# Patient Record
Sex: Female | Born: 1940
Health system: Southern US, Community
[De-identification: ages and names within clinical notes are randomized; demographics above are authoritative.]

## PROBLEM LIST (undated history)

## (undated) DIAGNOSIS — F419 Anxiety disorder, unspecified: Secondary | ICD-10-CM

## (undated) DIAGNOSIS — F329 Major depressive disorder, single episode, unspecified: Secondary | ICD-10-CM

## (undated) DIAGNOSIS — J189 Pneumonia, unspecified organism: Secondary | ICD-10-CM

## (undated) DIAGNOSIS — K449 Diaphragmatic hernia without obstruction or gangrene: Secondary | ICD-10-CM

## (undated) DIAGNOSIS — E785 Hyperlipidemia, unspecified: Secondary | ICD-10-CM

## (undated) DIAGNOSIS — G4730 Sleep apnea, unspecified: Secondary | ICD-10-CM

## (undated) DIAGNOSIS — R053 Chronic cough: Secondary | ICD-10-CM

## (undated) DIAGNOSIS — E669 Obesity, unspecified: Secondary | ICD-10-CM

## (undated) DIAGNOSIS — F32A Depression, unspecified: Secondary | ICD-10-CM

## (undated) DIAGNOSIS — E039 Hypothyroidism, unspecified: Secondary | ICD-10-CM

## (undated) DIAGNOSIS — R911 Solitary pulmonary nodule: Secondary | ICD-10-CM

## (undated) DIAGNOSIS — I10 Essential (primary) hypertension: Secondary | ICD-10-CM

## (undated) DIAGNOSIS — Z91018 Allergy to other foods: Secondary | ICD-10-CM

## (undated) DIAGNOSIS — R05 Cough: Secondary | ICD-10-CM

## (undated) DIAGNOSIS — E119 Type 2 diabetes mellitus without complications: Secondary | ICD-10-CM

## (undated) DIAGNOSIS — K219 Gastro-esophageal reflux disease without esophagitis: Secondary | ICD-10-CM

## (undated) DIAGNOSIS — K635 Polyp of colon: Secondary | ICD-10-CM

## (undated) DIAGNOSIS — M1990 Unspecified osteoarthritis, unspecified site: Secondary | ICD-10-CM

## (undated) DIAGNOSIS — K5790 Diverticulosis of intestine, part unspecified, without perforation or abscess without bleeding: Secondary | ICD-10-CM

## (undated) DIAGNOSIS — R079 Chest pain, unspecified: Secondary | ICD-10-CM

## (undated) DIAGNOSIS — M2550 Pain in unspecified joint: Secondary | ICD-10-CM

## (undated) DIAGNOSIS — R0602 Shortness of breath: Secondary | ICD-10-CM

## (undated) HISTORY — DX: Pneumonia, unspecified organism: J18.9

## (undated) HISTORY — DX: Sleep apnea, unspecified: G47.30

## (undated) HISTORY — DX: Pain in unspecified joint: M25.50

## (undated) HISTORY — DX: Unspecified osteoarthritis, unspecified site: M19.90

## (undated) HISTORY — DX: Chronic cough: R05.3

## (undated) HISTORY — DX: Depression, unspecified: F32.A

## (undated) HISTORY — PX: ABDOMINAL HYSTERECTOMY: SHX81

## (undated) HISTORY — PX: CHOLECYSTECTOMY: SHX55

## (undated) HISTORY — DX: Major depressive disorder, single episode, unspecified: F32.9

## (undated) HISTORY — DX: Obesity, unspecified: E66.9

## (undated) HISTORY — DX: Gastro-esophageal reflux disease without esophagitis: K21.9

## (undated) HISTORY — DX: Polyp of colon: K63.5

## (undated) HISTORY — DX: Type 2 diabetes mellitus without complications: E11.9

## (undated) HISTORY — DX: Allergy to other foods: Z91.018

## (undated) HISTORY — PX: NISSEN FUNDOPLICATION: SHX2091

## (undated) HISTORY — DX: Cough: R05

## (undated) HISTORY — DX: Solitary pulmonary nodule: R91.1

## (undated) HISTORY — DX: Anxiety disorder, unspecified: F41.9

## (undated) HISTORY — DX: Shortness of breath: R06.02

## (undated) HISTORY — PX: BREAST EXCISIONAL BIOPSY: SUR124

## (undated) HISTORY — DX: Diaphragmatic hernia without obstruction or gangrene: K44.9

## (undated) HISTORY — DX: Hypothyroidism, unspecified: E03.9

## (undated) HISTORY — DX: Chest pain, unspecified: R07.9

## (undated) HISTORY — PX: APPENDECTOMY: SHX54

## (undated) HISTORY — DX: Diverticulosis of intestine, part unspecified, without perforation or abscess without bleeding: K57.90

## (undated) HISTORY — PX: OTHER SURGICAL HISTORY: SHX169

## (undated) HISTORY — PX: VENTRAL HERNIA REPAIR: SHX424

## (undated) HISTORY — DX: Essential (primary) hypertension: I10

## (undated) HISTORY — PX: BLADDER SUSPENSION: SHX72

## (undated) HISTORY — DX: Hyperlipidemia, unspecified: E78.5

## (undated) HISTORY — PX: RECTOCELE REPAIR: SHX761

## (undated) HISTORY — PX: TONSILLECTOMY: SUR1361

---

## 2004-02-01 ENCOUNTER — Ambulatory Visit: Payer: Self-pay | Admitting: Family Medicine

## 2004-02-07 ENCOUNTER — Encounter: Admission: RE | Admit: 2004-02-07 | Discharge: 2004-03-07 | Payer: Self-pay | Admitting: Family Medicine

## 2004-02-11 ENCOUNTER — Ambulatory Visit: Payer: Self-pay | Admitting: Internal Medicine

## 2004-03-15 ENCOUNTER — Ambulatory Visit: Payer: Self-pay | Admitting: Internal Medicine

## 2004-03-27 ENCOUNTER — Ambulatory Visit: Payer: Self-pay | Admitting: Professional

## 2004-04-03 ENCOUNTER — Ambulatory Visit: Payer: Self-pay | Admitting: Professional

## 2004-04-13 ENCOUNTER — Ambulatory Visit: Payer: Self-pay | Admitting: Professional

## 2004-04-17 ENCOUNTER — Ambulatory Visit: Payer: Self-pay | Admitting: Professional

## 2004-04-25 ENCOUNTER — Ambulatory Visit: Payer: Self-pay | Admitting: Family Medicine

## 2004-04-25 ENCOUNTER — Ambulatory Visit: Payer: Self-pay | Admitting: Professional

## 2004-05-01 ENCOUNTER — Ambulatory Visit: Payer: Self-pay | Admitting: Professional

## 2004-05-03 ENCOUNTER — Ambulatory Visit: Payer: Self-pay | Admitting: Family Medicine

## 2004-05-05 ENCOUNTER — Ambulatory Visit: Payer: Self-pay | Admitting: Family Medicine

## 2004-07-20 ENCOUNTER — Ambulatory Visit: Payer: Self-pay | Admitting: Professional

## 2004-07-24 ENCOUNTER — Ambulatory Visit: Payer: Self-pay | Admitting: Professional

## 2004-08-03 ENCOUNTER — Ambulatory Visit: Payer: Self-pay | Admitting: Professional

## 2004-08-07 ENCOUNTER — Ambulatory Visit: Payer: Self-pay | Admitting: Professional

## 2004-08-21 ENCOUNTER — Ambulatory Visit: Payer: Self-pay | Admitting: Professional

## 2004-08-28 ENCOUNTER — Ambulatory Visit: Payer: Self-pay | Admitting: Professional

## 2004-09-04 ENCOUNTER — Ambulatory Visit: Payer: Self-pay | Admitting: Professional

## 2004-09-11 ENCOUNTER — Ambulatory Visit: Payer: Self-pay | Admitting: Professional

## 2004-09-26 ENCOUNTER — Ambulatory Visit: Payer: Self-pay | Admitting: Professional

## 2004-09-26 ENCOUNTER — Ambulatory Visit: Payer: Self-pay | Admitting: Family Medicine

## 2004-10-03 ENCOUNTER — Ambulatory Visit: Payer: Self-pay | Admitting: Family Medicine

## 2004-10-16 ENCOUNTER — Ambulatory Visit: Payer: Self-pay | Admitting: Professional

## 2004-11-06 ENCOUNTER — Ambulatory Visit: Payer: Self-pay | Admitting: Professional

## 2004-11-27 ENCOUNTER — Ambulatory Visit: Payer: Self-pay | Admitting: Internal Medicine

## 2004-12-08 ENCOUNTER — Ambulatory Visit: Payer: Self-pay | Admitting: Internal Medicine

## 2004-12-21 ENCOUNTER — Ambulatory Visit: Payer: Self-pay | Admitting: Internal Medicine

## 2005-01-11 ENCOUNTER — Encounter: Admission: RE | Admit: 2005-01-11 | Discharge: 2005-01-11 | Payer: Self-pay | Admitting: Internal Medicine

## 2005-01-22 ENCOUNTER — Ambulatory Visit: Payer: Self-pay | Admitting: Internal Medicine

## 2005-01-22 ENCOUNTER — Ambulatory Visit: Payer: Self-pay | Admitting: Professional

## 2005-03-06 ENCOUNTER — Ambulatory Visit: Payer: Self-pay | Admitting: Internal Medicine

## 2005-03-26 ENCOUNTER — Ambulatory Visit: Payer: Self-pay | Admitting: Internal Medicine

## 2005-03-30 ENCOUNTER — Ambulatory Visit: Payer: Self-pay | Admitting: Internal Medicine

## 2005-04-03 ENCOUNTER — Ambulatory Visit: Payer: Self-pay | Admitting: Internal Medicine

## 2005-04-12 ENCOUNTER — Encounter: Admission: RE | Admit: 2005-04-12 | Discharge: 2005-07-11 | Payer: Self-pay | Admitting: Internal Medicine

## 2005-06-14 ENCOUNTER — Ambulatory Visit: Payer: Self-pay | Admitting: Internal Medicine

## 2005-06-14 ENCOUNTER — Encounter (INDEPENDENT_AMBULATORY_CARE_PROVIDER_SITE_OTHER): Payer: Self-pay | Admitting: Specialist

## 2005-06-14 ENCOUNTER — Ambulatory Visit (HOSPITAL_COMMUNITY): Admission: RE | Admit: 2005-06-14 | Discharge: 2005-06-14 | Payer: Self-pay | Admitting: Internal Medicine

## 2005-06-14 ENCOUNTER — Inpatient Hospital Stay (HOSPITAL_COMMUNITY): Admission: EM | Admit: 2005-06-14 | Discharge: 2005-06-16 | Payer: Self-pay | Admitting: Emergency Medicine

## 2005-07-11 ENCOUNTER — Encounter: Admission: RE | Admit: 2005-07-11 | Discharge: 2005-07-11 | Payer: Self-pay | Admitting: Surgery

## 2005-08-04 ENCOUNTER — Emergency Department (HOSPITAL_COMMUNITY): Admission: EM | Admit: 2005-08-04 | Discharge: 2005-08-04 | Payer: Self-pay | Admitting: Emergency Medicine

## 2005-08-16 ENCOUNTER — Ambulatory Visit: Payer: Self-pay | Admitting: Internal Medicine

## 2005-09-04 ENCOUNTER — Ambulatory Visit: Payer: Self-pay | Admitting: Internal Medicine

## 2005-09-25 ENCOUNTER — Ambulatory Visit: Payer: Self-pay | Admitting: Internal Medicine

## 2005-10-25 ENCOUNTER — Ambulatory Visit: Payer: Self-pay | Admitting: Internal Medicine

## 2005-11-16 ENCOUNTER — Ambulatory Visit: Payer: Self-pay | Admitting: Internal Medicine

## 2006-01-07 ENCOUNTER — Ambulatory Visit: Payer: Self-pay | Admitting: Internal Medicine

## 2006-01-15 ENCOUNTER — Encounter: Admission: RE | Admit: 2006-01-15 | Discharge: 2006-01-15 | Payer: Self-pay | Admitting: Internal Medicine

## 2006-04-10 ENCOUNTER — Ambulatory Visit: Payer: Self-pay | Admitting: Internal Medicine

## 2006-04-10 LAB — CONVERTED CEMR LAB
CO2: 33 meq/L — ABNORMAL HIGH (ref 19–32)
Calcium: 9.7 mg/dL (ref 8.4–10.5)
Chloride: 105 meq/L (ref 96–112)
Eosinophils Absolute: 0.2 10*3/uL (ref 0.0–0.6)
Eosinophils Relative: 3 % (ref 0.0–5.0)
Glucose, Bld: 115 mg/dL — ABNORMAL HIGH (ref 70–99)
HCT: 35.4 % — ABNORMAL LOW (ref 36.0–46.0)
Lymphocytes Relative: 38.9 % (ref 12.0–46.0)
MCV: 88.2 fL (ref 78.0–100.0)
Monocytes Absolute: 0.5 10*3/uL (ref 0.2–0.7)
Neutro Abs: 3.1 10*3/uL (ref 1.4–7.7)
Neutrophils Relative %: 47.9 % (ref 43.0–77.0)
WBC: 6.5 10*3/uL (ref 4.5–10.5)

## 2006-04-19 ENCOUNTER — Encounter (INDEPENDENT_AMBULATORY_CARE_PROVIDER_SITE_OTHER): Payer: Self-pay | Admitting: *Deleted

## 2006-04-19 ENCOUNTER — Encounter: Admission: RE | Admit: 2006-04-19 | Discharge: 2006-04-19 | Payer: Self-pay | Admitting: Surgery

## 2006-06-06 ENCOUNTER — Encounter (INDEPENDENT_AMBULATORY_CARE_PROVIDER_SITE_OTHER): Payer: Self-pay | Admitting: *Deleted

## 2006-06-06 ENCOUNTER — Ambulatory Visit (HOSPITAL_COMMUNITY): Admission: RE | Admit: 2006-06-06 | Discharge: 2006-06-07 | Payer: Self-pay | Admitting: Surgery

## 2006-07-30 ENCOUNTER — Ambulatory Visit: Payer: Self-pay | Admitting: Internal Medicine

## 2006-08-27 ENCOUNTER — Ambulatory Visit: Payer: Self-pay | Admitting: Internal Medicine

## 2006-08-28 ENCOUNTER — Encounter: Admission: RE | Admit: 2006-08-28 | Discharge: 2006-09-17 | Payer: Self-pay | Admitting: Orthopedic Surgery

## 2006-09-02 ENCOUNTER — Ambulatory Visit: Payer: Self-pay | Admitting: Internal Medicine

## 2006-10-01 ENCOUNTER — Ambulatory Visit: Payer: Self-pay | Admitting: Internal Medicine

## 2006-10-01 ENCOUNTER — Encounter: Payer: Self-pay | Admitting: Internal Medicine

## 2006-10-01 LAB — CONVERTED CEMR LAB
Albumin: 4 g/dL (ref 3.5–5.2)
Basophils Absolute: 0.3 10*3/uL — ABNORMAL HIGH (ref 0.0–0.1)
Bilirubin, Direct: 0.1 mg/dL (ref 0.0–0.3)
Chloride: 107 meq/L (ref 96–112)
Cholesterol: 197 mg/dL (ref 0–200)
Eosinophils Absolute: 0.2 10*3/uL (ref 0.0–0.6)
GFR calc Af Amer: 71 mL/min
GFR calc non Af Amer: 59 mL/min
Glucose, Bld: 103 mg/dL — ABNORMAL HIGH (ref 70–99)
HCT: 34.7 % — ABNORMAL LOW (ref 36.0–46.0)
HDL: 52 mg/dL (ref >39.0)
Hemoglobin, Urine: NEGATIVE
Ketones, ur: NEGATIVE mg/dL
LDL Cholesterol: 130 mg/dL — ABNORMAL HIGH (ref 0–99)
Lymphocytes Relative: 38 % (ref 12.0–46.0)
MCHC: 34 g/dL (ref 30.0–36.0)
MCV: 90.6 fL (ref 78.0–100.0)
Monocytes Absolute: 0.5 10*3/uL (ref 0.2–0.7)
Neutro Abs: 2.8 10*3/uL (ref 1.4–7.7)
Neutrophils Relative %: 45.2 % (ref 43.0–77.0)
Potassium: 3.4 meq/L — ABNORMAL LOW (ref 3.5–5.1)
RBC: 3.83 M/uL — ABNORMAL LOW (ref 3.87–5.11)
Sodium: 147 meq/L — ABNORMAL HIGH (ref 135–145)
TSH: 4.91 microintl units/mL (ref 0.35–5.50)
Total CHOL/HDL Ratio: 3.8
Urobilinogen, UA: 0.2 (ref 0.0–1.0)

## 2006-10-16 ENCOUNTER — Ambulatory Visit: Payer: Self-pay | Admitting: Internal Medicine

## 2006-11-19 ENCOUNTER — Encounter: Admission: RE | Admit: 2006-11-19 | Discharge: 2006-11-19 | Payer: Self-pay | Admitting: Orthopedic Surgery

## 2006-11-30 ENCOUNTER — Encounter: Payer: Self-pay | Admitting: Internal Medicine

## 2006-12-21 DIAGNOSIS — E039 Hypothyroidism, unspecified: Secondary | ICD-10-CM

## 2006-12-21 DIAGNOSIS — I10 Essential (primary) hypertension: Secondary | ICD-10-CM

## 2007-01-16 ENCOUNTER — Telehealth: Payer: Self-pay | Admitting: Internal Medicine

## 2007-01-20 ENCOUNTER — Encounter: Payer: Self-pay | Admitting: Internal Medicine

## 2007-01-20 ENCOUNTER — Encounter: Admission: RE | Admit: 2007-01-20 | Discharge: 2007-01-20 | Payer: Self-pay | Admitting: Internal Medicine

## 2007-01-28 ENCOUNTER — Ambulatory Visit: Payer: Self-pay | Admitting: Internal Medicine

## 2007-02-19 ENCOUNTER — Ambulatory Visit: Payer: Self-pay | Admitting: Internal Medicine

## 2007-02-19 DIAGNOSIS — E785 Hyperlipidemia, unspecified: Secondary | ICD-10-CM

## 2007-02-19 DIAGNOSIS — J309 Allergic rhinitis, unspecified: Secondary | ICD-10-CM

## 2007-02-19 DIAGNOSIS — E118 Type 2 diabetes mellitus with unspecified complications: Secondary | ICD-10-CM | POA: Insufficient documentation

## 2007-02-19 LAB — CONVERTED CEMR LAB
CO2: 32 meq/L (ref 19–32)
Cholesterol: 189 mg/dL (ref 0–200)
Creatinine, Ser: 0.8 mg/dL (ref 0.4–1.2)
GFR calc Af Amer: 92 mL/min
Glucose, Bld: 104 mg/dL — ABNORMAL HIGH (ref 70–99)
HDL: 62.5 mg/dL (ref >39.0)
Potassium: 4.1 meq/L (ref 3.5–5.1)
Total CHOL/HDL Ratio: 3
Triglycerides: 66 mg/dL (ref 0–149)

## 2007-02-21 ENCOUNTER — Encounter: Payer: Self-pay | Admitting: Internal Medicine

## 2007-03-10 ENCOUNTER — Telehealth: Payer: Self-pay | Admitting: Internal Medicine

## 2007-04-09 ENCOUNTER — Ambulatory Visit: Payer: Self-pay | Admitting: Internal Medicine

## 2007-04-09 DIAGNOSIS — F329 Major depressive disorder, single episode, unspecified: Secondary | ICD-10-CM

## 2007-04-10 ENCOUNTER — Encounter (INDEPENDENT_AMBULATORY_CARE_PROVIDER_SITE_OTHER): Payer: Self-pay | Admitting: *Deleted

## 2007-05-06 ENCOUNTER — Telehealth: Payer: Self-pay | Admitting: Internal Medicine

## 2007-05-13 ENCOUNTER — Ambulatory Visit: Payer: Self-pay | Admitting: Pulmonary Disease

## 2007-05-22 DIAGNOSIS — G4733 Obstructive sleep apnea (adult) (pediatric): Secondary | ICD-10-CM

## 2007-05-28 ENCOUNTER — Encounter: Payer: Self-pay | Admitting: Pulmonary Disease

## 2007-06-03 ENCOUNTER — Encounter: Payer: Self-pay | Admitting: Internal Medicine

## 2007-06-06 ENCOUNTER — Ambulatory Visit: Payer: Self-pay | Admitting: Internal Medicine

## 2007-06-13 ENCOUNTER — Telehealth (INDEPENDENT_AMBULATORY_CARE_PROVIDER_SITE_OTHER): Payer: Self-pay | Admitting: *Deleted

## 2007-07-14 ENCOUNTER — Ambulatory Visit: Payer: Self-pay | Admitting: Pulmonary Disease

## 2007-08-22 ENCOUNTER — Telehealth: Payer: Self-pay | Admitting: Internal Medicine

## 2007-08-22 ENCOUNTER — Ambulatory Visit: Payer: Self-pay | Admitting: Internal Medicine

## 2007-09-02 ENCOUNTER — Encounter: Payer: Self-pay | Admitting: Internal Medicine

## 2007-09-03 ENCOUNTER — Encounter: Payer: Self-pay | Admitting: Pulmonary Disease

## 2007-10-15 ENCOUNTER — Telehealth: Payer: Self-pay | Admitting: Internal Medicine

## 2007-10-20 ENCOUNTER — Ambulatory Visit: Payer: Self-pay | Admitting: Internal Medicine

## 2007-10-20 LAB — CONVERTED CEMR LAB
Albumin: 3.9 g/dL (ref 3.5–5.2)
Alkaline Phosphatase: 58 units/L (ref 39–117)
Bilirubin, Direct: 0.1 mg/dL (ref 0.0–0.3)
LDL Cholesterol: 130 mg/dL — ABNORMAL HIGH (ref 0–99)
Total CHOL/HDL Ratio: 3.4
Total Protein: 7.3 g/dL (ref 6.0–8.3)
VLDL: 10 mg/dL (ref 0–40)

## 2007-10-21 ENCOUNTER — Encounter: Payer: Self-pay | Admitting: Internal Medicine

## 2007-10-27 ENCOUNTER — Ambulatory Visit: Payer: Self-pay | Admitting: Internal Medicine

## 2007-10-27 DIAGNOSIS — E1149 Type 2 diabetes mellitus with other diabetic neurological complication: Secondary | ICD-10-CM | POA: Insufficient documentation

## 2007-10-27 DIAGNOSIS — F411 Generalized anxiety disorder: Secondary | ICD-10-CM | POA: Insufficient documentation

## 2007-10-27 DIAGNOSIS — R519 Headache, unspecified: Secondary | ICD-10-CM | POA: Insufficient documentation

## 2007-10-27 DIAGNOSIS — R51 Headache: Secondary | ICD-10-CM

## 2007-10-30 ENCOUNTER — Telehealth: Payer: Self-pay | Admitting: Internal Medicine

## 2007-12-15 ENCOUNTER — Ambulatory Visit: Payer: Self-pay | Admitting: Internal Medicine

## 2007-12-19 ENCOUNTER — Telehealth: Payer: Self-pay | Admitting: Internal Medicine

## 2008-01-07 ENCOUNTER — Ambulatory Visit: Payer: Self-pay | Admitting: Internal Medicine

## 2008-01-08 ENCOUNTER — Telehealth: Payer: Self-pay | Admitting: Family Medicine

## 2008-01-20 ENCOUNTER — Encounter: Payer: Self-pay | Admitting: Internal Medicine

## 2008-02-10 ENCOUNTER — Encounter: Admission: RE | Admit: 2008-02-10 | Discharge: 2008-02-10 | Payer: Self-pay | Admitting: Internal Medicine

## 2008-02-17 ENCOUNTER — Ambulatory Visit: Payer: Self-pay | Admitting: Internal Medicine

## 2008-02-17 LAB — CONVERTED CEMR LAB
ALT: 24 units/L (ref 0–35)
AST: 23 units/L (ref 0–37)
Alkaline Phosphatase: 66 units/L (ref 39–117)
Basophils Relative: 0.8 % (ref 0.0–3.0)
Bilirubin, Direct: 0.1 mg/dL (ref 0.0–0.3)
CO2: 32 meq/L (ref 19–32)
Calcium: 9.4 mg/dL (ref 8.4–10.5)
Chloride: 99 meq/L (ref 96–112)
Eosinophils Relative: 0.8 % (ref 0.0–5.0)
Folate: >20 ng/mL
Glucose, Bld: 74 mg/dL (ref 70–99)
Hemoglobin, Urine: NEGATIVE
Lymphocytes Relative: 15.6 % (ref 12.0–46.0)
MCV: 90.4 fL (ref 78.0–100.0)
Monocytes Relative: 9 % (ref 3.0–12.0)
Neutrophils Relative %: 73.8 % (ref 43.0–77.0)
Nitrite: NEGATIVE
Platelets: 293 10*3/uL (ref 150–400)
Potassium: 3.5 meq/L (ref 3.5–5.1)
RBC: 3.86 M/uL — ABNORMAL LOW (ref 3.87–5.11)
Sodium: 139 meq/L (ref 135–145)
Specific Gravity, Urine: 1.01 (ref 1.000–1.03)
TSH: 4.06 microintl units/mL (ref 0.35–5.50)
Total Bilirubin: 0.6 mg/dL (ref 0.3–1.2)
Total Protein: 7.4 g/dL (ref 6.0–8.3)
Transferrin: 253.8 mg/dL (ref >=212.0)
Urine Glucose: NEGATIVE mg/dL
Urobilinogen, UA: 0.2 (ref 0.0–1.0)
WBC: 10.6 10*3/uL — ABNORMAL HIGH (ref 4.5–10.5)

## 2008-02-18 ENCOUNTER — Encounter: Payer: Self-pay | Admitting: Internal Medicine

## 2008-02-20 ENCOUNTER — Ambulatory Visit: Payer: Self-pay | Admitting: Internal Medicine

## 2008-02-20 ENCOUNTER — Encounter: Payer: Self-pay | Admitting: Internal Medicine

## 2008-02-20 ENCOUNTER — Telehealth: Payer: Self-pay | Admitting: Internal Medicine

## 2008-02-20 DIAGNOSIS — R911 Solitary pulmonary nodule: Secondary | ICD-10-CM | POA: Insufficient documentation

## 2008-02-24 ENCOUNTER — Telehealth: Payer: Self-pay | Admitting: Internal Medicine

## 2008-02-27 ENCOUNTER — Encounter: Payer: Self-pay | Admitting: Internal Medicine

## 2008-02-27 ENCOUNTER — Ambulatory Visit: Payer: Self-pay | Admitting: Cardiovascular Disease

## 2008-04-15 ENCOUNTER — Telehealth (INDEPENDENT_AMBULATORY_CARE_PROVIDER_SITE_OTHER): Payer: Self-pay | Admitting: *Deleted

## 2008-05-20 ENCOUNTER — Encounter: Payer: Self-pay | Admitting: Internal Medicine

## 2008-05-20 ENCOUNTER — Ambulatory Visit: Payer: Self-pay | Admitting: Internal Medicine

## 2008-05-20 LAB — CONVERTED CEMR LAB
Albumin: 3.9 g/dL (ref 3.5–5.2)
Alkaline Phosphatase: 55 units/L (ref 39–117)
Cholesterol: 226 mg/dL (ref 0–200)
Hgb A1c MFr Bld: 6.4 % — ABNORMAL HIGH (ref 4.6–6.0)
Total Protein: 7.1 g/dL (ref 6.0–8.3)
Triglycerides: 80 mg/dL (ref 0–149)
VLDL: 16 mg/dL (ref 0–40)

## 2008-05-24 ENCOUNTER — Telehealth (INDEPENDENT_AMBULATORY_CARE_PROVIDER_SITE_OTHER): Payer: Self-pay | Admitting: *Deleted

## 2008-05-26 ENCOUNTER — Ambulatory Visit: Payer: Self-pay | Admitting: Internal Medicine

## 2008-05-26 LAB — CONVERTED CEMR LAB: Cholesterol, target level: 200 mg/dL

## 2008-06-03 ENCOUNTER — Telehealth: Payer: Self-pay | Admitting: Internal Medicine

## 2008-06-15 ENCOUNTER — Telehealth: Payer: Self-pay | Admitting: Internal Medicine

## 2008-06-23 ENCOUNTER — Encounter: Payer: Self-pay | Admitting: Internal Medicine

## 2008-06-24 ENCOUNTER — Ambulatory Visit: Payer: Self-pay | Admitting: Internal Medicine

## 2008-06-24 ENCOUNTER — Telehealth: Payer: Self-pay | Admitting: Internal Medicine

## 2008-07-26 ENCOUNTER — Telehealth (INDEPENDENT_AMBULATORY_CARE_PROVIDER_SITE_OTHER): Payer: Self-pay | Admitting: *Deleted

## 2008-08-06 ENCOUNTER — Ambulatory Visit: Payer: Self-pay | Admitting: Internal Medicine

## 2008-08-13 ENCOUNTER — Ambulatory Visit: Payer: Self-pay | Admitting: Critical Care Medicine

## 2008-09-13 ENCOUNTER — Ambulatory Visit: Payer: Self-pay | Admitting: Internal Medicine

## 2008-09-22 ENCOUNTER — Ambulatory Visit: Payer: Self-pay | Admitting: Internal Medicine

## 2008-09-23 ENCOUNTER — Ambulatory Visit: Payer: Self-pay | Admitting: Internal Medicine

## 2008-09-24 LAB — CONVERTED CEMR LAB
Basophils Relative: 0.7 % (ref 0.0–3.0)
Eosinophils Relative: 1.2 % (ref 0.0–5.0)
HCT: 35.9 % — ABNORMAL LOW (ref 36.0–46.0)
Lymphs Abs: 2.3 10*3/uL (ref 0.7–4.0)
MCHC: 34.7 g/dL (ref 30.0–36.0)
MCV: 90.2 fL (ref 78.0–100.0)
Monocytes Absolute: 0.5 10*3/uL (ref 0.1–1.0)
Platelets: 338 10*3/uL (ref 150.0–400.0)
RBC: 3.98 M/uL (ref 3.87–5.11)
WBC: 7.9 10*3/uL (ref 4.5–10.5)

## 2008-10-18 ENCOUNTER — Telehealth (INDEPENDENT_AMBULATORY_CARE_PROVIDER_SITE_OTHER): Payer: Self-pay | Admitting: *Deleted

## 2008-10-21 ENCOUNTER — Telehealth (INDEPENDENT_AMBULATORY_CARE_PROVIDER_SITE_OTHER): Payer: Self-pay | Admitting: *Deleted

## 2008-10-21 DIAGNOSIS — N644 Mastodynia: Secondary | ICD-10-CM

## 2008-10-27 ENCOUNTER — Ambulatory Visit: Payer: Self-pay | Admitting: Internal Medicine

## 2008-11-09 ENCOUNTER — Encounter: Payer: Self-pay | Admitting: Internal Medicine

## 2008-11-26 ENCOUNTER — Telehealth: Payer: Self-pay | Admitting: Internal Medicine

## 2008-12-03 ENCOUNTER — Ambulatory Visit: Payer: Self-pay | Admitting: Internal Medicine

## 2008-12-03 DIAGNOSIS — K219 Gastro-esophageal reflux disease without esophagitis: Secondary | ICD-10-CM

## 2008-12-16 ENCOUNTER — Ambulatory Visit: Payer: Self-pay | Admitting: Internal Medicine

## 2008-12-16 DIAGNOSIS — M25569 Pain in unspecified knee: Secondary | ICD-10-CM

## 2008-12-16 DIAGNOSIS — G8929 Other chronic pain: Secondary | ICD-10-CM | POA: Insufficient documentation

## 2008-12-16 LAB — CONVERTED CEMR LAB
Basophils Absolute: 0.1 10*3/uL (ref 0.0–0.1)
Calcium: 9.2 mg/dL (ref 8.4–10.5)
Eosinophils Absolute: 0.1 10*3/uL (ref 0.0–0.7)
GFR calc non Af Amer: 91.32 mL/min (ref >60)
Glucose, Bld: 105 mg/dL — ABNORMAL HIGH (ref 70–99)
Hgb A1c MFr Bld: 6.2 % (ref 4.6–6.5)
MCHC: 33.7 g/dL (ref 30.0–36.0)
MCV: 92.3 fL (ref 78.0–100.0)
Monocytes Absolute: 0.7 10*3/uL (ref 0.1–1.0)
Neutrophils Relative %: 62.1 % (ref 43.0–77.0)
Platelets: 318 10*3/uL (ref 150.0–400.0)
RDW: 13.6 % (ref 11.5–14.6)
Rhuematoid fact SerPl-aCnc: 21.6 intl units/mL — ABNORMAL HIGH (ref 0.0–20.0)
Sodium: 138 meq/L (ref 135–145)

## 2008-12-20 ENCOUNTER — Telehealth: Payer: Self-pay | Admitting: Internal Medicine

## 2008-12-23 ENCOUNTER — Telehealth: Payer: Self-pay | Admitting: Internal Medicine

## 2009-01-07 ENCOUNTER — Telehealth: Payer: Self-pay | Admitting: Internal Medicine

## 2009-01-12 ENCOUNTER — Ambulatory Visit: Payer: Self-pay | Admitting: Internal Medicine

## 2009-01-12 ENCOUNTER — Encounter: Admission: RE | Admit: 2009-01-12 | Discharge: 2009-01-12 | Payer: Self-pay | Admitting: Internal Medicine

## 2009-01-12 DIAGNOSIS — M129 Arthropathy, unspecified: Secondary | ICD-10-CM | POA: Insufficient documentation

## 2009-01-12 DIAGNOSIS — D126 Benign neoplasm of colon, unspecified: Secondary | ICD-10-CM | POA: Insufficient documentation

## 2009-01-12 LAB — CONVERTED CEMR LAB
Alkaline Phosphatase: 73 units/L (ref 39–117)
Bilirubin, Direct: 0 mg/dL (ref 0.0–0.3)
Cholesterol: 191 mg/dL (ref 0–200)
LDL Cholesterol: 116 mg/dL — ABNORMAL HIGH (ref 0–99)
Total CHOL/HDL Ratio: 3
Total Protein: 7 g/dL (ref 6.0–8.3)

## 2009-01-24 ENCOUNTER — Encounter: Payer: Self-pay | Admitting: Internal Medicine

## 2009-02-03 ENCOUNTER — Ambulatory Visit: Payer: Self-pay | Admitting: Internal Medicine

## 2009-02-14 ENCOUNTER — Telehealth: Payer: Self-pay | Admitting: Internal Medicine

## 2009-02-15 ENCOUNTER — Encounter: Payer: Self-pay | Admitting: Internal Medicine

## 2009-02-17 ENCOUNTER — Ambulatory Visit (HOSPITAL_COMMUNITY): Admission: RE | Admit: 2009-02-17 | Discharge: 2009-02-17 | Payer: Self-pay | Admitting: Internal Medicine

## 2009-02-23 ENCOUNTER — Ambulatory Visit: Payer: Self-pay | Admitting: Internal Medicine

## 2009-02-23 DIAGNOSIS — H6500 Acute serous otitis media, unspecified ear: Secondary | ICD-10-CM

## 2009-03-04 ENCOUNTER — Ambulatory Visit: Payer: Self-pay | Admitting: Internal Medicine

## 2009-03-22 ENCOUNTER — Ambulatory Visit (HOSPITAL_COMMUNITY): Admission: RE | Admit: 2009-03-22 | Discharge: 2009-03-22 | Payer: Self-pay | Admitting: Internal Medicine

## 2009-03-30 ENCOUNTER — Telehealth: Payer: Self-pay | Admitting: Internal Medicine

## 2009-04-11 ENCOUNTER — Telehealth: Payer: Self-pay | Admitting: Internal Medicine

## 2009-04-19 ENCOUNTER — Ambulatory Visit: Payer: Self-pay | Admitting: Internal Medicine

## 2009-04-25 ENCOUNTER — Telehealth: Payer: Self-pay | Admitting: Internal Medicine

## 2009-04-25 ENCOUNTER — Ambulatory Visit: Payer: Self-pay | Admitting: Internal Medicine

## 2009-05-17 ENCOUNTER — Telehealth: Payer: Self-pay | Admitting: Internal Medicine

## 2009-05-18 ENCOUNTER — Encounter: Payer: Self-pay | Admitting: Internal Medicine

## 2009-05-25 ENCOUNTER — Encounter: Payer: Self-pay | Admitting: Internal Medicine

## 2009-05-30 ENCOUNTER — Telehealth: Payer: Self-pay | Admitting: Internal Medicine

## 2009-06-02 ENCOUNTER — Ambulatory Visit: Payer: Self-pay | Admitting: Internal Medicine

## 2009-06-02 LAB — CONVERTED CEMR LAB
Basophils Relative: 0.5 % (ref 0.0–3.0)
CO2: 32 meq/L (ref 19–32)
Chloride: 97 meq/L (ref 96–112)
Eosinophils Absolute: 0.1 10*3/uL (ref 0.0–0.7)
Hemoglobin: 11.5 g/dL — ABNORMAL LOW (ref 12.0–15.0)
MCHC: 33 g/dL (ref 30.0–36.0)
MCV: 90.9 fL (ref 78.0–100.0)
Monocytes Absolute: 0.6 10*3/uL (ref 0.1–1.0)
Neutro Abs: 4.3 10*3/uL (ref 1.4–7.7)
RBC: 3.83 M/uL — ABNORMAL LOW (ref 3.87–5.11)
Sodium: 138 meq/L (ref 135–145)
TSH: 1.2 microintl units/mL (ref 0.35–5.50)

## 2009-06-03 ENCOUNTER — Encounter: Payer: Self-pay | Admitting: Internal Medicine

## 2009-06-07 ENCOUNTER — Telehealth: Payer: Self-pay | Admitting: Internal Medicine

## 2009-06-08 ENCOUNTER — Encounter: Payer: Self-pay | Admitting: Internal Medicine

## 2009-06-22 ENCOUNTER — Telehealth: Payer: Self-pay | Admitting: Internal Medicine

## 2009-06-23 ENCOUNTER — Telehealth: Payer: Self-pay | Admitting: Internal Medicine

## 2009-06-27 ENCOUNTER — Ambulatory Visit (HOSPITAL_COMMUNITY): Admission: RE | Admit: 2009-06-27 | Discharge: 2009-06-29 | Payer: Self-pay | Admitting: Surgery

## 2009-07-01 ENCOUNTER — Telehealth: Payer: Self-pay | Admitting: Internal Medicine

## 2009-07-11 ENCOUNTER — Encounter: Payer: Self-pay | Admitting: Internal Medicine

## 2009-07-11 ENCOUNTER — Encounter: Admission: RE | Admit: 2009-07-11 | Discharge: 2009-07-11 | Payer: Self-pay | Admitting: Surgery

## 2009-07-22 ENCOUNTER — Encounter: Payer: Self-pay | Admitting: Internal Medicine

## 2009-07-26 ENCOUNTER — Ambulatory Visit: Payer: Self-pay | Admitting: Internal Medicine

## 2009-08-01 ENCOUNTER — Telehealth: Payer: Self-pay | Admitting: Internal Medicine

## 2009-08-02 ENCOUNTER — Ambulatory Visit: Payer: Self-pay | Admitting: Internal Medicine

## 2009-08-04 ENCOUNTER — Telehealth: Payer: Self-pay | Admitting: Internal Medicine

## 2009-08-04 DIAGNOSIS — R7989 Other specified abnormal findings of blood chemistry: Secondary | ICD-10-CM | POA: Insufficient documentation

## 2009-08-08 ENCOUNTER — Telehealth: Payer: Self-pay | Admitting: Internal Medicine

## 2009-08-11 ENCOUNTER — Ambulatory Visit: Payer: Self-pay | Admitting: Internal Medicine

## 2009-08-11 LAB — CONVERTED CEMR LAB: ENA SM Ab Ser-aCnc: 1 (ref <30)

## 2009-08-17 ENCOUNTER — Telehealth: Payer: Self-pay | Admitting: Internal Medicine

## 2009-10-21 ENCOUNTER — Encounter: Payer: Self-pay | Admitting: Internal Medicine

## 2009-11-09 ENCOUNTER — Telehealth: Payer: Self-pay | Admitting: Internal Medicine

## 2010-01-13 ENCOUNTER — Encounter: Admission: RE | Admit: 2010-01-13 | Discharge: 2010-01-13 | Payer: Self-pay | Admitting: Internal Medicine

## 2010-01-24 ENCOUNTER — Ambulatory Visit: Payer: Self-pay | Admitting: Internal Medicine

## 2010-02-01 ENCOUNTER — Telehealth: Payer: Self-pay | Admitting: Internal Medicine

## 2010-02-14 ENCOUNTER — Ambulatory Visit: Payer: Self-pay | Admitting: Internal Medicine

## 2010-02-14 LAB — CONVERTED CEMR LAB
ALT: 21 units/L (ref 0–35)
Albumin: 4 g/dL (ref 3.5–5.2)
Alkaline Phosphatase: 83 units/L (ref 39–117)
BUN: 17 mg/dL (ref 6–23)
Cholesterol: 226 mg/dL — ABNORMAL HIGH (ref 0–200)
GFR calc non Af Amer: 89.72 mL/min (ref >60)
Glucose, Bld: 104 mg/dL — ABNORMAL HIGH (ref 70–99)
HDL: 66.9 mg/dL (ref >39.00)
Potassium: 3.7 meq/L (ref 3.5–5.1)
Total Protein: 6.8 g/dL (ref 6.0–8.3)
VLDL: 22.8 mg/dL (ref 0.0–40.0)

## 2010-02-15 ENCOUNTER — Encounter: Payer: Self-pay | Admitting: Internal Medicine

## 2010-04-18 NOTE — Progress Notes (Signed)
  Phone Note Outgoing Call   Reason for Call: Discuss lab or test results Summary of Call: Please call patient - Hgb 11.5 - OK, A1C 6.5% - great, chemistries all normal. Letter has been done. Thanks Initial call taken by: Jacques Navy MD,  June 07, 2009 4:43 AM  Follow-up for Phone Call        lmovm Follow-up by: Ami Bullins CMA,  June 07, 2009 9:41 AM  Additional Follow-up for Phone Call Additional follow up Details #1::        informed pt and she states that she did receive letter with lab results. Pt states she still feels washed out, but states that she does not notice anymore blood in her stools. She wants to know what she can do as far as being washed out and having no energy. Additional Follow-up by: Ami Bullins CMA,  June 07, 2009 2:09 PM    Additional Follow-up for Phone Call Additional follow up Details #2::    get plenty of sleep. If she snores or has complete fatigue she may be a candidate for sleep study.  Can discuss at ov. Follow-up by: Jacques Navy MD,  June 07, 2009 5:36 PM  Additional Follow-up for Phone Call Additional follow up Details #3:: Details for Additional Follow-up Action Taken: pt states she still feels very washed out, but is getting some better she will call in about a week for update of symptoms, for next step. Additional Follow-up by: Ami Bullins CMA,  June 09, 2009 10:00 AM

## 2010-04-18 NOTE — Letter (Signed)
Summary: [facility]   Imported By: Lester [place] 06/14/2009 08:09:45  _____________________________________________________________________  External Attachment:    Type:   Image     Comment:   External Document

## 2010-04-18 NOTE — Progress Notes (Signed)
Summary: TRIAGE-coughing  Medications Added NEXIUM 40 MG CPDR (ESOMEPRAZOLE MAGNESIUM) 1 by mouth two times a day       Phone Note Call from Patient Call back at Home Phone [phone]   Caller: Patient Call For: Dr. Juanda Chance Reason for Call: Talk to Nurse Summary of Call: pt complaining of non-stop coughing from reflux and would like an rx called in to suppress her cough so she can sleep. Initial call taken by: Vallarie Mare,  March 30, 2009 3:23 PM  Follow-up for Phone Call        (Pt. had Mano/24 hour probe on 03-22-09, results were mailed to Dr.Brodie on 03-25-09.) Pt. c/o worsening cough, getting worse at nights. "It chokes me and I almost throw-up"  Pt. takes Omeprazole 20mg  three times a day, Carafate two times a day, Reglan 10mg  at bedtime, Tessalon pearls as needed, Mucinex and Chlor-Trimeton as needed.  DR.BRODIE PLEASE ADVISE  Follow-up by: Laureen Ochs LPN,  March 30, 2009 4:05 PM  Additional Follow-up for Phone Call Additional follow up Details #1::        She should be having es. manometry and 24 hr pH probe  soon. Has she been scheduled? If confirmatory, I will refer her for Nissen Fundoplication. For now, You can substitute Nexiem 40 mg  by mouth two times a day for Prelosec 20 mg by mouth three times a day ( she would need samples) Additional Follow-up by: Hart Carwin MD,  April 01, 2009 12:02 AM    Additional Follow-up for Phone Call Additional follow up Details #2::    I left the patient a message that Nexium samples will be at the front desk.  I have left her a message that we will contact her after Dr Juanda Chance reviews the [facility] and 24 hour ph probe results.  She is asked to call back if she has any furhter questions. Follow-up by: Darcey Nora RN, CGRN,  April 01, 2009 2:28 PM  New/Updated Medications: NEXIUM 40 MG CPDR (ESOMEPRAZOLE MAGNESIUM) 1 by mouth two times a day

## 2010-04-18 NOTE — Letter (Signed)
Summary: [facility]   Imported By: Lester [place] 08/18/2009 10:57:32  _____________________________________________________________________  External Attachment:    Type:   Image     Comment:   External Document

## 2010-04-18 NOTE — Progress Notes (Signed)
Summary: REFERAL - OV Thursday  Phone Note From Other Clinic   Summary of Call: Pt's insurance does not have any providers in the area that will accept her insurance. Stanton Kidney has put in request w/insurance to get coverage. This will take days to get notice. Pt is anxious and I scheduled her for end of day Thursday to discuss lab results.  Initial call taken by: Lamar Sprinkles, CMA,  Aug 08, 2009 5:12 PM

## 2010-04-18 NOTE — Progress Notes (Signed)
Summary: call  Phone Note Call from Patient Call back at Home Phone [phone]   Summary of Call: Patient left message on triage requesting a call from Ami. Initial call taken by: Lucious Groves,  June 22, 2009 2:44 PM  Follow-up for Phone Call        pt will be having surg monday April 11,2011 around 11am per pt, just an FYI Follow-up by: Ami Bullins CMA,  June 23, 2009 9:39 AM

## 2010-04-18 NOTE — Letter (Signed)
Summary: Insurance reconsider [facility]  [address] [place], [place] [address]   Phone: [phone]  Fax: [phone]        06/08/2009  [facility] [address] [place], [place]  [address]  To Whom It May Concern:  It has recently come to our attention that our patient, Ms. [name] who desperately needs a gastroentestinal surgery has been denied by [facility] to have the surgery completed by one of our trusted local physicians. Instead, our office was given a short list of in network providers to contact in order for the patient to have the procedure. Upon further review of this list, it has become evident that the list provided by [facility] is in fact substandard.   On the in network provider options given to [place], there were three phycisians listed which included Dr Robina Ade, Dr Heywood Bene and Dr Barbaraann Barthel. We have contacted Dr Sheppard Evens old office at the number provided and he is actually retired from general surgery and now only participates in wound care. We contacted Dr Colleen Can office in [place], [place] and were told that Dr Kristine Garbe does not perform Nissen Fundoplications, the surgery needed by the patient. In addition, Dr Leia Alf office is located over 130 miles away from the patient's residence here in [place], [place] and unfortunately at her age, she will take an extended amount of recovery time and will most likely need the support of family and friends for this which would be practically impossible given the distance from her home. We also contacted Dr Marlane Hatcher office and were again told that he does not perform Nissan Fundoplications.  We find it completely unacceptable that this patient be made to pay out of network costs for her surgical procedure given that all in network providers do not perform the needed surgery or are no longer in practice. We kindly ask  that you reconsider this patient's coverage and appreciate your prompt attention to this matter as the patient needs the surgery as soon as possible.  If we can be of any further assistance, please feel free to contact my office at [phone].  Sincerely,     Hedwig Morton. Juanda Chance, MD            Appended Document: Insurance reconsider Out Of Network looks great, but I would make few "adjustments".

## 2010-04-18 NOTE — Letter (Signed)
Summary: [facility]   Imported By: Sherian Rein 08/02/2009 12:18:55  _____________________________________________________________________  External Attachment:    Type:   Image     Comment:   External Document

## 2010-04-18 NOTE — Progress Notes (Signed)
Summary: samples  Medications Added NEXIUM 40 MG CPDR (ESOMEPRAZOLE MAGNESIUM) 1 by mouth two times a day       Phone Note Call from Patient Call back at Home Phone [phone]   Caller: Patient Call For: Juanda Chance Reason for Call: Talk to Nurse Summary of Call: Patient would like some Nexium samples to pick up this sfternoon after her visit with Dr Debby Bud Initial call taken by: Tawni Levy,  April 25, 2009 2:29 PM  Follow-up for Phone Call        Samples have been put at the front desk for patient pick up. Follow-up by: Hortense Ramal CMA Duncan Dull),  April 25, 2009 2:33 PM    New/Updated Medications: NEXIUM 40 MG CPDR (ESOMEPRAZOLE MAGNESIUM) 1 by mouth two times a day

## 2010-04-18 NOTE — Progress Notes (Signed)
Summary: Pt. wants to speak with Dr.Kizer Nobbe only.   Phone Note Call from Patient Call back at Home Phone [phone]   Caller: Patient Call For: Juanda Chance Reason for Call: Talk to Nurse Complaint: Urinary/GYN Problems Summary of Call: Patient states that she has an appointment tomorrow with the surgeon but would to speak directly to Dr Juanda Chance first (please) Initial call taken by: Tawni Levy,  May 17, 2009 1:34 PM  Follow-up for Phone Call        Pt. will see Dr.Gerkin tomorrow for a Nissan consult. Pt. states she has many questions to ask Dr.Deni Lefever, declines to speak to anyone but Dr.Kais Monje. Requests Dr.Edin Kon call her sometime today.   Cheral Almas HER AT [phone]  Follow-up by: Laureen Ochs LPN,  May 17, 2009 2:24 PM  Additional Follow-up for Phone Call Additional follow up Details #1::        I have spoken to the pt about needing  Nisses for intractable reflux. Additional Follow-up by: Hart Carwin MD,  May 17, 2009 2:39 PM

## 2010-04-18 NOTE — Progress Notes (Signed)
Summary: REFERRAL  Phone Note Call from Patient   Summary of Call: Pt recieved a message that the lupus antibody was negative. Does she need to continue to f/u w/Rheumatology?  Initial call taken by: Lamar Sprinkles, CMA,  August 17, 2009 3:29 PM  Follow-up for Phone Call        With negative labs it is OK to cancel consult. Please notify [facility] Follow-up by: Jacques Navy MD,  August 18, 2009 8:59 AM  Additional Follow-up for Phone Call Additional follow up Details #1::        Left vm for pt, and flag to [facility] Additional Follow-up by: Lamar Sprinkles, CMA,  August 18, 2009 9:02 AM

## 2010-04-18 NOTE — Progress Notes (Signed)
Summary: GYN & LABS  Phone Note Call from Patient Call back at Home Phone [phone]   Summary of Call: Patient is requesting a call regarding labs.  Initial call taken by: Lamar Sprinkles, CMA,  February 01, 2010 2:45 PM  Follow-up for Phone Call        1.Patient is requesting reccomendation from Dr Debby Bud for female GYN. (No problems, she has best friend who was just dx with vulvar cancer) 2. Wants full labs including lipids, A1c and "all other labs" Follow-up by: Lamar Sprinkles, CMA,  February 01, 2010 4:41 PM  Additional Follow-up for Phone Call Additional follow up Details #1::        1. refer to [facility] notified 2. Labs - A1C 250.00, lipid 272.4, metabolic 401.9, TSH 244.9, hepatic 995.20  Additional Follow-up by: Jacques Navy MD,  February 02, 2010 8:05 AM  New Problems: PHYSICAL EXAMINATION (ICD-V70.0)   Additional Follow-up for Phone Call Additional follow up Details #2::    Left vm for pt on hm # per her request. Follow-up by: Lamar Sprinkles, CMA,  February 02, 2010 10:49 AM  New Problems: PHYSICAL EXAMINATION (ICD-V70.0)

## 2010-04-18 NOTE — Letter (Signed)
[facility] [address] [place], [place]  [address] Phone: [phone]      February 15, 2010   [facility] [address] [place], [place] [address]  RE:  LAB RESULTS  Dear  [name],  The following is an interpretation of your most recent lab tests.  Please take note of any instructions provided or changes to medications that have resulted from your lab work.  ELECTROLYTES:  Good - no changes needed  KIDNEY FUNCTION TESTS:  Good - no changes needed  LIVER FUNCTION TESTS:  Good - no changes needed  LIPID PANEL:  Fair - review at your next visit Triglyceride: 114.0   Cholesterol: 226   LDL: 116   HDL: 66.90   Chol/HDL%:  3  THYROID STUDIES:  Thyroid studies normal TSH: 2.81     DIABETIC STUDIES:  Excellent - no changes needed Blood Glucose: 104   HgbA1C: 6.2     Very good control of blood sugar!! Cholesterol is still above goal of 100 or less. Medical therapy is recommended. Come in to see me so we can review the medication options.    Sincerely Yours,    Jacques Navy MD  Patient: [name] Note: All result statuses are Final unless otherwise noted.  Tests: (1) Lipid Panel (LIPID)   Cholesterol          [H]  226 mg/dL                   2-956     ATP III Classification            Desirable:  < 200 mg/dL                    Borderline High:  200 - 239 mg/dL               High:  > = 240 mg/dL   Triglycerides             114.0 mg/dL                 2.1-308.6     Normal:  <150 mg/dL     Borderline High:  578 - 199 mg/dL   HDL                       46.96 mg/dL                 >29.52   VLDL Cholesterol          22.8 mg/dL                  8.4-13.2  CHO/HDL Ratio:  CHD Risk                             3                    Men          Women     1/2 Average Risk     3.4          3.3     Average Risk          5.0          4.4     2X Average Risk          9.6          7.1  3X Average Risk          15.0          11.0                            Tests: (2) Hemoglobin A1C (A1C)   Hemoglobin A1C            6.2 %                       4.6-6.5     Glycemic Control Guidelines for People with Diabetes:     Non Diabetic:  <6%     Goal of Therapy: <7%     Additional Action Suggested:  >8%   Tests: (3) BMP (METABOL)   Sodium                    140 mEq/L                   135-145   Potassium                 3.7 mEq/L                   3.5-5.1   Chloride                  102 mEq/L                   96-112   Carbon Dioxide            30 mEq/L                    19-32   Glucose              [H]  104 mg/dL                   16-10   BUN                       17 mg/dL                    9-60   Creatinine                0.8 mg/dL                   4.5-4.0   Calcium                   9.0 mg/dL                   9.8-11.9   GFR                       89.72 mL/min                >60  Tests: (4) TSH (TSH)   FastTSH                   2.81 uIU/mL                 0.35-5.50  Tests: (5) Hepatic/Liver Function Panel (HEPATIC)   Total Bilirubin           0.4 mg/dL                   1.4-7.8   Direct Bilirubin  0.1 mg/dL                   1.6-1.0   Alkaline Phosphatase      83 U/L                      39-117   AST                       23 U/L                      0-37   ALT                       21 U/L                      0-35   Total Protein             6.8 g/dL                    9.6-0.4   Albumin                   4.0 g/dL                    5.4-0.9  Tests: (6) Cholesterol LDL - Direct (DIRLDL)  Cholesterol LDL - Direct                             132.0 mg/dL

## 2010-04-18 NOTE — Progress Notes (Signed)
     Allergies: 1)  ! Hydrochlorothiazide (Hydrochlorothiazide) 2)  ! Penicillin V Potassium (Penicillin V Potassium) 3)  ! Codeine Sulfate (Codeine Sulfate) 4)  ! Paxil Cr (Paroxetine Hcl) 5)  ! * Shellfish 6)  ! Iodine 7)  ! Prednisone 8)  ! * Ivp Dye       Vision Screening:      Vision Comments: Pt had eye exam at [facility] on 10/21/2009. No diabetic retinopathy and no diabetic macular edema. Pt's next dilitated eye exam  due in August 2012.

## 2010-04-18 NOTE — Assessment & Plan Note (Signed)
Summary: discuss labs/SD   Vital Signs:  Patient profile:   70 year old female Height:      59 inches (149.86 cm) Weight:      167 pounds (75.91 kg) O2 Sat:      97 % on Room air Temp:     98.5 degrees F (36.94 degrees C) oral Pulse rate:   80 / minute BP sitting:   122 / 74  (left arm) Cuff size:   large  Vitals Entered By: Josph Macho RMA (Aug 11, 2009 4:11 PM)  O2 Flow:  Room air CC: Discuss labs/ CF Is Patient Diabetic? Yes   Primary Care Provider:  Illene Regulus, MD  CC:  Discuss labs/ CF.  History of Present Illness: At last visit patinet had diffuse cognitive complaints that were associated with post-operative condition. Her exam and labs and scans were normal. Working diagnosis was prolonged post-anesthesia confusion. She had called back and requested screening for Lupus Erythematosis. Her ANA cme back as positive and she presents to discuss this and next steps. Of note - she feels that she is back to her cognitive baseline.  Current Medications (verified): 1)  Centrum Silver  Tabs (Multiple Vitamins-Minerals) .... Take 1 Tablet By Mouth Once A Day 2)  Cinnamon 500 Mg Caps (Cinnamon) .... 2 Capsules Once Daily 3)  Fluoxetine Hcl 20 Mg  Caps (Fluoxetine Hcl) .... Take 1 Capsule By Mouth At Bedtime 4)  Indapamide 1.25 Mg  Tabs (Indapamide) .... 2 Once Daily 5)  Klor-Con M20 20 Meq  Tbcr (Potassium Chloride Crys Cr) .... Take 1 Tablet By Mouth Once A Day 6)  Levothroid 75 Mcg Tabs (Levothyroxine Sodium) .Marland Kitchen.. 1 Once Daily 7)  Losartan Potassium 100 Mg Tabs (Losartan Potassium) .Marland Kitchen.. 1 By Mouth Once Daily 8)  Omeprazole 20 Mg Cpdr (Omeprazole) .... Take 1 Capsule By Mouth Two Times A Day. 9)  Ginkgo Biloba 400 Mg Caps (Ginkgo Biloba) .... Take 1 Capsule By Mouth Once A Day 10)  Calcium 600/vitamin D 600-200 Mg-Unit  Tabs (Calcium Carbonate-Vitamin D) .... Take 1 Tablet By Mouth Two Times A Day 11)  Nasonex 50 Mcg/act Susp (Mometasone Furoate) .... 2 Puffs Each Nostril  Two Times A Day 12)  Mucinex Dm 30-600 Mg Xr12h-Tab (Dextromethorphan-Guaifenesin) .Marland Kitchen.. 1-2 Every 12 Hours As Needed 13)  Benzonatate 100 Mg Caps (Benzonatate) .Marland Kitchen.. 1-2 Every 6 Hours As Needed 14)  Tramadol Hcl 50 Mg  Tabs (Tramadol Hcl) .Marland Kitchen.. 1-2 By Mouth Every 4 Hours If Needed 15)  Chlor-Trimeton 4 Mg Tabs (Chlorpheniramine Maleate) .Marland Kitchen.. 1 Every 6 Hours As Needed 16)  Phentermine Hcl 37.5 Mg Tabs (Phentermine Hcl) .Marland Kitchen.. 1 By Mouth Qam 17)  Reglan 10 Mg  Tabs (Metoclopramide Hcl) .... Take 1 Tablet Every Night 18)  Sucralfate 1 Gm Tabs (Sucralfate) .... Take 1 By Mouth Twice Daily. Take One Hour Before or 2 Hours After Other Meds and Foods. 19)  Nexium 40 Mg Cpdr (Esomeprazole Magnesium) .Marland Kitchen.. 1 By Mouth Two Times A Day 20)  Dilt-Cd 180 Mg Xr24h-Cap (Diltiazem Hcl Coated Beads) .Marland Kitchen.. 1 Once Daily  Allergies (verified): 1)  ! Hydrochlorothiazide (Hydrochlorothiazide) 2)  ! Penicillin V Potassium (Penicillin V Potassium) 3)  ! Codeine Sulfate (Codeine Sulfate) 4)  ! Paxil Cr (Paroxetine Hcl) 5)  ! * Shellfish 6)  ! Iodine 7)  ! Prednisone 8)  ! * Ivp Dye PMH-FH-SH reviewed-no changes except otherwise noted  Review of Systems  The patient denies anorexia, fever, weight loss, decreased hearing, syncope, headaches,  abdominal pain, incontinence, muscle weakness, suspicious skin lesions, difficulty walking, abnormal bleeding, and enlarged lymph nodes.    Physical Exam  General:  heavyset AA female in no distress Head:  normocephalic and atraumatic.   Eyes:  pupils equal, pupils round, corneas and lenses clear, and no injection.   Neck:  supple.   Lungs:  normal respiratory effort.   Heart:  normal rate and regular rhythm.   Neurologic:  alert & oriented X3, cranial nerves II-XII intact, and gait normal.   Skin:  turgor normal, color normal, and no suspicious lesions.   Psych:  Oriented X3, memory intact for recent and remote, normally interactive, and good eye contact.     Impression &  Recommendations:  Problem # 1:  OTHER ABNORMAL BLOOD CHEMISTRY (ICD-790.6) ANA was positive with a titre of 1:2560 with a homogenous appearance. Discussed that this is a broad screening test and non-specific. She has had no symptoms except for mental status changes which have now resolved. Specifically she has had no MSK complaints, no skin lesions.  Plan - confirmatory lab for Lupus           pt offered reassurance.  Orders: T- * Misc. Laboratory test [phone])  Addendum - anti-DNA double stranded antibody - negative; Anti-smith antibody - negative; Sm RNP - negative.  No lab confirmation of lupus.  (greater than 50% 30 min visit spent on education and counseling)  Left msg for patient - negative labs  Complete Medication List: 1)  Centrum Silver Tabs (Multiple vitamins-minerals) .... Take 1 tablet by mouth once a day 2)  Cinnamon 500 Mg Caps (Cinnamon) .... 2 capsules once daily 3)  Fluoxetine Hcl 20 Mg Caps (Fluoxetine hcl) .... Take 1 capsule by mouth at bedtime 4)  Indapamide 1.25 Mg Tabs (Indapamide) .... 2 once daily 5)  Klor-con M20 20 Meq Tbcr (Potassium chloride crys cr) .... Take 1 tablet by mouth once a day 6)  Levothroid 75 Mcg Tabs (Levothyroxine sodium) .Marland Kitchen.. 1 once daily 7)  Losartan Potassium 100 Mg Tabs (Losartan potassium) .Marland Kitchen.. 1 by mouth once daily 8)  Omeprazole 20 Mg Cpdr (Omeprazole) .... Take 1 capsule by mouth two times a day. 9)  Ginkgo Biloba 400 Mg Caps (Ginkgo biloba) .... Take 1 capsule by mouth once a day 10)  Calcium 600/vitamin D 600-200 Mg-unit Tabs (Calcium carbonate-vitamin d) .... Take 1 tablet by mouth two times a day 11)  Nasonex 50 Mcg/act Susp (Mometasone furoate) .... 2 puffs each nostril two times a day 12)  Mucinex Dm 30-600 Mg Xr12h-tab (Dextromethorphan-guaifenesin) .Marland Kitchen.. 1-2 every 12 hours as needed 13)  Benzonatate 100 Mg Caps (Benzonatate) .Marland Kitchen.. 1-2 every 6 hours as needed 14)  Tramadol Hcl 50 Mg Tabs (Tramadol hcl) .Marland Kitchen.. 1-2 by mouth every  4 hours if needed 15)  Chlor-trimeton 4 Mg Tabs (Chlorpheniramine maleate) .Marland Kitchen.. 1 every 6 hours as needed 16)  Phentermine Hcl 37.5 Mg Tabs (Phentermine hcl) .Marland Kitchen.. 1 by mouth qam 17)  Reglan 10 Mg Tabs (Metoclopramide hcl) .... Take 1 tablet every night 18)  Sucralfate 1 Gm Tabs (Sucralfate) .... Take 1 by mouth twice daily. take one hour before or 2 hours after other meds and foods. 19)  Nexium 40 Mg Cpdr (Esomeprazole magnesium) .Marland Kitchen.. 1 by mouth two times a day 20)  Dilt-cd 180 Mg Xr24h-cap (Diltiazem hcl coated beads) .Marland Kitchen.. 1 once daily  Other Orders: T-dsDNA Assay [phone]) T-Ribonucleic Protein AB, IgG [phone])

## 2010-04-18 NOTE — Progress Notes (Signed)
Summary: LUPUS?  Phone Note Call from Patient Call back at Home Phone [phone]   Summary of Call: Patient wants to know if she has ever been tested for lupus. If no, she would like testing this week.  Initial call taken by: Lamar Sprinkles, CMA,  Aug 01, 2009 8:52 AM  Follow-up for Phone Call        Lupus - the great masquerader. No previous study done. OK for ANA - 780.97 Follow-up by: Jacques Navy MD,  Aug 01, 2009 9:04 AM  Additional Follow-up for Phone Call Additional follow up Details #1::        Left vm w/above on hm # Additional Follow-up by: Lamar Sprinkles, CMA,  Aug 01, 2009 12:00 PM

## 2010-04-18 NOTE — Assessment & Plan Note (Signed)
Summary: FLU VAC  MEN  STC   Nurse Visit   Vital Signs:  Patient profile:   70 year old female Temp:     97.7 degrees F oral  Vitals Entered By: Lanier Prude, [facility]) (January 24, 2010 3:39 PM)  Allergies: 1)  ! Hydrochlorothiazide (Hydrochlorothiazide) 2)  ! Penicillin V Potassium (Penicillin V Potassium) 3)  ! Codeine Sulfate (Codeine Sulfate) 4)  ! Paxil Cr (Paroxetine Hcl) 5)  ! * Shellfish 6)  ! Iodine 7)  ! Prednisone 8)  ! * Ivp Dye  Orders Added: 1)  Flu Vaccine 71yrs + MEDICARE PATIENTS [Q2039] 2)  Administration Flu vaccine - MCR [G0008]    Flu Vaccine Consent Questions     Do you have a history of severe allergic reactions to this vaccine? no    Any prior history of allergic reactions to egg and/or gelatin? no    Do you have a sensitivity to the preservative Thimersol? no    Do you have a past history of Guillan-Barre Syndrome? no    Do you currently have an acute febrile illness? no    Have you ever had a severe reaction to latex? no    Vaccine information given and explained to patient? yes    Are you currently pregnant? no    Lot Number:[id number]   Exp Date:09/16/2010   Site Given  Left Deltoid IM Lanier Prude, [facility])  January 24, 2010 3:39 PM

## 2010-04-18 NOTE — Assessment & Plan Note (Signed)
Summary: weakness/ request appt 5/10/cd   Vital Signs:  Patient profile:   70 year old female Height:      59 inches Weight:      166 pounds BMI:     33.65 O2 Sat:      97 % on Room air Temp:     98.1 degrees F oral Pulse rate:   75 / minute BP sitting:   118 / 86  (left arm) Cuff size:   large  Vitals Entered By: Bill Salinas CMA (Jul 26, 2009 3:09 PM)  O2 Flow:  Room air CC: pt here with ongoing c/o weakness and fatigue/ ab   Primary Care Provider:  Illene Regulus, MD  CC:  pt here with ongoing c/o weakness and fatigue/ ab.  History of Present Illness: Patient presents 4 weeks post-op for repair of hiatal hernia. Surgery went well and she did well. She was fine at discharge. She reports that since surgery she has a feeling of generalized weakness; clouded mentation, electrical like sensation in her body and a restless that makes it uncomfortable for her to sit still. she finds that she has a had time reading - sees the words but can't put the thoughts together. She doesn't feel able to drive. Her speech is less fluent. She does not feel she can drive for lack of focus. She denies focal weakness, disorientation, loss of appeitite. She has  been feeling cold and has used extra clothes and wraps. Her affect is different.   She has seen Dr. Michaell Cowing in follow-up. She shared her symptoms with him. Dr. Michaell Cowing ordered multiple lab including blood counts, chemistries  and chest x-ray. She was told all the labs were normal.   Current Medications (verified): 1)  Centrum Silver  Tabs (Multiple Vitamins-Minerals) .... Take 1 Tablet By Mouth Once A Day 2)  Cinnamon 500 Mg Caps (Cinnamon) .... 2 Capsules Once Daily 3)  Fluoxetine Hcl 20 Mg  Caps (Fluoxetine Hcl) .... Take 1 Capsule By Mouth At Bedtime 4)  Indapamide 1.25 Mg  Tabs (Indapamide) .... 2 Once Daily 5)  Klor-Con M20 20 Meq  Tbcr (Potassium Chloride Crys Cr) .... Take 1 Tablet By Mouth Once A Day 6)  Levothroid 75 Mcg Tabs (Levothyroxine  Sodium) .Marland Kitchen.. 1 Once Daily 7)  Losartan Potassium 100 Mg Tabs (Losartan Potassium) .Marland Kitchen.. 1 By Mouth Once Daily 8)  Omeprazole 20 Mg Cpdr (Omeprazole) .... Take 1 Capsule By Mouth Two Times A Day. 9)  Ginkgo Biloba 400 Mg Caps (Ginkgo Biloba) .... Take 1 Capsule By Mouth Once A Day 10)  Calcium 600/vitamin D 600-200 Mg-Unit  Tabs (Calcium Carbonate-Vitamin D) .... Take 1 Tablet By Mouth Two Times A Day 11)  Nasonex 50 Mcg/act Susp (Mometasone Furoate) .... 2 Puffs Each Nostril Two Times A Day 12)  Mucinex Dm 30-600 Mg Xr12h-Tab (Dextromethorphan-Guaifenesin) .Marland Kitchen.. 1-2 Every 12 Hours As Needed 13)  Benzonatate 100 Mg Caps (Benzonatate) .Marland Kitchen.. 1-2 Every 6 Hours As Needed 14)  Tramadol Hcl 50 Mg  Tabs (Tramadol Hcl) .Marland Kitchen.. 1-2 By Mouth Every 4 Hours If Needed 15)  Chlor-Trimeton 4 Mg Tabs (Chlorpheniramine Maleate) .Marland Kitchen.. 1 Every 6 Hours As Needed 16)  Phentermine Hcl 37.5 Mg Tabs (Phentermine Hcl) .Marland Kitchen.. 1 By Mouth Qam 17)  Reglan 10 Mg  Tabs (Metoclopramide Hcl) .... Take 1 Tablet Every Night 18)  Sucralfate 1 Gm Tabs (Sucralfate) .... Take 1 By Mouth Twice Daily. Take One Hour Before or 2 Hours After Other Meds and Foods. 19)  Nexium 40 Mg Cpdr (Esomeprazole Magnesium) .Marland Kitchen.. 1 By Mouth Two Times A Day 20)  Dilt-Cd 180 Mg Xr24h-Cap (Diltiazem Hcl Coated Beads) .Marland Kitchen.. 1 Once Daily  Allergies (verified): 1)  ! Hydrochlorothiazide (Hydrochlorothiazide) 2)  ! Penicillin V Potassium (Penicillin V Potassium) 3)  ! Codeine Sulfate (Codeine Sulfate) 4)  ! Paxil Cr (Paroxetine Hcl) 5)  ! * Shellfish 6)  ! Iodine 7)  ! Prednisone 8)  ! * Ivp Dye  Past History:  Past Medical History: Last updated: 03/04/2009 Hypertension Hypothyroidism sleep apnea - dr Shelle Iron - CPAP 2009 Depression Diabetes mellitus, type II - diet Allergic rhinitis Anxiety Chronic cough................................................................Marland KitchenWert    - onset 11/2007    - Sinus CT September 22, 2008 > neg   -  Resolved 10/2008 on ppi  two times a day  Obesity Pneumonia Hiatal Hernia GERD  Past Surgical History: Left Breast Lumpectomy Tonsillectomy X 2 Cholecystectomy 1998 in [place] s/p rectocele repair hx of bladder suspension Ventral hernia repair Appendectomy Hysterectomy Esophageal fundoplication March '11 ( Dr. Michaell Cowing)  Family History: Reviewed history from 01/12/2009 and no changes required. biological mother with heart disease, stroke father with lung cancer/smoker No FH of Colon Cancer Patient adopted   Social History: Reviewed history from 01/12/2009 and no changes required. Pt is divorced and single now. Patient states former smoker. Quit 25 yrs ago. Smoked x 5 yrs 1 1/2ppd. 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   Review of Systems       The patient complains of weight loss.  The patient denies anorexia, fever, vision loss, decreased hearing, chest pain, syncope, dyspnea on exertion, peripheral edema, headaches, abdominal pain, hematochezia, incontinence, muscle weakness, difficulty walking, unusual weight change, enlarged lymph nodes, and angioedema.    Physical Exam  General:  WNWD AA female in no distress Head:  normocephalic and atraumatic.   Eyes:  vision grossly intact, pupils equal, pupils round, corneas and lenses clear, and no injection.   Ears:  normal hearing Neck:  supple, full ROM, and no thyromegaly.   Lungs:  normal respiratory effort and normal breath sounds.   Heart:  normal rate, regular rhythm, no murmur, no gallop, and no JVD.   Abdomen:  normal bowel sounds.   Msk:  normal ROM, no joint tenderness, no joint swelling, no joint warmth, and no joint deformities.   Pulses:  2+ radial pulse Extremities:  no peripheral edema Neurologic:  alert & oriented X3, cranial nerves II-XII intact, strength normal in all extremities, sensation intact to light touch, sensation intact to pinprick, gait  normal, DTRs symmetrical and normal, and finger-to-nose normal.  No tremor, no cogwheeling. Gave correct date, nickles in $1.25, count down is fluent, clock-face- correct and fluent Skin:  turgor normal, color normal, and no suspicious lesions.   Cervical Nodes:  no anterior cervical adenopathy and no posterior cervical adenopathy.   Psych:  memory intact for recent and remote, normally interactive, good eye contact, and moderately anxious.     Impression & Recommendations:  Problem # 1:  ALTERED MENTAL STATUS (ICD-780.97) Paitent c/o change in mental status. Her exam is non-focal and she has normal cognition although there is mild psycho-retardation in her responses. By her report Dr. Michaell Cowing had lab work done  that was normal mitigating against any metabolic derangement. Etiology may be a prolonged post-anesthesia state.   Plan - if her symptoms do not improve over the next 10-14  days will move ahead with MRI to r/o CNS event of CNS lesion.  Complete Medication List: 1)  Centrum Silver Tabs (Multiple vitamins-minerals) .... Take 1 tablet by mouth once a day 2)  Cinnamon 500 Mg Caps (Cinnamon) .... 2 capsules once daily 3)  Fluoxetine Hcl 20 Mg Caps (Fluoxetine hcl) .... Take 1 capsule by mouth at bedtime 4)  Indapamide 1.25 Mg Tabs (Indapamide) .... 2 once daily 5)  Klor-con M20 20 Meq Tbcr (Potassium chloride crys cr) .... Take 1 tablet by mouth once a day 6)  Levothroid 75 Mcg Tabs (Levothyroxine sodium) .Marland Kitchen.. 1 once daily 7)  Losartan Potassium 100 Mg Tabs (Losartan potassium) .Marland Kitchen.. 1 by mouth once daily 8)  Omeprazole 20 Mg Cpdr (Omeprazole) .... Take 1 capsule by mouth two times a day. 9)  Ginkgo Biloba 400 Mg Caps (Ginkgo biloba) .... Take 1 capsule by mouth once a day 10)  Calcium 600/vitamin D 600-200 Mg-unit Tabs (Calcium carbonate-vitamin d) .... Take 1 tablet by mouth two times a day 11)  Nasonex 50 Mcg/act Susp (Mometasone furoate) .... 2 puffs each nostril two times a day 12)   Mucinex Dm 30-600 Mg Xr12h-tab (Dextromethorphan-guaifenesin) .Marland Kitchen.. 1-2 every 12 hours as needed 13)  Benzonatate 100 Mg Caps (Benzonatate) .Marland Kitchen.. 1-2 every 6 hours as needed 14)  Tramadol Hcl 50 Mg Tabs (Tramadol hcl) .Marland Kitchen.. 1-2 by mouth every 4 hours if needed 15)  Chlor-trimeton 4 Mg Tabs (Chlorpheniramine maleate) .Marland Kitchen.. 1 every 6 hours as needed 16)  Phentermine Hcl 37.5 Mg Tabs (Phentermine hcl) .Marland Kitchen.. 1 by mouth qam 17)  Reglan 10 Mg Tabs (Metoclopramide hcl) .... Take 1 tablet every night 18)  Sucralfate 1 Gm Tabs (Sucralfate) .... Take 1 by mouth twice daily. take one hour before or 2 hours after other meds and foods. 19)  Nexium 40 Mg Cpdr (Esomeprazole magnesium) .Marland Kitchen.. 1 by mouth two times a day 20)  Dilt-cd 180 Mg Xr24h-cap (Diltiazem hcl coated beads) .Marland Kitchen.. 1 once daily

## 2010-04-18 NOTE — Miscellaneous (Signed)
Summary: Doctor, [facility] practice [facility]   Imported By: Lester Townsend 06/09/2009 09:41:12  _____________________________________________________________________  External Attachment:    Type:   Image     Comment:   External Document

## 2010-04-18 NOTE — Progress Notes (Signed)
Summary: Lab results  Phone Note Call from Patient Call back at Home Phone [phone]   Caller: Patient Summary of Call: pt called requesting results of Lab (Lupus specifically). Pt is very anxious about results as soon as possible. Pt is requesting VM be left if she is unable to answer. Initial call taken by: Margaret Pyle, CMA,  Aug 04, 2009 2:29 PM  Follow-up for Phone Call        ANA is positive with a very high titre. She will need referral to Rheumatology - Dr. Kellie Simmering first choice. If no early appointment office visit with me to start steroids.   PCC notfied -s ee orders Follow-up by: Jacques Navy MD,  Aug 05, 2009 3:43 PM  Additional Follow-up for Phone Call Additional follow up Details #1::        Pt informed  Additional Follow-up by: Lamar Sprinkles, CMA,  Aug 05, 2009 5:26 PM  New Problems: OTHER ABNORMAL BLOOD CHEMISTRY (ICD-790.6)   New Problems: OTHER ABNORMAL BLOOD CHEMISTRY (ICD-790.6)

## 2010-04-18 NOTE — Assessment & Plan Note (Signed)
Summary: prescription/#/cd   Vital Signs:  Patient profile:   70 year old female Height:      59 inches Weight:      182 pounds BMI:     36.89 O2 Sat:      96 % on Room air Temp:     97.4 degrees F oral Pulse rate:   73 / minute BP sitting:   118 / 72  (left arm) Cuff size:   large  Vitals Entered By: Bill Salinas CMA (April 25, 2009 3:32 PM)  O2 Flow:  Room air CC: pt here to discuss alternative to diltiazem, she also wants to discuss upcoming surg she is having with Dr Bertram Denver   Primary Care Provider:  Illene Regulus, MD  CC:  pt here to discuss alternative to diltiazem and she also wants to discuss upcoming surg she is having with Dr Gerrit Friends ab.  History of Present Illness: Patient presents for refill of medications. She has been told that her diltiazem hcl extended release. Called and spoke to a member representative who confirms that the drug is covered ( phone time).   Patient is scheduled for laprascopic hiatal hernia repair in March for refractory reflux.  She needs a handicap placard form completed.   Current Medications (verified): 1)  Centrum Silver  Tabs (Multiple Vitamins-Minerals) .... Take 1 Tablet By Mouth Once A Day 2)  Cinnamon 500 Mg Caps (Cinnamon) .... 2 Capsules Once Daily 3)  Fluoxetine Hcl 20 Mg  Caps (Fluoxetine Hcl) .... Take 1 Capsule By Mouth At Bedtime 4)  Indapamide 1.25 Mg  Tabs (Indapamide) .... 2 Once Daily 5)  Klor-Con M20 20 Meq  Tbcr (Potassium Chloride Crys Cr) .... Take 1 Tablet By Mouth Once A Day 6)  Levothroid 75 Mcg Tabs (Levothyroxine Sodium) .Marland Kitchen.. 1 Once Daily 7)  Diovan 320 Mg  Tabs (Valsartan) .... Take 1 Tablet By Mouth Once A Day 8)  Omeprazole 20 Mg Cpdr (Omeprazole) .... Take 1 Capsule By Mouth Two Times A Day. 9)  Ginkgo Biloba 400 Mg Caps (Ginkgo Biloba) .... Take 1 Capsule By Mouth Once A Day 10)  Calcium 600/vitamin D 600-200 Mg-Unit  Tabs (Calcium Carbonate-Vitamin D) .... Take 1 Tablet By Mouth Two Times A  Day 11)  Nasonex 50 Mcg/act Susp (Mometasone Furoate) .... 2 Puffs Each Nostril Two Times A Day 12)  Mucinex Dm 30-600 Mg Xr12h-Tab (Dextromethorphan-Guaifenesin) .Marland Kitchen.. 1-2 Every 12 Hours As Needed 13)  Benzonatate 100 Mg Caps (Benzonatate) .Marland Kitchen.. 1-2 Every 6 Hours As Needed 14)  Tramadol Hcl 50 Mg  Tabs (Tramadol Hcl) .Marland Kitchen.. 1-2 By Mouth Every 4 Hours If Needed 15)  Chlor-Trimeton 4 Mg Tabs (Chlorpheniramine Maleate) .Marland Kitchen.. 1 Every 6 Hours As Needed 16)  Phentermine Hcl 37.5 Mg Tabs (Phentermine Hcl) .Marland Kitchen.. 1 By Mouth Qam 17)  Reglan 10 Mg  Tabs (Metoclopramide Hcl) .... Take 1 Tablet Every Night 18)  Sucralfate 1 Gm Tabs (Sucralfate) .... Take 1 By Mouth Twice Daily. Take One Hour Before or 2 Hours After Other Meds and Foods. 19)  Nexium 40 Mg Cpdr (Esomeprazole Magnesium) .Marland Kitchen.. 1 By Mouth Two Times A Day  Allergies (verified): 1)  ! Hydrochlorothiazide (Hydrochlorothiazide) 2)  ! Penicillin V Potassium (Penicillin V Potassium) 3)  ! Codeine Sulfate (Codeine Sulfate) 4)  ! Paxil Cr (Paroxetine Hcl) 5)  ! * Shellfish 6)  ! Iodine 7)  ! Prednisone 8)  ! * Ivp Dye  Past History:  Past Medical History: Last updated: 03/04/2009 Hypertension  Hypothyroidism sleep apnea - dr Shelle Iron - CPAP 2009 Depression Diabetes mellitus, type II - diet Allergic rhinitis Anxiety Chronic cough................................................................Marland KitchenWert    - onset 11/2007    - Sinus CT September 22, 2008 > neg   -  Resolved 10/2008 on ppi two times a day  Obesity Pneumonia Hiatal Hernia GERD  Past Surgical History: Last updated: 01/12/2009 Left Breast Lumpectomy Tonsillectomy X 2 Cholecystectomy 1998 in [place] s/p rectocele repair hx of bladder suspension Ventral hernia repair Appendectomy Hysterectomy  Family History: Last updated: 01/12/2009 biological mother with heart disease, stroke father with lung cancer/smoker No FH of Colon Cancer Patient adopted   Social History: Last updated:  01/12/2009 Pt is divorced and single now. Patient states former smoker. Quit 25 yrs ago. Smoked x 5 yrs 1 1/2ppd. 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   Risk Factors: Alcohol Use: <1 (02/23/2009) Exercise: no (01/12/2009)  Risk Factors: Smoking Status: quit (02/23/2009) Passive Smoke Exposure: no (02/23/2009)  Review of Systems  The patient denies anorexia, fever, weight loss, weight gain, vision loss, decreased hearing, chest pain, syncope, dyspnea on exertion, prolonged cough, headaches, abdominal pain, hematochezia, incontinence, muscle weakness, difficulty walking, depression, abnormal bleeding, and angioedema.    Physical Exam  General:  overweight AA female in no distress Head:  normocephalic and atraumatic.   Eyes:  vision grossly intact, pupils equal, pupils round, corneas and lenses clear, and no injection.   Mouth:  good dentition, no gingival abnormalities, and no dental plaque.   Neck:  full ROM, no thyromegaly, and no carotid bruits.   Lungs:  normal respiratory effort, no intercostal retractions, no accessory muscle use, normal breath sounds, no fremitus, no crackles, and no wheezes.   Heart:  normal rate, regular rhythm, no murmur, no JVD, and no HJR.   Abdomen:  soft and normal bowel sounds.   Msk:  normal ROM, no joint tenderness, no joint swelling, no redness over joints, no joint deformities, and no joint instability.   Pulses:  2+ radial pulses Neurologic:  alert & oriented X3, cranial nerves II-XII intact, strength normal in all extremities, gait normal, and DTRs symmetrical and normal.   Skin:  turgor normal, color normal, no rashes, no ulcerations, and no edema.   Cervical Nodes:  no anterior cervical adenopathy and no posterior cervical adenopathy.   Psych:  Oriented X3, memory intact for recent and remote, normally interactive, good eye contact, and not anxious appearing.      Impression & Recommendations:  Problem # 1:  HYPERTENSION (ICD-401.9)  The following medications were removed from the medication list:    Diltiazem Hcl Cr 180 Mg Cp24 (Diltiazem hcl) .Marland Kitchen... 1 at bedtime Her updated medication list for this problem includes:    Indapamide 1.25 Mg Tabs (Indapamide) .Marland Kitchen... 2 once daily    Losartan Potassium 100 Mg Tabs (Losartan potassium) .Marland Kitchen... 1 by mouth once daily    Diltiazem Hcl Er Beads 180 Mg Xr24h-cap (Diltiazem hcl er beads) .Marland Kitchen... 1 by mouth once daily  BP today: 118/72 Prior BP: 124/78 (03/04/2009)  Prior 10 Yr Risk Heart Disease: 17 % (02/23/2009)  Labs Reviewed: K+: 3.6 (12/16/2008) Creat: : 0.8 (12/16/2008)   Chol: 191 (01/12/2009)   HDL: 55.10 (01/12/2009)   LDL: 116 (01/12/2009)   TG: 102.0 (01/12/2009)  Good control. Plan - continue present medication.   Problem # 2:  GERD (ICD-530.81) Stable on her present regimen. She is scheduled  for fundoplication to relieve her chronic severe symptoms. She is medically stable and cleared for general anesthesia and surgery.  Her updated medication list for this problem includes:    Omeprazole 20 Mg Cpdr (Omeprazole) .Marland Kitchen... Take 1 capsule by mouth two times a day.    Sucralfate 1 Gm Tabs (Sucralfate) .Marland Kitchen... Take 1 by mouth twice daily. take one hour before or 2 hours after other meds and foods.    Nexium 40 Mg Cpdr (Esomeprazole magnesium) .Marland Kitchen... 1 by mouth two times a day  Problem # 3:  DYSLIPIDEMIA (ICD-272.4)  Labs Reviewed: SGOT: 22 (01/12/2009)   SGPT: 20 (01/12/2009)  Lipid Goals: Chol Goal: 200 (05/26/2008)   HDL Goal: 40 (05/26/2008)   LDL Goal: 70 (05/26/2008)   TG Goal: 150 (05/26/2008)  Prior 10 Yr Risk Heart Disease: 17 % (02/23/2009)   HDL:55.10 (01/12/2009), 57.7 (05/20/2008)  LDL:116 (01/12/2009), DEL (05/20/2008)  Chol:191 (01/12/2009), 226 (05/20/2008)  Trig:102.0 (01/12/2009), 80 (05/20/2008)  stable but not quite at goal of LDL 100 or less  Problem # 4:  DIABETES  MELLITUS, CONTROLLED, WITHOUT COMPLICATIONS (ICD-250.00)  Her updated medication list for this problem includes:    Losartan Potassium 100 Mg Tabs (Losartan potassium) .Marland Kitchen... 1 by mouth once daily  Labs Reviewed: Creat: 0.8 (12/16/2008)     Last Eye Exam: normal (11/09/2008) Reviewed HgBA1c results: 6.2 (12/16/2008)  6.4 (05/20/2008)  Stable on present regimen. She will need perioperative sliding scale coverage.   Complete Medication List: 1)  Centrum Silver Tabs (Multiple vitamins-minerals) .... Take 1 tablet by mouth once a day 2)  Cinnamon 500 Mg Caps (Cinnamon) .... 2 capsules once daily 3)  Fluoxetine Hcl 20 Mg Caps (Fluoxetine hcl) .... Take 1 capsule by mouth at bedtime 4)  Indapamide 1.25 Mg Tabs (Indapamide) .... 2 once daily 5)  Klor-con M20 20 Meq Tbcr (Potassium chloride crys cr) .... Take 1 tablet by mouth once a day 6)  Levothroid 75 Mcg Tabs (Levothyroxine sodium) .Marland Kitchen.. 1 once daily 7)  Losartan Potassium 100 Mg Tabs (Losartan potassium) .Marland Kitchen.. 1 by mouth once daily 8)  Omeprazole 20 Mg Cpdr (Omeprazole) .... Take 1 capsule by mouth two times a day. 9)  Ginkgo Biloba 400 Mg Caps (Ginkgo biloba) .... Take 1 capsule by mouth once a day 10)  Calcium 600/vitamin D 600-200 Mg-unit Tabs (Calcium carbonate-vitamin d) .... Take 1 tablet by mouth two times a day 11)  Nasonex 50 Mcg/act Susp (Mometasone furoate) .... 2 puffs each nostril two times a day 12)  Mucinex Dm 30-600 Mg Xr12h-tab (Dextromethorphan-guaifenesin) .Marland Kitchen.. 1-2 every 12 hours as needed 13)  Benzonatate 100 Mg Caps (Benzonatate) .Marland Kitchen.. 1-2 every 6 hours as needed 14)  Tramadol Hcl 50 Mg Tabs (Tramadol hcl) .Marland Kitchen.. 1-2 by mouth every 4 hours if needed 15)  Chlor-trimeton 4 Mg Tabs (Chlorpheniramine maleate) .Marland Kitchen.. 1 every 6 hours as needed 16)  Phentermine Hcl 37.5 Mg Tabs (Phentermine hcl) .Marland Kitchen.. 1 by mouth qam 17)  Reglan 10 Mg Tabs (Metoclopramide hcl) .... Take 1 tablet every night 18)  Sucralfate 1 Gm Tabs (Sucralfate)  .... Take 1 by mouth twice daily. take one hour before or 2 hours after other meds and foods. 19)  Nexium 40 Mg Cpdr (Esomeprazole magnesium) .Marland Kitchen.. 1 by mouth two times a day 20)  Diltiazem Hcl Er Beads 180 Mg Xr24h-cap (Diltiazem hcl er beads) .Marland Kitchen.. 1 by mouth once daily  Other Orders: Tdap => 45yrs IM (70350) Admin 1st Vaccine (09381) Prescriptions: LOSARTAN POTASSIUM 100 MG TABS (LOSARTAN  POTASSIUM) 1 by mouth once daily  #90 x 3   Entered and Authorized by:   Jacques Navy MD   Signed by:   Jacques Navy MD on 04/25/2009   Method used:   Print then Give to Patient   RxID:   [id number] DILTIAZEM HCL ER BEADS 180 MG XR24H-CAP (DILTIAZEM HCL ER BEADS) 1 by mouth once daily  #90 x 3   Entered and Authorized by:   Jacques Navy MD   Signed by:   Jacques Navy MD on 04/25/2009   Method used:   Print then Give to Patient   RxID:   [id number] LEVOTHROID 75 MCG TABS (LEVOTHYROXINE SODIUM) 1 once daily  #90 x 3   Entered and Authorized by:   Jacques Navy MD   Signed by:   Jacques Navy MD on 04/25/2009   Method used:   Print then Give to Patient   RxID:   [id number] KLOR-CON M20 20 MEQ  TBCR (POTASSIUM CHLORIDE CRYS CR) Take 1 tablet by mouth once a day  #90 x 3   Entered and Authorized by:   Jacques Navy MD   Signed by:   Jacques Navy MD on 04/25/2009   Method used:   Print then Give to Patient   RxID:   [id number] INDAPAMIDE 1.25 MG  TABS (INDAPAMIDE) 2 once daily  #180 x 3   Entered and Authorized by:   Jacques Navy MD   Signed by:   Jacques Navy MD on 04/25/2009   Method used:   Print then Give to Patient   RxID:   [id number] FLUOXETINE HCL 20 MG  CAPS (FLUOXETINE HCL) Take 1 capsule by mouth at bedtime  #90 x 3   Entered and Authorized by:   Jacques Navy MD   Signed by:   Jacques Navy MD on 04/25/2009   Method used:   Print then Give to Patient   RxID:   [id number]    Immunizations  Administered:  Tetanus Vaccine:    Vaccine Type: Tdap    Site: left deltoid    Mfr: Sanofi Pasteur    Dose: 0.5 ml    Route: IM    Given by: Ami Bullins CMA    Exp. Date: 02/01/2011    Lot #: [id number]    VIS given: 02/04/07 version given April 25, 2009.

## 2010-04-18 NOTE — Progress Notes (Signed)
Summary: PHARM ?  Phone Note From Pharmacy   Summary of Call: Pharm called. Pt has been taking Diltizem CD 180mg  and pt has rx on file that they have been filling and will fill now. Ok to update EMR with same?  Initial call taken by: Lamar Sprinkles, CMA,  July 01, 2009 3:15 PM  Follow-up for Phone Call        diltiazem beds 180mg  24h already on med list. thanks Follow-up by: Jacques Navy MD,  July 02, 2009 5:00 PM  Additional Follow-up for Phone Call Additional follow up Details #1::        Pharm says there is a difference between what we have (ER) and the CD form. Was going to change it in EMR so that next refill would reflect CD and pharm would fill w/no questions.  Additional Follow-up by: Lamar Sprinkles, CMA,  July 04, 2009 10:31 AM    Additional Follow-up for Phone Call Additional follow up Details #2::    k Follow-up by: Jacques Navy MD,  July 04, 2009 3:17 PM  New/Updated Medications: DILT-CD 180 MG XR24H-CAP (DILTIAZEM HCL COATED BEADS) 1 once daily

## 2010-04-18 NOTE — Progress Notes (Signed)
Summary: Wanted to let Dr Juanda Chance know name of surgeon   Phone Note Call from Patient Call back at Home Phone [phone]   Call For: DR BRODIE Reason for Call: Talk to Nurse Summary of Call: Her surgery is 06-27-09 her surgeon is not going to be Dr Gerrit Friends it will be Dr Karie Soda. Dr Gerrit Friends is no longer doing that surgery. Just wanted Dr to know this. Initial call taken by: Leanor Kail [facility],  May 30, 2009 2:50 PM  Follow-up for Phone Call        please leave a message for the pt that Dr Michaell Cowing is an excellent choice. Follow-up by: Hart Carwin MD,  May 30, 2009 10:54 PM     Appended Document: Wanted to let Dr Juanda Chance know name of surgeon Message left for pt. with above MD instructions. Pt. instructed to call back as needed.

## 2010-04-18 NOTE — Progress Notes (Signed)
Summary: Mano/24 Hour PH Study Results//Surgical Appt. Scheduled   Phone Note Call from Patient   Caller: Patient [phone] Call For: DR.Rebeckah Masih Summary of Call: Pt. is calling for Manometry/24 hour PH study results done on 03-22-09. Results are on Dr.Benedict Kue's desk, I will call pt. after Dr.Dulcie Gammon advises. Pt. also wants to know if she needs an appt. w/Dr.Margarita Bobrowski or a Careers adviser.  DR.Kamarie Veno PLEASE ADVISE  Initial call taken by: Laureen Ochs LPN,  April 11, 2009 2:49 PM  Follow-up for Phone Call        I have discussed results with the pt. No contraindications to Nissen Fundoplicatioin. She is opting for surgery, please arrange appointm with Dr Nat Christen( her surgeon). She wants to have her surgery before her 13 birthday. DB Follow-up by: Hart Carwin MD,  April 13, 2009 1:48 PM  Additional Follow-up for Phone Call Additional follow up Details #1::        1st available appt. with Dr.Gerkin is 05-18-09 at 9:45am. Pt. is aware and knows she can call [facility] daily to check on cancellations. Pt. instructed to call back as needed.  Additional Follow-up by: Laureen Ochs LPN,  April 13, 2009 2:23 PM

## 2010-04-18 NOTE — Letter (Signed)
[facility] [address] [place], [place]  [address] Phone: [phone]      June 03, 2009   [facility] [address] [place], [place] [address]  RE:  LAB RESULTS  Dear  Ms. Nuzum,  The following is an interpretation of your most recent lab tests.  Please take note of any instructions provided or changes to medications that have resulted from your lab work.  ELECTROLYTES:  Good - no changes needed  KIDNEY FUNCTION TESTS:  Good - no changes needed   THYROID STUDIES:  Thyroid studies normal TSH: 1.20     DIABETIC STUDIES:  Good - no changes needed Blood Glucose: 86   HgbA1C: 6.5     CBC:  Good - no changes needed  B12 level is normal.   The good news is all labs are normal and there is no evidence of underlying metabolic or medical problem causing your fatigue.   Sincerely Yours,    Jacques Navy MD  Patient: [name] Note: All result statuses are Final unless otherwise noted.  Tests: (1) B12 + Folate Panel (B12/FOL)   Vitamin B12               890 pg/mL                   211-911   Folate                    >20.0 ng/mL     Deficient  0.4 - 3.4 ng/mL     Indeterminate  3.4 - 5.4 ng/mL     Normal  >5.4 ng/mL  Tests: (2) TSH (TSH)   FastTSH                   1.20 uIU/mL                 0.35-5.50  Tests: (3) BMP (METABOL)   Sodium                    138 mEq/L                   135-145   Potassium                 3.6 mEq/L                   3.5-5.1   Chloride                  97 mEq/L                    96-112   Carbon Dioxide            32 mEq/L                    19-32   Glucose                   86 mg/dL                    73-22   BUN                       15 mg/dL                    0-25   Creatinine  0.8 mg/dL                   5.7-8.4   Calcium                   9.5 mg/dL                   6.9-62.9   GFR                       91.20 mL/min                >60  Tests: (4) CBC Platelet w/Diff (CBCD)   White Cell Count          7.5 K/uL                    4.5-10.5   Red Cell Count       [L]  3.83 Mil/uL                 3.87-5.11   Hemoglobin           [L]  11.5 g/dL                   52.8-41.3   Hematocrit           [L]  34.8 %                      36.0-46.0   MCV                       90.9 fl                     78.0-100.0   MCHC                      33.0 g/dL                   24.4-01.0   RDW                       14.2 %                      11.5-14.6   Platelet Count            333.0 K/uL                  150.0-400.0   Neutrophil %              56.5 %                      43.0-77.0   Lymphocyte %              33.4 %                      12.0-46.0   Monocyte %                7.9 %                       3.0-12.0   Eosinophils%              1.7 %                       0.0-5.0  Basophils %               0.5 %                       0.0-3.0   Neutrophill Absolute      4.3 K/uL                    1.4-7.7   Lymphocyte Absolute       2.5 K/uL                    0.7-4.0   Monocyte Absolute         0.6 K/uL                    0.1-1.0  Eosinophils, Absolute                             0.1 K/uL                    0.0-0.7   Basophils Absolute        0.0 K/uL                    0.0-0.1  Tests: (5) Hemoglobin A1C (A1C)   Hemoglobin A1C            6.5 %                       4.6-6.5     Glycemic Control Guidelines for People with Diabetes:     Non Diabetic:  <6%     Goal of Therapy: <7%     Additional Action Suggested:  >8%

## 2010-04-18 NOTE — Letter (Signed)
Summary: [facility]   Imported By: Lennie Odor 11/11/2009 11:15:15  _____________________________________________________________________  External Attachment:    Type:   Image     Comment:   External Document

## 2010-04-18 NOTE — Letter (Signed)
Summary: [facility]   Imported By: Sherian Rein 08/03/2009 93:81:82  _____________________________________________________________________  External Attachment:    Type:   Image     Comment:   External Document

## 2010-04-18 NOTE — Letter (Signed)
Summary: [facility]   Imported By: Sherian Rein 06/06/2009 10:30:20  _____________________________________________________________________  External Attachment:    Type:   Image     Comment:   External Document

## 2010-04-18 NOTE — Assessment & Plan Note (Signed)
Summary: WEAK,PT THINKS SHE MAYBE ANEMIC/#/CD   Vital Signs:  Patient profile:   70 year old female Height:      59 inches Weight:      177 pounds BMI:     35.88 O2 Sat:      97 % on Room air Temp:     97.0 degrees F oral Pulse rate:   72 / minute BP sitting:   100 / 62  (left arm) Cuff size:   large  Vitals Entered By: Bill Salinas CMA (June 02, 2009 2:22 PM)  O2 Flow:  Room air CC: pt here with complaint of weakness, she also noticed bright red blood in her stool since yesterday/ ab   Primary Care Provider:  Illene Regulus, MD  CC:  pt here with complaint of weakness and she also noticed bright red blood in her stool since yesterday/ ab.  History of Present Illness: Patient presents due to increasing weakness and fatigue. she is concerned that she is anemic.She does report bright red blood per rectum with bowel movements. She does report nocturia which interferes with sleep. she denies any symptomic symptoms otherwise. No SOB, no c/p.   She is going to have fundoplication with Dr. Michaell Cowing in the near future for her refractory GERD. She has no contraindications for surgery or anesthesia.  Current Medications (verified): 1)  Centrum Silver  Tabs (Multiple Vitamins-Minerals) .... Take 1 Tablet By Mouth Once A Day 2)  Cinnamon 500 Mg Caps (Cinnamon) .... 2 Capsules Once Daily 3)  Fluoxetine Hcl 20 Mg  Caps (Fluoxetine Hcl) .... Take 1 Capsule By Mouth At Bedtime 4)  Indapamide 1.25 Mg  Tabs (Indapamide) .... 2 Once Daily 5)  Klor-Con M20 20 Meq  Tbcr (Potassium Chloride Crys Cr) .... Take 1 Tablet By Mouth Once A Day 6)  Levothroid 75 Mcg Tabs (Levothyroxine Sodium) .Marland Kitchen.. 1 Once Daily 7)  Losartan Potassium 100 Mg Tabs (Losartan Potassium) .Marland Kitchen.. 1 By Mouth Once Daily 8)  Omeprazole 20 Mg Cpdr (Omeprazole) .... Take 1 Capsule By Mouth Two Times A Day. 9)  Ginkgo Biloba 400 Mg Caps (Ginkgo Biloba) .... Take 1 Capsule By Mouth Once A Day 10)  Calcium 600/vitamin D 600-200 Mg-Unit  Tabs  (Calcium Carbonate-Vitamin D) .... Take 1 Tablet By Mouth Two Times A Day 11)  Nasonex 50 Mcg/act Susp (Mometasone Furoate) .... 2 Puffs Each Nostril Two Times A Day 12)  Mucinex Dm 30-600 Mg Xr12h-Tab (Dextromethorphan-Guaifenesin) .Marland Kitchen.. 1-2 Every 12 Hours As Needed 13)  Benzonatate 100 Mg Caps (Benzonatate) .Marland Kitchen.. 1-2 Every 6 Hours As Needed 14)  Tramadol Hcl 50 Mg  Tabs (Tramadol Hcl) .Marland Kitchen.. 1-2 By Mouth Every 4 Hours If Needed 15)  Chlor-Trimeton 4 Mg Tabs (Chlorpheniramine Maleate) .Marland Kitchen.. 1 Every 6 Hours As Needed 16)  Phentermine Hcl 37.5 Mg Tabs (Phentermine Hcl) .Marland Kitchen.. 1 By Mouth Qam 17)  Reglan 10 Mg  Tabs (Metoclopramide Hcl) .... Take 1 Tablet Every Night 18)  Sucralfate 1 Gm Tabs (Sucralfate) .... Take 1 By Mouth Twice Daily. Take One Hour Before or 2 Hours After Other Meds and Foods. 19)  Nexium 40 Mg Cpdr (Esomeprazole Magnesium) .Marland Kitchen.. 1 By Mouth Two Times A Day 20)  Diltiazem Hcl Er Beads 180 Mg Xr24h-Cap (Diltiazem Hcl Er Beads) .Marland Kitchen.. 1 By Mouth Once Daily  Allergies (verified): 1)  ! Hydrochlorothiazide (Hydrochlorothiazide) 2)  ! Penicillin V Potassium (Penicillin V Potassium) 3)  ! Codeine Sulfate (Codeine Sulfate) 4)  ! Paxil Cr (Paroxetine Hcl) 5)  ! *  Shellfish 6)  ! Iodine 7)  ! Prednisone 8)  ! * Ivp Dye  Past History:  Past Medical History: Last updated: 03/04/2009 Hypertension Hypothyroidism sleep apnea - dr Shelle Iron - CPAP 2009 Depression Diabetes mellitus, type II - diet Allergic rhinitis Anxiety Chronic cough................................................................Marland KitchenWert    - onset 11/2007    - Sinus CT September 22, 2008 > neg   -  Resolved 10/2008 on ppi two times a day  Obesity Pneumonia Hiatal Hernia GERD  Past Surgical History: Last updated: 01/12/2009 Left Breast Lumpectomy Tonsillectomy X 2 Cholecystectomy 1998 in [place] s/p rectocele repair hx of bladder suspension Ventral hernia repair Appendectomy Hysterectomy  Family History: Last  updated: 01/12/2009 biological mother with heart disease, stroke father with lung cancer/smoker No FH of Colon Cancer Patient adopted   Social History: Last updated: 01/12/2009 Pt is divorced and single now. Patient states former smoker. Quit 25 yrs ago. Smoked x 5 yrs 1 1/2ppd. 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   Risk Factors: Alcohol Use: <1 (02/23/2009) Exercise: no (01/12/2009)  Risk Factors: Smoking Status: quit (02/23/2009) Passive Smoke Exposure: no (02/23/2009)  Review of Systems  The patient denies anorexia, fever, weight loss, weight gain, vision loss, decreased hearing, chest pain, syncope, dyspnea on exertion, peripheral edema, headaches, abdominal pain, severe indigestion/heartburn, incontinence, genital sores, transient blindness, difficulty walking, unusual weight change, enlarged lymph nodes, and angioedema.    Physical Exam  General:  alert, well-developed, well-nourished, and well-hydrated AA female.   Head:  Normocephalic and atraumatic without obvious abnormalities. No apparent alopecia or balding. Eyes:  no jaundice, corneas normal Neck:  supple and full ROM.   Chest Wall:  no deformities and no tenderness.   Lungs:  normal respiratory effort, normal breath sounds, and no wheezes.   Heart:  normal rate and regular rhythm.   Abdomen:  soft, non-tender, normal bowel sounds, and no guarding.   Msk:  normal ROM, no joint tenderness, no joint swelling, no joint deformities, and no joint instability.   Pulses:  2+ radial pulse Extremities:  No clubbing, cyanosis, edema, or deformity noted with normal full range of motion of all joints.   Neurologic:  No cranial nerve deficits noted. Station and gait are normal. Plantar reflexes are down-going bilaterally. DTRs are symmetrical throughout. Sensory, motor and coordinative functions appear intact. Skin:  Intact without  suspicious lesions or rashes Cervical Nodes:  no anterior cervical adenopathy and no posterior cervical adenopathy.   Psych:  Oriented X3, memory intact for recent and remote, and normally interactive.     Impression & Recommendations:  Problem # 1:  DIABETES MELLITUS, CONTROLLED, WITHOUT COMPLICATIONS (ICD-250.00)  Due for A1C  Her updated medication list for this problem includes:    Losartan Potassium 100 Mg Tabs (Losartan potassium) .Marland Kitchen... 1 by mouth once daily  Orders: TLB-A1C / Hgb A1C (Glycohemoglobin) (83036-A1C) Addendum - A1C reveals good control at 6.5%  Problem # 2:  HYPOTHYROIDISM (ICD-244.9)  Patient with increased fatigue  Plan - check TSH Her updated medication list for this problem includes:    Levothroid 75 Mcg Tabs (Levothyroxine sodium) .Marland Kitchen... 1 once daily  Orders: TLB-TSH (Thyroid Stimulating Hormone) (84443-TSH)  Addendum - normal lab  Problem # 3:  OTHER MALAISE AND FATIGUE (ICD-780.79)  generalized fatigue without focal findings or symptoms  Plan - check thyroid, CBC, B12, ESR  Orders: TLB-B12 + Folate Pnl (16109_60454-U98/JXB) TLB-CBC Platelet - w/Differential (85025-CBCD)  Addendum - normal lab with slight anemia, but not sufficient to explain malaise and fatigue.   Complete Medication List: 1)  Centrum Silver Tabs (Multiple vitamins-minerals) .... Take 1 tablet by mouth once a day 2)  Cinnamon 500 Mg Caps (Cinnamon) .... 2 capsules once daily 3)  Fluoxetine Hcl 20 Mg Caps (Fluoxetine hcl) .... Take 1 capsule by mouth at bedtime 4)  Indapamide 1.25 Mg Tabs (Indapamide) .... 2 once daily 5)  Klor-con M20 20 Meq Tbcr (Potassium chloride crys cr) .... Take 1 tablet by mouth once a day 6)  Levothroid 75 Mcg Tabs (Levothyroxine sodium) .Marland Kitchen.. 1 once daily 7)  Losartan Potassium 100 Mg Tabs (Losartan potassium) .Marland Kitchen.. 1 by mouth once daily 8)  Omeprazole 20 Mg Cpdr (Omeprazole) .... Take 1 capsule by mouth two times a day. 9)  Ginkgo Biloba 400 Mg  Caps (Ginkgo biloba) .... Take 1 capsule by mouth once a day 10)  Calcium 600/vitamin D 600-200 Mg-unit Tabs (Calcium carbonate-vitamin d) .... Take 1 tablet by mouth two times a day 11)  Nasonex 50 Mcg/act Susp (Mometasone furoate) .... 2 puffs each nostril two times a day 12)  Mucinex Dm 30-600 Mg Xr12h-tab (Dextromethorphan-guaifenesin) .Marland Kitchen.. 1-2 every 12 hours as needed 13)  Benzonatate 100 Mg Caps (Benzonatate) .Marland Kitchen.. 1-2 every 6 hours as needed 14)  Tramadol Hcl 50 Mg Tabs (Tramadol hcl) .Marland Kitchen.. 1-2 by mouth every 4 hours if needed 15)  Chlor-trimeton 4 Mg Tabs (Chlorpheniramine maleate) .Marland Kitchen.. 1 every 6 hours as needed 16)  Phentermine Hcl 37.5 Mg Tabs (Phentermine hcl) .Marland Kitchen.. 1 by mouth qam 17)  Reglan 10 Mg Tabs (Metoclopramide hcl) .... Take 1 tablet every night 18)  Sucralfate 1 Gm Tabs (Sucralfate) .... Take 1 by mouth twice daily. take one hour before or 2 hours after other meds and foods. 19)  Nexium 40 Mg Cpdr (Esomeprazole magnesium) .Marland Kitchen.. 1 by mouth two times a day 20)  Diltiazem Hcl Er Beads 180 Mg Xr24h-cap (Diltiazem hcl er beads) .Marland Kitchen.. 1 by mouth once daily  Other Orders: TLB-BMP (Basic Metabolic Panel-BMET) (80048-METABOL)   Patient: [name] Note: All result statuses are Final unless otherwise noted.  Tests: (1) B12 + Folate Panel (B12/FOL)   Vitamin B12               890 pg/mL                   211-911   Folate                    >20.0 ng/mL     Deficient  0.4 - 3.4 ng/mL     Indeterminate  3.4 - 5.4 ng/mL     Normal  >5.4 ng/mL  Tests: (2) TSH (TSH)   FastTSH                   1.20 uIU/mL                 0.35-5.50  Tests: (3) BMP (METABOL)   Sodium                    138 mEq/L                   135-145   Potassium                 3.6 mEq/L  3.5-5.1   Chloride                  97 mEq/L                    96-112   Carbon Dioxide            32 mEq/L                    19-32   Glucose                   86 mg/dL                    16-10   BUN                        15 mg/dL                    9-60   Creatinine                0.8 mg/dL                   4.5-4.0   Calcium                   9.5 mg/dL                   9.8-11.9   GFR                       91.20 mL/min                >60  Tests: (4) CBC Platelet w/Diff (CBCD)   White Cell Count          7.5 K/uL                    4.5-10.5   Red Cell Count       [L]  3.83 Mil/uL                 3.87-5.11   Hemoglobin           [L]  11.5 g/dL                   14.7-82.9   Hematocrit           [L]  34.8 %                      36.0-46.0   MCV                       90.9 fl                     78.0-100.0   MCHC                      33.0 g/dL                   56.2-13.0   RDW                       14.2 %                      11.5-14.6   Platelet Count  333.0 K/uL                  150.0-400.0   Neutrophil %              56.5 %                      43.0-77.0   Lymphocyte %              33.4 %                      12.0-46.0   Monocyte %                7.9 %                       3.0-12.0   Eosinophils%              1.7 %                       0.0-5.0   Basophils %               0.5 %                       0.0-3.0   Neutrophill Absolute      4.3 K/uL                    1.4-7.7   Lymphocyte Absolute       2.5 K/uL                    0.7-4.0   Monocyte Absolute         0.6 K/uL                    0.1-1.0  Eosinophils, Absolute                             0.1 K/uL                    0.0-0.7   Basophils Absolute        0.0 K/uL                    0.0-0.1  Tests: (5) Hemoglobin A1C (A1C)   Hemoglobin A1C            6.5 %                       4.6-6.5     Glycemic Control Guidelines for People with Diabetes:     Non Diabetic:  <6%     Goal of Therapy: <7%     Additional Action Suggested:  >8%

## 2010-04-18 NOTE — Progress Notes (Signed)
  Phone Note Refill Request Message from:  Fax from Pharmacy on June 23, 2009 9:37 AM  Refills Requested: Medication #1:  INDAPAMIDE 1.25 MG  TABS 2 once daily pt has not received her shipment from mail order, I will send supply to local drug store to hold her over until she gets her mail order  Initial call taken by: Ami Bullins CMA,  June 23, 2009 9:38 AM    Prescriptions: INDAPAMIDE 1.25 MG  TABS (INDAPAMIDE) 2 once daily  #180 x 0   Entered by:   Ami Bullins CMA   Authorized by:   Jacques Navy MD   Signed by:   Bill Salinas CMA on 06/23/2009   Method used:   Electronically to        [facility] [address] [id number]       [address]       [place], [place]  [address]       Ph: [phone]       Fax: [phone]   RxID:   [id number]

## 2010-05-05 ENCOUNTER — Encounter: Payer: Self-pay | Admitting: Internal Medicine

## 2010-05-05 ENCOUNTER — Ambulatory Visit (INDEPENDENT_AMBULATORY_CARE_PROVIDER_SITE_OTHER): Payer: Medicare PPO | Admitting: Internal Medicine

## 2010-05-05 DIAGNOSIS — R1032 Left lower quadrant pain: Secondary | ICD-10-CM

## 2010-05-08 ENCOUNTER — Telehealth: Payer: Self-pay | Admitting: Internal Medicine

## 2010-05-16 NOTE — Progress Notes (Signed)
Summary: Call Report  Phone Note Other Incoming   Caller: [facility] Summary of Call[facility] Triage Record Num: [id number] Operator: Elita Boone Patient Name: [name] Call Date & Time: 05/06/2010 10:49:51AM Patient Phone: [phone] PCP: Illene Regulus Patient Gender: Female PCP Fax : [phone] Patient DOB: 1941-02-18 Practice Name: [facility] Reason for Call: Pt calling to ask about prescription of Augmentin that was to called in yesterday. [facility]. Answering service called and message left for return call. Wrong provider was paged thru answering service Office back line called and [name] reports that nurse in office woould call in prescription and notify pt. Protocol(s) Used: Medication Question Calls, No Triage (Adults) Recommended Outcome per Protocol: Call Provider Immediately Reason for Outcome: [1] Urgent new prescription or refill (likelihood of harm to patient if not taken) AND [2] triager unable to fill per unit policy Caller requesting a non urgent new prescription or refill and triager unable to refill per unit policy Care Advice:  ~ 05/06/2010 11:32:38AM Page 1 of 1 [email or web address] Initial call taken by: Margaret Pyle, CMA,  May 08, 2010 8:07 AM

## 2010-05-16 NOTE — Assessment & Plan Note (Signed)
Summary: ABD PAIN /NWS   Vital Signs:  Patient profile:   70 year old female Height:      57.25 inches (145.41 cm) Weight:      172 pounds (78.18 kg) BMI:     37.03 O2 Sat:      98 % on Room air Temp:     98.5 degrees F (36.94 degrees C) oral Pulse rate:   83 / minute Resp:     16 per minute BP sitting:   126 / 82  (left arm) Cuff size:   regular  Vitals Entered By: Burnard Leigh CMA(AAMA) (May 05, 2010 3:33 PM)  O2 Flow:  Room air CC: Pt c/o abdominal pain on Left side that radiates up under breast and is having constipation x3 days.Pt states feels like gas pocket/sls,cma Is Patient Diabetic? Yes Comments Pt states that Miralax is not working.   Primary Care Provider:  Illene Regulus, MD  CC:  Pt c/o abdominal pain on Left side that radiates up under breast and is having constipation x3 days.Pt states feels like gas pocket/sls and cma.  History of Present Illness: [name] reports that she has  been having abdominal pain LLQ up to rib cage;  the pain is constant with no exacerbating factors. The pain will wake her up at night. She has had a change in bowel habit with small hard stools. No blood or mucus in the stool. Her appetite is off.  She feels bad.  As a side bar she is interested becoming a vegan. Referred to [employer] go[employer] to research the vegan diet.   Current Medications (verified): 1)  Centrum Silver  Tabs (Multiple Vitamins-Minerals) .... Take 1 Tablet By Mouth Once A Day 2)  Cinnamon 500 Mg Caps (Cinnamon) .... 2 Capsules Once Daily 3)  Fluoxetine Hcl 20 Mg  Caps (Fluoxetine Hcl) .... Take 1 Capsule By Mouth At Bedtime 4)  Indapamide 1.25 Mg  Tabs (Indapamide) .... 2 Once Daily 5)  Klor-Con M20 20 Meq  Tbcr (Potassium Chloride Crys Cr) .... Take 1 Tablet By Mouth Once A Day 6)  Levothroid 75 Mcg Tabs (Levothyroxine Sodium) .Marland Kitchen.. 1 Once Daily 7)  Losartan Potassium 100 Mg Tabs (Losartan Potassium) .Marland Kitchen.. 1 By Mouth Once Daily 8)  Omeprazole 20 Mg Cpdr  (Omeprazole) .... Take 1 Capsule By Mouth Two Times A Day. 9)  Ginkgo Biloba 400 Mg Caps (Ginkgo Biloba) .... Take 1 Capsule By Mouth Once A Day 10)  Calcium 600/vitamin D 600-200 Mg-Unit  Tabs (Calcium Carbonate-Vitamin D) .... Take 1 Tablet By Mouth Two Times A Day 11)  Nasonex 50 Mcg/act Susp (Mometasone Furoate) .... 2 Puffs Each Nostril Two Times A Day 12)  Mucinex Dm 30-600 Mg Xr12h-Tab (Dextromethorphan-Guaifenesin) .Marland Kitchen.. 1-2 Every 12 Hours As Needed 13)  Benzonatate 100 Mg Caps (Benzonatate) .Marland Kitchen.. 1-2 Every 6 Hours As Needed 14)  Tramadol Hcl 50 Mg  Tabs (Tramadol Hcl) .Marland Kitchen.. 1-2 By Mouth Every 4 Hours If Needed 15)  Chlor-Trimeton 4 Mg Tabs (Chlorpheniramine Maleate) .Marland Kitchen.. 1 Every 6 Hours As Needed 16)  Phentermine Hcl 37.5 Mg Tabs (Phentermine Hcl) .Marland Kitchen.. 1 By Mouth Qam 17)  Reglan 10 Mg  Tabs (Metoclopramide Hcl) .... Take 1 Tablet Every Night 18)  Sucralfate 1 Gm Tabs (Sucralfate) .... Take 1 By Mouth Twice Daily. Take One Hour Before or 2 Hours After Other Meds and Foods. 19)  Nexium 40 Mg Cpdr (Esomeprazole Magnesium) .Marland Kitchen.. 1 By Mouth Two Times A Day 20)  Dilt-Cd 180 Mg Xr24h-Cap (  Diltiazem Hcl Coated Beads) .Marland Kitchen.. 1 Once Daily  Allergies (verified): 1)  ! Hydrochlorothiazide (Hydrochlorothiazide) 2)  ! Penicillin V Potassium (Penicillin V Potassium) 3)  ! Codeine Sulfate (Codeine Sulfate) 4)  ! Paxil Cr (Paroxetine Hcl) 5)  ! * Shellfish 6)  ! Iodine 7)  ! Prednisone 8)  ! * Ivp Dye  Past History:  Past Medical History: Last updated: 03/04/2009 Hypertension Hypothyroidism sleep apnea - dr Shelle Iron - CPAP 2009 Depression Diabetes mellitus, type II - diet Allergic rhinitis Anxiety Chronic cough................................................................Marland KitchenWert    - onset 11/2007    - Sinus CT September 22, 2008 > neg   -  Resolved 10/2008 on ppi two times a day  Obesity Pneumonia Hiatal Hernia GERD  Past Surgical History: Last updated: 07/26/2009 Left Breast  Lumpectomy Tonsillectomy X 2 Cholecystectomy 1998 in [place] s/p rectocele repair hx of bladder suspension Ventral hernia repair Appendectomy Hysterectomy Esophageal fundoplication March '11 ( Dr. Michaell Cowing)  Family History: Last updated: 01/12/2009 biological mother with heart disease, stroke father with lung cancer/smoker No FH of Colon Cancer Patient adopted   Social History: Last updated: 01/12/2009 Pt is divorced and single now. Patient states former smoker. Quit 25 yrs ago. Smoked x 5 yrs 1 1/2ppd. 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   Review of Systems       The patient complains of anorexia and abdominal pain.  The patient denies fever, weight loss, weight gain, decreased hearing, hoarseness, dyspnea on exertion, peripheral edema, prolonged cough, genital sores, suspicious skin lesions, difficulty walking, depression, and abnormal bleeding.    Physical Exam  General:  overweight AA woman who is uncomfortable with movement. She is not in acute distress Head:  normocephalic and atraumatic.   Eyes:  No scleral icterus Neck:  supple.   Lungs:  normal respiratory effort and normal breath sounds.   Heart:  normal rate and regular rhythm.   Abdomen:  obese, BS + x 4,  tender at the LLQ to light percussion, very tender to deep palpation. No guarding or rebound.  Msk:  normal ROM, no joint tenderness, and no joint swelling.   Pulses:  2+ radial  Neurologic:  alert & oriented X3, cranial nerves II-XII intact, strength normal in all extremities, and sensation intact to light touch.   Skin:  turgor normal, color normal, no rashes, no suspicious lesions, no purpura, and no ulcerations.   Cervical Nodes:  no anterior cervical adenopathy and no posterior cervical adenopathy.   Psych:  Oriented X3, memory intact for recent and remote, normally interactive, and good eye contact.     Impression &  Recommendations:  Problem # 1:  ABDOMINAL PAIN, LEFT LOWER QUADRANT (ICD-789.04) Presentation is very suggestive of divderticulitis. Last colonoscopy '08 - diveritculosis and normal mucosal biopsied. patient reports a remote reaction to pen VK but she has taken amoxicillin in the past.  Plan - Augmentin 875 mg two times a day x 7 days.           bland diet and lots of fluids.  Her updated medication list for this problem includes:    Reglan 10 Mg Tabs (Metoclopramide hcl) .Marland Kitchen... Take 1 tablet every night  Complete Medication List: 1)  Centrum Silver Tabs (Multiple vitamins-minerals) .... Take 1 tablet by mouth once a day 2)  Cinnamon 500 Mg Caps (Cinnamon) .... 2 capsules once daily 3)  Fluoxetine Hcl 20 Mg Caps (Fluoxetine hcl) .Marland KitchenMarland KitchenMarland Kitchen  Take 1 capsule by mouth at bedtime 4)  Indapamide 1.25 Mg Tabs (Indapamide) .... 2 once daily 5)  Klor-con M20 20 Meq Tbcr (Potassium chloride crys cr) .... Take 1 tablet by mouth once a day 6)  Levothroid 75 Mcg Tabs (Levothyroxine sodium) .Marland Kitchen.. 1 once daily 7)  Losartan Potassium 100 Mg Tabs (Losartan potassium) .Marland Kitchen.. 1 by mouth once daily 8)  Omeprazole 20 Mg Cpdr (Omeprazole) .... Take 1 capsule by mouth two times a day. 9)  Ginkgo Biloba 400 Mg Caps (Ginkgo biloba) .... Take 1 capsule by mouth once a day 10)  Calcium 600/vitamin D 600-200 Mg-unit Tabs (Calcium carbonate-vitamin d) .... Take 1 tablet by mouth two times a day 11)  Nasonex 50 Mcg/act Susp (Mometasone furoate) .... 2 puffs each nostril two times a day 12)  Mucinex Dm 30-600 Mg Xr12h-tab (Dextromethorphan-guaifenesin) .Marland Kitchen.. 1-2 every 12 hours as needed 13)  Benzonatate 100 Mg Caps (Benzonatate) .Marland Kitchen.. 1-2 every 6 hours as needed 14)  Tramadol Hcl 50 Mg Tabs (Tramadol hcl) .Marland Kitchen.. 1-2 by mouth every 4 hours if needed 15)  Chlor-trimeton 4 Mg Tabs (Chlorpheniramine maleate) .Marland Kitchen.. 1 every 6 hours as needed 16)  Phentermine Hcl 37.5 Mg Tabs (Phentermine hcl) .Marland Kitchen.. 1 by mouth qam 17)  Reglan 10 Mg Tabs  (Metoclopramide hcl) .... Take 1 tablet every night 18)  Sucralfate 1 Gm Tabs (Sucralfate) .... Take 1 by mouth twice daily. take one hour before or 2 hours after other meds and foods. 19)  Nexium 40 Mg Cpdr (Esomeprazole magnesium) .Marland Kitchen.. 1 by mouth two times a day 20)  Dilt-cd 180 Mg Xr24h-cap (Diltiazem hcl coated beads) .Marland Kitchen.. 1 once daily 21)  Amoxicillin-pot Clavulanate 875-125 Mg Tabs (Amoxicillin-pot clavulanate) .Marland Kitchen.. 1 by mouth two times a day for seven days for probable diverticulitis.  Patient Instructions: 1)  Abdominal pain - consistent with diverticulitis. Plan - Augmentin 875 mg two times a day for 7 days. Lots of fliuids and a bland diet (banannas, rice, applesauce, toast, etc). Call if you have increased pain or an inability to keep down medicines or fluids. 2)  Last colonsocopy in '08:  a biopsy was done that came back as normal colon lining. Recall for repeat study in 2015.  Prescriptions: AMOXICILLIN-POT CLAVULANATE 875-125 MG TABS (AMOXICILLIN-POT CLAVULANATE) 1 by mouth two times a day for seven days for probable diverticulitis.  #14 x 0   Entered and Authorized by:   Jacques Navy MD   Signed by:   Lamar Sprinkles, CMA on 05/06/2010   Method used:   Electronically to        [id number] ([facility])       [address]       [place], [place]  [address]       Ph: [phone]       Fax: [phone]   RxID:   [id number]    Orders Added: 1)  Est. Patient Level III [36644]      Preventive Care Screening  Mammogram:    Date:  12/17/2009    Results:  normal

## 2010-06-07 LAB — COMPREHENSIVE METABOLIC PANEL
ALT: 21 U/L (ref 0–35)
AST: 22 U/L (ref 0–37)
Alkaline Phosphatase: 72 U/L (ref 39–117)
CO2: 29 mEq/L (ref 19–32)
Chloride: 105 mEq/L (ref 96–112)
GFR calc Af Amer: >60 mL/min (ref >60)
GFR calc non Af Amer: >60 mL/min (ref >60)
Glucose, Bld: 115 mg/dL — ABNORMAL HIGH (ref 70–99)
Sodium: 142 mEq/L (ref 135–145)
Total Bilirubin: 0.4 mg/dL (ref 0.3–1.2)

## 2010-06-07 LAB — CBC
HCT: 34.5 % — ABNORMAL LOW (ref 36.0–46.0)
Hemoglobin: 11.5 g/dL — ABNORMAL LOW (ref 12.0–15.0)
MCHC: 33.3 g/dL (ref 30.0–36.0)
Platelets: 265 10*3/uL (ref 150–400)
RDW: 14.2 % (ref 11.5–15.5)

## 2010-06-07 LAB — GLUCOSE, CAPILLARY
Glucose-Capillary: 101 mg/dL — ABNORMAL HIGH (ref 70–99)
Glucose-Capillary: 109 mg/dL — ABNORMAL HIGH (ref 70–99)
Glucose-Capillary: 114 mg/dL — ABNORMAL HIGH (ref 70–99)
Glucose-Capillary: 127 mg/dL — ABNORMAL HIGH (ref 70–99)

## 2010-07-31 ENCOUNTER — Telehealth: Payer: Self-pay | Admitting: *Deleted

## 2010-07-31 NOTE — Telephone Encounter (Signed)
Patient requesting a call back to discuss med rfs she needs. - would like to req RFs to go to [facility] but did not give names of meds.

## 2010-08-01 MED ORDER — INDAPAMIDE 1.25 MG PO TABS: 2.5000 mg | ORAL_TABLET | ORAL | Status: DC

## 2010-08-01 MED ORDER — FLUOXETINE HCL 20 MG PO CAPS: 20.0000 mg | ORAL_CAPSULE | Freq: Every day | ORAL | Status: DC

## 2010-08-01 MED ORDER — POTASSIUM CHLORIDE CRYS ER 20 MEQ PO TBCR: 20.0000 meq | EXTENDED_RELEASE_TABLET | Freq: Every day | ORAL | Status: DC

## 2010-08-01 MED ORDER — LEVOTHYROXINE SODIUM 75 MCG PO TABS: 75.0000 ug | ORAL_TABLET | Freq: Every day | ORAL | Status: DC

## 2010-08-01 MED ORDER — LOSARTAN POTASSIUM 100 MG PO TABS: 100.0000 mg | ORAL_TABLET | Freq: Every day | ORAL | Status: DC

## 2010-08-01 MED ORDER — DILTIAZEM HCL ER COATED BEADS 180 MG PO CP24: 180.0000 mg | ORAL_CAPSULE | Freq: Every evening | ORAL | Status: DC

## 2010-08-01 NOTE — Telephone Encounter (Signed)
Spoke w/pt - She needs 6 rx's to go to [facility], printed & faxed to [phone], Pt's ID # is [id number]

## 2010-08-04 NOTE — Letter (Signed)
October 25, 2005     Frederick A. Terri Piedra, MD  [phone] [place], [place]  [address]   RE:  CHANETTE, DEMO  MRN:  [id number]  /  DOB:  1941-03-09   Dear Merlyn Albert,   Thank you very much for seeing Ms. Madison for a rash across her forehead  and face.   The patient reports that she developed this rash approximately 2 weeks ago  with sudden onset.  It was initially mildly pruritic.  It has been a  maculopapular, dark-color rash.  She reports she has been using vitamin E  oil as well as some Eucerin cream but continues to have a rash.  She had  exposure to azithromycin about 1 week prior to the rash erupting, but had  never had a drug intolerance in the past.  The patient also had been started  on Crestor 5 mg q.p.m. back on September 04, 2005.   The patient's past medical history is significant for insulin-dependent  diabetes, hyperlipidemia, DJD and DDD of the cervical spine, hypertension,  hypothyroid disease, generalized anxiety disorder and sleep apnea.   Surgical history includes repair of a rectocele with an AP bladder  suspension, TAH/BSO, lumpectomy of the left breast that was benign,  laparoscopic cholecystectomy, and a tonsillectomy at age 70 years with repeat  at age 44.   Current medications are Diovan 160 mg daily, diapamide 1.25 mg daily, L-  thyroxine 50 mcg daily, Zyrtec 10 mg daily, aspirin 81 mg daily, gingko  biloba 30 mg b.i.d., calcium with D b.i.d., omega-3 oil 1000 mg t.i.d., and  garlic.  She was taking red yeast rice, fluoxetine 20 mg daily, __________  XT 180 mg daily, and Crestor 5 mg daily.   The patient does have allergies to HYDROCHLOROTHIAZIDE, SHELLFISH,  PENICILLIN, and CODEINE.   Her examination in the office today is unremarkable with normal vital signs  and a blood pressure of 106/80.  This is a heavy-set African American woman  that is in no distress.  Dermatologically, the patient does have what  appears to be an erythematous macular rash  that is over her entire forehead  and down both cheeks to the upper part of her neck.  She has no rash  anywhere else on her body.   As always, I appreciate your assistance in the care of my patients and look  forward to hearing from you.    Sincerely,      Rosalyn Gess. Norins, MD   MEN/MedQ  DD:  10/25/2005  DT:  10/25/2005  Job #:  [id number]

## 2010-08-04 NOTE — Op Note (Signed)
NAMEELLEANA, STILLSON NO.:  [id number]   MEDICAL RECORD NO.:  [id number]          PATIENT TYPE:  AMB   LOCATION:  DAY                          FACILITY:  [facility]   PHYSICIAN:  Velora Heckler, MD      DATE OF BIRTH:  11/13/1940   DATE OF PROCEDURE:  06/06/2006  DATE OF DISCHARGE:                               OPERATIVE REPORT   PREOPERATIVE DIAGNOSIS:  Ventral incisional hernia.   POSTOPERATIVE DIAGNOSIS:  Ventral incisional hernia.   PROCEDURE:  Laparoscopic repair of ventral incisional hernia with  polyester mesh.   SURGEON:  Velora Heckler, M.D., FACS   ANESTHESIA:  General per Dr. Ronelle Nigh.   ESTIMATED BLOOD LOSS:  Minimal.   PREPARATION:  Hibiclens.   COMPLICATIONS:  None.   INDICATIONS:  The patient is a 70 year old black female who has  previously undergone laparoscopic cholecystectomy and laparoscopic  appendectomy.  She developed abdominal pain.  A CT scan abdomen and  pelvis performed April 19, 2006, at [facility] demonstrates a  ventral incisional hernia containing omental fat.  The patient now comes  to surgery for laparoscopic repair.   Procedure is done in OR #11 at the [facility].  The  patient is brought to the operating room, placed in the supine position  on the operating room table.  Following administration of general  anesthesia the patient is prepped and draped in the usual strict aseptic  fashion.  After ascertaining that an adequate level of anesthesia had  been obtained, an incision is made at the left costal margin in the  anterior axillary line.  Using an Optiview trocar and the laparoscope,  the peritoneal cavity is entered cautiously.  Scope is removed and the  abdomen is insufflated with carbon dioxide.  Scope is reinserted and the  abdomen explored.  There are omental adhesions in a ventral incisional  hernia in the midline just above the level of the umbilicus.  Operative  ports are  placed in the left lower quadrant, right lower quadrant with 5  mm trocars.  While retracting the omentum, the tissue is freed from the  hernia sac with the Harmonic scalpel.  Good hemostasis is noted.  Dissection is carried out to the level of the falciform ligament.  This  frees the entire abdominal wall.  A second 10 mm port is placed in the  right upper quadrant.  Defect is measured utilizing a spinal needle.  A  4 cm overlap is made around the defect circumferentially.  A 12 x 12 cm  round polyester mesh is selected.  The 0 Novofil sutures are placed at  the 4 cardinal points of the circle.  Mesh is then damped with saline,  rolled and inserted into the peritoneal cavity where it is deployed.  Using an endo catch suture retrieval device, the sutures are retrieved  and brought through the abdominal wall.  After all 4 sutures are  retrieved, the sutures are pulled taut to elevate the mesh against the  peritoneal surface.  There is broad coverage of the defect with generous  overlap  circumferentially.  Sutures are tied securely.  Mesh is then  affixed to the abdominal wall with two concentric circles of Ethicon  titanium tacks.  Good hemostasis is noted.  Ports are removed under  direct vision.  Good hemostasis is noted at port sites.  Pneumoperitoneum is released.  Mesh is visualized as it comes in contact  with the abdominal viscera.  Remaining port is removed as is the scope.  Port sites are anesthetized with local anesthetic.  Wounds are closed  with interrupted #4-0 Vicryl subcuticular sutures.  All wounds are  washed, dried and benzoin and Steri-Strips are applied, sterile  dressings are applied, abdominal binder is placed around the abdomen.  Patient is awakened from anesthesia and brought to the recovery room in  stable condition.  The patient tolerated the procedure well.      Velora Heckler, MD  Electronically Signed     TMG/MEDQ  D:  06/06/2006  T:  06/06/2006  Job:   [id number]   cc:   Rosalyn Gess. Norins, MD  [address]  [place]  [place] [address]

## 2010-08-04 NOTE — Op Note (Signed)
[name], [name] NO.:  [id number]   MEDICAL RECORD NO.:  [id number]          PATIENT TYPE:  INP   LOCATION:  1611                         FACILITY:  [facility]   PHYSICIAN:  Velora Heckler, MD      DATE OF BIRTH:  1940/08/30   DATE OF PROCEDURE:  06/14/2005  DATE OF DISCHARGE:  06/16/2005                                 OPERATIVE REPORT   PREOPERATIVE DIAGNOSIS:  Acute appendicitis.   POSTOPERATIVE DIAGNOSIS:  Acute appendicitis.   PROCEDURE:  Laparoscopic appendectomy.   SURGEON:  Velora Heckler, M.D., FACS   ANESTHESIA:  General.   ESTIMATED BLOOD LOSS:  Minimal.   PREPARATION:  Betadine.   COMPLICATIONS:  None.   INDICATIONS:  The patient is a 70 year old black female who presents at the  request of Dr. Royann Shivers to the Emergency Department with a 24-hour  history of abdominal pain localized to the right lower quadrant.  This was  associated with nausea and vomiting.  The patient was evaluated.  Laboratory  studies showed an elevated white count of 14.9 with a left shift.  CT scan  of the abdomen and pelvis had findings consistent with acute appendicitis.  General surgery was consulted.  The patient was prepared for the operating  room.   PROCEDURES PERFORMED:  The procedure was done in OR #1 at the [facility].  The patient is brought to the operating room and placed  in a supine position on the operating room table.  Following the  administration of general anesthesia the patient was prepped and draped in  usual strict aseptic fashion.  After ascertaining that an adequate level of  anesthesia been obtained, a supraumbilical incision was made in the midline  with a #15 blade.  Dissection was carried down to the fascia.  Fascia is  incised in the midline.  The peritoneal cavity was entered cautiously.  A 0  Vicryl pursestring suture was placed in the fascia.  A Hasson cannula was  introduced and secured with a pursestring  suture.  Abdomen was insufflated  with carbon dioxide.  Laparoscope was introduced and the abdomen explored.  Operative ports were placed in the right upper quadrant and left lower  quadrant.  Appendix was acutely inflamed.  It was adherent to the base of  the cecum and extending over the pelvic brim.  Using the harmonic scalpel,  the cecum was mobilized from its lateral peritoneal attachments.  The base  of the appendix was delineated.  The appendix was gently dissected off of  the cecal wall and off of the retroperitoneum.  Mesoappendix was divided  with the harmonic scalpel.  A window is created at the base of the appendix.  The base of the appendix was transected with an Endo-GIA stapler.  Good  hemostasis was noted at the staple line.  Remaining mesoappendix was divided  with harmonic scalpel, and the appendix was completely excised.  It was  placed into an EndoCatch bag and withdrawn through the left lower quadrant  port without difficulty.  The right lower quadrant was copiously irrigated  with warm saline which was evacuated.  Good hemostasis was obtained along  the mesoappendix with the Harmonic scalpel.  Fluid is evacuated.  Ports were  removed under direct vision.  Good hemostasis was noted at port sites.  Pneumoperitoneum was released, and all ports were removed.  Port sites were  anesthetized with  local anesthetic.  All wounds were closed with interrupted 4-0 Monocryl  subcuticular sutures.  Wounds washed and dried.  Benzoin and Steri-Strips  were applied.  Sterile dressings were applied.  The patient is awakened from  anesthesia and brought to the recovery room in stable condition.  The  patient tolerated the procedure well.      Velora Heckler, MD  Electronically Signed     TMG/MEDQ  D:  06/14/2005  T:  06/16/2005  Job:  [id number]   cc:   Rosalyn Gess. Norins, M.D. [facility]  [address]  [place]  [place] [address]

## 2010-08-04 NOTE — Letter (Signed)
April 10, 2006    [name], M.D.  [phone]  [place], [place] [address]   RE:  [name]  MRN:  [id number]  /  DOB:  Jun 14, 1940   Dear Tawanna Cooler,   Thank you very much for seeing [name], a very delightful 70-year-  old African-American woman for left lower quadrant abdominal pain.   [name] came to your good care in March of 2007 when she had acute  appendicitis which you operated on her for, doing a laparoscopic  appendectomy which ws uneventful.   The patient presented to the office today reporting a several week  history of progressively increasing pain in the left lower quadrant of  her abdomen. At this time, her pain is constant. I did review her CT  scan of the abdomen and pelvic from March of 2007 which was normal at  that time except for signs and changes of her acute appendicitis. The  patient reports she had a colonoscopy approximately 5 years ago. Did  have multiple polyps removed but had recollection of being told she had  diverticulosis.   The patient has had no fevers, no chills. She has had no nausea, no  vomiting. She had hemodynamically stable. She reports she is having a  good bowel habit with no blood or mucus in the stool, and the stool is  normal size, caliber and color.   As way of refresher, her past medical history is significant for a  rectocele repair, AP bladder suspension, TAH/BSO of 73, lumpectomy left  breast in 1996, laparoscopic cholecystectomy in 1998, appendectomy in  March of 2007. She did have tonsillectomy as a child.   Medical illnesses include noninsulin-dependent diabetes, hyperlipidemia,  known cervical spine disease with degenerative joint and degenerative  disk disease, hypertension, hypothyroid disease, generalized anxiety  disorder, and she does have a problem with sleep apnea.   CURRENT MEDICATIONS:  1. Diovan 160 mg daily.  2. Indapamide 1.25 mg daily.  3. L-thyroxine 50 mcg daily.  4. Zyrtec 10 mg  daily p.r.n.  5. Aspirin 81 mg daily.  6. Diltia XT 180 mg daily.  7. Potassium.  8. She takes gingko biloba.  9. Calcium.  10.Omega 3.  11.Garlic.  12.The patient takes Elavil 25 mg every night.   PHYSICAL EXAMINATION:  Today, temperature 97.1, blood pressure was not  obtained, weight was 188 pounds.  GENERAL APPEARANCE:  This is a heavy set African-American woman. She is  no acute distress.  ABDOMEN:  The patient had positive bowel sounds in all four quadrants.  She has well-healed surgical scars. She had no guarding or rebound. She  did have significant tenderness to palpation in the left lower quadrant.  She had no signs of peritonitis.   The patient is afebrile, and I do not believe she has any evidence to  suggest acute diverticulitis. I will sending her laboratory today for  CBC with differential to make sure this is the case. I also checked  general chemistries while she is in lab. My primary concern is whether  she is having progressive problems with adhesions, although there is no  evidence of obstruction at this time.   With the patient being generally stable with no evidence of obstruction  and unlikely to have a diverticulitis, I have not ordered any imaging  studies, specifically no CT abdomen and pelvis. I will defer to your  judgment in this regard as to whether this will beneficial or helpful to  you in her evaluation.   If I can provide any additional information that will help in evaluating  [name], please do not hesitate to contact me. As always, I  appreciate your help in assisting in the care of my nice patients and  look forward to hearing from you.    Sincerely,      Rosalyn Gess. Norins, MD  Electronically Signed    MEN/MedQ  DD: 04/10/2006  DT: 04/10/2006  Job #: [id number]

## 2010-08-04 NOTE — H&P (Signed)
[name], Mathews NO.:  [id number]   MEDICAL RECORD NO.:  [id number]          PATIENT TYPE:  INP   LOCATION:  1611                         FACILITY:  [facility]   PHYSICIAN:  Velora Heckler, MD      DATE OF BIRTH:  Oct 14, 1940   DATE OF ADMISSION:  06/14/2005  DATE OF DISCHARGE:                                HISTORY & PHYSICAL   REFERRING PHYSICIAN:  Rosalyn Gess. Mathews, M.D.   CHIEF COMPLAINT:  Abdominal pain, acute appendicitis.   HISTORY OF PRESENT ILLNESS:  The patient is a 70 year old black female from  [place], [place], who presents to the emergency department at the  request of Dr. Illene Regulus for newly diagnosed acute appendicitis.  The  patient had developed abdominal pain early on the morning of June 14, 2005.  This localized to the right lower quadrant.  It was associated with one  episode of nausea and vomiting.  The patient was seen in the office at  [facility].  Laboratory studies showed elevated white blood cell  count with a left shift on differential.  The patient was sent to [facility] where CT scan abdomen and pelvis confirmed findings consistent  with acute appendicitis.  The patient was transported to the emergency  department, and general surgery was consulted.   PAST MEDICAL HISTORY:  1.  History of rectocele repair.  2.  History of bladder suspension.  3.  Status post abdominal hysterectomy.  4.  Status post laparoscopic cholecystectomy performed in 1998 in [place].  5.  Status post tonsillectomy as a child.  6.  Status post lumpectomy left breast 1996 in [place], followed at the      [facility] and by Dr. Cyndia Bent in our practice.  7.  History of hypothyroidism.  8.  History of sleep apnea.  9.  History of hypertension.   MEDICATIONS:  Diovan, indapamide, thyroxine, Zyrtec, aspirin, vitamin  supplements.   ALLERGIES:  1.  PENICILLIN causing rash.  2.  CODEINE causing mental  status changes.  3.  SHELLFISH causing rash.  4.  BETADINE causing itching.   SOCIAL HISTORY:  Patient is accompanied by her daughter.  She lives in  [place].  She denies tobacco use.  She notes occasional alcohol use.   FAMILY HISTORY:  Noncontributory.   REVIEW OF SYSTEMS:  15-system review without significant other positives   PHYSICAL EXAMINATION:  GENERAL:  A 70 year old, bright, alert, black female  on a stretcher in the hallway in the emergency department at [facility].  VITAL SIGNS: Temperature 97.2, pulse 84, respirations 70, blood pressure  140/82.  HEENT: Shows her to be normocephalic, atraumatic.  Sclerae clear.  Conjunctiva clear.  Dentition fair.  Mucous membranes moist.  Voice is  normal.  NECK: Anterior examination of the neck shows it to be symmetric. Palpation  of thyroid shows no nodularity.  There is no lymphadenopathy.  There is no  tenderness.  LUNGS: Clear to auscultation bilaterally without rales, rhonchi or wheeze.  CARDIAC:  Exam shows regular rate and rhythm without  significant murmur.  Peripheral pulses are full.  ABDOMEN: Soft obese with well-healed surgical wounds.  There are bowel  sounds present.  There is tenderness to percussion and palpation,  particularly in the right lower quadrant.  There is voluntary guarding.  There is mild rebound tenderness.  There is no palpable mass.  EXTREMITIES:  Nontender without edema.  NEUROLOGICALLY:  The patient is alert and oriented without focal deficit.   LABORATORY STUDIES FROM [facility]:  White blood cell count 14.9,  hemoglobin 13.3, hematocrit 39.9%, platelet count 322,000.  Differential  shows 94% neutrophils, 5% lymphocytes.  Electrolytes were normal.  Amylase  and lipase were normal.   RADIOGRAPHIC STUDIES:  CT scan abdomen and pelvis showing a 1.4 cm inflamed  appendix consistent with acute appendicitis.  There are reactive lymph nodes  in the ileocolic ligament.    IMPRESSION:  Acute appendicitis.   PLAN:  1.  Admission to [facility].  2.  Initiation of intravenous antibiotics.  3.  To operating room for appendectomy.  4.  Routine postoperative care.   The patient and I and her daughter had a discussion regarding appendectomy.  I discussed laparoscopic technique.  I quoted them approximately an 85%  chance of success by laparoscopic technique and approximately a 15% chance  of conversion to open surgery.  I explained the postoperative stay to be  expected and her recovery and return to activities.  They understand and  wish to proceed.  We will make arrangements with the operating room  immediately.      Velora Heckler, MD  Electronically Signed     TMG/MEDQ  D:  06/14/2005  T:  06/16/2005  Job:  [id number]   cc:   [name], M.D. [facility]  [address]  [place]  [place] [address]   Currie Paris, M.D.  [address]  [place]  [place] [address]

## 2010-08-30 ENCOUNTER — Other Ambulatory Visit: Payer: Self-pay | Admitting: Internal Medicine

## 2010-08-30 DIAGNOSIS — Z1231 Encounter for screening mammogram for malignant neoplasm of breast: Secondary | ICD-10-CM

## 2010-09-14 ENCOUNTER — Telehealth: Payer: Self-pay | Admitting: *Deleted

## 2010-09-14 MED ORDER — ALPRAZOLAM 0.25 MG PO TABS: 0.2500 mg | ORAL_TABLET | Freq: Four times a day (QID) | ORAL | Status: DC | PRN

## 2010-09-14 NOTE — Telephone Encounter (Signed)
k

## 2010-09-14 NOTE — Telephone Encounter (Signed)
Called in, Patient informed  

## 2010-09-14 NOTE — Telephone Encounter (Signed)
Pt is req RF of xanax 0.25 mg 1 q 6 prn #90. [facility] [address] (Pt would like a call when complete)

## 2010-10-23 ENCOUNTER — Encounter: Payer: Self-pay | Admitting: Internal Medicine

## 2010-10-24 ENCOUNTER — Other Ambulatory Visit: Payer: Self-pay | Admitting: Internal Medicine

## 2010-10-24 ENCOUNTER — Ambulatory Visit (INDEPENDENT_AMBULATORY_CARE_PROVIDER_SITE_OTHER): Payer: Medicare PPO | Admitting: Internal Medicine

## 2010-10-24 ENCOUNTER — Other Ambulatory Visit (INDEPENDENT_AMBULATORY_CARE_PROVIDER_SITE_OTHER): Payer: Medicare PPO

## 2010-10-24 DIAGNOSIS — D539 Nutritional anemia, unspecified: Secondary | ICD-10-CM | POA: Insufficient documentation

## 2010-10-24 DIAGNOSIS — E785 Hyperlipidemia, unspecified: Secondary | ICD-10-CM

## 2010-10-24 DIAGNOSIS — K219 Gastro-esophageal reflux disease without esophagitis: Secondary | ICD-10-CM

## 2010-10-24 DIAGNOSIS — L299 Pruritus, unspecified: Secondary | ICD-10-CM

## 2010-10-24 DIAGNOSIS — E119 Type 2 diabetes mellitus without complications: Secondary | ICD-10-CM

## 2010-10-24 DIAGNOSIS — E039 Hypothyroidism, unspecified: Secondary | ICD-10-CM

## 2010-10-24 DIAGNOSIS — I10 Essential (primary) hypertension: Secondary | ICD-10-CM

## 2010-10-24 DIAGNOSIS — D649 Anemia, unspecified: Secondary | ICD-10-CM

## 2010-10-24 DIAGNOSIS — R1310 Dysphagia, unspecified: Secondary | ICD-10-CM

## 2010-10-24 DIAGNOSIS — Z1231 Encounter for screening mammogram for malignant neoplasm of breast: Secondary | ICD-10-CM

## 2010-10-24 DIAGNOSIS — Z79899 Other long term (current) drug therapy: Secondary | ICD-10-CM

## 2010-10-24 LAB — CBC WITH DIFFERENTIAL/PLATELET
Basophils Absolute: 0 10*3/uL (ref 0.0–0.1)
Eosinophils Absolute: 0.2 10*3/uL (ref 0.0–0.7)
Lymphocytes Relative: 26.9 % (ref 12.0–46.0)
MCHC: 33.2 g/dL (ref 30.0–36.0)
Neutrophils Relative %: 61.4 % (ref 43.0–77.0)
Platelets: 351 10*3/uL (ref 150.0–400.0)
RDW: 15.4 % — ABNORMAL HIGH (ref 11.5–14.6)

## 2010-10-24 LAB — HEMOGLOBIN A1C: Hgb A1c MFr Bld: 6.2 % (ref 4.6–6.5)

## 2010-10-24 NOTE — Patient Instructions (Signed)
Itching palms and soles - will check for metabolic causes by checking lab. For symptoms you can take claritin or allegra (generics) and pepcid or zantac.  For swallow -problems: this may be recurrent reflux. Plan - take prilosec 2 tablets once a day. Will refer you to Dr. Juanda Chance for evaluation.  Will check your diabetes and cholesterol levels.   Sense of smell seems normal for your age.  Nails - the lines are a normal variant and unlikely to be pathologic.  For sleep - ok to take melatonin and/or camoimille tea. Let me know if this doesn't help.  I will send you a letter about your lab results.

## 2010-10-25 LAB — LDL CHOLESTEROL, DIRECT: Direct LDL: 172.7 mg/dL

## 2010-10-25 LAB — HEPATIC FUNCTION PANEL
ALT: 18 U/L (ref 0–35)
Bilirubin, Direct: 0.1 mg/dL (ref 0.0–0.3)
Total Bilirubin: 0.6 mg/dL (ref 0.3–1.2)

## 2010-10-25 LAB — COMPREHENSIVE METABOLIC PANEL
Albumin: 4.2 g/dL (ref 3.5–5.2)
Alkaline Phosphatase: 67 U/L (ref 39–117)
BUN: 22 mg/dL (ref 6–23)
Glucose, Bld: 97 mg/dL (ref 70–99)
Potassium: 4.2 mEq/L (ref 3.5–5.1)
Total Bilirubin: 0.6 mg/dL (ref 0.3–1.2)

## 2010-10-25 LAB — IBC PANEL
Iron: 93 ug/dL (ref 42–145)
Saturation Ratios: 25.5 % (ref 20.0–50.0)
Transferrin: 260.3 mg/dL (ref 212.0–360.0)

## 2010-10-25 LAB — LIPID PANEL
HDL: 69 mg/dL (ref >39.00)
Triglycerides: 104 mg/dL (ref 0.0–149.0)

## 2010-10-25 LAB — VITAMIN B12: Vitamin B-12: 794 pg/mL (ref 211–911)

## 2010-10-25 LAB — RETICULOCYTES: RBC.: 4.08 MIL/uL (ref 3.87–5.11)

## 2010-10-26 ENCOUNTER — Encounter: Payer: Self-pay | Admitting: Internal Medicine

## 2010-10-26 DIAGNOSIS — L299 Pruritus, unspecified: Secondary | ICD-10-CM | POA: Insufficient documentation

## 2010-10-26 DIAGNOSIS — R1310 Dysphagia, unspecified: Secondary | ICD-10-CM | POA: Insufficient documentation

## 2010-10-26 NOTE — Progress Notes (Signed)
Subjective:    Patient ID: [name], female    DOB: 1940-09-26, 70 y.o.   MRN: [id number]  HPI [name] present with a 3 week h/o mild dysphagia for solids. She is s/p fundoplication in July '11. Her dysphagia is described as a sense of something that is in her throat blocking passage of solids. She has had no trouble with liquids and has not had any episodes of regurgitation. Since her surgery she has not been taking any acid suppressing medication and has not had any reflux symptoms. She does admit to allergy problems with sinus drainage.  For the past months she reports that she has had itching of the soles of her feet and palms of her hands that has not been relieved by lotions or otc cortisone. There has been no new exposure to any allergens, no new foods or lotions or creams or soaps. The has been no visible rash. She has not had any fevers, chills, jaundice, abdominal pain, night sweats or weight loss.  Also for the past several weeks she has had diminished sense of smell, although she denies any change in her sense of taste. She denies sinus congestion, purulent rhinorrhea, sore throat, sore mouth or presence of ulcers.  Lastly, she reports that she has been light headed while walking on several occasions but not all the time. No SOB/DOE, palpitations, chest pain or near syncope.  Past Medical History  Diagnosis Date  . Hypertension   . Hypothyroidism   . Sleep apnea     Dr Shelle Iron CPAP 2009  . Depression   . Diabetes mellitus type II     diet  . Allergic rhinitis   . Anxiety   . Chronic cough     Dr Sherene Sires, onset 11/2007, Sinus CT July 7,2010>neg, Resolved 10/2008 on ppi two times a day  . Obesity (BMI 30-39.9)   . Pneumonia   . Hiatal hernia   . GERD (gastroesophageal reflux disease)    Past Surgical History  Procedure Date  . Left breast lumpectomy   . Tonsillectomy     x 2  . Cholecystectomy     1998 in [place]  . S/p rectocele repair   . Hx of bladder  suspension   . Ventral hernia repair   . Appendectomy   . Abdominal hysterectomy   . Esophagogastric fundoplication     March '11 Dr Michaell Cowing   Family History  Problem Relation Age of Onset  . Heart disease Mother   . Cancer Father     lung cancer   History   Social History  . Marital Status: Single    Spouse Name: N/A    Number of Children: N/A  . Years of Education: N/A   Occupational History  . reited Engineer, petroleum    Social History Main Topics  . Smoking status: Former Smoker -- 1.5 packs/day for 25 years    Types: Cigarettes    Quit date: 10/28/1985  . Smokeless tobacco: Never Used  . Alcohol Use: No  . Drug Use: No  . Sexually Active: Not Currently   Other Topics Concern  . Not on file   Social History Narrative   HSG. Married -divorced. 2 sons. 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 Patient does not get regular exercise. Loves to read. Strong faith.        Review of Systems Review of Systems  Constitutional:  Negative for fever, chills, activity change and unexpected weight change.  HEENT:  Negative for  hearing loss, ear pain, congestion, neck stiffness. Negative for sore throat. Negative for dental complaints.  Positive for post-nasal drip. Eyes: Negative for vision loss or change in visual acuity.  Respiratory: Negative for chest tightness and wheezing.   Cardiovascular: Negative for chest pain and palpitation. Positive for decreased exercise tolerance Gastrointestinal: No change in bowel habit. No bloating or gas. No reflux or indigestion Genitourinary: Negative for urgency, frequency, flank pain and difficulty urinating.  Musculoskeletal: Negative for myalgias, back pain, arthralgias and gait problem.  Neurological: Negative for dizziness, tremors, weakness and headaches.  Hematological: Negative for adenopathy.  Psychiatric/Behavioral: Negative for behavioral problems and dysphoric mood.       Objective:   Physical Exam  Vitals  noted  Gen'l - short-stature, overweight AA woman in no distress. HEENT - oropharynx normal, posterior pahyarynx normal.  Chest - clear Cor - RRR Abd- BS+ , soft Derm - no jaundice, no rash on the hands or feet. Reedy nails - vertical ridges on the nails, a new development over the past year or two. No splinter hemorrhages.  Lab Results  Component Value Date   WBC 7.7 10/24/2010   HGB 11.8* 10/24/2010   HCT 35.5* 10/24/2010   PLT 351.0 10/24/2010   CHOL 240* 10/24/2010   TRIG 104.0 10/24/2010   HDL 69.00 10/24/2010   LDLDIRECT 172.7 10/24/2010   ALT 18 10/24/2010   ALT 18 10/24/2010   AST 23 10/24/2010   AST 23 10/24/2010   NA 146* 10/24/2010   K 4.2 10/24/2010   CL 107 10/24/2010   CREATININE 1.0 10/24/2010   BUN 22 10/24/2010   CO2 30 10/24/2010   TSH 0.99 10/24/2010   HGBA1C 6.2 10/24/2010         Assessment & Plan:

## 2010-10-26 NOTE — Assessment & Plan Note (Signed)
Hgb 11.8 stable with normal iron levels.

## 2010-10-26 NOTE — Assessment & Plan Note (Signed)
Thyroid function is normal based on lab results and not a likely cause of pruritus.

## 2010-10-26 NOTE — Assessment & Plan Note (Signed)
Patient with a h/o GERD that was severe and lead to fundoplication. She is now having recurrent dysphagia without symptoms of reflux. Concern for silent reflux with development of stricture vs stricture related to surgery vs mass/lesion in a former smoker  Plan - PPI therapy - prilosec 2 caps daily           Refer to GI for evaluation - established patient with Dr. Juanda Chance.

## 2010-10-26 NOTE — Assessment & Plan Note (Signed)
Due for lab with recommendations to follow.  Addendum - LDL 172!  Plan - will need to start "statin" or other therapy

## 2010-10-26 NOTE — Assessment & Plan Note (Signed)
BP Readings from Last 3 Encounters:  10/24/10 118/76  05/05/10 126/82  08/11/09 122/74   Good control on present medicaiton

## 2010-10-26 NOTE — Assessment & Plan Note (Signed)
Patient with itching of palms and soles with no visible rash  Plan - r/o metabolic derangement: hepatic panel, Bmet, thyroid disease           otc antihistamine for symptoms plus H2 blocker if needed  Addendum - lab results are normal.

## 2010-10-26 NOTE — Assessment & Plan Note (Signed)
Lab Results  Component Value Date   HGBA1C 6.2 10/24/2010   Good control of diabetes.  Plan - continue present regimen

## 2010-10-30 ENCOUNTER — Encounter: Payer: Self-pay | Admitting: Internal Medicine

## 2010-12-04 ENCOUNTER — Encounter: Payer: Self-pay | Admitting: Internal Medicine

## 2010-12-04 ENCOUNTER — Ambulatory Visit (INDEPENDENT_AMBULATORY_CARE_PROVIDER_SITE_OTHER): Payer: Medicare PPO | Admitting: Internal Medicine

## 2010-12-04 DIAGNOSIS — R1319 Other dysphagia: Secondary | ICD-10-CM

## 2010-12-04 DIAGNOSIS — K219 Gastro-esophageal reflux disease without esophagitis: Secondary | ICD-10-CM

## 2010-12-04 MED ORDER — AMBULATORY NON FORMULARY MEDICATION: Status: DC

## 2010-12-04 NOTE — Patient Instructions (Addendum)
You have been scheduled for a Barium Esophogram and Upper GI series at [facility] (1st floor of the hospital) on 12/11/10 Monday at 1:30 pm. Please arrive 15 minutes prior to your appointment for registration. Make certain not to have anything to eat or drink 6 hours prior to your test. If you need to reschedule for any reason, please contact radiology at [phone] to do so. We have sent the following medications to your pharmacy for you to pick up at your convenience: Humabid (guafenisin) 600 mg. Take 1 tablet by mouth twice daily. CC: Dr Romona Curls , Dr Sherene Sires

## 2010-12-04 NOTE — Progress Notes (Signed)
[name] [birth year] MRN [id number]     History of Present Illness:  This is a 70 year old African American female with severe gastroesophageal reflux. She is status post Nissen fundoplication in July 2011 with complete relief of her symptoms until April of this year when she started to have problems with secretions and cough. She still denies heartburn or dysphagia. She has started taking Prilosec 20 mg daily with breakthrough symptoms. Her last upper endoscopy in November 2010 showed esophagitis but no evidence of Barrett's esophagus. An upper GI series in December 2010 prior to the surgery showed mild esophageal dysmotility but no stricture. She had a small hiatal hernia and free gastroesophageal reflux. Additional medical problems include her being overweight, having sleep apnea and bronchitis.   Past Medical History  Diagnosis Date  . Hypertension   . Hypothyroidism   . Sleep apnea     Dr Shelle Iron CPAP 2009  . Depression   . Diabetes mellitus type II     diet  . Allergic rhinitis   . Anxiety   . Chronic cough     Dr Sherene Sires, onset 11/2007, Sinus CT July 7,2010>neg, Resolved 10/2008 on ppi two times a day  . Obesity (BMI 30-39.9)   . Pneumonia   . Hiatal hernia   . GERD (gastroesophageal reflux disease)   . Dyslipidemia   . Lung nodule   . Diverticulosis   . Colon polyp   . Arthritis    Past Surgical History  Procedure Date  . Left breast lumpectomy   . Tonsillectomy     x 2  . Cholecystectomy     1998 in [place]  . Rectocele repair   . Bladder suspension   . Ventral hernia repair   . Appendectomy   . Abdominal hysterectomy   . Nissen fundoplication     March '11 Dr Michaell Cowing    reports that she quit smoking about 25 years ago. Her smoking use included Cigarettes. She has a 37.5 pack-year smoking history. She has never used smokeless tobacco. She reports that she does not drink alcohol or use illicit drugs. family history includes Heart disease in her mother; Lung  cancer in her father; and Stroke in her mother. Allergies  Allergen Reactions  . Codeine Sulfate   . Hydrochlorothiazide   . Iodine   . Iohexol      Code: HIVES, Desc: PATIENT STATES SHE BREAKS OUT IN HIVES FROM IV DYE 02/27/08/RM, Onset Date: [id number]   . Paroxetine   . Penicillins   . Prednisone         Review of Systems: Occasional dysphagia, denies heartburn. Positive for cough and increased secretions negative for change in bowel habit  The remainder of the 10  point ROS is negative except as outlined in H&P   Physical Exam: General appearance  Well developed, in no distress, normal voice, frequent cough. Eyes- non icteric. HEENT nontraumatic, normocephalic. Mouth no lesions, tongue papillated, no cheilosis. Neck supple without adenopathy, thyroid not enlarged, no carotid bruits, no JVD. Lungs Clear to auscultation bilaterally. Cor normal S1 normal S2, regular rhythm , no murmur,  quiet precordium. Abdomen obese soft abdomen no tenderness. Rectal: Not done. Extremities no pedal edema. Skin no lesions. Neurological alert and oriented x 3. Psychological normal mood and affect.  Assessment and Plan:   Problem #1 gastroesophageal reflux disease for which patient is status post Nissen fundoplication. She is now one year post surgery. She is having problems with secretions and cough. We will obtain a barium  esophagram and upper GI series to determine the possiblity of reflux and esophageal motility. Her secretions may be secondary to bronchitis. We will start her on Humibid 600 mg twice a day and switch her from Prilosec to Nexium 40 mg daily.  Problem #2 colorectal screening. Patient's last colonoscopy was in July 2008 at which time she had a small polyp. A recall colonoscopy will be due in July 2013.  12/04/2010 [name]

## 2010-12-05 ENCOUNTER — Telehealth: Payer: Self-pay | Admitting: Internal Medicine

## 2010-12-05 NOTE — Telephone Encounter (Signed)
Reviewed and agree.

## 2010-12-05 NOTE — Telephone Encounter (Signed)
Patient calling back to state that she takes a few blood pressure medications daily and wonders if they could be causing her cough. Upon speaking with her, I asked if she had been on these hypertension medications for a long period of time. She states that she has been on them for several years but that her cough started in April. I have advised that although losartan and some bp meds can cause cough, it is unlikely that her cough is from the bp meds if she just started experiencing symptoms recently. Advised that she has history of bronchitis and some other "breathing" symptoms which is more likely associated with the cough. I have asked the patient to keep scheduled appointment for Barium swallow with upper GI series to rule out GI issues and to take the Humibid as directed by Dr Juanda Chance to see if the cough subsides. Advised after these measures have been taken, we can then look into other causes of cough if she continues to be symptomatic. Patient verbalizes understanding.

## 2010-12-11 ENCOUNTER — Other Ambulatory Visit (HOSPITAL_COMMUNITY): Payer: Medicare PPO

## 2010-12-15 ENCOUNTER — Ambulatory Visit (HOSPITAL_COMMUNITY)
Admission: RE | Admit: 2010-12-15 | Discharge: 2010-12-15 | Disposition: A | Discharge status: Home / Self Care | Payer: Medicare PPO | Source: Ambulatory Visit | Attending: Internal Medicine | Admitting: Internal Medicine

## 2010-12-15 ENCOUNTER — Other Ambulatory Visit (HOSPITAL_COMMUNITY): Payer: Medicare PPO

## 2010-12-15 ENCOUNTER — Ambulatory Visit (INDEPENDENT_AMBULATORY_CARE_PROVIDER_SITE_OTHER): Payer: Medicare PPO | Admitting: *Deleted

## 2010-12-15 DIAGNOSIS — Z23 Encounter for immunization: Secondary | ICD-10-CM

## 2010-12-15 DIAGNOSIS — R1319 Other dysphagia: Secondary | ICD-10-CM

## 2010-12-15 DIAGNOSIS — K219 Gastro-esophageal reflux disease without esophagitis: Secondary | ICD-10-CM

## 2010-12-15 DIAGNOSIS — K224 Dyskinesia of esophagus: Secondary | ICD-10-CM | POA: Insufficient documentation

## 2010-12-18 ENCOUNTER — Telehealth: Payer: Self-pay | Admitting: *Deleted

## 2010-12-18 NOTE — Telephone Encounter (Signed)
I agree that her secretions are not likely due to reflux.

## 2010-12-18 NOTE — Telephone Encounter (Signed)
Message copied by Daphine Deutscher on Mon Dec 18, 2010  9:19 AM ------      Message from: Hart Carwin      Created: Fri Dec 15, 2010 11:11 PM       Please call pt with results of barium  Swallow and UGI. Good news! The fundoplication is still intact and there is no reflux . The barium tablet passed without delay. Are her secretions better handled on Humibid?  She may be able to talk to her PCP concerning her secretions.

## 2010-12-18 NOTE — Telephone Encounter (Signed)
Patient notified of results. She states the secretions are not any better with the Humibid. She states during the testing she all of a sudden could not breathe. She will call her PCP for this.

## 2010-12-18 NOTE — Telephone Encounter (Signed)
Left a message for patient to call me. 

## 2011-01-12 ENCOUNTER — Telehealth: Payer: Self-pay

## 2011-01-12 NOTE — Telephone Encounter (Signed)
Pt called requesting referral for a stress test, pt did not specify reason. Please call.

## 2011-01-16 NOTE — Telephone Encounter (Signed)
Need more information. An OV to discuss her cardiac concerns will be most appropriate

## 2011-01-17 ENCOUNTER — Other Ambulatory Visit: Payer: Self-pay | Admitting: Internal Medicine

## 2011-01-17 ENCOUNTER — Ambulatory Visit: Admission: RE | Admit: 2011-01-17 | Payer: Medicare PPO | Source: Ambulatory Visit

## 2011-01-17 DIAGNOSIS — N644 Mastodynia: Secondary | ICD-10-CM

## 2011-01-17 NOTE — Telephone Encounter (Signed)
Pt sch for an office visit to discuss ref for stress test

## 2011-01-18 ENCOUNTER — Ambulatory Visit: Payer: Medicare PPO

## 2011-01-19 ENCOUNTER — Ambulatory Visit: Payer: Medicare PPO | Admitting: Internal Medicine

## 2011-01-23 ENCOUNTER — Ambulatory Visit (INDEPENDENT_AMBULATORY_CARE_PROVIDER_SITE_OTHER): Payer: Medicare PPO | Admitting: Internal Medicine

## 2011-01-23 ENCOUNTER — Encounter: Payer: Self-pay | Admitting: Internal Medicine

## 2011-01-23 DIAGNOSIS — I10 Essential (primary) hypertension: Secondary | ICD-10-CM

## 2011-01-23 DIAGNOSIS — E785 Hyperlipidemia, unspecified: Secondary | ICD-10-CM

## 2011-01-23 DIAGNOSIS — R1310 Dysphagia, unspecified: Secondary | ICD-10-CM

## 2011-01-23 DIAGNOSIS — E119 Type 2 diabetes mellitus without complications: Secondary | ICD-10-CM

## 2011-01-23 DIAGNOSIS — E039 Hypothyroidism, unspecified: Secondary | ICD-10-CM

## 2011-01-23 DIAGNOSIS — G4733 Obstructive sleep apnea (adult) (pediatric): Secondary | ICD-10-CM

## 2011-01-23 DIAGNOSIS — F411 Generalized anxiety disorder: Secondary | ICD-10-CM

## 2011-01-23 NOTE — Patient Instructions (Signed)
Lump in the throat - two esophagrams: April '11 after nissan; Sept '12 - both normal. This feeling may be allergic with post-nasal drainage. Plan - non-sedating antihistamine like loratadine as well as mucinex.  Sleep - a learned behavior. Need to follow the rules of sleep hygiene: regular hours, no stimulants, regular exercise, sleep sanctuary and no extinction behaviors.   Sleep apnea - time for a revisit with Dr. Shelle Iron.    Shortness of breath - a problem that Dr. Shelle Iron can address. You may need pulmonary function studies to better understand your breathing.  You will need a lipid panel in December

## 2011-01-25 NOTE — Assessment & Plan Note (Signed)
Lab Results  Component Value Date   HGBA1C 6.2 10/24/2010   Good control

## 2011-01-25 NOTE — Assessment & Plan Note (Signed)
Chronic sensation of lump in the throat that interferes with swallow, but no regurgitation. Has had full eval.  Plan - treatment of allergic rhinitis with post-nasal drainage - non-sedating antihistamine.

## 2011-01-25 NOTE — Assessment & Plan Note (Signed)
Continues to use CPAP. She is due for a routine follow up appointment with Dr. Shelle Iron who can also address her SOB/DOE. She may be a candidate for PFTs

## 2011-01-25 NOTE — Assessment & Plan Note (Signed)
Reltaively stable on prozac and alprazolam prn.  Plan - continue present regimen

## 2011-01-25 NOTE — Progress Notes (Signed)
  Subjective:    Patient ID: [name], female    DOB: 01/21/1941, 70 y.o.   MRN: [id number]  HPI Ms. Burack presents for evaluation after requesting cardiac stress testing. She is not having any cardiac symptoms: no angina, decreased exercise tolerance. She does have mild SOB, a sensation of a lump in the throat, occasional trouble breathing. She does have multiple chronic medical ailments which have been well controlled.  I have reviewed the patient's medical history in detail and updated the computerized patient record.    Review of Systems Constitutional:  Negative for fever, chills, activity change and unexpected weight change.  HEENT:  Negative for hearing loss, ear pain, congestion, neck stiffness and postnasal drip. Negative for sore throat or swallowing problems. Negative for dental complaints.   Eyes: Negative for vision loss or change in visual acuity.  Respiratory: Negative for chest tightness and wheezing. Positive for DOE.   Cardiovascular: Negative for chest pain or palpitations.  decreased exercise tolerance that is chronic Gastrointestinal: No change in bowel habit. No bloating or gas. No reflux or indigestion Genitourinary: Negative for urgency, frequency, flank pain and difficulty urinating.  Musculoskeletal: Negative for myalgias, back pain, arthralgias and gait problem.  Neurological: Negative for dizziness, tremors, weakness and headaches.  Hematological: Negative for adenopathy.  Psychiatric/Behavioral: Negative for behavioral problems and dysphoric mood.       Objective:   Physical Exam Vitals reviewed - good BP control HEENT- C&S clear, PERRLA Neck- supple Pulm - normal respirations w/o wheezing Cor - 2+ radial pulse, RRR       Assessment & Plan:

## 2011-01-25 NOTE — Assessment & Plan Note (Signed)
BP Readings from Last 3 Encounters:  01/23/11 112/70  12/04/10 136/74  10/24/10 118/76   Good control.

## 2011-02-06 ENCOUNTER — Ambulatory Visit
Admission: RE | Admit: 2011-02-06 | Discharge: 2011-02-06 | Disposition: A | Discharge status: Home / Self Care | Payer: Medicare PPO | Source: Ambulatory Visit | Attending: Internal Medicine | Admitting: Internal Medicine

## 2011-02-06 DIAGNOSIS — N644 Mastodynia: Secondary | ICD-10-CM

## 2011-03-19 ENCOUNTER — Other Ambulatory Visit (INDEPENDENT_AMBULATORY_CARE_PROVIDER_SITE_OTHER): Payer: Medicare PPO

## 2011-03-19 DIAGNOSIS — E785 Hyperlipidemia, unspecified: Secondary | ICD-10-CM

## 2011-03-19 DIAGNOSIS — E119 Type 2 diabetes mellitus without complications: Secondary | ICD-10-CM

## 2011-03-19 DIAGNOSIS — E039 Hypothyroidism, unspecified: Secondary | ICD-10-CM

## 2011-03-19 LAB — COMPREHENSIVE METABOLIC PANEL
ALT: 18 U/L (ref 0–35)
Albumin: 4.1 g/dL (ref 3.5–5.2)
Alkaline Phosphatase: 63 U/L (ref 39–117)
Glucose, Bld: 144 mg/dL — ABNORMAL HIGH (ref 70–99)
Potassium: 3.3 mEq/L — ABNORMAL LOW (ref 3.5–5.1)
Sodium: 137 mEq/L (ref 135–145)
Total Protein: 7.2 g/dL (ref 6.0–8.3)

## 2011-03-19 LAB — LIPID PANEL
Total CHOL/HDL Ratio: 3
VLDL: 22.8 mg/dL (ref 0.0–40.0)

## 2011-03-19 LAB — HEMOGLOBIN A1C: Hgb A1c MFr Bld: 6.3 % (ref 4.6–6.5)

## 2011-03-23 ENCOUNTER — Telehealth: Payer: Self-pay

## 2011-03-23 NOTE — Telephone Encounter (Signed)
Pt advised, copy mailed per pt request

## 2011-03-23 NOTE — Telephone Encounter (Signed)
Pt called requesting results of labs done 03/19/2011.

## 2011-03-23 NOTE — Telephone Encounter (Signed)
Lab results (will not send letter since given verbal report) Cmet normal except glucose 144. A1C 6.3% - great!Marland Kitchen LDL cholesterol 114 - goal is 100 or less. HDL 63 - great. Thyroid function normal - TSH 4.7

## 2011-03-24 ENCOUNTER — Ambulatory Visit (INDEPENDENT_AMBULATORY_CARE_PROVIDER_SITE_OTHER): Payer: Medicare PPO | Admitting: Internal Medicine

## 2011-03-24 VITALS — BP 110/70 | HR 66 | Temp 98.1°F

## 2011-03-24 DIAGNOSIS — J069 Acute upper respiratory infection, unspecified: Secondary | ICD-10-CM

## 2011-03-24 MED ORDER — BENZONATATE 100 MG PO CAPS: 100.0000 mg | ORAL_CAPSULE | Freq: Three times a day (TID) | ORAL | Status: DC | PRN

## 2011-03-24 MED ORDER — AZITHROMYCIN 250 MG PO TABS: ORAL_TABLET | ORAL | Status: AC

## 2011-03-24 NOTE — Patient Instructions (Signed)
Sinus infection - plan: Z-pak as directed; renewal of tessalon perles; sudafed 30 mg twice a day as needed for pressure and headache; tylenol as needed; stove top vaporizer for comfort; nasal saline wash.  Sinusitis Sinuses are air pockets within the bones of your face. The growth of bacteria within a sinus leads to infection. The infection prevents the sinuses from draining. This infection is called sinusitis. SYMPTOMS   There will be different areas of pain depending on which sinuses have become infected.  The maxillary sinuses often produce pain beneath the eyes.     Frontal sinusitis may cause pain in the middle of the forehead and above the eyes.  Other problems (symptoms) include:  Toothaches.     Colored, pus-like (purulent) drainage from the nose.     Swelling, warmth, and tenderness over the sinus areas may be signs of infection.  TREATMENT   Sinusitis is most often determined by an exam.X-rays may be taken. If x-rays have been taken, make sure you obtain your results or find out how you are to obtain them. Your caregiver may give you medications (antibiotics). These are medications that will help kill the bacteria causing the infection. You may also be given a medication (decongestant) that helps to reduce sinus swelling.   HOME CARE INSTRUCTIONS    Only take over-the-counter or prescription medicines for pain, discomfort, or fever as directed by your caregiver.     Drink extra fluids. Fluids help thin the mucus so your sinuses can drain more easily.     Applying either moist heat or ice packs to the sinus areas may help relieve discomfort.     Use saline nasal sprays to help moisten your sinuses. The sprays can be found at your local drugstore.  SEEK IMMEDIATE MEDICAL CARE IF:  You have a fever.     You have increasing pain, severe headaches, or toothache.     You have nausea, vomiting, or drowsiness.     You develop unusual swelling around the face or trouble seeing.    MAKE SURE YOU:    Understand these instructions.     Will watch your condition.     Will get help right away if you are not doing well or get worse.  Document Released: 03/05/2005 Document Revised: 11/15/2010 Document Reviewed: 10/02/2006 [facility] Patient Information 2012 [place], [place].

## 2011-03-24 NOTE — Progress Notes (Signed)
  Subjective:    Patient ID: [name], female    DOB: 05-05-1940, 71 y.o.   MRN: [id number]  HPI [name] reports a 10 day h/o URI-cold symptoms. She has been self-doctoring wit coracedin HC, benadryl chldrens dose. Now she has pain in the left face and HA on the left side. No fever but has had chills, has had nausea but no vomiting, no diarrhea. Mild SOB but not limiting.   I have reviewed the patient's medical history in detail and updated the computerized patient record.    Review of Systems System review is negative for any constitutional, cardiac, pulmonary, GI or neuro symptoms or complaints other than as described in the HPI.     Objective:   Physical Exam Filed Vitals:   03/24/11 1040  BP: 110/70  Pulse: 66  Temp: 98.1 F (36.7 C)   Gen'l- overwight AA woman in no acute distress HEENT- mild tenderness to percussion left maxillary sinus, Throat clear Nodes - negative Pulm - normal respirations, CTAP Cor - RRR       Assessment & Plan:  sinsuisitis Plan - z-pak, sudafed 30 mg bid, supportive care.

## 2011-04-23 ENCOUNTER — Ambulatory Visit (INDEPENDENT_AMBULATORY_CARE_PROVIDER_SITE_OTHER): Payer: Medicare PPO | Admitting: Internal Medicine

## 2011-04-23 VITALS — BP 128/68 | HR 76 | Temp 97.0°F | Ht 59.0 in

## 2011-04-23 DIAGNOSIS — K5792 Diverticulitis of intestine, part unspecified, without perforation or abscess without bleeding: Secondary | ICD-10-CM

## 2011-04-23 DIAGNOSIS — J309 Allergic rhinitis, unspecified: Secondary | ICD-10-CM

## 2011-04-23 DIAGNOSIS — K5732 Diverticulitis of large intestine without perforation or abscess without bleeding: Secondary | ICD-10-CM

## 2011-04-23 MED ORDER — GLUCOSE BLOOD VI STRP: ORAL_STRIP | Status: DC

## 2011-04-23 MED ORDER — AMOXICILLIN-POT CLAVULANATE 875-125 MG PO TABS: 1.0000 | ORAL_TABLET | Freq: Two times a day (BID) | ORAL | Status: AC

## 2011-04-23 NOTE — Patient Instructions (Signed)
Allergic rhinitis - may have an exacerbation. Plan - continue the nasal saline wash, the cold medication (check the content in regard to the amount of decongestant - should be less than 90 mg per day of pseudoephedrine) Augmentin will provide antibiotic coverage.  Breathing difficulty with standing - I don't have a good explanation for this. If it continues to be a problem we can start an investigation of pulmonary function and may need a pulmonary consult.   Diverticulitis - not a severe problem with no fever and mild-moderate tenderness. Plan - Augmentin twice a day for 10 days. Call for nausea, vomiting, inability to keep down fluids or medications.   Diverticulitis A diverticulum is a small pouch or sac on the colon. Diverticulosis is the presence of these diverticula on the colon. Diverticulitis is the irritation (inflammation) or infection of diverticula. CAUSES   The colon and its diverticula contain bacteria. If food particles block the tiny opening to a diverticulum, the bacteria inside can grow and cause an increase in pressure. This leads to infection and inflammation and is called diverticulitis. SYMPTOMS    Abdominal pain and tenderness. Usually, the pain is located on the left side of your abdomen. However, it could be located elsewhere.     Fever.    Bloating.    Feeling sick to your stomach (nausea).     Throwing up (vomiting).     Abnormal stools.  DIAGNOSIS   Your caregiver will take a history and perform a physical exam. Since many things can cause abdominal pain, other tests may be necessary. Tests may include:  Blood tests.     Urine tests.     X-ray of the abdomen.     CT scan of the abdomen.  Sometimes, surgery is needed to determine if diverticulitis or other conditions are causing your symptoms. TREATMENT   Most of the time, you can be treated without surgery. Treatment includes:  Resting the bowels by only having liquids for a few days. As you improve,  you will need to eat a low-fiber diet.     Intravenous (IV) fluids if you are losing body fluids (dehydrated).     Antibiotic medicines that treat infections may be given.     Pain and nausea medicine, if needed.     Surgery if the inflamed diverticulum has burst.  HOME CARE INSTRUCTIONS    Try a clear liquid diet (broth, tea, or water for as long as directed by your caregiver). You may then gradually begin a low-fiber diet as tolerated. A low-fiber diet is a diet with less than 10 grams of fiber. Choose the foods below to reduce fiber in the diet:     White breads, cereals, rice, and pasta.     Cooked fruits and vegetables or soft fresh fruits and vegetables without the skin.     Ground or well-cooked tender beef, ham, veal, lamb, pork, or poultry.     Eggs and seafood.     After your diverticulitis symptoms have improved, your caregiver may put you on a high-fiber diet. A high-fiber diet includes 14 grams of fiber for every 1000 calories consumed. For a standard 2000 calorie diet, you would need 28 grams of fiber. Follow these diet guidelines to help you increase the fiber in your diet. It is important to slowly increase the amount fiber in your diet to avoid gas, constipation, and bloating.     Choose whole-grain breads, cereals, pasta, and brown rice.     Choose  fresh fruits and vegetables with the skin on. Do not overcook vegetables because the more vegetables are cooked, the more fiber is lost.     Choose more nuts, seeds, legumes, dried peas, beans, and lentils.     Look for food products that have greater than 3 grams of fiber per serving on the Nutrition Facts label.     Take all medicine as directed by your caregiver.     If your caregiver has given you a follow-up appointment, it is very important that you go. Not going could result in lasting (chronic) or permanent injury, pain, and disability. If there is any problem keeping the appointment, call to reschedule.  SEEK  MEDICAL CARE IF:    Your pain does not improve.     You have a hard time advancing your diet beyond clear liquids.     Your bowel movements do not return to normal.  SEEK IMMEDIATE MEDICAL CARE IF:    Your pain becomes worse.     You have an oral temperature above 102 F (38.9 C), not controlled by medicine.     You have repeated vomiting.     You have bloody or black, tarry stools.     Symptoms that brought you to your caregiver become worse or are not getting better.  MAKE SURE YOU:    Understand these instructions.     Will watch your condition.     Will get help right away if you are not doing well or get worse.  Document Released: 12/13/2004 Document Revised: 11/15/2010 Document Reviewed: 04/10/2010 [facility] Patient Information 2012 [place], [place].

## 2011-04-23 NOTE — Progress Notes (Signed)
  Subjective:    Patient ID: [name], female    DOB: Nov 01, 1940, 71 y.o.   MRN: [id number]  HPI Ms. Bellizzi presents with a complaint of LLQ abdominal pain that is like previous episodes of diverticulitis. No fever, no diarrhea, change in bowel habit with change in caliber of stool. No blood in the stool. No N/V. Pain is rated as moderate to severe. Last colonoscopy in '08 - notable for diverticulosis.   She reports that she has episodes of dyspnea lasting 10-15 seconds when she first stands up, but not always.   She continues to have sinus pressure and discomfort. She does take a Bayer product with a decongestant. She had used nasonex in the past with some relief but she states she gets as much relief with saline.   PMH, FamHx and SocHx reviewed for any changes and relevance.    Review of Systems System review is negative for any constitutional, cardiac, pulmonary, GI or neuro symptoms or complaints other than as described in the HPI.     Objective:   Physical Exam Filed Vitals:   04/23/11 1605  BP: 128/68  Pulse: 76  Temp: 97 F (36.1 C)   Gen'l- overwieght AA woman in no acute distress HEENT- tender to percussion over the left maxillary sinus, to a lesser extent the left frontal sinus Chest - good breath sounds w/o wheezes or rales Cor - RRR Abdomen - BS+ except for diminished sounds LLQ. She has tenderness on the left side from the level of the umbilicus to the LLQ. No guarding or rebound. Neuro- A&O x 3, CN II-XII normal.       Assessment & Plan:  Diverticulitis - patient with known diverticulosis by colonoscopy. Now with LLQ abdominal pain but no guarding or rebound or fever. Plan - Augmentin bid x 10 days.

## 2011-04-23 NOTE — Assessment & Plan Note (Signed)
Persistent chronic systems with an exacerbation. Plan - augmentin  Bid. Continue nasal saline.

## 2011-05-01 ENCOUNTER — Telehealth: Payer: Self-pay

## 2011-05-01 DIAGNOSIS — R197 Diarrhea, unspecified: Secondary | ICD-10-CM

## 2011-05-01 NOTE — Telephone Encounter (Signed)
Pt called stating that she has been experiencing diarrhea since starting Amoxicillin. She has 2 days remaining, today and tomorrow and is willing to completed course with MD's advise.

## 2011-05-01 NOTE — Telephone Encounter (Signed)
May use immodium for diarrhea.

## 2011-05-03 NOTE — Telephone Encounter (Signed)
Pt called again stating that she is still experiencing diarrhea, not as bad as before. Pt is requesting MD's advisement on started a probiotic to help with sxs, please advise.

## 2011-05-03 NOTE — Telephone Encounter (Signed)
Ok for probiotics. Needs lab: stool for c.diff order entered

## 2011-05-04 NOTE — Telephone Encounter (Signed)
Pt advised and will come in for lab

## 2011-05-11 ENCOUNTER — Other Ambulatory Visit: Payer: Self-pay | Admitting: *Deleted

## 2011-05-11 MED ORDER — ONETOUCH LANCETS MISC: Status: DC

## 2011-05-25 ENCOUNTER — Other Ambulatory Visit: Payer: Self-pay | Admitting: *Deleted

## 2011-05-25 MED ORDER — ONETOUCH LANCETS MISC: Status: DC

## 2011-05-25 NOTE — Telephone Encounter (Signed)
Done

## 2011-06-22 ENCOUNTER — Other Ambulatory Visit: Payer: Self-pay

## 2011-06-22 MED ORDER — DILTIAZEM HCL ER COATED BEADS 180 MG PO CP24: 180.0000 mg | ORAL_CAPSULE | Freq: Every evening | ORAL | Status: DC

## 2011-06-22 MED ORDER — LOSARTAN POTASSIUM 100 MG PO TABS: 100.0000 mg | ORAL_TABLET | Freq: Every day | ORAL | Status: DC

## 2011-06-22 MED ORDER — POTASSIUM CHLORIDE CRYS ER 20 MEQ PO TBCR: 20.0000 meq | EXTENDED_RELEASE_TABLET | Freq: Every day | ORAL | Status: DC

## 2011-06-22 MED ORDER — FLUOXETINE HCL 20 MG PO CAPS: 20.0000 mg | ORAL_CAPSULE | Freq: Every day | ORAL | Status: DC

## 2011-06-22 MED ORDER — LEVOTHYROXINE SODIUM 75 MCG PO TABS: 75.0000 ug | ORAL_TABLET | Freq: Every day | ORAL | Status: DC

## 2011-06-28 ENCOUNTER — Encounter: Payer: Self-pay | Admitting: Internal Medicine

## 2011-06-28 ENCOUNTER — Other Ambulatory Visit (INDEPENDENT_AMBULATORY_CARE_PROVIDER_SITE_OTHER): Payer: Medicare PPO

## 2011-06-28 ENCOUNTER — Ambulatory Visit (INDEPENDENT_AMBULATORY_CARE_PROVIDER_SITE_OTHER): Payer: Medicare PPO | Admitting: Internal Medicine

## 2011-06-28 VITALS — BP 112/76 | HR 82 | Temp 98.3°F | Resp 16 | Wt 179.0 lb

## 2011-06-28 DIAGNOSIS — E1142 Type 2 diabetes mellitus with diabetic polyneuropathy: Secondary | ICD-10-CM

## 2011-06-28 DIAGNOSIS — F411 Generalized anxiety disorder: Secondary | ICD-10-CM

## 2011-06-28 DIAGNOSIS — E1149 Type 2 diabetes mellitus with other diabetic neurological complication: Secondary | ICD-10-CM

## 2011-06-28 DIAGNOSIS — D649 Anemia, unspecified: Secondary | ICD-10-CM

## 2011-06-28 DIAGNOSIS — J019 Acute sinusitis, unspecified: Secondary | ICD-10-CM

## 2011-06-28 DIAGNOSIS — E039 Hypothyroidism, unspecified: Secondary | ICD-10-CM

## 2011-06-28 DIAGNOSIS — I10 Essential (primary) hypertension: Secondary | ICD-10-CM

## 2011-06-28 LAB — LIPID PANEL
Cholesterol: 196 mg/dL (ref 0–200)
HDL: 62.3 mg/dL (ref >39.00)
Total CHOL/HDL Ratio: 3
Triglycerides: 108 mg/dL (ref 0.0–149.0)

## 2011-06-28 LAB — CBC WITH DIFFERENTIAL/PLATELET
Basophils Absolute: 0 10*3/uL (ref 0.0–0.1)
Basophils Relative: 0.6 % (ref 0.0–3.0)
HCT: 36.7 % (ref 36.0–46.0)
Hemoglobin: 11.9 g/dL — ABNORMAL LOW (ref 12.0–15.0)
Lymphs Abs: 1.9 10*3/uL (ref 0.7–4.0)
MCHC: 32.4 g/dL (ref 30.0–36.0)
Monocytes Relative: 9.7 % (ref 3.0–12.0)
Neutro Abs: 5.3 10*3/uL (ref 1.4–7.7)
RBC: 4.01 Mil/uL (ref 3.87–5.11)
RDW: 14.9 % — ABNORMAL HIGH (ref 11.5–14.6)

## 2011-06-28 LAB — COMPREHENSIVE METABOLIC PANEL
ALT: 18 U/L (ref 0–35)
BUN: 19 mg/dL (ref 6–23)
CO2: 28 mEq/L (ref 19–32)
Calcium: 9.3 mg/dL (ref 8.4–10.5)
Creatinine, Ser: 0.8 mg/dL (ref 0.4–1.2)
GFR: 88.11 mL/min (ref >60.00)
Total Bilirubin: 0.1 mg/dL — ABNORMAL LOW (ref 0.3–1.2)

## 2011-06-28 LAB — TSH: TSH: 2.9 u[IU]/mL (ref 0.35–5.50)

## 2011-06-28 MED ORDER — GLUCOSE BLOOD VI STRP: ORAL_STRIP | Status: DC

## 2011-06-28 MED ORDER — ALPRAZOLAM 0.25 MG PO TABS: 0.2500 mg | ORAL_TABLET | Freq: Four times a day (QID) | ORAL | Status: DC | PRN

## 2011-06-28 MED ORDER — BENZONATATE 100 MG PO CAPS: 100.0000 mg | ORAL_CAPSULE | Freq: Three times a day (TID) | ORAL | Status: DC | PRN

## 2011-06-28 MED ORDER — AZITHROMYCIN 500 MG PO TABS: 500.0000 mg | ORAL_TABLET | Freq: Every day | ORAL | Status: AC

## 2011-06-28 NOTE — Assessment & Plan Note (Signed)
I will check her TSH today 

## 2011-06-28 NOTE — Assessment & Plan Note (Signed)
Xanax refill today

## 2011-06-28 NOTE — Progress Notes (Signed)
Subjective:    Patient ID: [name], female    DOB: April 09, 1940, 71 y.o.   MRN: [id number]  Sinusitis This is a recurrent problem. The current episode started 1 to 4 weeks ago. The problem has been gradually worsening since onset. There has been no fever. Her pain is at a severity of 0/10. She is experiencing no pain. Associated symptoms include chills, congestion, coughing, sinus pressure and sneezing. Pertinent negatives include no diaphoresis, ear pain, headaches, hoarse voice, neck pain, shortness of breath, sore throat or swollen glands. Past treatments include nothing.  Diabetes She presents for her follow-up diabetic visit. She has type 2 diabetes mellitus. Her disease course has been stable. Hypoglycemia symptoms include nervousness/anxiousness. Pertinent negatives for hypoglycemia include no confusion, dizziness, headaches, pallor, seizures, speech difficulty or tremors. Associated symptoms include fatigue. Pertinent negatives for diabetes include no chest pain and no weakness. There are no hypoglycemic complications. There are no diabetic complications. Current diabetic treatment includes diet. She is compliant with treatment some of the time. Her weight is increasing steadily. She is following a generally healthy diet. Meal planning includes avoidance of concentrated sweets. She has had a previous visit with a dietician. She participates in exercise intermittently. There is no change in her home blood glucose trend. An ACE inhibitor/angiotensin II receptor blocker is not being taken. She does not see a podiatrist.Eye exam is current.      Review of Systems  Constitutional: Positive for chills and fatigue. Negative for fever, diaphoresis, activity change, appetite change and unexpected weight change.  HENT: Positive for congestion, rhinorrhea, sneezing and sinus pressure. Negative for hearing loss, ear pain, nosebleeds, sore throat, hoarse voice, facial swelling, drooling, mouth sores,  trouble swallowing, neck pain, neck stiffness, dental problem, voice change and tinnitus.   Eyes: Negative.   Respiratory: Positive for cough. Negative for apnea, choking, chest tightness, shortness of breath, wheezing and stridor.   Cardiovascular: Negative for chest pain, palpitations and leg swelling.  Gastrointestinal: Negative for nausea, vomiting, abdominal pain, diarrhea, constipation and anal bleeding.  Genitourinary: Negative.   Musculoskeletal: Negative.   Skin: Negative for color change, pallor, rash and wound.  Neurological: Negative for dizziness, tremors, seizures, syncope, facial asymmetry, speech difficulty, weakness, light-headedness, numbness and headaches.  Hematological: Negative for adenopathy. Does not bruise/bleed easily.  Psychiatric/Behavioral: Negative for suicidal ideas, hallucinations, behavioral problems, confusion, sleep disturbance, self-injury, dysphoric mood, decreased concentration and agitation. The patient is nervous/anxious. The patient is not hyperactive.        Objective:   Physical Exam  Vitals reviewed. Constitutional: She is oriented to person, place, and time. She appears well-developed and well-nourished. No distress.  HENT:  Head: Normocephalic and atraumatic. No trismus in the jaw.  Right Ear: Hearing, tympanic membrane, external ear and ear canal normal.  Left Ear: Hearing, external ear and ear canal normal.  Nose: Mucosal edema and rhinorrhea present. Right sinus exhibits no maxillary sinus tenderness and no frontal sinus tenderness. Left sinus exhibits maxillary sinus tenderness. Left sinus exhibits no frontal sinus tenderness.  Mouth/Throat: Mucous membranes are normal. Mucous membranes are not pale, not dry and not cyanotic. No uvula swelling. No oropharyngeal exudate, posterior oropharyngeal edema, posterior oropharyngeal erythema or tonsillar abscesses.  Eyes: Conjunctivae are normal. Right eye exhibits no discharge. Left eye exhibits no  discharge. No scleral icterus.  Neck: Normal range of motion. Neck supple. No JVD present. No tracheal deviation present. No thyromegaly present.  Cardiovascular: Normal rate, regular rhythm, normal heart sounds and intact distal pulses.  Exam reveals no gallop and no friction rub.   No murmur heard. Pulmonary/Chest: Effort normal and breath sounds normal. No stridor. No respiratory distress. She has no wheezes. She has no rales. She exhibits no tenderness.  Abdominal: Soft. Bowel sounds are normal. She exhibits no distension. There is no tenderness. There is no rebound and no guarding.  Musculoskeletal: Normal range of motion. She exhibits no edema and no tenderness.  Lymphadenopathy:    She has no cervical adenopathy.  Neurological: She is oriented to person, place, and time.  Skin: Skin is warm and dry. No rash noted. She is not diaphoretic. No erythema. No pallor.  Psychiatric: She has a normal mood and affect. Her behavior is normal. Judgment and thought content normal.     Lab Results  Component Value Date   WBC 7.7 10/24/2010   HGB 11.8* 10/24/2010   HCT 35.5* 10/24/2010   PLT 351.0 10/24/2010   GLUCOSE 144* 03/19/2011   CHOL 200 03/19/2011   TRIG 114.0 03/19/2011   HDL 63.70 03/19/2011   LDLDIRECT 172.7 10/24/2010   LDLCALC 114* 03/19/2011   ALT 18 03/19/2011   AST 21 03/19/2011   NA 137 03/19/2011   K 3.3* 03/19/2011   CL 101 03/19/2011   CREATININE 0.7 03/19/2011   BUN 21 03/19/2011   CO2 28 03/19/2011   TSH 4.73 03/19/2011   HGBA1C 6.3 03/19/2011       Assessment & Plan:

## 2011-06-28 NOTE — Assessment & Plan Note (Signed)
Her BP is well controlled, I will check her lytes and renal function 

## 2011-06-28 NOTE — Patient Instructions (Signed)
Diabetes, Type 2 Diabetes is a long-lasting (chronic) disease. In type 2 diabetes, the pancreas does not make enough insulin (a hormone), and the body does not respond normally to the insulin that is made. This type of diabetes was also previously called adult-onset diabetes. It usually occurs after the age of 40, but it can occur at any age.  CAUSES  Type 2 diabetes happens because the pancreasis not making enough insulin or your body has trouble using the insulin that your pancreas does make properly. SYMPTOMS   Drinking more than usual.   Urinating more than usual.   Blurred vision.   Dry, itchy skin.   Frequent infections.   Feeling more tired than usual (fatigue).  DIAGNOSIS The diagnosis of type 2 diabetes is usually made by one of the following tests:  Fasting blood glucose test. You will not eat for at least 8 hours and then take a blood test.   Random blood glucose test. Your blood glucose (sugar) is checked at any time of the day regardless of when you ate.   Oral glucose tolerance test (OGTT). Your blood glucose is measured after you have not eaten (fasted) and then after you drink a glucose containing beverage.  TREATMENT   Healthy eating.   Exercise.   Medicine, if needed.   Monitoring blood glucose.   Seeing your caregiver regularly.  HOME CARE INSTRUCTIONS   Check your blood glucose at least once a day. More frequent monitoring may be necessary, depending on your medicines and on how well your diabetes is controlled. Your caregiver will advise you.   Take your medicine as directed by your caregiver.   Do not smoke.   Make wise food choices. Ask your caregiver for information. Weight loss can improve your diabetes.   Learn about low blood glucose (hypoglycemia) and how to treat it.   Get your eyes checked regularly.   Have a yearly physical exam. Have your blood pressure checked and your blood and urine tested.   Wear a pendant or bracelet saying  that you have diabetes.   Check your feet every night for cuts, sores, blisters, and redness. Let your caregiver know if you have any problems.  SEEK MEDICAL CARE IF:   You have problems keeping your blood glucose in target range.   You have problems with your medicines.   You have symptoms of an illness that do not improve after 24 hours.   You have a sore or wound that is not healing.   You notice a change in vision or a new problem with your vision.   You have a fever.  MAKE SURE YOU:  Understand these instructions.   Will watch your condition.   Will get help right away if you are not doing well or get worse.  Document Released: 03/05/2005 Document Revised: 02/22/2011 Document Reviewed: 08/21/2010 [employer] Patient Information 2012 [employer].Sinusitis Sinuses are air pockets within the bones of your face. The growth of bacteria within a sinus leads to infection. The infection prevents the sinuses from draining. This infection is called sinusitis. SYMPTOMS  There will be different areas of pain depending on which sinuses have become infected.  The maxillary sinuses often produce pain beneath the eyes.   Frontal sinusitis may cause pain in the middle of the forehead and above the eyes.  Other problems (symptoms) include:  Toothaches.   Colored, pus-like (purulent) drainage from the nose.   Swelling, warmth, and tenderness over the sinus areas may be signs   of infection.  TREATMENT  Sinusitis is most often determined by an exam.X-rays may be taken. If x-rays have been taken, make sure you obtain your results or find out how you are to obtain them. Your caregiver may give you medications (antibiotics). These are medications that will help kill the bacteria causing the infection. You may also be given a medication (decongestant) that helps to reduce sinus swelling.  HOME CARE INSTRUCTIONS   Only take over-the-counter or prescription medicines for pain, discomfort, or  fever as directed by your caregiver.   Drink extra fluids. Fluids help thin the mucus so your sinuses can drain more easily.   Applying either moist heat or ice packs to the sinus areas may help relieve discomfort.   Use saline nasal sprays to help moisten your sinuses. The sprays can be found at your local drugstore.  SEEK IMMEDIATE MEDICAL CARE IF:  You have a fever.   You have increasing pain, severe headaches, or toothache.   You have nausea, vomiting, or drowsiness.   You develop unusual swelling around the face or trouble seeing.  MAKE SURE YOU:   Understand these instructions.   Will watch your condition.   Will get help right away if you are not doing well or get worse.  Document Released: 03/05/2005 Document Revised: 02/22/2011 Document Reviewed: 10/02/2006 [employer] Patient Information 2012 [employer]. 

## 2011-06-28 NOTE — Assessment & Plan Note (Signed)
Start zpak for the infection 

## 2011-06-28 NOTE — Assessment & Plan Note (Signed)
I will recheck her a1c today 

## 2011-06-29 ENCOUNTER — Other Ambulatory Visit: Payer: Self-pay

## 2011-06-29 ENCOUNTER — Encounter: Payer: Self-pay | Admitting: Internal Medicine

## 2011-06-29 LAB — HEMOGLOBIN A1C: Hgb A1c MFr Bld: 6.6 % — ABNORMAL HIGH (ref 4.6–6.5)

## 2011-06-29 MED ORDER — ONETOUCH LANCETS MISC: Status: DC

## 2011-07-20 ENCOUNTER — Encounter: Payer: Self-pay | Admitting: Internal Medicine

## 2011-07-20 ENCOUNTER — Ambulatory Visit (INDEPENDENT_AMBULATORY_CARE_PROVIDER_SITE_OTHER): Payer: Medicare PPO | Admitting: Internal Medicine

## 2011-07-20 VITALS — BP 108/70 | HR 76 | Temp 98.4°F | Resp 16 | Ht 60.0 in | Wt 177.0 lb

## 2011-07-20 DIAGNOSIS — R0602 Shortness of breath: Secondary | ICD-10-CM | POA: Insufficient documentation

## 2011-07-20 DIAGNOSIS — J309 Allergic rhinitis, unspecified: Secondary | ICD-10-CM

## 2011-07-20 MED ORDER — METHYLPREDNISOLONE ACETATE 80 MG/ML IJ SUSP
120.0000 mg | Freq: Once | INTRAMUSCULAR | Status: AC
  Administered 2011-07-20: 120 mg via INTRAMUSCULAR

## 2011-07-22 ENCOUNTER — Encounter: Payer: Self-pay | Admitting: Internal Medicine

## 2011-07-22 NOTE — Assessment & Plan Note (Signed)
She was given an injection of depo-medrol IM for additional symptom relief

## 2011-07-22 NOTE — Progress Notes (Signed)
Subjective:    Patient ID: [name], female    DOB: May 30, 1940, 71 y.o.   MRN: [id number]  Sinusitis This is a chronic problem. The current episode started more than 1 year ago. The problem has been gradually worsening since onset. There has been no fever. The fever has been present for less than 1 day. Her pain is at a severity of 0/10. She is experiencing no pain. Associated symptoms include congestion, shortness of breath and sneezing. Pertinent negatives include no chills, coughing, diaphoresis, ear pain, headaches, hoarse voice, neck pain, sinus pressure, sore throat or swollen glands. Past treatments include spray decongestants (zyrtec,benadryl,antibiotics, mometasone NS).  Shortness of Breath This is a recurrent problem. The current episode started more than 1 month ago. The problem occurs intermittently. The problem has been unchanged. Associated symptoms include rhinorrhea. Pertinent negatives include no abdominal pain, chest pain, claudication, coryza, ear pain, fever, headaches, hemoptysis, leg pain, leg swelling, neck pain, orthopnea, PND, rash, sore throat, sputum production, swollen glands, syncope, vomiting or wheezing. The symptoms are aggravated by nothing. She has tried nothing for the symptoms. Her past medical history is significant for allergies.      Review of Systems  Constitutional: Negative for fever, chills, diaphoresis, activity change, appetite change, fatigue and unexpected weight change.  HENT: Positive for congestion, rhinorrhea, sneezing and postnasal drip. Negative for hearing loss, ear pain, nosebleeds, sore throat, hoarse voice, facial swelling, drooling, mouth sores, trouble swallowing, neck pain, neck stiffness, dental problem, voice change, sinus pressure, tinnitus and ear discharge.   Eyes: Negative.   Respiratory: Positive for shortness of breath. Negative for apnea, cough, hemoptysis, sputum production, choking, chest tightness, wheezing and stridor.     Cardiovascular: Negative for chest pain, palpitations, orthopnea, claudication, leg swelling, syncope and PND.  Gastrointestinal: Negative for nausea, vomiting, abdominal pain, diarrhea, constipation, blood in stool, abdominal distention, anal bleeding and rectal pain.  Genitourinary: Negative.   Musculoskeletal: Negative.   Skin: Negative for color change, pallor, rash and wound.  Neurological: Negative for dizziness, tremors, seizures, syncope, facial asymmetry, speech difficulty, weakness, light-headedness, numbness and headaches.  Hematological: Negative for adenopathy. Does not bruise/bleed easily.  Psychiatric/Behavioral: Negative.        Objective:   Physical Exam  Vitals reviewed. Constitutional: She is oriented to person, place, and time. She appears well-developed and well-nourished. No distress.  HENT:  Head: Normocephalic and atraumatic. No trismus in the jaw.  Right Ear: Hearing, tympanic membrane, external ear and ear canal normal.  Left Ear: Hearing, tympanic membrane, external ear and ear canal normal.  Nose: Mucosal edema present. No rhinorrhea, nose lacerations, sinus tenderness, nasal deformity, septal deviation or nasal septal hematoma. No epistaxis.  No foreign bodies. Right sinus exhibits no maxillary sinus tenderness and no frontal sinus tenderness. Left sinus exhibits no maxillary sinus tenderness and no frontal sinus tenderness.  Mouth/Throat: Oropharynx is clear and moist and mucous membranes are normal. Mucous membranes are not pale, not dry and not cyanotic. No uvula swelling. No oropharyngeal exudate, posterior oropharyngeal edema, posterior oropharyngeal erythema or tonsillar abscesses.  Eyes: Conjunctivae are normal. Right eye exhibits no discharge. Left eye exhibits no discharge. No scleral icterus.  Neck: Normal range of motion. Neck supple. No JVD present. No tracheal deviation present. No thyromegaly present.  Cardiovascular: Normal rate, regular rhythm,  normal heart sounds and intact distal pulses.  Exam reveals no gallop and no friction rub.   No murmur heard. Pulmonary/Chest: Effort normal and breath sounds normal. No stridor. No respiratory distress.  She has no wheezes. She has no rales. She exhibits no tenderness.  Abdominal: Soft. Bowel sounds are normal. She exhibits no distension and no mass. There is no tenderness. There is no rebound and no guarding.  Musculoskeletal: Normal range of motion. She exhibits no edema and no tenderness.  Lymphadenopathy:    She has no cervical adenopathy.  Neurological: She is oriented to person, place, and time.  Skin: Skin is warm and dry. No rash noted. She is not diaphoretic. No erythema. No pallor.  Psychiatric: She has a normal mood and affect. Her behavior is normal. Judgment and thought content normal.     Lab Results  Component Value Date   WBC 8.3 06/28/2011   HGB 11.9* 06/28/2011   HCT 36.7 06/28/2011   PLT 354.0 06/28/2011   GLUCOSE 99 06/28/2011   CHOL 196 06/28/2011   TRIG 108.0 06/28/2011   HDL 62.30 06/28/2011   LDLDIRECT 172.7 10/24/2010   LDLCALC 112* 06/28/2011   ALT 18 06/28/2011   AST 23 06/28/2011   NA 139 06/28/2011   K 3.7 06/28/2011   CL 102 06/28/2011   CREATININE 0.8 06/28/2011   BUN 19 06/28/2011   CO2 28 06/28/2011   TSH 2.90 06/28/2011   HGBA1C 6.6* 06/28/2011       Assessment & Plan:

## 2011-07-22 NOTE — Assessment & Plan Note (Signed)
Her EKG shows low voltage c/w her body habitus but is non-diagnostic, I have asked her to see cardiology about further testing such as an ECHO

## 2011-07-26 ENCOUNTER — Ambulatory Visit (INDEPENDENT_AMBULATORY_CARE_PROVIDER_SITE_OTHER)
Admission: RE | Admit: 2011-07-26 | Discharge: 2011-07-26 | Disposition: A | Discharge status: Home / Self Care | Payer: Medicare PPO | Source: Ambulatory Visit | Attending: Endocrinology | Admitting: Endocrinology

## 2011-07-26 ENCOUNTER — Encounter: Payer: Self-pay | Admitting: Endocrinology

## 2011-07-26 ENCOUNTER — Ambulatory Visit (INDEPENDENT_AMBULATORY_CARE_PROVIDER_SITE_OTHER): Payer: Medicare PPO | Admitting: Endocrinology

## 2011-07-26 VITALS — BP 110/68 | HR 69 | Temp 98.2°F

## 2011-07-26 DIAGNOSIS — R059 Cough, unspecified: Secondary | ICD-10-CM

## 2011-07-26 DIAGNOSIS — R05 Cough: Secondary | ICD-10-CM

## 2011-07-26 MED ORDER — DOXYCYCLINE HYCLATE 100 MG PO TABS: 100.0000 mg | ORAL_TABLET | Freq: Two times a day (BID) | ORAL | Status: AC

## 2011-07-26 MED ORDER — DOXYCYCLINE HYCLATE 100 MG PO TABS: 100.0000 mg | ORAL_TABLET | Freq: Two times a day (BID) | ORAL | Status: DC

## 2011-07-26 NOTE — Progress Notes (Signed)
Subjective:    Patient ID: [name], female    DOB: 1940-12-12, 71 y.o.   MRN: [id number]  HPI Pt states 10 days of moderate prod-quality cough in the chest, and assoc headache. Past Medical History  Diagnosis Date  . Hypertension   . Hypothyroidism   . Sleep apnea     Dr Shelle Iron CPAP 2009  . Depression   . Diabetes mellitus type II     diet  . Allergic rhinitis   . Anxiety   . Chronic cough     Dr Sherene Sires, onset 11/2007, Sinus CT July 7,2010>neg, Resolved 10/2008 on ppi two times a day  . Obesity (BMI 30-39.9)   . Pneumonia   . Hiatal hernia   . GERD (gastroesophageal reflux disease)   . Dyslipidemia   . Lung nodule   . Diverticulosis   . Colon polyp   . Arthritis     Past Surgical History  Procedure Date  . Left breast lumpectomy   . Tonsillectomy     x 2  . Cholecystectomy     1998 in [place]  . Rectocele repair   . Bladder suspension   . Ventral hernia repair   . Appendectomy   . Abdominal hysterectomy   . Nissen fundoplication     March '11 Dr Michaell Cowing    History   Social History  . Marital Status: Single    Spouse Name: N/A    Number of Children: N/A  . Years of Education: N/A   Occupational History  . reited Engineer, petroleum    Social History Main Topics  . Smoking status: Former Smoker -- 1.5 packs/day for 25 years    Types: Cigarettes    Quit date: 10/28/1985  . Smokeless tobacco: Never Used  . Alcohol Use: No  . Drug Use: No  . Sexually Active: Not Currently   Other Topics Concern  . Not on file   Social History Narrative   HSG. Married -divorced. 2 sons. 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 Patient does not get regular exercise. Loves to read. Strong faith.     Current Outpatient Prescriptions on File Prior to Visit  Medication Sig Dispense Refill  . ALPRAZolam (XANAX) 0.25 MG tablet Take 1 tablet (0.25 mg total) by mouth every 6 (six) hours as needed for anxiety.  90 tablet  2  . AMBULATORY NON FORMULARY MEDICATION  Medication Name: Humabid 600 mg. Take 1 tablet by mouth twice daily.  60 tablet  1  . benzonatate (TESSALON PERLES) 100 MG capsule Take 1 capsule (100 mg total) by mouth 3 (three) times daily as needed for cough.  50 capsule  1  . Calcium Carbonate-Vitamin D (CALCIUM 600+D) 600-200 MG-UNIT TABS Take by mouth daily.        . chlorpheniramine (CHLOR-TRIMETON) 4 MG tablet Take 4 mg by mouth every 6 (six) hours as needed.        . Cinnamon 500 MG TABS Take by mouth daily.        Marland Kitchen dextromethorphan-guaiFENesin (MUCINEX DM) 30-600 MG per 12 hr tablet Take 1 tablet by mouth every 12 (twelve) hours.        Marland Kitchen diltiazem (DILT-CD) 180 MG 24 hr capsule Take 1 capsule (180 mg total) by mouth every evening.  90 capsule  3  . esomeprazole (NEXIUM) 40 MG capsule Take 40 mg by mouth daily before breakfast.        . FLUoxetine (PROZAC) 20 MG capsule Take 1 capsule (20 mg total)  by mouth at bedtime.  90 capsule  3  . Ginkgo Biloba 40 MG TABS Take by mouth daily.        Marland Kitchen glucose blood (ONETOUCH VERIO IQ) test strip Use as instructed  100 each  12  . indapamide (LOZOL) 1.25 MG tablet Take 2 tablets (2.5 mg total) by mouth every morning.  180 tablet  3  . levothyroxine (SYNTHROID) 75 MCG tablet Take 1 tablet (75 mcg total) by mouth daily.  90 tablet  3  . losartan (COZAAR) 100 MG tablet Take 1 tablet (100 mg total) by mouth daily.  90 tablet  3  . metoCLOPramide (REGLAN) 10 MG tablet Take 10 mg by mouth 4 (four) times daily.        . mometasone (NASONEX) 50 MCG/ACT nasal spray Place 2 sprays into the nose daily.        . Multiple Vitamins-Minerals (CENTRUM SILVER PO) Take by mouth daily.        Marland Kitchen omeprazole (PRILOSEC) 20 MG capsule Take 20 mg by mouth daily.        . ONE TOUCH LANCETS MISC Use as directed twice daily to test blood glucose levels.  200 each  11  . ONE TOUCH LANCETS MISC Use with One Touch Verio monitor as directed Twice Daily to test blood glucose levels. DX: 250.60  200 each  5  . potassium chloride  SA (KLOR-CON M20) 20 MEQ tablet Take 1 tablet (20 mEq total) by mouth daily.  90 tablet  3  . sucralfate (CARAFATE) 1 G tablet Take 1 g by mouth 4 (four) times daily.        . traMADol (ULTRAM) 50 MG tablet Take 50 mg by mouth every 6 (six) hours as needed.          Allergies  Allergen Reactions  . Amoxicillin Other (See Comments)    GI Issues.  . Codeine Sulfate   . Hydrochlorothiazide   . Iodine   . Iohexol      Code: HIVES, Desc: PATIENT STATES SHE BREAKS OUT IN HIVES FROM IV DYE 02/27/08/RM, Onset Date: 16109604   . Paroxetine   . Penicillins   . Prednisone     Family History  Problem Relation Age of Onset  . Heart disease Mother   . Lung cancer Father   . Stroke Mother     BP 110/68  Pulse 69  Temp(Src) 98.2 F (36.8 C) (Oral)  SpO2 98%   Review of Systems Denies fever, but she has slight wheezing    Objective:   Physical Exam VITAL SIGNS:  See vs page GENERAL: no distress LUNGS:  Clear to auscultation.    CXR: NAD    Assessment & Plan:  Acute bronchitis, new Asthma, mild, exac by acute bronchitis. Headache, prob due to URI

## 2011-07-26 NOTE — Patient Instructions (Addendum)
An x-ray is requested for you today.  You will receive a letter with results. i have sent a prescription to your pharmacy, for an antibiotic. I hope you feel better soon.  If you don't feel better by next week, please call back. here is a sample of "dulera-100."  take 1 puff 2x a day.  rinse mouth after using.

## 2011-07-27 ENCOUNTER — Telehealth: Payer: Self-pay | Admitting: *Deleted

## 2011-07-27 NOTE — Telephone Encounter (Signed)
Called pt to inform of chest xray results, pt informed (letter also mailed to pt). 

## 2011-08-07 ENCOUNTER — Ambulatory Visit (INDEPENDENT_AMBULATORY_CARE_PROVIDER_SITE_OTHER): Payer: Medicare PPO | Admitting: Cardiology

## 2011-08-07 ENCOUNTER — Encounter: Payer: Self-pay | Admitting: Cardiology

## 2011-08-07 VITALS — BP 126/90 | HR 89 | Resp 18 | Ht 59.0 in | Wt 176.1 lb

## 2011-08-07 DIAGNOSIS — I10 Essential (primary) hypertension: Secondary | ICD-10-CM

## 2011-08-07 DIAGNOSIS — E785 Hyperlipidemia, unspecified: Secondary | ICD-10-CM

## 2011-08-07 DIAGNOSIS — R0602 Shortness of breath: Secondary | ICD-10-CM

## 2011-08-07 DIAGNOSIS — E663 Overweight: Secondary | ICD-10-CM

## 2011-08-07 NOTE — Progress Notes (Signed)
HPI The patient is referred for evaluation of dyspnea. This started following a URI several weeks ago. She began to notice episodes of shortness of breath at rest. She feels like she has to start breathing heavy. She doesn't describe anxiety. She doesn't have pain with these. She will feel very weak with significant weakness in her legs. If this happens while she's walking she simply sits down for her she is even on the floor. She does not describe PND or orthopnea. She's not having palpitations, presyncope or syncope. Prior to this happening he was using a treadmill she hasn't been back on this. She's had no fevers or chills. She did become dyspneic in the office her oxygen sat was 100%.  Allergies  Allergen Reactions  . Amoxicillin Other (See Comments)    GI Issues.  . Codeine Sulfate   . Hydrochlorothiazide   . Iodine   . Iohexol      Code: HIVES, Desc: PATIENT STATES SHE BREAKS OUT IN HIVES FROM IV DYE 02/27/08/RM, Onset Date: 16109604   . Paroxetine   . Penicillins   . Prednisone     Current Outpatient Prescriptions  Medication Sig Dispense Refill  . ALPRAZolam (XANAX) 0.25 MG tablet Take 1 tablet (0.25 mg total) by mouth every 6 (six) hours as needed for anxiety.  90 tablet  2  . AMBULATORY NON FORMULARY MEDICATION Medication Name: Humabid 600 mg. Take 1 tablet by mouth twice daily.  60 tablet  1  . Calcium Carbonate-Vitamin D (CALCIUM 600+D) 600-200 MG-UNIT TABS Take by mouth daily.        . chlorpheniramine (CHLOR-TRIMETON) 4 MG tablet Take 4 mg by mouth every 6 (six) hours as needed.        . Cinnamon 500 MG TABS Take by mouth daily.        Marland Kitchen diltiazem (DILT-CD) 180 MG 24 hr capsule Take 1 capsule (180 mg total) by mouth every evening.  90 capsule  3  . FLUoxetine (PROZAC) 20 MG capsule Take 1 capsule (20 mg total) by mouth at bedtime.  90 capsule  3  . Ginkgo Biloba 40 MG TABS Take by mouth daily.        Marland Kitchen glucose blood (ONETOUCH VERIO IQ) test strip Use as instructed  100  each  12  . levothyroxine (SYNTHROID) 75 MCG tablet Take 1 tablet (75 mcg total) by mouth daily.  90 tablet  3  . losartan (COZAAR) 100 MG tablet Take 1 tablet (100 mg total) by mouth daily.  90 tablet  3  . mometasone (NASONEX) 50 MCG/ACT nasal spray Place 2 sprays into the nose daily.        . Multiple Vitamins-Minerals (CENTRUM SILVER PO) Take by mouth daily.        . ONE TOUCH LANCETS MISC Use as directed twice daily to test blood glucose levels.  200 each  11  . ONE TOUCH LANCETS MISC Use with One Touch Verio monitor as directed Twice Daily to test blood glucose levels. DX: 250.60  200 each  5  . potassium chloride SA (KLOR-CON M20) 20 MEQ tablet Take 1 tablet (20 mEq total) by mouth daily.  90 tablet  3  . benzonatate (TESSALON PERLES) 100 MG capsule Take 1 capsule (100 mg total) by mouth 3 (three) times daily as needed for cough.  50 capsule  1  . dextromethorphan-guaiFENesin (MUCINEX DM) 30-600 MG per 12 hr tablet Take 1 tablet by mouth every 12 (twelve) hours.        Marland Kitchen  esomeprazole (NEXIUM) 40 MG capsule Take 40 mg by mouth daily before breakfast.        . indapamide (LOZOL) 1.25 MG tablet Take 2 tablets (2.5 mg total) by mouth every morning.  180 tablet  3  . metoCLOPramide (REGLAN) 10 MG tablet Take 10 mg by mouth 4 (four) times daily.        Marland Kitchen omeprazole (PRILOSEC) 20 MG capsule Take 20 mg by mouth daily.        . sucralfate (CARAFATE) 1 G tablet Take 1 g by mouth 4 (four) times daily.        . traMADol (ULTRAM) 50 MG tablet Take 50 mg by mouth every 6 (six) hours as needed.          Past Medical History  Diagnosis Date  . Hypertension   . Hypothyroidism   . Sleep apnea     Dr Shelle Iron CPAP 2009  . Depression   . Diabetes mellitus type II     diet  . Allergic rhinitis   . Anxiety   . Chronic cough     Dr Sherene Sires, onset 11/2007, Sinus CT July 7,2010>neg, Resolved 10/2008 on ppi two times a day  . Obesity (BMI 30-39.9)   . Pneumonia   . Hiatal hernia   . GERD (gastroesophageal  reflux disease)   . Dyslipidemia   . Lung nodule   . Diverticulosis   . Colon polyp   . Arthritis     Past Surgical History  Procedure Date  . Left breast lumpectomy   . Tonsillectomy     x 2  . Cholecystectomy     1998 in [place]  . Rectocele repair   . Bladder suspension   . Ventral hernia repair   . Appendectomy   . Abdominal hysterectomy   . Nissen fundoplication     March '11 Dr Michaell Cowing    Family History  Problem Relation Age of Onset  . Stroke Mother 37  . Lung cancer Father     History   Social History  . Marital Status: Single    Spouse Name: N/A    Number of Children: N/A  . Years of Education: N/A   Occupational History  . reited Engineer, petroleum    Social History Main Topics  . Smoking status: Former Smoker -- 1.5 packs/day for 25 years    Types: Cigarettes    Quit date: 10/28/1985  . Smokeless tobacco: Never Used  . Alcohol Use: No  . Drug Use: No  . Sexually Active: Not Currently   Other Topics Concern  . Not on file   Social History Narrative   HSG. Married -divorced. 2 sons. 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 Patient does not get regular exercise. Loves to read. Strong faith. Lives alone.    ROS:  Positive for blurred vision, colitis, diffuse extremity tenderness left leg greater than right.Otherwise as stated in the HPI and negative for all other systems.   PHYSICAL EXAM BP 126/90  Pulse 89  Resp 18  Ht 4\' 11"  (1.499 m)  Wt 176 lb 1.9 oz (79.888 kg)  BMI 35.57 kg/m2 GENERAL:  Well appearing HEENT:  Pupils equal round and reactive, fundi not visualized, oral mucosa unremarkable NECK:  No jugular venous distention, waveform within normal limits, carotid upstroke brisk and symmetric, no bruits, no thyromegaly LYMPHATICS:  No cervical, inguinal adenopathy LUNGS:  Clear to auscultation bilaterally BACK:  No CVA tenderness CHEST:  Unremarkable HEART:  PMI not displaced or sustained,S1  and S2 within normal limits, no S3,  no S4, no clicks, no rubs, no murmurs ABD:  Flat, positive bowel sounds normal in frequency in pitch, no bruits, no rebound, no guarding, no midline pulsatile mass, no hepatomegaly, no splenomegaly EXT:  2 plus pulses throughout, no edema, no cyanosis no clubbing SKIN:  No rashes no nodules NEURO:  Cranial nerves II through XII grossly intact, motor grossly intact throughout [facility]:  Cognitively intact, oriented to person place and time  EKG:  07/20/11  sinus rhythm, rate 71, axis normal limits, intervals within normal limits, no acute ST-T wave changes. 08/07/2011  ASSESSMENT AND PLAN

## 2011-08-07 NOTE — Assessment & Plan Note (Signed)
Her dyspnea pattern witnessed in the office is quite unusual. It was not accompanied by objective oxygen saturations. I will check a BNP level. I will plan a stress perfusion study given her risk factors though I doubt that this is an anginal equivalent. She would not be a little walk on the treadmill so she will need a [employer].

## 2011-08-07 NOTE — Assessment & Plan Note (Signed)
The blood pressure is at target. No change in medications is indicated. We will continue with therapeutic lifestyle changes (TLC).  

## 2011-08-07 NOTE — Assessment & Plan Note (Signed)
The patient requests a nutritionist consult. I agree with this.

## 2011-08-07 NOTE — Patient Instructions (Addendum)
The current medical regimen is effective;  continue present plan and medications.  Please have blood work today (BNP)  Your physician has requested that you have a lexiscan myoview. For further information please visit [email or web address] Please follow instruction sheet, as given.  You are being referred for a nutritional consults for weight loss.  Follow up will be based on results

## 2011-08-07 NOTE — Assessment & Plan Note (Signed)
Lab Results  Component Value Date   CHOL 196 06/28/2011   HDL 62.30 06/28/2011   LDLCALC 112* 06/28/2011   LDLDIRECT 172.7 10/24/2010   TRIG 108.0 06/28/2011   CHOLHDL 3 06/28/2011   This is a reasonable level for primary prevention.

## 2011-08-15 ENCOUNTER — Ambulatory Visit (HOSPITAL_COMMUNITY): Payer: Medicare PPO | Attending: Cardiology | Admitting: Radiology

## 2011-08-15 VITALS — BP 120/69 | Ht 59.0 in | Wt 177.0 lb

## 2011-08-15 DIAGNOSIS — E785 Hyperlipidemia, unspecified: Secondary | ICD-10-CM | POA: Insufficient documentation

## 2011-08-15 DIAGNOSIS — R5381 Other malaise: Secondary | ICD-10-CM | POA: Insufficient documentation

## 2011-08-15 DIAGNOSIS — E119 Type 2 diabetes mellitus without complications: Secondary | ICD-10-CM | POA: Insufficient documentation

## 2011-08-15 DIAGNOSIS — Z87891 Personal history of nicotine dependence: Secondary | ICD-10-CM | POA: Insufficient documentation

## 2011-08-15 DIAGNOSIS — R0602 Shortness of breath: Secondary | ICD-10-CM | POA: Insufficient documentation

## 2011-08-15 DIAGNOSIS — R5383 Other fatigue: Secondary | ICD-10-CM | POA: Insufficient documentation

## 2011-08-15 DIAGNOSIS — I10 Essential (primary) hypertension: Secondary | ICD-10-CM

## 2011-08-15 DIAGNOSIS — R0609 Other forms of dyspnea: Secondary | ICD-10-CM | POA: Insufficient documentation

## 2011-08-15 DIAGNOSIS — R0989 Other specified symptoms and signs involving the circulatory and respiratory systems: Secondary | ICD-10-CM | POA: Insufficient documentation

## 2011-08-15 MED ORDER — REGADENOSON 0.4 MG/5ML IV SOLN
0.4000 mg | Freq: Once | INTRAVENOUS | Status: AC
  Administered 2011-08-15: 0.4 mg via INTRAVENOUS

## 2011-08-15 MED ORDER — TECHNETIUM TC 99M TETROFOSMIN IV KIT
32.9000 | PACK | Freq: Once | INTRAVENOUS | Status: AC | PRN
  Administered 2011-08-15: 32.9 via INTRAVENOUS

## 2011-08-15 MED ORDER — TECHNETIUM TC 99M TETROFOSMIN IV KIT
10.9000 | PACK | Freq: Once | INTRAVENOUS | Status: AC | PRN
  Administered 2011-08-15: 10.9 via INTRAVENOUS

## 2011-08-15 NOTE — Progress Notes (Signed)
[facility] SITE 3 NUCLEAR MED [address] [place] [address] [phone]  Cardiology Nuclear Med Study  [name] is a 71 y.o. female     MRN : [id number]     DOB: 1940-09-21  Procedure Date: 08/15/2011  Nuclear Med Background Indication for Stress Test:  Evaluation for Ischemia History:  n/a Cardiac Risk Factors: History of Smoking, Lipids and NIDDM  Symptoms:  DOE, Fatigue and SOB   Nuclear Pre-Procedure Caffeine/Decaff Intake:  None NPO After: 6:00am   Lungs:  clear O2 Sat: 96% on room air. IV 0.9% NS with Angio Cath:  22g  IV Site: R Forearm  IV Started by:  Stanton Kidney, EMT-P  Chest Size (in):  38 Cup Size: C  Height: 4\' 11"  (1.499 m)  Weight:  177 lb (80.287 kg)  BMI:  Body mass index is 35.75 kg/(m^2). Tech Comments:  NA    Nuclear Med Study 1 or 2 day study: 1 day  Stress Test Type:  Eugenie Birks  Reading MD: Marca Ancona, MD  Order Authorizing Provider:  J.Hochrein MD  Resting Radionuclide: Technetium 66m Tetrofosmin  Resting Radionuclide Dose: 10.9 mCi   Stress Radionuclide:  Technetium 28m Tetrofosmin  Stress Radionuclide Dose: 32.9 mCi           Stress Protocol Rest HR: 62 Stress HR: 82  Rest BP: 120/69 Stress BP: 134/56  Exercise Time (min): n/a METS: n/a   Predicted Max HR: 148 bpm % Max HR: 55.41 bpm Rate Pressure Product: 46962   Dose of Adenosine (mg):  n/a Dose of Lexiscan: 0.4 mg  Dose of Atropine (mg): n/a Dose of Dobutamine: n/a mcg/kg/min (at max HR)  Stress Test Technologist: Milana Na, EMT-P  Nuclear Technologist:  Domenic Polite, CNMT     Rest Procedure:  Myocardial perfusion imaging was performed at rest 45 minutes following the intravenous administration of Technetium 2m Tetrofosmin. Rest ECG: NSR - Normal EKG  Stress Procedure:  The patient received IV Lexiscan 0.4 mg over 15-seconds.  Technetium 62m Tetrofosmin injected at 30-seconds.  There were no significant changes, + sob, chest heaviness, and  abdominal pain with Lexiscan.  Quantitative spect images were obtained after a 45 minute delay. Stress ECG: No significant change from baseline ECG  QPS Raw Data Images:  Normal; no motion artifact; normal heart/lung ratio. Stress Images:  Normal homogeneous uptake in all areas of the myocardium. Rest Images:  Normal homogeneous uptake in all areas of the myocardium. Subtraction (SDS):  There is no evidence of scar or ischemia. Transient Ischemic Dilatation (Normal <1.22):  1.18 Lung/Heart Ratio (Normal <0.45):  0.36  Quantitative Gated Spect Images QGS EDV:  39 ml QGS ESV:  6 ml  Impression Exercise Capacity:  Lexiscan with no exercise. BP Response:  Normal blood pressure response. Clinical Symptoms:  Chest heaviness ECG Impression:  No significant ST segment change suggestive of ischemia. Comparison with Prior Nuclear Study: No images to compare  Overall Impression:  Normal stress nuclear study.  LV Ejection Fraction: 85%.  LV Wall Motion:  NL LV Function; NL Wall Motion  [employer]

## 2011-08-17 ENCOUNTER — Other Ambulatory Visit: Payer: Self-pay | Admitting: *Deleted

## 2011-08-17 DIAGNOSIS — R0602 Shortness of breath: Secondary | ICD-10-CM

## 2011-08-21 ENCOUNTER — Encounter: Payer: Self-pay | Admitting: Internal Medicine

## 2011-08-21 ENCOUNTER — Ambulatory Visit (INDEPENDENT_AMBULATORY_CARE_PROVIDER_SITE_OTHER): Payer: Medicare PPO | Admitting: Internal Medicine

## 2011-08-21 VITALS — BP 122/72 | HR 75 | Temp 98.0°F | Ht 59.0 in | Wt 179.2 lb

## 2011-08-21 DIAGNOSIS — R0600 Dyspnea, unspecified: Secondary | ICD-10-CM

## 2011-08-21 DIAGNOSIS — R0609 Other forms of dyspnea: Secondary | ICD-10-CM

## 2011-08-21 DIAGNOSIS — R0602 Shortness of breath: Secondary | ICD-10-CM

## 2011-08-21 NOTE — Progress Notes (Signed)
Subjective:    Patient ID: [name], female    DOB: 17-Aug-1940, 71 y.o.   MRN: [id number]  HPI History of Present Illness: 71 yobf quit smoking 1980 with tendency to bronchitis but relatively healthy until Sept 09  June 24, 2008 initial pulmonary never completely better after abuptly ill with "bronchitis" in Sept 09 with sensation of throat congestion with minimal mucus production and no sign sob though not very active. imp uacs rec Stop all oil based vitamins Prevacid 30 mg Take one 30-60 min before first and last meals of the day plus diet.  Aug 06, 2008 ov better until acutely worse x last sev days with coughing up discolored mucus green streaked with BRB, no sob or cp or epistaxis, itching sneezing or overt sinus c/o's.  .REC, hold asa for bleeding., mucinex dm, ceftin x 10d, tramadol and tessalon for cough, depo medrol shot given.   September 13, 2008--Return ov:  new med calendar. Brought all meds except her tramadol. Cough is better but now over last few days sinus are draining , nasal congesiton, stuffy Left side mainly.  September 22, 2008 ov using cpap at hs but very tired during the day with variable cough minimal sputum thick and slt beige, no bloody or purulent,  doesn't bother sleeping,  no sign sob.   Zyrtec not helping pnds and feels lump in throat.  not following med calendar consistently, didn't bring it to ov as instructed.  also c/o excess fatigue and hypersomnolence on cpap  October 27, 2008 ov all smiles no cough or sob over baseline.  rec d/c prevacid pm dose  December 03, 2008 worse  dry cough after change pm ppi started back 1 week before ov still overt hb and sore throat.  Pt denies any significant dyphagia, itching, sneezing,  nasal congestion or excess secretions ,  fever, chills, sweats, unintended wt loss, pleuritic or exertional cp.  Patient Instructions: 1)  Add pepcid 20mg  one at bedtime whenever cough or sorethroat is bothering you 2)  See calendar for  specific medication instructions and bring it back for each and every office visit for every healthcare provider you see.  Without it,  you may not receive the best quality medical care that we feel you deserve.  3)  See Patient Care Coordinator before leaving for GI eval   OV 08/21/2011  NEW COMPLAINT  FOR DYSPNEA  71 year old female  Body mass index is 36.19 kg/(m^2).  reports that she quit smoking about 25 years ago. Her smoking use included Cigarettes. She has a 37.5 pack-year smoking history. She has never used smokeless tobacco.  PCP is Illene Regulus, MD, MD Used to live in [place] and moved to [place] in 2005. Kids still in [place] and she ultimately wants to move back there. She used to see Dr Jerilee Hoh but not seen in nearly 3 years; now wants to switch to [facility]  Reports dyspnea. ABrupt onset 6 weeks ago. Prior to that no problem.  Says no dyspnea at rest in general but occassionaly happens even at rest when she is sitting and reading.Marland Kitchen Dyspnea occurs only during exertion but not every time or every exertion consisently. Howevever, when I ask her more specifically : she says she does not climb steps due to dyspnea, and is always dyspneic when she goes to mail box and back. Currently she is very keen on getting back to [facility]. Denies chest pain or cough currently but did have  cough with sinus chest pain 6 weeks ago. Trial of dulera did not help. DEnies edema, recent weight gain or chest pain     Saw dr Antoine Poche 08/07/11 and he noted "Her dyspnea pattern witnessed in the office is quite unusual. It was not accompanied by objective oxygen saturations. I will check a BNP level. I will plan a stress perfusion study given her risk factors though I doubt that this is an anginal equivalent. She would not be a little walk on the treadmill so she will need a Lexiscan Myoview."    Labs  CT Dec 2009 IMPRESSION:  1. Multifocal ill-defined nodular densities within the anterior  right  upper lobe. The distribution suggests an infectious or  inflammatory process, with neoplasm considered less likely. Post-  treatment follow-up with chest CT is recommended in several weeks  to confirm resolution.  2. No evidence of lymphadenopathy or pleural effusion.   CXR 07/26/2011  - no active cardiopulm disease  Lexiscan myoview 08/14/2011  - normal with ef 65%  Walk test 08/21/2011  - no desaturation  Review of Systems  Constitutional: Negative for fever and unexpected weight change.  HENT: Negative for ear pain, nosebleeds, congestion, sore throat, rhinorrhea, sneezing, trouble swallowing, dental problem, postnasal drip and sinus pressure.   Eyes: Negative for redness and itching.  Respiratory: Positive for shortness of breath. Negative for cough, chest tightness and wheezing.   Cardiovascular: Negative for palpitations and leg swelling.  Gastrointestinal: Negative for nausea and vomiting.  Genitourinary: Negative for dysuria.  Musculoskeletal: Negative for joint swelling.  Skin: Negative for rash.  Neurological: Negative for headaches.  Hematological: Does not bruise/bleed easily.  Psychiatric/Behavioral: Negative for dysphoric mood. The patient is not nervous/anxious.        Objective:   Physical Exam  Vitals reviewed. Constitutional: She is oriented to person, place, and time. She appears well-developed and well-nourished. No distress.       Body mass index is 36.19 kg/(m^2).   HENT:  Head: Normocephalic and atraumatic.  Right Ear: External ear normal.  Left Ear: External ear normal.  Mouth/Throat: Oropharynx is clear and moist. No oropharyngeal exudate.  Eyes: Conjunctivae and EOM are normal. Pupils are equal, round, and reactive to light. Right eye exhibits no discharge. Left eye exhibits no discharge. No scleral icterus.  Neck: Normal range of motion. Neck supple. No JVD present. No tracheal deviation present. No thyromegaly present.  Cardiovascular: Normal rate,  regular rhythm, normal heart sounds and intact distal pulses.  Exam reveals no gallop and no friction rub.   No murmur heard. Pulmonary/Chest: Effort normal and breath sounds normal. No respiratory distress. She has no wheezes. She has no rales. She exhibits no tenderness.  Abdominal: Soft. Bowel sounds are normal. She exhibits no distension and no mass. There is no tenderness. There is no rebound and no guarding.  Musculoskeletal: Normal range of motion. She exhibits no edema and no tenderness.  Lymphadenopathy:    She has no cervical adenopathy.  Neurological: She is alert and oriented to person, place, and time. She has normal reflexes. No cranial nerve deficit. She exhibits normal muscle tone. Coordination normal.  Skin: Skin is warm and dry. No rash noted. She is not diaphoretic. No erythema. No pallor.  Psychiatric: She has a normal mood and affect. Her behavior is normal. Judgment and thought content normal.          Assessment & Plan:

## 2011-08-21 NOTE — Patient Instructions (Addendum)
Unclear what beyong weight if any is making you short of breath Do full pft breathing test I will call you with result and decide next step AT a future followup will discuss low glycemic plan for weight managment

## 2011-08-22 ENCOUNTER — Telehealth: Payer: Self-pay | Admitting: Internal Medicine

## 2011-08-22 ENCOUNTER — Ambulatory Visit (INDEPENDENT_AMBULATORY_CARE_PROVIDER_SITE_OTHER): Payer: Medicare PPO | Admitting: Internal Medicine

## 2011-08-22 DIAGNOSIS — R0602 Shortness of breath: Secondary | ICD-10-CM

## 2011-08-22 DIAGNOSIS — R0600 Dyspnea, unspecified: Secondary | ICD-10-CM

## 2011-08-22 DIAGNOSIS — R942 Abnormal results of pulmonary function studies: Secondary | ICD-10-CM

## 2011-08-22 LAB — PULMONARY FUNCTION TEST

## 2011-08-22 NOTE — Progress Notes (Signed)
PFT done today. 

## 2011-08-22 NOTE — Assessment & Plan Note (Signed)
Unclear why abruptly dyspneic. Do not know if there is an etiollogy beyong her weight. Will get full PFT and reasses

## 2011-08-22 NOTE — Telephone Encounter (Signed)
On 2nd thoughts I canceled CT chest, she should have d-dimer and CBC, BMET. Based on these results, I will decide on CT without contrast or CT angio PE protocol  Please ensure

## 2011-08-22 NOTE — Telephone Encounter (Signed)
Spoke to patient. Reviewed pft: NEEDS HRCT chest (ordered). Please ensure it is set up   PFT -08/22/2011 shows isolated low dlco fev1 1.6L/105% fvc 04/20/95% TLC 3.3/88% DLCO 9.3/47%

## 2011-08-23 NOTE — Telephone Encounter (Signed)
Did you advise the pt that she does not need the CT and should have blood-work instead, or do I need to advise her? Carron Curie, CMA

## 2011-08-23 NOTE — Telephone Encounter (Signed)
I left message for her only but with details. She did not pick up phone

## 2011-08-24 NOTE — Telephone Encounter (Signed)
LMTCBx2. [name], CMA  

## 2011-08-27 NOTE — Telephone Encounter (Signed)
Pt aware and order placed.[name], CMA  

## 2011-08-27 NOTE — Telephone Encounter (Signed)
Pt return call to jennifer. Please cb at [phone]

## 2011-08-28 ENCOUNTER — Other Ambulatory Visit (INDEPENDENT_AMBULATORY_CARE_PROVIDER_SITE_OTHER): Payer: Medicare PPO

## 2011-08-28 DIAGNOSIS — R0989 Other specified symptoms and signs involving the circulatory and respiratory systems: Secondary | ICD-10-CM

## 2011-08-28 DIAGNOSIS — R942 Abnormal results of pulmonary function studies: Secondary | ICD-10-CM

## 2011-08-28 DIAGNOSIS — R0600 Dyspnea, unspecified: Secondary | ICD-10-CM

## 2011-08-28 LAB — CBC
Hemoglobin: 11.8 g/dL — ABNORMAL LOW (ref 12.0–15.0)
RDW: 15.9 % — ABNORMAL HIGH (ref 11.5–14.6)
WBC: 8.5 10*3/uL (ref 4.5–10.5)

## 2011-08-29 LAB — BASIC METABOLIC PANEL
CO2: 30 mEq/L (ref 19–32)
Chloride: 105 mEq/L (ref 96–112)
Creatinine, Ser: 1.1 mg/dL (ref 0.4–1.2)
Potassium: 4.7 mEq/L (ref 3.5–5.1)

## 2011-08-29 LAB — D-DIMER, QUANTITATIVE: D-Dimer, Quant: 0.27 ug/mL-FEU (ref 0.00–0.48)

## 2011-08-30 ENCOUNTER — Telehealth: Payer: Self-pay | Admitting: Internal Medicine

## 2011-08-30 DIAGNOSIS — R942 Abnormal results of pulmonary function studies: Secondary | ICD-10-CM

## 2011-08-30 DIAGNOSIS — R0600 Dyspnea, unspecified: Secondary | ICD-10-CM

## 2011-08-30 NOTE — Telephone Encounter (Signed)
Let her know blood test for blood clot in lung is negative. LAbs all okay except for mild anemia that is stable. Needs ct chest next to understand why she is short of breath. AFter that we call her with results and advise next step  Lab 08/28/11 1646  HGB 11.8*  HCT 36.3  WBC 8.5  PLT 336.0

## 2011-08-31 ENCOUNTER — Telehealth: Payer: Self-pay | Admitting: Internal Medicine

## 2011-08-31 NOTE — Telephone Encounter (Signed)
Pt aware. Codee Bloodworth, CMA  

## 2011-08-31 NOTE — Telephone Encounter (Signed)
Pt advised of results. Miran Kautzman, CMA  

## 2011-09-03 ENCOUNTER — Encounter: Payer: Self-pay | Admitting: Internal Medicine

## 2011-09-05 ENCOUNTER — Other Ambulatory Visit: Payer: Medicare PPO

## 2011-09-06 ENCOUNTER — Ambulatory Visit (INDEPENDENT_AMBULATORY_CARE_PROVIDER_SITE_OTHER)
Admission: RE | Admit: 2011-09-06 | Discharge: 2011-09-06 | Disposition: A | Discharge status: Home / Self Care | Payer: Medicare PPO | Source: Ambulatory Visit | Attending: Internal Medicine | Admitting: Internal Medicine

## 2011-09-06 ENCOUNTER — Encounter: Payer: Medicare PPO | Attending: Cardiology | Admitting: *Deleted

## 2011-09-06 ENCOUNTER — Encounter: Payer: Self-pay | Admitting: *Deleted

## 2011-09-06 VITALS — Ht <= 58 in | Wt 180.4 lb

## 2011-09-06 DIAGNOSIS — Z713 Dietary counseling and surveillance: Secondary | ICD-10-CM | POA: Insufficient documentation

## 2011-09-06 DIAGNOSIS — E663 Overweight: Secondary | ICD-10-CM | POA: Insufficient documentation

## 2011-09-06 DIAGNOSIS — R0600 Dyspnea, unspecified: Secondary | ICD-10-CM

## 2011-09-06 DIAGNOSIS — R0989 Other specified symptoms and signs involving the circulatory and respiratory systems: Secondary | ICD-10-CM

## 2011-09-06 DIAGNOSIS — R942 Abnormal results of pulmonary function studies: Secondary | ICD-10-CM

## 2011-09-06 NOTE — Progress Notes (Signed)
  Medical Nutrition Therapy:  Appt start time: 1500 end time:  1600.  Assessment:  Primary concerns today: patient here today for obesity. 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 She lives alone, has followed many diets throughout her life and is frustrated with her weight issues. She brought several boxes of food and beverages she enjoys for my assessment of their nutritional value.  MEDICATIONS: see list   DIETARY INTAKE:  Usual eating pattern includes 3 meals and 1-2 snacks per day.  Everyday foods include good variety of all food groups.     24-hr recall:  B (11  AM): instant flavored oatmeal with cinnamon, soy or almond milk, cut up apple, occasionally adds fiber, protein or Boost High Protein OR Smoothie with yogurt, frozen berries, pomegranate juice, Weight Watchers powder and green food with ice,  with tea Snk ( AM): none or graham cracker L ( PM): salad with lettuce, onions, peppers, 2 oz [place] breast, Crystal Light or beverage with Stevia Snk ( PM): none D ( PM): fish, steak or other lean meat, vegetables, beans (in place of meat),  Snk ( PM): grazing thru the evening Beverages: fruit juice, water, Crystal Light beverages, tea  Usual physical activity: did not address at this visit  Estimated energy needs: to be determined at next visit   Progress Towards Goal(s):  In progress.   Nutritional Diagnosis:  NB-1.5 Disordered eating pattern As related to weight management.  As evidenced by BMI of 38.4 .    Intervention:  Nutrition counseling limited today to listening to her concerns. She states she is knowledgeable but sabotages her own efforts for appropriate eating. She is willing to assess her eating habits through journaling and to meet with me again in a few weeks to pursue the nutrition counseling based on her eating habits. Plan: Pick your special place in your home for eating. Decorate with nice placemat, a cloth napkin, pretty  plate Smoothie is for 2 meals Boost is one meal Journal your food intake so we can assess your habits more effectively  Handouts given during visit include:  Menu Planner as tool for journaling  After Visit Summary  Monitoring/Evaluation:  Dietary intake, exercise, Menu Planner, and body weight in 3 week(s).

## 2011-09-06 NOTE — Patient Instructions (Addendum)
Plan: Pick your special place in your home for eating. Decorate with nice placemat, a cloth napkin, pretty plate Smoothie is for 2 meals Boost is one meal Journal your food intake so we can assess your habits more effectively

## 2011-09-07 ENCOUNTER — Telehealth: Payer: Self-pay | Admitting: Internal Medicine

## 2011-09-07 ENCOUNTER — Other Ambulatory Visit: Payer: Medicare PPO

## 2011-09-07 NOTE — Telephone Encounter (Signed)
Pt is requesting her CT results. Pt aware MR is out of the office. Please advise MR, thanks

## 2011-09-09 NOTE — Telephone Encounter (Signed)
[name],  So far with dyspnea workup   - cardiac stress test normal  - PFT showed isolated low diffusion - seen in emphysema, ILD, anemia and pulmonary hypertension. The CT chest done few days ago has ruled out emphysema and ILD. Her Hgb is nearly 12gm%. So, we are left looking at pulmonary hypertension and my clinical suspicion for that is low-moderate anyways  _ Wondering if we should proceed next with right heart cath ?   - Alternatively, we can do pulmonary stress test ?  Let me know your thoughts. I am on the fence  Thanks Jillann Charette  PS: I will call her back; I have spoken to her of my plan

## 2011-09-12 NOTE — Telephone Encounter (Signed)
Ok please do that right heart cath with shunt run.I wil follow results and then schedule ov for her   Thanks a lot  MR

## 2011-09-12 NOTE — Telephone Encounter (Signed)
I can schedule her for a right heart cath with a shunt run.

## 2011-09-14 ENCOUNTER — Encounter: Payer: Self-pay | Admitting: *Deleted

## 2011-09-14 ENCOUNTER — Other Ambulatory Visit: Payer: Self-pay | Admitting: *Deleted

## 2011-09-14 DIAGNOSIS — I10 Essential (primary) hypertension: Secondary | ICD-10-CM

## 2011-09-14 DIAGNOSIS — Z01810 Encounter for preprocedural cardiovascular examination: Secondary | ICD-10-CM

## 2011-09-14 DIAGNOSIS — R0602 Shortness of breath: Secondary | ICD-10-CM

## 2011-09-14 NOTE — Telephone Encounter (Signed)
Pt scheduled for Right Heart Cath with Shunt Run on 10/05/2011 with Dr Rollene Rotunda in the [facility] lab at [facility].  She is to report at 8:30 am for a 9:30 am case.  Date time and instructions reviewed and mailed to pt's home address.  She will return to this office 7/15 for blood work.

## 2011-09-18 ENCOUNTER — Ambulatory Visit: Payer: Medicare PPO | Admitting: *Deleted

## 2011-10-01 ENCOUNTER — Other Ambulatory Visit (INDEPENDENT_AMBULATORY_CARE_PROVIDER_SITE_OTHER): Payer: Medicare PPO

## 2011-10-01 DIAGNOSIS — I10 Essential (primary) hypertension: Secondary | ICD-10-CM

## 2011-10-01 DIAGNOSIS — R0602 Shortness of breath: Secondary | ICD-10-CM

## 2011-10-01 DIAGNOSIS — Z01810 Encounter for preprocedural cardiovascular examination: Secondary | ICD-10-CM

## 2011-10-01 LAB — BASIC METABOLIC PANEL
Calcium: 9.1 mg/dL (ref 8.4–10.5)
Creatinine, Ser: 0.9 mg/dL (ref 0.4–1.2)

## 2011-10-01 LAB — CBC
HCT: 35.7 % — ABNORMAL LOW (ref 36.0–46.0)
Hemoglobin: 11.6 g/dL — ABNORMAL LOW (ref 12.0–15.0)
MCHC: 32.6 g/dL (ref 30.0–36.0)
RDW: 16.1 % — ABNORMAL HIGH (ref 11.5–14.6)

## 2011-10-04 ENCOUNTER — Other Ambulatory Visit: Payer: Self-pay | Admitting: Cardiology

## 2011-10-04 DIAGNOSIS — R0600 Dyspnea, unspecified: Secondary | ICD-10-CM

## 2011-10-05 ENCOUNTER — Encounter (HOSPITAL_BASED_OUTPATIENT_CLINIC_OR_DEPARTMENT_OTHER)
Admission: RE | Disposition: A | Discharge status: Home / Self Care | Payer: Self-pay | Source: Ambulatory Visit | Attending: Cardiology

## 2011-10-05 ENCOUNTER — Inpatient Hospital Stay (HOSPITAL_BASED_OUTPATIENT_CLINIC_OR_DEPARTMENT_OTHER)
Admission: RE | Admit: 2011-10-05 | Discharge: 2011-10-05 | Disposition: A | Discharge status: Home / Self Care | Payer: Medicare PPO | Source: Ambulatory Visit | Attending: Cardiology | Admitting: Cardiology

## 2011-10-05 DIAGNOSIS — G4730 Sleep apnea, unspecified: Secondary | ICD-10-CM | POA: Insufficient documentation

## 2011-10-05 DIAGNOSIS — R0609 Other forms of dyspnea: Secondary | ICD-10-CM | POA: Insufficient documentation

## 2011-10-05 DIAGNOSIS — I10 Essential (primary) hypertension: Secondary | ICD-10-CM | POA: Insufficient documentation

## 2011-10-05 DIAGNOSIS — F3289 Other specified depressive episodes: Secondary | ICD-10-CM | POA: Insufficient documentation

## 2011-10-05 DIAGNOSIS — R0602 Shortness of breath: Secondary | ICD-10-CM

## 2011-10-05 DIAGNOSIS — R0600 Dyspnea, unspecified: Secondary | ICD-10-CM

## 2011-10-05 DIAGNOSIS — E785 Hyperlipidemia, unspecified: Secondary | ICD-10-CM | POA: Insufficient documentation

## 2011-10-05 DIAGNOSIS — F329 Major depressive disorder, single episode, unspecified: Secondary | ICD-10-CM | POA: Insufficient documentation

## 2011-10-05 DIAGNOSIS — E669 Obesity, unspecified: Secondary | ICD-10-CM | POA: Insufficient documentation

## 2011-10-05 DIAGNOSIS — E039 Hypothyroidism, unspecified: Secondary | ICD-10-CM | POA: Insufficient documentation

## 2011-10-05 DIAGNOSIS — E119 Type 2 diabetes mellitus without complications: Secondary | ICD-10-CM | POA: Insufficient documentation

## 2011-10-05 DIAGNOSIS — R0989 Other specified symptoms and signs involving the circulatory and respiratory systems: Secondary | ICD-10-CM | POA: Insufficient documentation

## 2011-10-05 DIAGNOSIS — K219 Gastro-esophageal reflux disease without esophagitis: Secondary | ICD-10-CM | POA: Insufficient documentation

## 2011-10-05 LAB — POCT I-STAT 3, VENOUS BLOOD GAS (G3P V)
Acid-base deficit: 1 mmol/L (ref 0.0–2.0)
Bicarbonate: 22.8 mEq/L (ref 20.0–24.0)
Bicarbonate: 24.5 mEq/L — ABNORMAL HIGH (ref 20.0–24.0)
Bicarbonate: 25 mEq/L — ABNORMAL HIGH (ref 20.0–24.0)
Bicarbonate: 25.3 mEq/L — ABNORMAL HIGH (ref 20.0–24.0)
O2 Saturation: 62 %
O2 Saturation: 63 %
TCO2: 25 mmol/L (ref 0–100)
TCO2: 26 mmol/L (ref 0–100)
TCO2: 26 mmol/L (ref 0–100)
pCO2, Ven: 41.7 mmHg — ABNORMAL LOW (ref 45.0–50.0)
pCO2, Ven: 42.9 mmHg — ABNORMAL LOW (ref 45.0–50.0)
pCO2, Ven: 43.6 mmHg — ABNORMAL LOW (ref 45.0–50.0)
pH, Ven: 7.346 — ABNORMAL HIGH (ref 7.250–7.300)
pH, Ven: 7.361 — ABNORMAL HIGH (ref 7.250–7.300)
pH, Ven: 7.366 — ABNORMAL HIGH (ref 7.250–7.300)
pH, Ven: 7.37 — ABNORMAL HIGH (ref 7.250–7.300)
pO2, Ven: 33 mmHg (ref 30.0–45.0)
pO2, Ven: 33 mmHg (ref 30.0–45.0)
pO2, Ven: 34 mmHg (ref 30.0–45.0)

## 2011-10-05 SURGERY — JV LEFT AND RIGHT HEART CATHETERIZATION WITH CORONARY ANGIOGRAM
Anesthesia: Moderate Sedation

## 2011-10-05 MED ORDER — ASPIRIN 81 MG PO CHEW
324.0000 mg | CHEWABLE_TABLET | ORAL | Status: AC
  Administered 2011-10-05: 243 mg via ORAL

## 2011-10-05 MED ORDER — SODIUM CHLORIDE 0.9 % IJ SOLN: 3.0000 mL | Freq: Two times a day (BID) | INTRAMUSCULAR | Status: DC

## 2011-10-05 MED ORDER — SODIUM CHLORIDE 0.9 % IJ SOLN: 3.0000 mL | INTRAMUSCULAR | Status: DC | PRN

## 2011-10-05 MED ORDER — SODIUM CHLORIDE 0.9 % IV SOLN: 250.0000 mL | INTRAVENOUS | Status: DC | PRN

## 2011-10-05 MED ORDER — SODIUM CHLORIDE 0.9 % IV SOLN: 250.0000 mL | INTRAVENOUS | Status: DC

## 2011-10-05 MED ORDER — ACETAMINOPHEN 325 MG PO TABS: 650.0000 mg | ORAL_TABLET | ORAL | Status: DC | PRN

## 2011-10-05 MED ORDER — ONDANSETRON HCL 4 MG/2ML IJ SOLN: 4.0000 mg | Freq: Four times a day (QID) | INTRAMUSCULAR | Status: DC | PRN

## 2011-10-05 NOTE — OR Nursing (Signed)
Dr Antoine Poche at bedside to discuss results and treatment plan with pt and friend

## 2011-10-05 NOTE — OR Nursing (Signed)
Tegaderm dressing applied, site level 0, bedrest 1035

## 2011-10-05 NOTE — OR Nursing (Signed)
Meal served 

## 2011-10-05 NOTE — OR Nursing (Signed)
Discharge instructions reviewed and signed, pt stated understanding, ambulated in hall without difficulty, site level 0, transported to friend's car via wheelchair 

## 2011-10-05 NOTE — CV Procedure (Signed)
  Cardiac Catheterization Procedure Note  Name: [name] MRN: [id number] Procedure: Right Heart Cath  Indication:  Dyspnea  Procedural Details: The right groin was prepped, draped, and anesthetized with 1% lidocaine. Using the modified Seldinger technique a  7 French sheath was placed in the right femoral vein. A Swan-Ganz catheter was used for the right heart catheterization. Standard protocol was followed for recording of right heart pressures and sampling of oxygen saturations. Fick cardiac output was calculated. There were no immediate procedural complications. The patient was transferred to the post catheterization recovery area for further monitoring.  Procedural Findings:  Hemodynamics:               RA Mean 4    RV 33/4    PA 33/13 mean 22      PCWP Mean 8          Oxygen saturations:    IVC  69    RA (low)  60    RA (mid)  62    RA (high)  61    SVC  63    RV  60     PA  60   Cardiac Output (Fick) 4                              Cardiac Index (Fick) 2.3  Final Conclusions:  Normal right heart pressures and saturations.  No evidence of intracardiac shunt.  Recommendations: No further work up.  Rollene Rotunda 10/05/2011, 10:27 AM

## 2011-10-05 NOTE — H&P (Signed)
[name]   MRN: [id number]  Provider: Rollene Rotunda, MD    Referring Provider     [name], MD         Reason for Visit     Shortness of Breath      HPI The patient is referred for evaluation of dyspnea. This started following a URI earlier in the year. She began to notice episodes of shortness of breath at rest. She feels like she has to start breathing heavy. She doesn't describe anxiety. She doesn't have pain with these. She will feel very weak with significant weakness in her legs. If this happens while she's walking she simply sits down for her she is even on the floor. She does not describe PND or orthopnea. She's not having palpitations, presyncope or syncope. Prior to this happening he was using a treadmill she hasn't been back on this. She's had no fevers or chills. She did become dyspneic in the office her oxygen sat was 100%.  After our initial visit I sent her for a stress test which showed no ischemia with a well preserved EF.  She had PFTs and a noncontrasted CT that demonstrated no clear etiology for the dyspnea.  After discussion with Dr. Phillis Knack who has been guiding this evaluation he suggested and I agree with right heart catheterization with shunt run to further evaluate her symptoms.      Allergies   Allergen  Reactions   .  Amoxicillin  Other (See Comments)       GI Issues.   .  Codeine Sulfate     .  Hydrochlorothiazide     .  Iodine     .  Iohexol         Code: HIVES, Desc: PATIENT STATES SHE BREAKS OUT IN HIVES FROM IV DYE 02/27/08/RM, Onset Date: [id number]     .  Paroxetine     .  Penicillins     .  Prednisone         Current Outpatient Prescriptions   Medication  Sig  Dispense  Refill   .  ALPRAZolam (XANAX) 0.25 MG tablet  Take 1 tablet (0.25 mg total) by mouth every 6 (six) hours as needed for anxiety.   90 tablet   2   .  AMBULATORY NON FORMULARY MEDICATION  Medication Name: Humabid 600 mg. Take 1 tablet by mouth twice daily.   60  tablet   1   .  Calcium Carbonate-Vitamin D (CALCIUM 600+D) 600-200 MG-UNIT TABS  Take by mouth daily.           .  chlorpheniramine (CHLOR-TRIMETON) 4 MG tablet  Take 4 mg by mouth every 6 (six) hours as needed.           .  Cinnamon 500 MG TABS  Take by mouth daily.           Marland Kitchen  diltiazem (DILT-CD) 180 MG 24 hr capsule  Take 1 capsule (180 mg total) by mouth every evening.   90 capsule   3   .  FLUoxetine (PROZAC) 20 MG capsule  Take 1 capsule (20 mg total) by mouth at bedtime.   90 capsule   3   .  Ginkgo Biloba 40 MG TABS  Take by mouth daily.           Marland Kitchen  glucose blood (ONETOUCH VERIO IQ) test strip  Use as instructed   100 each   12   .  levothyroxine (SYNTHROID) 75 MCG tablet  Take 1 tablet (75 mcg total) by mouth daily.   90 tablet   3   .  losartan (COZAAR) 100 MG tablet  Take 1 tablet (100 mg total) by mouth daily.   90 tablet   3   .  mometasone (NASONEX) 50 MCG/ACT nasal spray  Place 2 sprays into the nose daily.           .  Multiple Vitamins-Minerals (CENTRUM SILVER PO)  Take by mouth daily.           .  ONE TOUCH LANCETS MISC  Use as directed twice daily to test blood glucose levels.   200 each   11   .  ONE TOUCH LANCETS MISC  Use with One Touch Verio monitor as directed Twice Daily to test blood glucose levels. DX: 250.60   200 each   5   .  potassium chloride SA (KLOR-CON M20) 20 MEQ tablet  Take 1 tablet (20 mEq total) by mouth daily.   90 tablet   3   .  benzonatate (TESSALON PERLES) 100 MG capsule  Take 1 capsule (100 mg total) by mouth 3 (three) times daily as needed for cough.   50 capsule   1   .  dextromethorphan-guaiFENesin (MUCINEX DM) 30-600 MG per 12 hr tablet  Take 1 tablet by mouth every 12 (twelve) hours.           Marland Kitchen  esomeprazole (NEXIUM) 40 MG capsule  Take 40 mg by mouth daily before breakfast.           .  indapamide (LOZOL) 1.25 MG tablet  Take 2 tablets (2.5 mg total) by mouth every morning.   180 tablet   3   .  metoCLOPramide (REGLAN) 10 MG tablet  Take 10  mg by mouth 4 (four) times daily.           Marland Kitchen  omeprazole (PRILOSEC) 20 MG capsule  Take 20 mg by mouth daily.           .  sucralfate (CARAFATE) 1 G tablet  Take 1 g by mouth 4 (four) times daily.           .  traMADol (ULTRAM) 50 MG tablet  Take 50 mg by mouth every 6 (six) hours as needed.               Past Medical History   Diagnosis  Date   .  Hypertension     .  Hypothyroidism     .  Sleep apnea         Dr Shelle Iron CPAP 2009   .  Depression     .  Diabetes mellitus type II         diet   .  Allergic rhinitis     .  Anxiety     .  Chronic cough         Dr Sherene Sires, onset 11/2007, Sinus CT July 7,2010>neg, Resolved 10/2008 on ppi two times a day   .  Obesity (BMI 30-39.9)     .  Pneumonia     .  Hiatal hernia     .  GERD (gastroesophageal reflux disease)     .  Dyslipidemia     .  Lung nodule     .  Diverticulosis     .  Colon polyp     .  Arthritis  Past Surgical History   Procedure  Date   .  Left breast lumpectomy     .  Tonsillectomy         x 2   .  Cholecystectomy         1998 in [place]   .  Rectocele repair     .  Bladder suspension     .  Ventral hernia repair     .  Appendectomy     .  Abdominal hysterectomy     .  Nissen fundoplication         March '11 Dr Michaell Cowing       Family History   .  Stroke  Mother  50   .  Lung cancer  Father         History       Social History   .  Marital Status:  Single   .  reited Engineer, petroleum     .  Smoking status:  Former Smoker -- 1.5 packs/day for 25 years       Types:  Cigarettes       Quit date:  10/28/1985   . HSG. Married -divorced. 2 sons. 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 Patient does not get regular exercise. Loves to read. Strong faith. Lives alone.    ROS:  Positive for blurred vision, colitis, diffuse extremity tenderness left leg greater than right.Otherwise as stated in the HPI and negative for all other systems.    PHYSICAL EXAM BP 126/90  Pulse 89  Resp 18  Ht 4\' 11"   (1.499 m)  Wt 176 lb 1.9 oz (79.888 kg)  BMI 35.57 kg/m2 GENERAL:  Well appearing HEENT:  Pupils equal round and reactive, fundi not visualized, oral mucosa unremarkable NECK:  No jugular venous distention, waveform within normal limits, carotid upstroke brisk and symmetric, no bruits, no thyromegaly LYMPHATICS:  No cervical, inguinal adenopathy LUNGS:  Clear to auscultation bilaterally BACK:  No CVA tenderness CHEST:  Unremarkable HEART:  PMI not displaced or sustained,S1 and S2 within normal limits, no S3, no S4, no clicks, no rubs, no murmurs ABD:  Flat, positive bowel sounds normal in frequency in pitch, no bruits, no rebound, no guarding, no midline pulsatile mass, no hepatomegaly, no splenomegaly EXT:  2 plus pulses throughout, no edema, no cyanosis no clubbing SKIN:  No rashes no nodules NEURO:  Cranial nerves II through XII grossly intact, motor grossly intact throughout PSYCH:  Cognitively intact, oriented to person place and time  ASSESSMENT AND PLAN   SOB (shortness of breath) - Evaluation as above.  Plan for right heart catheterization to further evaluate.  The patient understands that risks included but are not limited to stroke (1 in 1000), death (1 in 1000), bleeding (1 in 200) The patient understands and agrees to proceed.

## 2011-10-06 ENCOUNTER — Telehealth: Payer: Self-pay | Admitting: Internal Medicine

## 2011-10-06 NOTE — Telephone Encounter (Signed)
Please let patient know that so far all tests for shortness of breath workup are negative including right heart cath 10/05/11. This is good news and increasingly suggests weight likely playing a role in shortness of breath. She can either come in September to discuss options or in the interim do the final mother of all tests for shortness of breath, a bike pulmonary stress tests  (has to beike for 12 minutes). IF she agrees to latter, please place order  If you cannot or unwilling or  not comfortable communicating this message and/or  need me to communicate this information to patient  please let me know via epic message. I would normally communicate this but am traveling

## 2011-10-09 ENCOUNTER — Telehealth: Payer: Self-pay | Admitting: Adult Health

## 2011-10-09 NOTE — Telephone Encounter (Signed)
I spoke with the pt and she states she is coming in to see TP on Monday for dyspnea follow-up and wanted to know if she can have labs needed by cardiology drawn here as well. I advised the pt to discuss this with TP at the time of visit and she may agree to order this. Carron Curie, CMA

## 2011-10-09 NOTE — Telephone Encounter (Signed)
Pt advised of results and she wants to wait until follow-up to decide on any testing. She has an appt with TP on Monday for dyspnea follow-up.Carron Curie, CMA

## 2011-10-15 ENCOUNTER — Telehealth: Payer: Self-pay | Admitting: Internal Medicine

## 2011-10-15 ENCOUNTER — Ambulatory Visit (INDEPENDENT_AMBULATORY_CARE_PROVIDER_SITE_OTHER): Payer: Medicare PPO | Admitting: Adult Health

## 2011-10-15 ENCOUNTER — Encounter: Payer: Self-pay | Admitting: Adult Health

## 2011-10-15 ENCOUNTER — Ambulatory Visit: Payer: Medicare PPO | Admitting: *Deleted

## 2011-10-15 VITALS — BP 114/72 | HR 78 | Temp 98.6°F | Ht 59.0 in | Wt 183.0 lb

## 2011-10-15 DIAGNOSIS — R0602 Shortness of breath: Secondary | ICD-10-CM

## 2011-10-15 NOTE — Patient Instructions (Addendum)
Work on [employer] loss Advance activity as tolerated  Follow up Dr. Marchelle Gearing in 3 months and As needed

## 2011-10-15 NOTE — Progress Notes (Signed)
Subjective:    Patient ID: [name], female    DOB: Apr 15, 1940, 71 y.o.   MRN: [id number]  HPI History of Present Illness: 47 yobf quit smoking 1980 with tendency to bronchitis but relatively healthy until Sept 09  June 24, 2008 initial pulmonary never completely better after abuptly ill with "bronchitis" in Sept 09 with sensation of throat congestion with minimal mucus production and no sign sob though not very active. imp uacs rec Stop all oil based vitamins Prevacid 30 mg Take one 30-60 min before first and last meals of the day plus diet.  Aug 06, 2008 ov better until acutely worse x last sev days with coughing up discolored mucus green streaked with BRB, no sob or cp or epistaxis, itching sneezing or overt sinus c/o's.  .REC, hold asa for bleeding., mucinex dm, ceftin x 10d, tramadol and tessalon for cough, depo medrol shot given.   September 13, 2008--Return ov:  new med calendar. Brought all meds except her tramadol. Cough is better but now over last few days sinus are draining , nasal congesiton, stuffy Left side mainly.  September 22, 2008 ov using cpap at hs but very tired during the day with variable cough minimal sputum thick and slt beige, no bloody or purulent,  doesn't bother sleeping,  no sign sob.   Zyrtec not helping pnds and feels lump in throat.  not following med calendar consistently, didn't bring it to ov as instructed.  also c/o excess fatigue and hypersomnolence on cpap  October 27, 2008 ov all smiles no cough or sob over baseline.  rec d/c prevacid pm dose  December 03, 2008 worse  dry cough after change pm ppi started back 1 week before ov still overt hb and sore throat.  Pt denies any significant dyphagia, itching, sneezing,  nasal congestion or excess secretions ,  fever, chills, sweats, unintended wt loss, pleuritic or exertional cp.  Patient Instructions: 1)  Add pepcid 20mg  one at bedtime whenever cough or sorethroat is bothering you 2)  See calendar for  specific medication instructions and bring it back for each and every office visit for every healthcare provider you see.  Without it,  you may not receive the best quality medical care that we feel you deserve.  3)  See Patient Care Coordinator before leaving for GI eval   OV 08/21/2011  NEW COMPLAINT  FOR DYSPNEA  71 year old female  Body mass index is 36.19 kg/(m^2).  reports that she quit smoking about 25 years ago. Her smoking use included Cigarettes. She has a 37.5 pack-year smoking history. She has never used smokeless tobacco.  PCP is Illene Regulus, MD, MD Used to live in [place] and moved to [place] in 2005. Kids still in [place] and she ultimately wants to move back there. She used to see Dr Jerilee Hoh but not seen in nearly 3 years; now wants to switch to [facility]  Reports dyspnea. ABrupt onset 6 weeks ago. Prior to that no problem.  Says no dyspnea at rest in general but occassionaly happens even at rest when she is sitting and reading.Marland Kitchen Dyspnea occurs only during exertion but not every time or every exertion consisently. Howevever, when I ask her more specifically : she says she does not climb steps due to dyspnea, and is always dyspneic when she goes to mail box and back. Currently she is very keen on getting [facility]. Denies chest pain or cough currently but did have  cough with sinus chest pain 6 weeks ago. Trial of dulera did not help. DEnies edema, recent weight gain or chest pain  Saw dr Antoine Poche 08/07/11 and he noted "Her dyspnea pattern witnessed in the office is quite unusual. It was not accompanied by objective oxygen saturations. I will check a BNP level. I will plan a stress perfusion study given her risk factors though I doubt that this is an anginal equivalent. She would not be a little walk on the treadmill so she will need a Lexiscan Myoview."    Labs  CT Dec 2009 IMPRESSION:  1. Multifocal ill-defined nodular densities within the anterior  right  upper lobe. The distribution suggests an infectious or  inflammatory process, with neoplasm considered less likely. Post-  treatment follow-up with chest CT is recommended in several weeks  to confirm resolution.  2. No evidence of lymphadenopathy or pleural effusion.   CXR 07/26/2011  - no active cardiopulm disease  Lexiscan myoview 08/14/2011  - normal with ef 65%  Walk test 08/21/2011  - no desaturation   10/15/11 Follow up  Returns for follow up of her dyspnea  Has had an extensive workup for dyspnea with neg myoview No desaturations with walking  cxr and CT chest  with no acute process  Right heart cath neg for Pulm HTN  We discussed obesity and deconditioning and she is very upset and tearful regarding her weight  She does not want to proceed to a cardiopulmonary stress test .  We discussed nutrition/dietician referral which she says she is currently seeing to help with wt loss.  She wears out with easily , stress eats.  No chest pain , edema or n/v/d.     Review of Systems  Constitutional:   No  weight loss, night sweats,  Fevers, chills,  ++fatigue, or  lassitude.  HEENT:   No headaches,  Difficulty swallowing,  Tooth/dental problems, or  Sore throat,                No sneezing, itching, ear ache, nasal congestion, post nasal drip,   CV:  No chest pain,  Orthopnea, PND, swelling in lower extremities, anasarca, dizziness, palpitations, syncope.   GI  No heartburn, indigestion, abdominal pain, nausea, vomiting, diarrhea, change in bowel habits, loss of appetite, bloody stools.   Resp:   No non-productive cough,  No coughing up of blood.  No change in color of mucus.  No wheezing.  No chest wall deformity  Skin: no rash or lesions.  GU: no dysuria, change in color of urine, no urgency or frequency.  No flank pain, no hematuria   MS:  No joint pain or swelling.  No decreased range of motion.  No back pain.  Psych:    No memory loss.         Objective:   Physical  Exam  GEN: A/Ox3; pleasant , NAD over weight   HEENT:  [place]/AT,  EACs-clear, TMs-wnl, NOSE-clear, THROAT-clear, no lesions, no postnasal drip or exudate noted.   NECK:  Supple w/ fair ROM; no JVD; normal carotid impulses w/o bruits; no thyromegaly or nodules palpated; no lymphadenopathy.  RESP  Coarse BS  w/o, wheezes/ rales/ or rhonchi.no accessory muscle use, no dullness to percussion  CARD:  RRR, no m/r/g  , no peripheral edema, pulses intact, no cyanosis or clubbing.  GI:   Soft & nt; nml bowel sounds; no organomegaly or masses detected.  Musco: Warm bil, no deformities or joint swelling noted.  Neuro: alert, no focal deficits noted.    Skin: Warm, no lesions or rashes

## 2011-10-18 NOTE — Assessment & Plan Note (Addendum)
?  etiology ? Component of deconditioning and morbid obesity  Extensive w/up with CT chest , cxr, myoview, right heart cath, labs have been unrevealing.  Long discussion regarding healthy living , diet and nutrition and wt loss.  Cont w/ dietician follow up  Declines CPST   Plan;  'Work on weight loss Advance activity as tolerated  Follow up Dr. Marchelle Gearing in 3 months and As needed

## 2011-10-18 NOTE — Telephone Encounter (Signed)
I spoke with the pt and appt set to see MR on 10-22-11 to discuss treatment options. Carron Curie, CMA

## 2011-10-22 ENCOUNTER — Encounter: Payer: Self-pay | Admitting: Internal Medicine

## 2011-10-22 ENCOUNTER — Ambulatory Visit (INDEPENDENT_AMBULATORY_CARE_PROVIDER_SITE_OTHER): Payer: Medicare PPO | Admitting: Internal Medicine

## 2011-10-22 ENCOUNTER — Ambulatory Visit: Payer: Medicare PPO | Admitting: Internal Medicine

## 2011-10-22 VITALS — BP 122/82 | HR 81 | Temp 97.9°F | Ht 59.0 in | Wt 183.0 lb

## 2011-10-22 DIAGNOSIS — R0989 Other specified symptoms and signs involving the circulatory and respiratory systems: Secondary | ICD-10-CM

## 2011-10-22 DIAGNOSIS — E669 Obesity, unspecified: Secondary | ICD-10-CM

## 2011-10-22 DIAGNOSIS — R0602 Shortness of breath: Secondary | ICD-10-CM

## 2011-10-22 DIAGNOSIS — R0600 Dyspnea, unspecified: Secondary | ICD-10-CM

## 2011-10-22 NOTE — Telephone Encounter (Signed)
Ok thanks. Noted 

## 2011-10-22 NOTE — Progress Notes (Signed)
Subjective:    Patient ID: [name], female    DOB: 11-07-1940, 71 y.o.   MRN: [id number]  HPI 60 yobf quit smoking 1980 with tendency to bronchitis but relatively healthy until Sept 09  June 24, 2008 initial pulmonary never completely better after abuptly ill with "bronchitis" in Sept 09 with sensation of throat congestion with minimal mucus production and no sign sob though not very active. imp uacs rec Stop all oil based vitamins Prevacid 30 mg Take one 30-60 min before first and last meals of the day plus diet.  Aug 06, 2008 ov better until acutely worse x last sev days with coughing up discolored mucus green streaked with BRB, no sob or cp or epistaxis, itching sneezing or overt sinus c/o's.  .REC, hold asa for bleeding., mucinex dm, ceftin x 10d, tramadol and tessalon for cough, depo medrol shot given.   September 13, 2008--Return ov:  new med calendar. Brought all meds except her tramadol. Cough is better but now over last few days sinus are draining , nasal congesiton, stuffy Left side mainly.  September 22, 2008 ov using cpap at hs but very tired during the day with variable cough minimal sputum thick and slt beige, no bloody or purulent,  doesn't bother sleeping,  no sign sob.   Zyrtec not helping pnds and feels lump in throat.  not following med calendar consistently, didn't bring it to ov as instructed.  also c/o excess fatigue and hypersomnolence on cpap  October 27, 2008 ov all smiles no cough or sob over baseline.  rec d/c prevacid pm dose  December 03, 2008 worse  dry cough after change pm ppi started back 1 week before ov still overt hb and sore throat.  Pt denies any significant dyphagia, itching, sneezing,  nasal congestion or excess secretions ,  fever, chills, sweats, unintended wt loss, pleuritic or exertional cp.  Patient Instructions: 1)  Add pepcid 20mg  one at bedtime whenever cough or sorethroat is bothering you 2)  See calendar for specific medication instructions and  bring it back for each and every office visit for every healthcare provider you see.  Without it,  you may not receive the best quality medical care that we feel you deserve.  3)  See Patient Care Coordinator before leaving for GI eval   OV 08/21/2011  NEW COMPLAINT  FOR DYSPNEA  71 year old female  Body mass index is 36.19 kg/(m^2).  reports that she quit smoking about 25 years ago. Her smoking use included Cigarettes. She has a 37.5 pack-year smoking history. She has never used smokeless tobacco.  PCP is Illene Regulus, MD, MD Used to live in [place] and moved to [place] in 2005. Kids still in [place] and she ultimately wants to move back there. She used to see Dr Jerilee Hoh but not seen in nearly 3 years; now wants to switch to [facility]  Reports dyspnea. ABrupt onset 6 weeks ago. Prior to that no problem.  Says no dyspnea at rest in general but occassionaly happens even at rest when she is sitting and reading.Marland Kitchen Dyspnea occurs only during exertion but not every time or every exertion consisently. Howevever, when I ask her more specifically : she says she does not climb steps due to dyspnea, and is always dyspneic when she goes to mail box and back. Currently she is very keen on getting back to [facility]. Denies chest pain or cough currently but did have cough with sinus chest  pain 6 weeks ago. Trial of dulera did not help. DEnies edema, recent weight gain or chest pain  Saw dr Antoine Poche 08/07/11 and he noted "Her dyspnea pattern witnessed in the office is quite unusual. It was not accompanied by objective oxygen saturations. I will check a BNP level. I will plan a stress perfusion study given her risk factors though I doubt that this is an anginal equivalent. She would not be a little walk on the treadmill so she will need a Lexiscan Myoview."    Labs  CT Dec 2009 IMPRESSION:  1. Multifocal ill-defined nodular densities within the anterior  right upper lobe. The distribution suggests an  infectious or  inflammatory process, with neoplasm considered less likely. Post-  treatment follow-up with chest CT is recommended in several weeks  to confirm resolution.  2. No evidence of lymphadenopathy or pleural effusion.   CXR 07/26/2011  - no active cardiopulm disease  Lexiscan myoview 08/14/2011  - normal with ef 65%  Walk test 08/21/2011  - no desaturation   10/15/11 Follow up - TAMMY PARRETT NP Returns for follow up of her dyspnea  Has had an extensive workup for dyspnea with neg myoview No desaturations with walking  cxr and CT chest  with no acute process  Right heart cath neg for Pulm HTN  We discussed obesity and deconditioning and she is very upset and tearful regarding her weight  She does not want to proceed to a cardiopulmonary stress test .  We discussed nutrition/dietician referral which she says she is currently seeing to help with wt loss.  She wears out with easily , stress eats.  No chest pain , edema or n/v/d.   REC  Work on weight loss  Advance activity as tolerated  Follow up Dr. Marchelle Gearing in 3 months and As needed     OV 10/22/2011 Followup for review of dyspnea workup so far. Saw NP for same few days ago but wants MD opinion as well. No change in symptoms. No new issues. She is seeking help from a dietician through her church support group to work on the emotional aspects of eating which she says is a problem.   Past, Family, Social reviewed: no change since last visit    Review of Systems  Constitutional: Negative for fever and unexpected weight change.  HENT: Negative for ear pain, nosebleeds, congestion, sore throat, rhinorrhea, sneezing, trouble swallowing, dental problem, postnasal drip and sinus pressure.   Eyes: Negative for redness and itching.  Respiratory: Negative for cough, chest tightness, shortness of breath and wheezing.   Cardiovascular: Negative for palpitations and leg swelling.  Gastrointestinal: Negative for nausea and  vomiting.  Genitourinary: Negative for dysuria.  Musculoskeletal: Negative for joint swelling.  Skin: Negative for rash.  Neurological: Negative for headaches.  Hematological: Does not bruise/bleed easily.  Psychiatric/Behavioral: Negative for dysphoric mood. The patient is not nervous/anxious.        Objective:   Physical Exam GEN: A/Ox3; pleasant , NAD over weight   HEENT:  [place]/AT,  EACs-clear, TMs-wnl, NOSE-clear, THROAT-clear, no lesions, no postnasal drip or exudate noted.   NECK:  Supple w/ fair ROM; no JVD; normal carotid impulses w/o bruits; no thyromegaly or nodules palpated; no lymphadenopathy.  RESP  Coarse BS  w/o, wheezes/ rales/ or rhonchi.no accessory muscle use, no dullness to percussion  CARD:  RRR, no m/r/g  , no peripheral edema, pulses intact, no cyanosis or clubbing.  GI:   Soft & nt; nml bowel sounds; no  organomegaly or masses detected.  Musco: Warm bil, no deformities or joint swelling noted.   Neuro: alert, no focal deficits noted.    Skin: Warm, no lesions or rashes        Assessment & Plan:

## 2011-10-22 NOTE — Patient Instructions (Addendum)
I will refer you to pulmonary rehab for shortness of breath Take [facility] Diet sheet; show your nutritionist this and have her help you eat left lane foods consistently Return in 3 months or sooner if needed

## 2011-10-25 NOTE — Assessment & Plan Note (Addendum)
Inconclusive workup so far for both cardiac and pulmonary causes of pneumonia. Likely obesity, deconditioning major primary causes of dyspnea. Discussed CPST testing but she believes that likely weight is the issue. She prefers to try pulmonary rehab (discussed this) and weight loss and then reassess. I gave her low glycemic sheet that she will share with her nutritionist. Flollowup in few months  > 50% of this 15 min visit spent in face to face counseling

## 2011-10-30 ENCOUNTER — Telehealth: Payer: Self-pay | Admitting: Internal Medicine

## 2011-10-30 NOTE — Telephone Encounter (Signed)
I spoke with pt and she states she has not heard from pulm rehab yet. Order was placed 10/22/11. i advised her they do have a waiting list and can take 3-4 weeks to hear back from them. She voiced her understanding and needed nothing further

## 2011-11-13 ENCOUNTER — Ambulatory Visit (INDEPENDENT_AMBULATORY_CARE_PROVIDER_SITE_OTHER): Payer: Medicare PPO | Admitting: Internal Medicine

## 2011-11-13 ENCOUNTER — Encounter: Payer: Self-pay | Admitting: Internal Medicine

## 2011-11-13 VITALS — BP 120/82 | HR 71 | Temp 98.0°F | Resp 16 | Wt 180.0 lb

## 2011-11-13 DIAGNOSIS — R0602 Shortness of breath: Secondary | ICD-10-CM

## 2011-11-13 DIAGNOSIS — J309 Allergic rhinitis, unspecified: Secondary | ICD-10-CM

## 2011-11-13 NOTE — Progress Notes (Signed)
Subjective:    Patient ID: [name], female    DOB: Jun 30, 1940, 71 y.o.   MRN: [id number]  HPI [name] presents for acute discomfort with sinus pressure, sore throat. She denies any fevers, chills, sweats. No purulent drainage. She feels she has severe allergies and body-wide inflammation.  She has had a full evaluation for shortness of breath: PFTs, right heart cath that was normal, Myoview stress. She continues to c/o DOE  PMH, FamHx and SocHx reviewed for any changes and relevance.  Current Outpatient Prescriptions on File Prior to Visit  Medication Sig Dispense Refill  . ALPRAZolam (XANAX) 0.25 MG tablet Take 1 tablet (0.25 mg total) by mouth every 6 (six) hours as needed for anxiety.  90 tablet  2  . aspirin 81 MG tablet Take 81 mg by mouth daily.      . Calcium Carbonate-Vitamin D (CALCIUM 600+D) 600-200 MG-UNIT TABS Take by mouth daily.        . Cinnamon 500 MG TABS Take by mouth daily.        Marland Kitchen diltiazem (DILT-CD) 180 MG 24 hr capsule Take 1 capsule (180 mg total) by mouth every evening.  90 capsule  3  . FLUoxetine (PROZAC) 20 MG capsule Take 1 capsule (20 mg total) by mouth at bedtime.  90 capsule  3  . Ginkgo Biloba 40 MG TABS Take by mouth daily.        Marland Kitchen glucose blood (ONETOUCH VERIO IQ) test strip Use as instructed  100 each  12  . indapamide (LOZOL) 1.25 MG tablet Take 2.5 mg by mouth every morning.      Marland Kitchen levothyroxine (SYNTHROID) 75 MCG tablet Take 1 tablet (75 mcg total) by mouth daily.  90 tablet  3  . losartan (COZAAR) 100 MG tablet Take 1 tablet (100 mg total) by mouth daily.  90 tablet  3  . mometasone (NASONEX) 50 MCG/ACT nasal spray Place 2 sprays into the nose daily.        . Multiple Vitamins-Minerals (CENTRUM SILVER PO) Take by mouth daily.        . ONE TOUCH LANCETS MISC Use as directed twice daily to test blood glucose levels.  200 each  11  . ONE TOUCH LANCETS MISC Use with One Touch Verio monitor as directed Twice Daily to test blood glucose  levels. DX: 250.60  200 each  5  . potassium chloride SA (KLOR-CON M20) 20 MEQ tablet Take 1 tablet (20 mEq total) by mouth daily.  90 tablet  3  . Probiotic Product (PROBIOTIC & ACIDOPHILUS EX ST PO) Take 1 tablet by mouth daily.      . benzonatate (TESSALON PERLES) 100 MG capsule Take 1 capsule (100 mg total) by mouth 3 (three) times daily as needed for cough.  50 capsule  1  . chlorpheniramine (CHLOR-TRIMETON) 4 MG tablet Take 4 mg by mouth every 6 (six) hours as needed.        Marland Kitchen dextromethorphan-guaiFENesin (MUCINEX DM) 30-600 MG per 12 hr tablet Take 1 tablet by mouth every 12 (twelve) hours.        . traMADol (ULTRAM) 50 MG tablet Take 50 mg by mouth every 6 (six) hours as needed.            Review of Systems System review is negative for any constitutional, cardiac, pulmonary, GI or neuro symptoms or complaints other than as described in the HPI.     Objective:   Physical Exam Filed Vitals:   11/13/11  1637  BP: 120/82  Pulse: 71  Temp: 98 F (36.7 C)  Resp: 16   Wt Readings from Last 3 Encounters:  11/13/11 180 lb (81.647 kg)  10/22/11 183 lb (83.008 kg)  10/15/11 183 lb (83.008 kg)   Gen'l- small statured AA woman in no acute distress HEENT- TMs normal, Throat clear, C&S clear Cor- RRR Pulm - normal respirations       Assessment & Plan:  Allergic Rhinitis  - no evidence of bacterial infection.  Plan dymista sample provided to use for allergy -   No antibiotics  Referral to Dr. Sharyn Lull.

## 2011-11-13 NOTE — Patient Instructions (Addendum)
No sign of a bacterial infection, thus no need for an antibiotics. This may be allergy. Plan - a trial of dymista 1 spray to each nostril twice a day.  Riding your self of inflammation: the probiotic (Align), live culture yogurt.  Allergy evaluation - referral to Dr. Laurette Schimke.

## 2011-11-14 ENCOUNTER — Other Ambulatory Visit: Payer: Self-pay | Admitting: Internal Medicine

## 2011-11-14 DIAGNOSIS — Z1231 Encounter for screening mammogram for malignant neoplasm of breast: Secondary | ICD-10-CM

## 2011-12-18 HISTORY — PX: EYE SURGERY: SHX253

## 2011-12-20 ENCOUNTER — Telehealth: Payer: Self-pay | Admitting: Internal Medicine

## 2011-12-20 NOTE — Telephone Encounter (Signed)
ATC pt x 3 to make next ov per recall.  Left 3 messages and pt never called [place] back.  Mailed recall letter 12/20/11 Leanora Ivanoff

## 2012-01-02 ENCOUNTER — Encounter (INDEPENDENT_AMBULATORY_CARE_PROVIDER_SITE_OTHER): Payer: Medicare PPO | Admitting: Ophthalmology

## 2012-01-02 DIAGNOSIS — I10 Essential (primary) hypertension: Secondary | ICD-10-CM

## 2012-01-02 DIAGNOSIS — H2510 Age-related nuclear cataract, unspecified eye: Secondary | ICD-10-CM

## 2012-01-02 DIAGNOSIS — H35379 Puckering of macula, unspecified eye: Secondary | ICD-10-CM

## 2012-01-02 DIAGNOSIS — H3530 Unspecified macular degeneration: Secondary | ICD-10-CM

## 2012-01-02 DIAGNOSIS — H43819 Vitreous degeneration, unspecified eye: Secondary | ICD-10-CM

## 2012-01-02 DIAGNOSIS — H35039 Hypertensive retinopathy, unspecified eye: Secondary | ICD-10-CM

## 2012-01-10 ENCOUNTER — Telehealth (HOSPITAL_COMMUNITY): Payer: Self-pay | Admitting: *Deleted

## 2012-01-10 NOTE — Telephone Encounter (Signed)
Attempt made to contact [name] on 01/07/2012 regarding referral to [facility].  Message left for patient to return call to [facility].  Letter also sent for patient to contact Pulmonary Rehab.

## 2012-01-22 ENCOUNTER — Encounter (HOSPITAL_COMMUNITY)
Admission: RE | Admit: 2012-01-22 | Discharge: 2012-01-22 | Disposition: A | Discharge status: Home / Self Care | Payer: Medicare PPO | Source: Ambulatory Visit | Attending: Internal Medicine | Admitting: Internal Medicine

## 2012-01-22 ENCOUNTER — Encounter (HOSPITAL_COMMUNITY): Payer: Self-pay

## 2012-01-22 DIAGNOSIS — Z5189 Encounter for other specified aftercare: Secondary | ICD-10-CM | POA: Insufficient documentation

## 2012-01-22 DIAGNOSIS — R0989 Other specified symptoms and signs involving the circulatory and respiratory systems: Secondary | ICD-10-CM | POA: Insufficient documentation

## 2012-01-22 DIAGNOSIS — R0609 Other forms of dyspnea: Secondary | ICD-10-CM | POA: Insufficient documentation

## 2012-01-22 NOTE — Progress Notes (Signed)
Ms. Rogness came in today for orientation to Pulmonary Rehab.  Health history and medications reviewed with patient.  Goals set.  6 min walk test done, oxygen levels were stable at 99-100% on room air.  Heart rate 67-91 during test.  Demonstration and practice of PLB using pulse oximeter.  Patient able to return demonstration satisfactorily. Safety and hand hygiene in the exercise area reviewed with patient.  Patient voices understanding.  She plans to start exercise on 01/29/12.  We look forward to working with this very nice and personable lady.

## 2012-01-24 ENCOUNTER — Ambulatory Visit (INDEPENDENT_AMBULATORY_CARE_PROVIDER_SITE_OTHER): Payer: Medicare PPO | Admitting: *Deleted

## 2012-01-24 ENCOUNTER — Encounter (HOSPITAL_COMMUNITY): Payer: Medicare PPO

## 2012-01-24 DIAGNOSIS — Z23 Encounter for immunization: Secondary | ICD-10-CM

## 2012-01-29 ENCOUNTER — Encounter (HOSPITAL_COMMUNITY)
Admission: RE | Admit: 2012-01-29 | Discharge: 2012-01-29 | Disposition: A | Discharge status: Home / Self Care | Payer: Medicare PPO | Source: Ambulatory Visit | Attending: Internal Medicine | Admitting: Internal Medicine

## 2012-01-31 ENCOUNTER — Encounter (HOSPITAL_COMMUNITY)
Admission: RE | Admit: 2012-01-31 | Discharge: 2012-01-31 | Disposition: A | Discharge status: Home / Self Care | Payer: Medicare PPO | Source: Ambulatory Visit | Attending: Internal Medicine | Admitting: Internal Medicine

## 2012-01-31 NOTE — Progress Notes (Signed)
Second day of exercise, she is very compliant and doing well.  Vital signs stable, oxygen above 90%.

## 2012-02-05 ENCOUNTER — Encounter (HOSPITAL_COMMUNITY)
Admission: RE | Admit: 2012-02-05 | Discharge: 2012-02-05 | Disposition: A | Discharge status: Home / Self Care | Payer: Medicare PPO | Source: Ambulatory Visit | Attending: Internal Medicine | Admitting: Internal Medicine

## 2012-02-05 NOTE — Progress Notes (Signed)
Completed home exercise with patient. Reviewed exercise progression, routine, exercising at a comfortable pace, RPE/Dyspnea scales, how important it is to own a pulse oximeter and how to use one, weather conditions, warning signs and symptoms with exercise, and CP/NTG. We discussed when to call MD. Patient voices understanding. Patient has a goal to improve shortness of breath, to be more aware of her diet, to respect and treat her body as the marvelous temple it is, and to be obedient to my trainers. Will continue to encourage and support.

## 2012-02-07 ENCOUNTER — Encounter (HOSPITAL_COMMUNITY)
Admission: RE | Admit: 2012-02-07 | Discharge: 2012-02-07 | Disposition: A | Discharge status: Home / Self Care | Payer: Medicare PPO | Source: Ambulatory Visit | Attending: Internal Medicine | Admitting: Internal Medicine

## 2012-02-08 ENCOUNTER — Ambulatory Visit
Admission: RE | Admit: 2012-02-08 | Discharge: 2012-02-08 | Disposition: A | Discharge status: Home / Self Care | Payer: Medicare PPO | Source: Ambulatory Visit | Attending: Internal Medicine | Admitting: Internal Medicine

## 2012-02-08 DIAGNOSIS — Z1231 Encounter for screening mammogram for malignant neoplasm of breast: Secondary | ICD-10-CM

## 2012-02-12 ENCOUNTER — Encounter (HOSPITAL_COMMUNITY)
Admission: RE | Admit: 2012-02-12 | Discharge: 2012-02-12 | Disposition: A | Discharge status: Home / Self Care | Payer: Medicare PPO | Source: Ambulatory Visit | Attending: Internal Medicine | Admitting: Internal Medicine

## 2012-02-14 ENCOUNTER — Encounter (HOSPITAL_COMMUNITY): Payer: Medicare PPO

## 2012-02-19 ENCOUNTER — Encounter (HOSPITAL_COMMUNITY): Payer: Medicare PPO

## 2012-02-21 ENCOUNTER — Encounter (HOSPITAL_COMMUNITY): Payer: Medicare PPO

## 2012-02-26 ENCOUNTER — Telehealth: Payer: Self-pay | Admitting: Internal Medicine

## 2012-02-26 ENCOUNTER — Encounter (HOSPITAL_COMMUNITY): Payer: Medicare PPO

## 2012-02-26 NOTE — Telephone Encounter (Signed)
Patient is calling to see if she can increase her Prozac due to increase in depression over the winter months.  States that she has had an increase in depression and started taking Prozac 20mg  in the morning and 20mg  at night verses only 20mg  at night.  Patient states that she feels much better with the increase.  She is due a refill on her script and has put a hold on it at [facility] until she hears back from the provider as to whether or not the increase is approved.  If ok to increase, a new script needs to be faxed to [facility] at [phone].   OFFICE NOTE: PLEASE FOLLOW UP ON SCRIPT REQUEST.

## 2012-02-27 MED ORDER — FLUOXETINE HCL 20 MG PO CAPS: 20.0000 mg | ORAL_CAPSULE | Freq: Two times a day (BID) | ORAL | Status: DC

## 2012-02-27 NOTE — Telephone Encounter (Signed)
Ok for this   - to robin to adjust, and new rx if needed for bid prozac 20 mg

## 2012-02-27 NOTE — Telephone Encounter (Signed)
Called the patient left detailed message rx sent in.

## 2012-02-28 ENCOUNTER — Encounter (HOSPITAL_COMMUNITY): Payer: Medicare PPO

## 2012-03-04 ENCOUNTER — Encounter (HOSPITAL_COMMUNITY)
Admission: RE | Admit: 2012-03-04 | Discharge: 2012-03-04 | Disposition: A | Discharge status: Home / Self Care | Payer: Medicare PPO | Source: Ambulatory Visit | Attending: Internal Medicine | Admitting: Internal Medicine

## 2012-03-04 DIAGNOSIS — R0609 Other forms of dyspnea: Secondary | ICD-10-CM | POA: Insufficient documentation

## 2012-03-04 DIAGNOSIS — Z5189 Encounter for other specified aftercare: Secondary | ICD-10-CM | POA: Insufficient documentation

## 2012-03-04 DIAGNOSIS — R0989 Other specified symptoms and signs involving the circulatory and respiratory systems: Secondary | ICD-10-CM | POA: Insufficient documentation

## 2012-03-06 ENCOUNTER — Encounter (HOSPITAL_COMMUNITY)
Admission: RE | Admit: 2012-03-06 | Discharge: 2012-03-06 | Disposition: A | Discharge status: Home / Self Care | Payer: Medicare PPO | Source: Ambulatory Visit | Attending: Internal Medicine | Admitting: Internal Medicine

## 2012-03-11 ENCOUNTER — Encounter (HOSPITAL_COMMUNITY)
Admission: RE | Admit: 2012-03-11 | Discharge: 2012-03-11 | Disposition: A | Discharge status: Home / Self Care | Payer: Medicare PPO | Source: Ambulatory Visit | Attending: Internal Medicine | Admitting: Internal Medicine

## 2012-03-13 ENCOUNTER — Encounter (HOSPITAL_COMMUNITY)
Admission: RE | Admit: 2012-03-13 | Discharge: 2012-03-13 | Disposition: A | Discharge status: Home / Self Care | Payer: Medicare PPO | Source: Ambulatory Visit | Attending: Internal Medicine | Admitting: Internal Medicine

## 2012-03-18 ENCOUNTER — Encounter (HOSPITAL_COMMUNITY): Payer: Medicare PPO

## 2012-03-20 ENCOUNTER — Ambulatory Visit (INDEPENDENT_AMBULATORY_CARE_PROVIDER_SITE_OTHER): Payer: Medicare PPO | Admitting: Internal Medicine

## 2012-03-20 ENCOUNTER — Encounter (HOSPITAL_COMMUNITY): Payer: Medicare PPO

## 2012-03-20 ENCOUNTER — Encounter: Payer: Self-pay | Admitting: Internal Medicine

## 2012-03-20 VITALS — BP 118/68 | HR 74 | Temp 97.1°F | Resp 10 | Wt 180.0 lb

## 2012-03-20 DIAGNOSIS — J069 Acute upper respiratory infection, unspecified: Secondary | ICD-10-CM

## 2012-03-20 NOTE — Patient Instructions (Addendum)
Viral URI - no evidence of bacterial infection, thus no need for antibiotic  Plan Supportive Care: for sinus drainage - claritin 10 mg once a day (OTC); for pressure and congestion sudafed 30 mg twice a day ( behind the counter); Hot herbal tea with honey and lemon; tylenol 500 - 1,000 mg three times a day on schedule; sugar free robitussin DM 1 tsp every 4 hours as needed; plenty of rest and stay out of the cold.   Call for fever, Nausea or vomiting, increasing pain or discomfort.   Upper Respiratory Infection, Adult An upper respiratory infection (URI) is also sometimes known as the common cold. The upper respiratory tract includes the nose, sinuses, throat, trachea, and bronchi. Bronchi are the airways leading to the lungs. Most people improve within 1 week, but symptoms can last up to 2 weeks. A residual cough may last even longer.   CAUSES Many different viruses can infect the tissues lining the upper respiratory tract. The tissues become irritated and inflamed and often become very moist. Mucus production is also common. A cold is contagious. You can easily spread the virus to others by oral contact. This includes kissing, sharing a glass, coughing, or sneezing. Touching your mouth or nose and then touching a surface, which is then touched by another person, can also spread the virus. SYMPTOMS   Symptoms typically develop 1 to 3 days after you come in contact with a cold virus. Symptoms vary from person to person. They may include:  Runny nose.   Sneezing.   Nasal congestion.   Sinus irritation.   Sore throat.   Loss of voice (laryngitis).   Cough.   Fatigue.   Muscle aches.   Loss of appetite.   Headache.   Low-grade fever.  DIAGNOSIS   You might diagnose your own cold based on familiar symptoms, since most people get a cold 2 to 3 times a year. Your caregiver can confirm this based on your exam. Most importantly, your caregiver can check that your symptoms are not due to  another disease such as strep throat, sinusitis, pneumonia, asthma, or epiglottitis. Blood tests, throat tests, and X-rays are not necessary to diagnose a common cold, but they may sometimes be helpful in excluding other more serious diseases. Your caregiver will decide if any further tests are required. RISKS AND COMPLICATIONS   You may be at risk for a more severe case of the common cold if you smoke cigarettes, have chronic heart disease (such as heart failure) or lung disease (such as asthma), or if you have a weakened immune system. The very young and very old are also at risk for more serious infections. Bacterial sinusitis, middle ear infections, and bacterial pneumonia can complicate the common cold. The common cold can worsen asthma and chronic obstructive pulmonary disease (COPD). Sometimes, these complications can require emergency medical care and may be life-threatening. PREVENTION   The best way to protect against getting a cold is to practice good hygiene. Avoid oral or hand contact with people with cold symptoms. Wash your hands often if contact occurs. There is no clear evidence that vitamin C, vitamin E, echinacea, or exercise reduces the chance of developing a cold. However, it is always recommended to get plenty of rest and practice good nutrition. TREATMENT   Treatment is directed at relieving symptoms. There is no cure. Antibiotics are not effective, because the infection is caused by a virus, not by bacteria. Treatment may include:  Increased fluid intake. Sports drinks  offer valuable electrolytes, sugars, and fluids.   Breathing heated mist or steam (vaporizer or shower).   Eating chicken soup or other clear broths, and maintaining good nutrition.   Getting plenty of rest.   Using gargles or lozenges for comfort.   Controlling fevers with ibuprofen or acetaminophen as directed by your caregiver.   Increasing usage of your inhaler if you have asthma.  Zinc gel and zinc  lozenges, taken in the first 24 hours of the common cold, can shorten the duration and lessen the severity of symptoms. Pain medicines may help with fever, muscle aches, and throat pain. A variety of non-prescription medicines are available to treat congestion and runny nose. Your caregiver can make recommendations and may suggest nasal or lung inhalers for other symptoms.   HOME CARE INSTRUCTIONS    Only take over-the-counter or prescription medicines for pain, discomfort, or fever as directed by your caregiver.   Use a warm mist humidifier or inhale steam from a shower to increase air moisture. This may keep secretions moist and make it easier to breathe.   Drink enough water and fluids to keep your urine clear or pale yellow.   Rest as needed.   Return to work when your temperature has returned to normal or as your caregiver advises. You may need to stay home longer to avoid infecting others. You can also use a face mask and careful hand washing to prevent spread of the virus.  SEEK MEDICAL CARE IF:    After the first few days, you feel you are getting worse rather than better.   You need your caregiver's advice about medicines to control symptoms.   You develop chills, worsening shortness of breath, or brown or red sputum. These may be signs of pneumonia.   You develop yellow or brown nasal discharge or pain in the face, especially when you bend forward. These may be signs of sinusitis.   You develop a fever, swollen neck glands, pain with swallowing, or white areas in the back of your throat. These may be signs of strep throat.  SEEK IMMEDIATE MEDICAL CARE IF:    You have a fever.   You develop severe or persistent headache, ear pain, sinus pain, or chest pain.   You develop wheezing, a prolonged cough, cough up blood, or have a change in your usual mucus (if you have chronic lung disease).   You develop sore muscles or a stiff neck.  Document Released: 08/29/2000 Document  Revised: 05/28/2011 Document Reviewed: 07/07/2010 [facility] Patient Information 2013 [place], [place].

## 2012-03-20 NOTE — Progress Notes (Signed)
Subjective:    Patient ID: [name], female    DOB: 1940/07/22, 72 y.o.   MRN: [id number]  HPI [name] presents with a sore throat, non-productive cough and sinus congestion. No fever, no chills, no sinus pain. No N/V/D. Minimal SOB from baseline.  She has been diagnosed with silent reflux. She has been going to pulmonary rehab - doing well with this. She will have a big drop in CBG with working out.  PMH, FamHx and SocHx reviewed for any changes and relevance. Current Outpatient Prescriptions on File Prior to Visit  Medication Sig Dispense Refill  . ALPRAZolam (XANAX) 0.25 MG tablet Take 1 tablet (0.25 mg total) by mouth every 6 (six) hours as needed for anxiety.  90 tablet  2  . aspirin 81 MG tablet Take 81 mg by mouth daily.      . benzonatate (TESSALON PERLES) 100 MG capsule Take 1 capsule (100 mg total) by mouth 3 (three) times daily as needed for cough.  50 capsule  1  . Calcium Carbonate-Vitamin D (CALCIUM 600+D) 600-200 MG-UNIT TABS Take by mouth daily.        . chlorpheniramine (CHLOR-TRIMETON) 4 MG tablet Take 4 mg by mouth every 6 (six) hours as needed.        . Cinnamon 500 MG TABS Take by mouth daily.        Marland Kitchen dextromethorphan-guaiFENesin (MUCINEX DM) 30-600 MG per 12 hr tablet Take 1 tablet by mouth every 12 (twelve) hours.        Marland Kitchen diltiazem (DILT-CD) 180 MG 24 hr capsule Take 1 capsule (180 mg total) by mouth every evening.  90 capsule  3  . FLUoxetine (PROZAC) 20 MG capsule Take 1 capsule (20 mg total) by mouth 2 (two) times daily.  60 capsule  1  . Ginkgo Biloba 40 MG TABS Take by mouth daily.        Marland Kitchen glucose blood (ONETOUCH VERIO IQ) test strip Use as instructed  100 each  12  . indapamide (LOZOL) 1.25 MG tablet Take 2.5 mg by mouth every morning.      Marland Kitchen levothyroxine (SYNTHROID) 75 MCG tablet Take 1 tablet (75 mcg total) by mouth daily.  90 tablet  3  . losartan (COZAAR) 100 MG tablet Take 1 tablet (100 mg total) by mouth daily.  90 tablet  3  . Multiple  Vitamins-Minerals (CENTRUM SILVER PO) Take by mouth daily.        . ONE TOUCH LANCETS MISC Use as directed twice daily to test blood glucose levels.  200 each  11  . ONE TOUCH LANCETS MISC Use with One Touch Verio monitor as directed Twice Daily to test blood glucose levels. DX: 250.60  200 each  5  . potassium chloride SA (KLOR-CON M20) 20 MEQ tablet Take 1 tablet (20 mEq total) by mouth daily.  90 tablet  3  . Probiotic Product (PROBIOTIC & ACIDOPHILUS EX ST PO) Take 1 tablet by mouth daily.      . mometasone (NASONEX) 50 MCG/ACT nasal spray Place 2 sprays into the nose daily.        . traMADol (ULTRAM) 50 MG tablet Take 50 mg by mouth every 6 (six) hours as needed.           Review of Systems System review is negative for any constitutional, cardiac, pulmonary, GI or neuro symptoms or complaints other than as described in the HPI.     Objective:   Physical Exam Filed Vitals:  03/20/12 1508  BP: 118/68  Pulse: 74  Temp: 97.1 F (36.2 C)  Resp: 10   Wt Readings from Last 3 Encounters:  03/20/12 180 lb (81.647 kg)  11/13/11 180 lb (81.647 kg)  10/22/11 183 lb (83.008 kg)   Gen'l - overweight AA woman in no distress. HEENT- no sinus tenderness to percussion, posterior pharynx clear Neck -supple Nodes - none Cor - RRR Pulm - clear to A&P, no rales or wheezes. Neuor - A&O x 3, CN II-XII normal       Assessment & Plan:  Viral URI - no evidence of bacterial infection, thus no need for antibiotic  Plan Supportive Care: for sinus drainage - claritin 10 mg once a day (OTC); for pressure and congestion sudafed 30 mg twice a day ( behind the counter); Hot herbal tea with honey and lemon; tylenol 500 - 1,000 mg three times a day on schedule; sugar free robitussin DM 1 tsp every 4 hours as needed; plenty of rest and stay out of the cold.   Call for fever, Nausea or vomiting, increasing pain or discomfort.

## 2012-03-25 ENCOUNTER — Encounter (HOSPITAL_COMMUNITY): Payer: Medicare PPO

## 2012-03-27 ENCOUNTER — Ambulatory Visit (INDEPENDENT_AMBULATORY_CARE_PROVIDER_SITE_OTHER): Payer: Medicare PPO | Admitting: Internal Medicine

## 2012-03-27 ENCOUNTER — Encounter (HOSPITAL_COMMUNITY): Payer: Medicare PPO

## 2012-03-27 ENCOUNTER — Encounter: Payer: Self-pay | Admitting: Internal Medicine

## 2012-03-27 ENCOUNTER — Other Ambulatory Visit (INDEPENDENT_AMBULATORY_CARE_PROVIDER_SITE_OTHER): Payer: Medicare PPO

## 2012-03-27 VITALS — BP 110/72 | HR 77 | Temp 97.8°F | Resp 12 | Wt 179.1 lb

## 2012-03-27 DIAGNOSIS — E119 Type 2 diabetes mellitus without complications: Secondary | ICD-10-CM

## 2012-03-27 DIAGNOSIS — R7989 Other specified abnormal findings of blood chemistry: Secondary | ICD-10-CM

## 2012-03-27 DIAGNOSIS — R05 Cough: Secondary | ICD-10-CM

## 2012-03-27 LAB — CBC WITH DIFFERENTIAL/PLATELET
Basophils Absolute: 0 10*3/uL (ref 0.0–0.1)
Eosinophils Absolute: 0.2 10*3/uL (ref 0.0–0.7)
Eosinophils Relative: 1.6 % (ref 0.0–5.0)
MCV: 88.5 fl (ref 78.0–100.0)
Monocytes Absolute: 1 10*3/uL (ref 0.1–1.0)
Neutrophils Relative %: 56.9 % (ref 43.0–77.0)
Platelets: 368 10*3/uL (ref 150.0–400.0)
WBC: 9.6 10*3/uL (ref 4.5–10.5)

## 2012-03-27 LAB — COMPREHENSIVE METABOLIC PANEL
ALT: 24 U/L (ref 0–35)
AST: 23 U/L (ref 0–37)
Albumin: 3.9 g/dL (ref 3.5–5.2)
Alkaline Phosphatase: 72 U/L (ref 39–117)
Glucose, Bld: 89 mg/dL (ref 70–99)
Potassium: 4 mEq/L (ref 3.5–5.1)
Sodium: 137 mEq/L (ref 135–145)
Total Protein: 7.6 g/dL (ref 6.0–8.3)

## 2012-03-27 NOTE — Progress Notes (Signed)
Subjective:    Patient ID: [name], female    DOB: February 13, 1941, 72 y.o.   MRN: [id number]  HPI [name] was seen Jan 2 for a uri thought to be viral - note reviewed. She has been taking sudafed, robitussin, hot drinks but has continued to have fatigue, sinus congestion, rhinorrhea, cough - intermittently bringing up mucus, most clear. She had tessalon perles which did help the cough a little bit. She is worried and discouraged that her symptoms haven't yet cleared.  PMH, FamHx and SocHx reviewed for any changes and relevance. Current Outpatient Prescriptions on File Prior to Visit  Medication Sig Dispense Refill  . ALPRAZolam (XANAX) 0.25 MG tablet Take 1 tablet (0.25 mg total) by mouth every 6 (six) hours as needed for anxiety.  90 tablet  2  . aspirin 81 MG tablet Take 81 mg by mouth daily.      . benzonatate (TESSALON PERLES) 100 MG capsule Take 1 capsule (100 mg total) by mouth 3 (three) times daily as needed for cough.  50 capsule  1  . Calcium Carbonate-Vitamin D (CALCIUM 600+D) 600-200 MG-UNIT TABS Take by mouth daily.        . chlorpheniramine (CHLOR-TRIMETON) 4 MG tablet Take 4 mg by mouth every 6 (six) hours as needed.        . Cinnamon 500 MG TABS Take by mouth daily.        Marland Kitchen dextromethorphan-guaiFENesin (MUCINEX DM) 30-600 MG per 12 hr tablet Take 1 tablet by mouth every 12 (twelve) hours.        Marland Kitchen diltiazem (DILT-CD) 180 MG 24 hr capsule Take 1 capsule (180 mg total) by mouth every evening.  90 capsule  3  . FLUoxetine (PROZAC) 20 MG capsule Take 1 capsule (20 mg total) by mouth 2 (two) times daily.  60 capsule  1  . Ginkgo Biloba 40 MG TABS Take by mouth daily.        Marland Kitchen glucose blood (ONETOUCH VERIO IQ) test strip Use as instructed  100 each  12  . indapamide (LOZOL) 1.25 MG tablet Take 2.5 mg by mouth every morning.      Marland Kitchen levothyroxine (SYNTHROID) 75 MCG tablet Take 1 tablet (75 mcg total) by mouth daily.  90 tablet  3  . losartan (COZAAR) 100 MG tablet Take 1 tablet  (100 mg total) by mouth daily.  90 tablet  3  . mometasone (NASONEX) 50 MCG/ACT nasal spray Place 2 sprays into the nose daily.        . Multiple Vitamins-Minerals (CENTRUM SILVER PO) Take by mouth daily.        . ONE TOUCH LANCETS MISC Use as directed twice daily to test blood glucose levels.  200 each  11  . ONE TOUCH LANCETS MISC Use with One Touch Verio monitor as directed Twice Daily to test blood glucose levels. DX: 250.60  200 each  5  . potassium chloride SA (KLOR-CON M20) 20 MEQ tablet Take 1 tablet (20 mEq total) by mouth daily.  90 tablet  3  . Probiotic Product (PROBIOTIC & ACIDOPHILUS EX ST PO) Take 1 tablet by mouth daily.      . traMADol (ULTRAM) 50 MG tablet Take 50 mg by mouth every 6 (six) hours as needed.          Review of Systems System review is negative for any constitutional, cardiac, pulmonary, GI or neuro symptoms or complaints other than as described in the HPI.     Objective:  Physical Exam Filed Vitals:   03/27/12 1553  BP: 110/72  Pulse: 77  Temp: 97.8 F (36.6 C)  Resp: 12   Gen'l- WNWD overweight AA woman in no distress HEENT_ TMs normal, no sinus tenderness to percussion Pulm - normal respirations, lungs are clear Cor - RRR Neuro - A&O x 3       Assessment & Plan:  Your symptoms still look like a viral upper respiratory infection with little evidence of bacterial infection.  Plan  Lab: will check a complete blood count. If the white count is up a Rx for an antibiotic will be sent in  Continue all the supportive care that you have been doing.  Resume your nasonex spray.  Addendum: CBC with normal WBC and diff

## 2012-03-27 NOTE — Patient Instructions (Addendum)
Your symptoms still look like a viral upper respiratory infection with little evidence of bacterial infection.  Plan  Lab: will check a complete blood count. If the white count is up a Rx for an antibiotic will be sent in  Continue all the supportive care that you have been doing.  Resume your nasonex spray.  Diabetes - last A1c was April '13 = 6.6%  Plan  continue being a good girl in regard to your diet  A1C today with recommendations to follow

## 2012-03-30 ENCOUNTER — Encounter: Payer: Self-pay | Admitting: Internal Medicine

## 2012-03-30 NOTE — Assessment & Plan Note (Signed)
A1C at 6.5% - remains adequately conrolled.

## 2012-03-31 ENCOUNTER — Telehealth: Payer: Self-pay | Admitting: Internal Medicine

## 2012-03-31 NOTE — Telephone Encounter (Signed)
Requesting a call about lab results.

## 2012-03-31 NOTE — Telephone Encounter (Signed)
Pt called requesting lab results. Please advise.  

## 2012-04-01 ENCOUNTER — Encounter (HOSPITAL_COMMUNITY): Payer: Medicare PPO

## 2012-04-01 ENCOUNTER — Telehealth: Payer: Self-pay | Admitting: *Deleted

## 2012-04-01 NOTE — Telephone Encounter (Signed)
Letter with result went out 1/13. All her labs were normal with A1C 6.5%. She can get results more quickly if she will activate her MyChart account

## 2012-04-01 NOTE — Telephone Encounter (Signed)
Called pt and and told her that a letter with result went out 1/13. All her labs were normal with A1C 6.5%. Pt states she is set up on MyChart, but did not see any results yesterday. Pt happy with results.

## 2012-04-03 ENCOUNTER — Encounter (HOSPITAL_COMMUNITY)
Admission: RE | Admit: 2012-04-03 | Discharge: 2012-04-03 | Disposition: A | Discharge status: Home / Self Care | Payer: Medicare PPO | Source: Ambulatory Visit | Attending: Internal Medicine | Admitting: Internal Medicine

## 2012-04-03 DIAGNOSIS — R0989 Other specified symptoms and signs involving the circulatory and respiratory systems: Secondary | ICD-10-CM | POA: Insufficient documentation

## 2012-04-03 DIAGNOSIS — R0609 Other forms of dyspnea: Secondary | ICD-10-CM | POA: Insufficient documentation

## 2012-04-03 DIAGNOSIS — Z5189 Encounter for other specified aftercare: Secondary | ICD-10-CM | POA: Insufficient documentation

## 2012-04-04 NOTE — Telephone Encounter (Signed)
Patient is aware 

## 2012-04-08 ENCOUNTER — Encounter (HOSPITAL_COMMUNITY)
Admission: RE | Admit: 2012-04-08 | Discharge: 2012-04-08 | Disposition: A | Discharge status: Home / Self Care | Payer: Medicare PPO | Source: Ambulatory Visit | Attending: Internal Medicine | Admitting: Internal Medicine

## 2012-04-08 ENCOUNTER — Telehealth: Payer: Self-pay | Admitting: Internal Medicine

## 2012-04-08 NOTE — Telephone Encounter (Signed)
Called to request prescription for Indapamide 1.25 mg, sig: 1 po BID be called to [facility].  Normally gets mail order medications but this one did not come in December 2013 when all other medications were filled; caller suspects refills expired.  Prefers 30 day supply until she can bring refill paperwork for [employer] order pharmacy to the office.

## 2012-04-09 ENCOUNTER — Other Ambulatory Visit: Payer: Self-pay | Admitting: *Deleted

## 2012-04-09 MED ORDER — INDAPAMIDE 1.25 MG PO TABS: 2.5000 mg | ORAL_TABLET | ORAL | Status: DC

## 2012-04-10 ENCOUNTER — Encounter (HOSPITAL_COMMUNITY)
Admission: RE | Admit: 2012-04-10 | Discharge: 2012-04-10 | Disposition: A | Discharge status: Home / Self Care | Payer: Medicare PPO | Source: Ambulatory Visit | Attending: Internal Medicine | Admitting: Internal Medicine

## 2012-04-15 ENCOUNTER — Encounter (HOSPITAL_COMMUNITY)
Admission: RE | Admit: 2012-04-15 | Discharge: 2012-04-15 | Disposition: A | Discharge status: Home / Self Care | Payer: Medicare PPO | Source: Ambulatory Visit | Attending: Internal Medicine | Admitting: Internal Medicine

## 2012-04-17 ENCOUNTER — Encounter (HOSPITAL_COMMUNITY): Admission: RE | Admit: 2012-04-17 | Payer: Medicare PPO | Source: Ambulatory Visit

## 2012-04-22 ENCOUNTER — Encounter (HOSPITAL_COMMUNITY)
Admission: RE | Admit: 2012-04-22 | Discharge: 2012-04-22 | Disposition: A | Discharge status: Home / Self Care | Payer: Medicare PPO | Source: Ambulatory Visit | Attending: Internal Medicine | Admitting: Internal Medicine

## 2012-04-22 DIAGNOSIS — R0989 Other specified symptoms and signs involving the circulatory and respiratory systems: Secondary | ICD-10-CM | POA: Insufficient documentation

## 2012-04-22 DIAGNOSIS — Z5189 Encounter for other specified aftercare: Secondary | ICD-10-CM | POA: Insufficient documentation

## 2012-04-22 DIAGNOSIS — R0609 Other forms of dyspnea: Secondary | ICD-10-CM | POA: Insufficient documentation

## 2012-04-22 NOTE — Progress Notes (Addendum)
Pari Bonczek 72 y.o. female Nutrition Note  Time: 90 minutes Spoke with pt. Pt is obese. History of binge eating disorder and treatment. Pt feels she eats out of habit now. Per discussion, pt follows a "diet" better if she sticks with a meal plan. Meal plan discussed. Pt to add more protein (e.g. Legumes) and fresh fruit to lunch. Pt eats 3 meals a day; most prepared at home. Pt reports 9 pm is the time of day she has the most difficulty with binge eating. Ideas for purposeful eating/trying to prevent binge eating discussed. Pt expressed understanding. Pt making healthy food choices the majority of the time.  Pt's Rate Your Plate results reviewed with pt.  Pt expressed understanding. Pt reports she tries to avoid salty food; uses frozen veggies to help decrease sodium intake. Pt does not add salt to food. The role of sodium in lung disease reviewed with pt. Pt is a diet-controlled diabetic. Last A1c indicates blood glucose well-controlled.  24 hour food recall Breakfast #1 Smoothie made with Weight Watcher's powder, 1 cup frozen berries, Greek yogurt, and "green" powder Breakfast #2 Instant Oatmeal made with almond milk and fresh apple and [place] yogurt or egg  [employer] with salad greens, veggies, and 2 slices deli [place] Low-fat Honey Mustard  Dinner 4 ounce [place] burger made with ground [place] breast Multi-grain English muffin 2% cheese Salad  Nutrition Diagnosis   Food-and nutrition-related knowledge deficit related to lack of exposure to information as related to diagnosis of pulmonary disease   Obesity related to excessive energy intake as evidenced by a BMI of 37.8  Nutrition Rx/Est. Daily Nutrition Needs for: ? wt loss 1200-1350 Kcal 65-80 gm protein   1500 mg or less sodium       Nutrition Intervention   Pt's individual nutrition plan and goals reviewed with pt.   Benefits of adopting healthy eating habits discussed when pt's Rate Your Plate reviewed.   Handouts given for  1200 kcal, 5 day menu ideas   Pt to attend the Nutrition and Lung Disease class   Continual client-centered nutrition education by RD, as part of interdisciplinary care. Goal(s) 1. Identify food quantities necessary to achieve wt loss of  -2# per week to a goal wt of 71.2-79.1 kg (156-174 lb) at graduation from pulmonary rehab. 2. Describe the benefit of including fruits, vegetables, whole grains, and low-fat dairy products in a healthy meal plan. Monitor and Evaluate progress toward nutrition goal with team.

## 2012-04-24 ENCOUNTER — Encounter (HOSPITAL_COMMUNITY)
Admission: RE | Admit: 2012-04-24 | Discharge: 2012-04-24 | Disposition: A | Discharge status: Home / Self Care | Payer: Medicare PPO | Source: Ambulatory Visit | Attending: Internal Medicine | Admitting: Internal Medicine

## 2012-04-29 ENCOUNTER — Encounter (HOSPITAL_COMMUNITY)
Admission: RE | Admit: 2012-04-29 | Discharge: 2012-04-29 | Disposition: A | Discharge status: Home / Self Care | Payer: Medicare PPO | Source: Ambulatory Visit | Attending: Internal Medicine | Admitting: Internal Medicine

## 2012-05-01 ENCOUNTER — Encounter (HOSPITAL_COMMUNITY): Payer: Medicare PPO

## 2012-05-06 ENCOUNTER — Encounter (HOSPITAL_COMMUNITY)
Admission: RE | Admit: 2012-05-06 | Discharge: 2012-05-06 | Disposition: A | Discharge status: Home / Self Care | Payer: Medicare PPO | Source: Ambulatory Visit | Attending: Internal Medicine | Admitting: Internal Medicine

## 2012-05-06 NOTE — Progress Notes (Signed)
Patient reports having a sinus headache for the past 3 days HR 84.  Blood pressure 110/84.  Sa02 99% on room air.  Advised the patient not to exercise today.  [name] said she will follow up with Dr Debby Bud if her headache does not improve.

## 2012-05-08 ENCOUNTER — Ambulatory Visit (INDEPENDENT_AMBULATORY_CARE_PROVIDER_SITE_OTHER): Payer: Medicare PPO | Admitting: Ophthalmology

## 2012-05-08 ENCOUNTER — Encounter (HOSPITAL_COMMUNITY)
Admission: RE | Admit: 2012-05-08 | Discharge: 2012-05-08 | Disposition: A | Discharge status: Home / Self Care | Payer: Medicare PPO | Source: Ambulatory Visit | Attending: Internal Medicine | Admitting: Internal Medicine

## 2012-05-08 DIAGNOSIS — H35039 Hypertensive retinopathy, unspecified eye: Secondary | ICD-10-CM

## 2012-05-08 DIAGNOSIS — H43819 Vitreous degeneration, unspecified eye: Secondary | ICD-10-CM

## 2012-05-08 DIAGNOSIS — I10 Essential (primary) hypertension: Secondary | ICD-10-CM

## 2012-05-08 DIAGNOSIS — H3530 Unspecified macular degeneration: Secondary | ICD-10-CM

## 2012-05-08 DIAGNOSIS — H35379 Puckering of macula, unspecified eye: Secondary | ICD-10-CM

## 2012-05-09 ENCOUNTER — Ambulatory Visit (INDEPENDENT_AMBULATORY_CARE_PROVIDER_SITE_OTHER): Payer: Medicare PPO | Admitting: Ophthalmology

## 2012-05-13 ENCOUNTER — Encounter (HOSPITAL_COMMUNITY)
Admission: RE | Admit: 2012-05-13 | Discharge: 2012-05-13 | Disposition: A | Discharge status: Home / Self Care | Payer: Medicare PPO | Source: Ambulatory Visit | Attending: Internal Medicine | Admitting: Internal Medicine

## 2012-05-15 ENCOUNTER — Encounter (HOSPITAL_COMMUNITY)
Admission: RE | Admit: 2012-05-15 | Discharge: 2012-05-15 | Disposition: A | Discharge status: Home / Self Care | Payer: Medicare PPO | Source: Ambulatory Visit | Attending: Internal Medicine | Admitting: Internal Medicine

## 2012-05-15 NOTE — Progress Notes (Signed)
[name] 72 y.o. female Nutrition Note  Time: 60 minutes Spoke with pt. Pt wt 81.2 kg. Pt has maintained her wt over the past 3 weeks. Previously discussed meal plan to add more protein (e.g. Legumes) and fresh fruit reviewed. Pt states she recently stopped eating legumes "because they have carbs." Sources of carbs discussed. This writer helped to clarify healthy carbs vs unhealthy carbs. Pt encouraged to eat 45-60 grams of carbs at each meal and no less than 130 grams of carbs daily. Pt expressed understanding. Dietary sources of protein and recommended protein intake per meal and daily reviewed. Pt expressed understanding of the information reviewed. Wt Readings from Last 3 Encounters:  03/27/12 179 lb 1.3 oz (81.23 kg)  03/20/12 180 lb (81.647 kg)  11/13/11 180 lb (81.647 kg)    Nutrition Diagnosis   Food-and nutrition-related knowledge deficit related to lack of exposure to information as related to diagnosis of pulmonary disease   Obesity related to excessive energy intake as evidenced by a BMI of 37.8  Nutrition Rx/Est. Daily Nutrition Needs for: ? wt loss 1200-1350 Kcal 65-80 gm protein   1500 mg or less sodium       Nutrition Intervention   Pt's individual nutrition plan and goals reviewed with pt.   Handouts given for 1200 kcal, 5 day menu ideas and Carbohydrate Counting   Pt to attend the Nutrition and Lung Disease class   Continual client-centered nutrition education by RD, as part of interdisciplinary care. Goal(s) 1. Identify food quantities necessary to achieve wt loss of  -2# per week to a goal wt of 71.2-79.1 kg (156-174 lb) at graduation from pulmonary rehab. 2. Describe the benefit of including fruits, vegetables, whole grains, and low-fat dairy products in a healthy meal plan. Monitor and Evaluate progress toward nutrition goal with team.

## 2012-05-20 ENCOUNTER — Encounter (HOSPITAL_COMMUNITY): Payer: Medicare PPO

## 2012-05-22 ENCOUNTER — Encounter (HOSPITAL_COMMUNITY)
Admission: RE | Admit: 2012-05-22 | Discharge: 2012-05-22 | Disposition: A | Discharge status: Home / Self Care | Payer: Medicare PPO | Source: Ambulatory Visit | Attending: Internal Medicine | Admitting: Internal Medicine

## 2012-05-22 DIAGNOSIS — Z5189 Encounter for other specified aftercare: Secondary | ICD-10-CM | POA: Insufficient documentation

## 2012-05-22 DIAGNOSIS — R0989 Other specified symptoms and signs involving the circulatory and respiratory systems: Secondary | ICD-10-CM | POA: Insufficient documentation

## 2012-05-22 DIAGNOSIS — R0609 Other forms of dyspnea: Secondary | ICD-10-CM | POA: Insufficient documentation

## 2012-05-27 ENCOUNTER — Encounter (HOSPITAL_COMMUNITY)
Admission: RE | Admit: 2012-05-27 | Discharge: 2012-05-27 | Disposition: A | Discharge status: Home / Self Care | Payer: Medicare PPO | Source: Ambulatory Visit | Attending: Internal Medicine | Admitting: Internal Medicine

## 2012-05-29 ENCOUNTER — Other Ambulatory Visit: Payer: Self-pay | Admitting: Internal Medicine

## 2012-05-29 ENCOUNTER — Encounter (HOSPITAL_COMMUNITY)
Admission: RE | Admit: 2012-05-29 | Discharge: 2012-05-29 | Disposition: A | Discharge status: Home / Self Care | Payer: Medicare PPO | Source: Ambulatory Visit | Attending: Internal Medicine | Admitting: Internal Medicine

## 2012-06-03 ENCOUNTER — Encounter (HOSPITAL_COMMUNITY): Payer: Medicare PPO

## 2012-06-05 ENCOUNTER — Encounter (HOSPITAL_COMMUNITY)
Admission: RE | Admit: 2012-06-05 | Discharge: 2012-06-05 | Disposition: A | Discharge status: Home / Self Care | Payer: Medicare PPO | Source: Ambulatory Visit | Attending: Internal Medicine | Admitting: Internal Medicine

## 2012-06-10 ENCOUNTER — Encounter (HOSPITAL_COMMUNITY)
Admission: RE | Admit: 2012-06-10 | Discharge: 2012-06-10 | Disposition: A | Discharge status: Home / Self Care | Payer: Medicare PPO | Source: Ambulatory Visit | Attending: Internal Medicine | Admitting: Internal Medicine

## 2012-06-10 ENCOUNTER — Ambulatory Visit (INDEPENDENT_AMBULATORY_CARE_PROVIDER_SITE_OTHER): Payer: Medicare PPO | Admitting: Internal Medicine

## 2012-06-10 ENCOUNTER — Encounter: Payer: Self-pay | Admitting: Internal Medicine

## 2012-06-10 VITALS — BP 120/70 | HR 73 | Temp 97.4°F | Resp 16 | Ht 59.0 in | Wt 175.0 lb

## 2012-06-10 DIAGNOSIS — F411 Generalized anxiety disorder: Secondary | ICD-10-CM

## 2012-06-10 DIAGNOSIS — R4182 Altered mental status, unspecified: Secondary | ICD-10-CM

## 2012-06-10 DIAGNOSIS — K219 Gastro-esophageal reflux disease without esophagitis: Secondary | ICD-10-CM

## 2012-06-10 DIAGNOSIS — R1310 Dysphagia, unspecified: Secondary | ICD-10-CM

## 2012-06-10 MED ORDER — RED YEAST RICE 600 MG PO CAPS: 1.0000 | ORAL_CAPSULE | Freq: Every day | ORAL | Status: DC

## 2012-06-10 NOTE — Progress Notes (Signed)
Subjective:    Patient ID: [name], female    DOB: 13-Aug-1940, 72 y.o.   MRN: [id number]  HPI Presents for evaluation of mental status changes: she will stop mid-conversation and either be silent or wander off and does not pick up the conversation. She is aware that her memory isn't right: will forget why she goes into a room. She is excessively fatigued. She does have some stress and worry but has remained active with pulmonary rehab.  Past Medical History  Diagnosis Date  . Hypertension   . Hypothyroidism   . Sleep apnea     Dr Shelle Iron CPAP 2009  . Depression   . Diabetes mellitus type II     diet  . Allergic rhinitis   . Anxiety   . Chronic cough     Dr Sherene Sires, onset 11/2007, Sinus CT July 7,2010>neg, Resolved 10/2008 on ppi two times a day  . Obesity (BMI 30-39.9)   . Pneumonia   . Hiatal hernia   . GERD (gastroesophageal reflux disease)   . Dyslipidemia   . Lung nodule   . Diverticulosis   . Colon polyp   . Arthritis    Past Surgical History  Procedure Laterality Date  . Left breast lumpectomy    . Tonsillectomy      x 2  . Cholecystectomy      1998 in [place]  . Rectocele repair    . Bladder suspension    . Ventral hernia repair    . Appendectomy    . Abdominal hysterectomy    . Nissen fundoplication      March '11 Dr Michaell Cowing  . Eye surgery  10/13    and 12/13; cataract surgery   Family History  Problem Relation Age of Onset  . Stroke Mother 6  . Lung cancer Father    History   Social History  . Marital Status: Single    Spouse Name: N/A    Number of Children: N/A  . Years of Education: N/A   Occupational History  . reited Engineer, petroleum    Social History Main Topics  . Smoking status: Former Smoker -- 1.50 packs/day for 25 years    Types: Cigarettes    Quit date: 10/28/1981  . Smokeless tobacco: Never Used  . Alcohol Use: No  . Drug Use: No  . Sexually Active: Not Currently   Other Topics Concern  . Not on file   Social History  Narrative   HSG. Married -divorced. 2 sons. 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 Patient does not get regular exercise. Loves to read. Strong faith. Lives alone.    Current Outpatient Prescriptions on File Prior to Visit  Medication Sig Dispense Refill  . aspirin 81 MG tablet Take 81 mg by mouth daily.      . Calcium Carbonate-Vitamin D (CALCIUM 600+D) 600-200 MG-UNIT TABS Take by mouth daily.        . Cinnamon 500 MG TABS Take by mouth daily.        Marland Kitchen diltiazem (DILT-CD) 180 MG 24 hr capsule Take 1 capsule (180 mg total) by mouth every evening.  90 capsule  3  . FLUoxetine (PROZAC) 20 MG capsule Take 1 capsule (20 mg total) by mouth 2 (two) times daily.  60 capsule  1  . Ginkgo Biloba 40 MG TABS Take by mouth daily.        . indapamide (LOZOL) 1.25 MG tablet Take 2 tablets (2.5 mg total) by mouth every morning.  30 tablet  1  . levothyroxine (SYNTHROID) 75 MCG tablet Take 1 tablet (75 mcg total) by mouth daily.  90 tablet  3  . losartan (COZAAR) 100 MG tablet Take 1 tablet (100 mg total) by mouth daily.  90 tablet  3  . Multiple Vitamins-Minerals (CENTRUM SILVER PO) Take by mouth daily.        . ONE TOUCH LANCETS MISC Use as directed twice daily to test blood glucose levels.  200 each  11  . ONE TOUCH LANCETS MISC Use with One Touch Verio monitor as directed Twice Daily to test blood glucose levels. DX: 250.60  200 each  5  . ONETOUCH VERIO test strip USE AS DIRECTED  100 each  1  . potassium chloride SA (KLOR-CON M20) 20 MEQ tablet Take 1 tablet (20 mEq total) by mouth daily.  90 tablet  3  . Probiotic Product (PROBIOTIC & ACIDOPHILUS EX ST PO) Take 1 tablet by mouth daily.       No current facility-administered medications on file prior to visit.      Review of Systems System review is negative for any constitutional, cardiac, pulmonary, GI or neuro symptoms or complaints other than as described in the HPI.     Objective:   Physical Exam Filed Vitals:   06/10/12  1358  BP: 120/70  Pulse: 73  Temp: 97.4 F (36.3 C)  Resp: 16   Wt Readings from Last 3 Encounters:  06/10/12 175 lb (79.379 kg)  03/27/12 179 lb 1.3 oz (81.23 kg)  03/20/12 180 lb (81.647 kg)   Gen'l- WNWD AA woman who is upset, emotional HEENT_ C&S clear Cor- 2+ radial pulse Pulm - normal respirations MMSE: 1. Day,date,year - ok, ok, ok 2. Content: president-  ok  Gov. -  No, Senator - no. Current events - reasonable ([place], lost plane) 3. Number repitition: 5 fwd - ok   5 rev - ok    World reversed - ok (slow) 4. 3 word recall - 2/3 5. Serial 7's - ok     , nickles in $1.25-   Ok. Change making - ok 6. Naming objects -    ok       4 legged creatures - ok ( a little slow) 7. Parables:  [place] -  ok     Rolling stone -  Elaborate explanation - off target. 8. Judgement:  Letter    ok      Fire 9. Clock face exercise - ok but deliberate        Assessment & Plan:   Mental status change with difficulty with tracking conversation/thoughts. You had a normal mini mental status exam. Your labs in January were normal. Plan  reduce Prozac to once a day.  For continued problems the next step will be a neurology evaluation. If that is not fruitful .Marland KitchenMarland KitchenMarland KitchenMarland KitchenPyschology evaluation

## 2012-06-10 NOTE — Patient Instructions (Addendum)
1. Mental status change with difficulty with tracking conversation/thoughts. You had a normal mini mental status exam. Your labs in January were normal. Plan  reduce Prozac to once a day.  For continued problems the next step will be a neurology evaluation. If that is not fruitful .Marland KitchenMarland KitchenMarland KitchenMarland KitchenPyschology evaluation  You are otherwise doing well. You are making serious life changing decisions and you are preoccupied, fearfull and worried. No evidence of dementia, gross neurologic disorder.

## 2012-06-11 ENCOUNTER — Other Ambulatory Visit: Payer: Self-pay | Admitting: *Deleted

## 2012-06-11 ENCOUNTER — Telehealth: Payer: Self-pay

## 2012-06-11 MED ORDER — POTASSIUM CHLORIDE CRYS ER 20 MEQ PO TBCR: 20.0000 meq | EXTENDED_RELEASE_TABLET | Freq: Every day | ORAL | Status: DC

## 2012-06-11 MED ORDER — DILTIAZEM HCL ER COATED BEADS 180 MG PO CP24: 180.0000 mg | ORAL_CAPSULE | Freq: Every evening | ORAL | Status: DC

## 2012-06-11 MED ORDER — INDAPAMIDE 1.25 MG PO TABS: 2.5000 mg | ORAL_TABLET | ORAL | Status: DC

## 2012-06-11 MED ORDER — LOSARTAN POTASSIUM 100 MG PO TABS: 100.0000 mg | ORAL_TABLET | Freq: Every day | ORAL | Status: DC

## 2012-06-11 MED ORDER — GLUCOSE BLOOD VI STRP: ORAL_STRIP | Status: DC

## 2012-06-11 MED ORDER — LEVOTHYROXINE SODIUM 75 MCG PO TABS: 75.0000 ug | ORAL_TABLET | Freq: Every day | ORAL | Status: DC

## 2012-06-11 NOTE — Telephone Encounter (Signed)
Rx's sent to [facility].

## 2012-06-11 NOTE — Assessment & Plan Note (Signed)
See note on mental status change in progress note for today's visit - her symptoms may be anxiety driven.

## 2012-06-11 NOTE — Telephone Encounter (Signed)
I called pt to let her know her test strips script is available at our front desk so she can take it to [facility].

## 2012-06-12 ENCOUNTER — Encounter (HOSPITAL_COMMUNITY): Payer: Medicare PPO

## 2012-06-17 ENCOUNTER — Encounter (HOSPITAL_COMMUNITY): Payer: Medicare PPO

## 2012-06-20 ENCOUNTER — Other Ambulatory Visit: Payer: Self-pay | Admitting: Internal Medicine

## 2012-06-20 MED ORDER — INDAPAMIDE 1.25 MG PO TABS: 2.5000 mg | ORAL_TABLET | ORAL | Status: DC

## 2012-09-08 ENCOUNTER — Other Ambulatory Visit: Payer: Self-pay | Admitting: Internal Medicine

## 2012-09-20 ENCOUNTER — Ambulatory Visit (INDEPENDENT_AMBULATORY_CARE_PROVIDER_SITE_OTHER): Payer: Medicare PPO | Admitting: Family Medicine

## 2012-09-20 ENCOUNTER — Encounter: Payer: Self-pay | Admitting: Family Medicine

## 2012-09-20 VITALS — BP 120/80 | HR 77 | Temp 98.0°F | Wt 171.0 lb

## 2012-09-20 DIAGNOSIS — J209 Acute bronchitis, unspecified: Secondary | ICD-10-CM

## 2012-09-20 DIAGNOSIS — J019 Acute sinusitis, unspecified: Secondary | ICD-10-CM

## 2012-09-20 MED ORDER — BENZONATATE 100 MG PO CAPS: 100.0000 mg | ORAL_CAPSULE | Freq: Three times a day (TID) | ORAL | Status: DC | PRN

## 2012-09-20 MED ORDER — ERYTHROMYCIN BASE 500 MG PO TABS: 500.0000 mg | ORAL_TABLET | Freq: Four times a day (QID) | ORAL | Status: DC

## 2012-09-20 NOTE — Progress Notes (Signed)
  Subjective:     [name] is a 72 y.o. female here for evaluation of a cough. Onset of symptoms was 1 week ago. Symptoms have been gradually worsening since that time. The cough is productive and is aggravated by infection and reclining position. Associated symptoms include: shortness of breath, sputum production and sinus congestion / headache.. Patient does not have a history of asthma. Patient does not have a history of environmental allergens. Patient has not traveled recently. Patient does not have a history of smoking. Patient has not had a previous chest x-ray. Patient has not had a PPD done.  The following portions of the patient's history were reviewed and updated as appropriate: allergies, current medications, past family history, past medical history, past social history, past surgical history and problem list.  Review of Systems Pertinent items are noted in HPI.    Objective:    Oxygen saturation 97% on room air BP 120/80  Pulse 77  Temp(Src) 98 F (36.7 C) (Oral)  Wt 171 lb (77.565 kg)  BMI 34.52 kg/m2  SpO2 97% General appearance: alert, cooperative and mild distress Ears: normal TM's and external ear canals both ears Nose: green discharge, moderate congestion, turbinates red, swollen, sinus tenderness left Throat: lips, mucosa, and tongue normal; teeth and gums normal Neck: no adenopathy, supple, symmetrical, trachea midline and thyroid not enlarged, symmetric, no tenderness/mass/nodules Lungs: diminished breath sounds bilaterally and wheezes bilaterally Heart: S1, S2 normal    Assessment:    Acute Bronchitis and Sinusitis    Plan:    Antibiotics per medication orders. Antitussives per medication orders. Avoid exposure to tobacco smoke and fumes. Call if shortness of breath worsens, blood in sputum, change in character of cough, development of fever or chills, inability to maintain nutrition and hydration. Avoid exposure to tobacco smoke and fumes. Trial of  steroid nasal spray.

## 2012-09-20 NOTE — Patient Instructions (Signed)

## 2012-09-23 ENCOUNTER — Ambulatory Visit (INDEPENDENT_AMBULATORY_CARE_PROVIDER_SITE_OTHER)
Admission: RE | Admit: 2012-09-23 | Discharge: 2012-09-23 | Disposition: A | Discharge status: Home / Self Care | Payer: Medicare PPO | Source: Ambulatory Visit | Attending: Internal Medicine | Admitting: Internal Medicine

## 2012-09-23 ENCOUNTER — Ambulatory Visit (INDEPENDENT_AMBULATORY_CARE_PROVIDER_SITE_OTHER): Payer: Medicare HMO | Admitting: Internal Medicine

## 2012-09-23 ENCOUNTER — Encounter: Payer: Self-pay | Admitting: Internal Medicine

## 2012-09-23 VITALS — BP 124/74 | HR 72 | Temp 98.0°F

## 2012-09-23 DIAGNOSIS — J209 Acute bronchitis, unspecified: Secondary | ICD-10-CM

## 2012-09-23 DIAGNOSIS — R05 Cough: Secondary | ICD-10-CM

## 2012-09-23 DIAGNOSIS — R059 Cough, unspecified: Secondary | ICD-10-CM

## 2012-09-23 MED ORDER — METHYLPREDNISOLONE ACETATE 80 MG/ML IJ SUSP
80.0000 mg | Freq: Once | INTRAMUSCULAR | Status: AC
  Administered 2012-09-23: 80 mg via INTRAMUSCULAR

## 2012-09-23 NOTE — Addendum Note (Signed)
Addended by: Brenton Grills C on: 09/23/2012 11:52 AM   Modules accepted: Orders

## 2012-09-23 NOTE — Patient Instructions (Signed)

## 2012-09-23 NOTE — Progress Notes (Signed)
HPI  Pt presents to the clinic today with c/o cold symptoms x 2 weeks. The worst part is the sore throat and cough. She does produce green sputum. She was seen on 7/5 for the same, given doxy for bronchitis. She reports that she is not any better. She does not think the antibiotic is helping. She is using Nyquil at night but it makes her too drowsy during the day. She does have a history of allergies. She has not had sick contacts that she is aware of.  Review of Systems      Past Medical History  Diagnosis Date  . Hypertension   . Hypothyroidism   . Sleep apnea     Dr Shelle Iron CPAP 2009  . Depression   . Diabetes mellitus type II     diet  . Allergic rhinitis   . Anxiety   . Chronic cough     Dr Sherene Sires, onset 11/2007, Sinus CT July 7,2010>neg, Resolved 10/2008 on ppi two times a day  . Obesity (BMI 30-39.9)   . Pneumonia   . Hiatal hernia   . GERD (gastroesophageal reflux disease)   . Dyslipidemia   . Lung nodule   . Diverticulosis   . Colon polyp   . Arthritis     Family History  Problem Relation Age of Onset  . Stroke Mother 56  . Lung cancer Father     History   Social History  . Marital Status: Single    Spouse Name: N/A    Number of Children: N/A  . Years of Education: N/A   Occupational History  . reited Engineer, petroleum    Social History Main Topics  . Smoking status: Former Smoker -- 1.50 packs/day for 25 years    Types: Cigarettes    Quit date: 10/28/1981  . Smokeless tobacco: Never Used  . Alcohol Use: No  . Drug Use: No  . Sexually Active: Not Currently   Other Topics Concern  . Not on file   Social History Narrative   HSG. Married -divorced. 2 sons. 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 Patient does not get regular exercise. Loves to read. Strong faith. Lives alone.    Allergies  Allergen Reactions  . Amoxicillin Other (See Comments)    GI Issues.  . Codeine Sulfate   . Hydrochlorothiazide   . Iodine   . Iohexol      Code:  HIVES, Desc: PATIENT STATES SHE BREAKS OUT IN HIVES FROM IV DYE 02/27/08/RM, Onset Date: 16109604   . Paroxetine   . Penicillins   . Prednisone      Constitutional: Positive headache, fatigue and fever. Denies abrupt weight changes.  HEENT:  Positive sore throat. Denies eye redness, eye pain, pressure behind the eyes, facial pain, nasal congestion, ear pain, ringing in the ears, wax buildup, runny nose or bloody nose. Respiratory: Positive cough. Denies difficulty breathing or shortness of breath.  Cardiovascular: Denies chest pain, chest tightness, palpitations or swelling in the hands or feet.   No other specific complaints in a complete review of systems (except as listed in HPI above).  Objective:   BP 124/74  Pulse 72  Temp(Src) 98 F (36.7 C) (Oral)  SpO2 95% Wt Readings from Last 3 Encounters:  09/20/12 171 lb (77.565 kg)  06/10/12 175 lb (79.379 kg)  03/27/12 179 lb 1.3 oz (81.23 kg)     General: Appears her stated age, well developed, well nourished in NAD. HEENT: Head: normal shape and size; Eyes:  sclera white, no icterus, conjunctiva pink, PERRLA and EOMs intact; Ears: Tm's gray and intact, normal light reflex; Nose: mucosa pink and moist, septum midline; Throat/Mouth: + PND. Teeth present, mucosa erythematous and moist, no exudate noted, no lesions or ulcerations noted.  Neck: Mild cervical lymphadenopathy. Neck supple, trachea midline. No massses, lumps or thyromegaly present.  Cardiovascular: Normal rate and rhythm. S1,S2 noted.  No murmur, rubs or gallops noted. No JVD or BLE edema. No carotid bruits noted. Pulmonary/Chest: Normal effort and scattered rhonchi. No respiratory distress. No wheezes, rales noted.      Assessment & Plan:  Acute Bronchitis, unresolved:  Get some rest and drink plenty of water Continue Doxy for now Will get chest xray to r/o pna 80 mg Depo IM now  RTC as needed or if symptoms persist.

## 2012-09-24 ENCOUNTER — Telehealth: Payer: Self-pay | Admitting: *Deleted

## 2012-09-24 NOTE — Telephone Encounter (Signed)
Pt called requesting Xray results.  Advised her as per [facility] result note.

## 2012-10-14 ENCOUNTER — Telehealth: Payer: Self-pay | Admitting: Internal Medicine

## 2012-10-14 NOTE — Telephone Encounter (Signed)
Patient advised that she is due for colon 09/2013.  I have updated her health maintenance to reflect that she is not overdue for a colonoscopy

## 2012-10-21 ENCOUNTER — Ambulatory Visit: Payer: Medicare PPO | Admitting: Internal Medicine

## 2012-10-23 ENCOUNTER — Ambulatory Visit (INDEPENDENT_AMBULATORY_CARE_PROVIDER_SITE_OTHER): Payer: Self-pay | Admitting: Ophthalmology

## 2012-10-24 ENCOUNTER — Ambulatory Visit (INDEPENDENT_AMBULATORY_CARE_PROVIDER_SITE_OTHER): Payer: Medicare HMO | Admitting: Ophthalmology

## 2012-10-24 DIAGNOSIS — H35039 Hypertensive retinopathy, unspecified eye: Secondary | ICD-10-CM

## 2012-10-24 DIAGNOSIS — E11319 Type 2 diabetes mellitus with unspecified diabetic retinopathy without macular edema: Secondary | ICD-10-CM

## 2012-10-24 DIAGNOSIS — H3530 Unspecified macular degeneration: Secondary | ICD-10-CM

## 2012-10-24 DIAGNOSIS — E1165 Type 2 diabetes mellitus with hyperglycemia: Secondary | ICD-10-CM

## 2012-10-24 DIAGNOSIS — H43819 Vitreous degeneration, unspecified eye: Secondary | ICD-10-CM

## 2012-10-24 DIAGNOSIS — H35379 Puckering of macula, unspecified eye: Secondary | ICD-10-CM

## 2012-10-24 DIAGNOSIS — I10 Essential (primary) hypertension: Secondary | ICD-10-CM

## 2012-11-07 ENCOUNTER — Ambulatory Visit (INDEPENDENT_AMBULATORY_CARE_PROVIDER_SITE_OTHER): Payer: Medicare PPO | Admitting: Ophthalmology

## 2012-11-11 ENCOUNTER — Ambulatory Visit: Payer: Medicare PPO | Admitting: Internal Medicine

## 2012-11-24 ENCOUNTER — Ambulatory Visit (INDEPENDENT_AMBULATORY_CARE_PROVIDER_SITE_OTHER): Payer: Medicare PPO | Admitting: Internal Medicine

## 2012-11-24 ENCOUNTER — Encounter: Payer: Self-pay | Admitting: Internal Medicine

## 2012-11-24 ENCOUNTER — Ambulatory Visit (INDEPENDENT_AMBULATORY_CARE_PROVIDER_SITE_OTHER): Payer: Medicare PPO

## 2012-11-24 VITALS — BP 118/80 | HR 69 | Temp 98.0°F

## 2012-11-24 DIAGNOSIS — E039 Hypothyroidism, unspecified: Secondary | ICD-10-CM

## 2012-11-24 DIAGNOSIS — E785 Hyperlipidemia, unspecified: Secondary | ICD-10-CM

## 2012-11-24 DIAGNOSIS — I10 Essential (primary) hypertension: Secondary | ICD-10-CM

## 2012-11-24 DIAGNOSIS — E119 Type 2 diabetes mellitus without complications: Secondary | ICD-10-CM

## 2012-11-24 NOTE — Patient Instructions (Addendum)
1. Diabetes  - lab today with recommendations to follow  2. CHolesterol - will check lab today with recommendations to follo  3. General labs including thyroid  All results will be on MyChart  4. Upper respiratory - no evidence of a bacterial infection on todays exam. No indication for antibiotics.  Plan mucinex 1200 mg twice a day  Diabetic cough syrup of choice  Tylenol for discomfort or fever  Nasal saline  Stove top vaporizer - boil water then turn off the heat, put in a dollop of menthalatum and make a towel tent and inhale deeply  Call or send a MyChart message if you have fever, purulent sputum - thick, very bitter tasting material.

## 2012-11-24 NOTE — Progress Notes (Signed)
Subjective:    Patient ID: [name], female    DOB: [birth year], 72 y.o.   MRN: [id number]  HPI [name] presents with a need for follow up labs for DM, lipids, and thyroid disease.  She also reports having a cough but no fever, no shortness, no sputum production, no wheezing, mild sore throat. She is concerned this will progress to bronchitis and requests antibiotics.  Past Medical History  Diagnosis Date  . Hypertension   . Hypothyroidism   . Sleep apnea     Dr Shelle Iron CPAP 2009  . Depression   . Diabetes mellitus type II     diet  . Allergic rhinitis   . Anxiety   . Chronic cough     Dr Sherene Sires, onset 11/2007, Sinus CT July 7,2010>neg, Resolved 10/2008 on ppi two times a day  . Obesity (BMI 30-39.9)   . Pneumonia   . Hiatal hernia   . GERD (gastroesophageal reflux disease)   . Dyslipidemia   . Lung nodule   . Diverticulosis   . Colon polyp   . Arthritis    Past Surgical History  Procedure Laterality Date  . Left breast lumpectomy    . Tonsillectomy      x 2  . Cholecystectomy      1998 in [place]  . Rectocele repair    . Bladder suspension    . Ventral hernia repair    . Appendectomy    . Abdominal hysterectomy    . Nissen fundoplication      March '11 Dr Michaell Cowing  . Eye surgery  10/13    and 12/13; cataract surgery   Family History  Problem Relation Age of Onset  . Stroke Mother 7  . Lung cancer Father    History   Social History  . Marital Status: Single    Spouse Name: N/A    Number of Children: N/A  . Years of Education: N/A   Occupational History  . reited Engineer, petroleum    Social History Main Topics  . Smoking status: Former Smoker -- 1.50 packs/day for 25 years    Types: Cigarettes    Quit date: 10/28/1981  . Smokeless tobacco: Never Used  . Alcohol Use: No  . Drug Use: No  . Sexual Activity: Not Currently   Other Topics Concern  . Not on file   Social History Narrative   HSG. Married -divorced. 2 sons. 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 Patient does not get regular exercise. Loves to read. Strong faith. Lives alone.    Past Medical History  Diagnosis Date  . Hypertension   . Hypothyroidism   . Sleep apnea     Dr Shelle Iron CPAP 2009  . Depression   . Diabetes mellitus type II     diet  . Allergic rhinitis   . Anxiety   . Chronic cough     Dr Sherene Sires, onset 11/2007, Sinus CT July 7,2010>neg, Resolved 10/2008 on ppi two times a day  . Obesity (BMI 30-39.9)   . Pneumonia   . Hiatal hernia   . GERD (gastroesophageal reflux disease)   . Dyslipidemia   . Lung nodule   . Diverticulosis   . Colon polyp   . Arthritis    Past Surgical History  Procedure Laterality Date  . Left breast lumpectomy    . Tonsillectomy      x 2  . Cholecystectomy      1998 in [place]  . Rectocele repair    .  Bladder suspension    . Ventral hernia repair    . Appendectomy    . Abdominal hysterectomy    . Nissen fundoplication      March '11 Dr Michaell Cowing  . Eye surgery  10/13    and 12/13; cataract surgery   Family History  Problem Relation Age of Onset  . Stroke Mother 34  . Lung cancer Father    History   Social History  . Marital Status: Single    Spouse Name: N/A    Number of Children: N/A  . Years of Education: N/A   Occupational History  . reited Engineer, petroleum    Social History Main Topics  . Smoking status: Former Smoker -- 1.50 packs/day for 25 years    Types: Cigarettes    Quit date: 10/28/1981  . Smokeless tobacco: Never Used  . Alcohol Use: No  . Drug Use: No  . Sexual Activity: Not Currently   Other Topics Concern  . Not on file   Social History Narrative   HSG. Married -divorced. 2 sons. 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 Patient does not get regular exercise. Loves to read. Strong faith. Lives alone.   Current Outpatient Prescriptions on File Prior to Visit  Medication Sig Dispense Refill  . aspirin 81 MG tablet Take 81 mg by mouth daily.      .  benzonatate (TESSALON) 100 MG capsule Take 1 capsule (100 mg total) by mouth 3 (three) times daily as needed for cough.  30 capsule  0  . Calcium Carbonate-Vitamin D (CALCIUM 600+D) 600-200 MG-UNIT TABS Take by mouth daily.        . Cinnamon 500 MG TABS Take by mouth daily.        Marland Kitchen diltiazem (DILT-CD) 180 MG 24 hr capsule Take 1 capsule (180 mg total) by mouth every evening.  90 capsule  3  . erythromycin base (E-MYCIN) 500 MG tablet Take 1 tablet (500 mg total) by mouth 4 (four) times daily.  40 tablet  0  . FLUoxetine (PROZAC) 20 MG capsule TAKE 1 CAPSULE AT BEDTIME  90 capsule  3  . Ginkgo Biloba 40 MG TABS Take by mouth daily.        Marland Kitchen glucose blood (ONETOUCH VERIO) test strip Use as instructed.  200 each  1  . indapamide (LOZOL) 1.25 MG tablet Take 2 tablets (2.5 mg total) by mouth every morning.  30 tablet  1  . levothyroxine (SYNTHROID) 75 MCG tablet Take 1 tablet (75 mcg total) by mouth daily.  90 tablet  3  . losartan (COZAAR) 100 MG tablet Take 1 tablet (100 mg total) by mouth daily.  90 tablet  3  . Multiple Vitamins-Minerals (CENTRUM SILVER PO) Take by mouth daily.        . ONE TOUCH LANCETS MISC Use as directed twice daily to test blood glucose levels.  200 each  11  . ONE TOUCH LANCETS MISC Use with One Touch Verio monitor as directed Twice Daily to test blood glucose levels. DX: 250.60  200 each  5  . potassium chloride SA (KLOR-CON M20) 20 MEQ tablet Take 1 tablet (20 mEq total) by mouth daily.  90 tablet  3  . Probiotic Product (PROBIOTIC & ACIDOPHILUS EX ST PO) Take 1 tablet by mouth daily.      . Red Yeast Rice 600 MG CAPS Take 1 capsule (600 mg total) by mouth daily.       No current facility-administered medications on  file prior to visit.      Review of Systems System review is negative for any constitutional, cardiac, pulmonary, GI or neuro symptoms or complaints other than as described in the HPI.     Objective:   Physical Exam Filed Vitals:   11/24/12 1551   BP: 118/80  Pulse: 69  Temp: 98 F (36.7 C)   Wt Readings from Last 3 Encounters:  09/20/12 171 lb (77.565 kg)  06/10/12 175 lb (79.379 kg)  03/27/12 179 lb 1.3 oz (81.23 kg)   Gen'l - Overweight AA woman in no distress HEENT- C&S clear, TM's normal, minimal tenderness to percussion over the right maxillary sinus Pulm - normal respirations, lungs CTAP Cor - 2+ radial pulse, 2+ DP pulse. neuro - awake and alert.         Assessment & Plan:  Viral URI  - no evidence of a bacterial infection on todays exam. No indication for antibiotics.  Plan mucinex 1200 mg twice a day  Diabetic cough syrup of choice  Tylenol for discomfort or fever  Nasal saline  Stove top vaporizer - boil water then turn off the heat, put in a dollop of menthalatum and make a towel tent and inhale deeply  Call or send a MyChart message if you have fever, purulent sputum - thick, very bitter tasting material.

## 2012-11-25 LAB — COMPREHENSIVE METABOLIC PANEL
ALT: 32 U/L (ref 0–35)
AST: 27 U/L (ref 0–37)
Albumin: 3.9 g/dL (ref 3.5–5.2)
Calcium: 9.5 mg/dL (ref 8.4–10.5)
Chloride: 104 mEq/L (ref 96–112)
Potassium: 3.8 mEq/L (ref 3.5–5.1)
Sodium: 140 mEq/L (ref 135–145)

## 2012-11-25 LAB — LDL CHOLESTEROL, DIRECT: Direct LDL: 120.6 mg/dL

## 2012-11-25 LAB — LIPID PANEL: HDL: 84 mg/dL (ref >39.00)

## 2012-11-25 LAB — HEMOGLOBIN A1C: Hgb A1c MFr Bld: 6.5 % (ref 4.6–6.5)

## 2012-11-25 LAB — HEPATIC FUNCTION PANEL: Albumin: 3.9 g/dL (ref 3.5–5.2)

## 2012-11-25 LAB — TSH: TSH: 2.52 u[IU]/mL (ref 0.35–5.50)

## 2012-11-25 NOTE — Assessment & Plan Note (Signed)
For TSH today with recommendations to follow

## 2012-11-25 NOTE — Assessment & Plan Note (Signed)
For follow up lipid panel with recommendations to follow. Goal is LDL <100

## 2012-11-25 NOTE — Assessment & Plan Note (Signed)
Due for follow up A1C with recommendations to follow.  She is current with eye exams.

## 2012-11-27 ENCOUNTER — Other Ambulatory Visit: Payer: Self-pay | Admitting: Internal Medicine

## 2012-12-04 ENCOUNTER — Other Ambulatory Visit: Payer: Self-pay

## 2012-12-04 DIAGNOSIS — Z1231 Encounter for screening mammogram for malignant neoplasm of breast: Secondary | ICD-10-CM

## 2012-12-05 ENCOUNTER — Encounter: Payer: Self-pay | Admitting: *Deleted

## 2012-12-05 ENCOUNTER — Encounter: Payer: Medicare PPO | Attending: Internal Medicine | Admitting: *Deleted

## 2012-12-05 VITALS — Ht 59.5 in | Wt 180.8 lb

## 2012-12-05 DIAGNOSIS — E1149 Type 2 diabetes mellitus with other diabetic neurological complication: Secondary | ICD-10-CM

## 2012-12-05 DIAGNOSIS — E663 Overweight: Secondary | ICD-10-CM | POA: Insufficient documentation

## 2012-12-05 DIAGNOSIS — E119 Type 2 diabetes mellitus without complications: Secondary | ICD-10-CM | POA: Insufficient documentation

## 2012-12-05 DIAGNOSIS — Z713 Dietary counseling and surveillance: Secondary | ICD-10-CM | POA: Insufficient documentation

## 2012-12-05 NOTE — Patient Instructions (Addendum)
Plan:  Continue with Weight Watcher Point Plan Continue recording food intake for accountability Continue reading food labels for Total Carbohydrate and Fat Grams of foods Consider  increasing your activity level by using your treadmill or walking for 10 minutes several times daily as tolerated Continue checking BG at alternate times per day as directed by MD  We will set up appointment with Denny Levy, RD,  for follow up as well.

## 2012-12-05 NOTE — Progress Notes (Signed)
Appt start time: 0930 end time:  1100.  Assessment:  Patient was seen on  12/05/12 for individual diabetes education. I worked with this patient over a year ago (09/06/11) to assist with weight management, however this is the first time she has returned to see me. She again states she is very unhappy with her weight and continues to struggle with ways to control it. She feels she is in control of the other aspects of her life such as her time, money management, friends and her faith, however she feels she sabotages herself in term of food. Her Diabetes appears to be in control with most recent A1c = 6.5% on 11/24/12. She is not on any diabetes medications at this time. She has not been exercising on her treadmill, feels she could do better with that, too. She has worked with [employer] in the past, and still uses their Food [employer] counting points. She states she has recently joined [employer] to try and get help with her binging.  Current HbA1c: 6.5% on 11/24/12  MEDICATIONS: see list. No diabetes medications at this time   DIETARY INTAKE:  Usual eating pattern includes 3 meals and 0-2 snacks per day.  Everyday foods include smoothies, fair varity of all food groups.  Avoided foods include none stated.    24-hr recall: not completed at this visit  Usual physical activity: treadmill or walking outside infrequently  Progress Towards Goal(s):  Modified goal(s).   Nutritional Diagnosis:  NI-1.5 Excessive energy intake As related to activity level.  As evidenced by BMI of 36.0    Intervention:  Nutrition counseling provided.  We spent this visit exploring her ambitions for appropriate food intake and exercise. I offered her to follow up with Denny Levy, RD who has expertise in eating disorders, and she expressed interest in meeting with Vernona Rieger. I acknowledged the benefit of recording her food intake and strongly encouraged her to increase her activity level in whatever way appealed to  her the most. Plan:  Continue with Weight Watcher Point Plan Continue recording food intake for accountability Continue reading food labels for Total Carbohydrate and Fat Grams of foods Consider  increasing your activity level by using your treadmill or walking for 10 minutes several times daily as tolerated Continue checking BG at alternate times per day as directed by MD  We will set up appointment with Denny Levy, RD,  for follow up as well  Handouts given during visit include: no new handouts at this visit. She commented repeatedly that she has "all the books" at home and it is the implementation of behavior change that she needs.  Barriers to learning/adherance to lifestyle change: stated history of binge eating and impaired eating habits.  Diabetes self-care support plan:   [facility] support group available  Monitoring/Evaluation:  Dietary intake, exercise, SMBG, and body weight in 4 week(s).

## 2012-12-22 ENCOUNTER — Telehealth: Payer: Self-pay | Admitting: *Deleted

## 2012-12-22 NOTE — Telephone Encounter (Signed)
Phone call to patient, she states she is asking if there is anyone specific you recommend and then she can call herself.

## 2012-12-22 NOTE — Telephone Encounter (Signed)
Generally don't make referrals to chiropractors - patient may schedule her own appointment

## 2012-12-22 NOTE — Telephone Encounter (Signed)
Sorry I do not have a specific recommendation.

## 2012-12-22 NOTE — Telephone Encounter (Signed)
Pt called requesting a referral to a Chiropractor.  Please advise

## 2012-12-23 NOTE — Telephone Encounter (Signed)
Left message stating MD does not have a specific recommendation.

## 2012-12-31 ENCOUNTER — Ambulatory Visit: Payer: Medicare PPO | Admitting: *Deleted

## 2013-01-07 ENCOUNTER — Encounter: Payer: Medicare HMO | Attending: Internal Medicine | Admitting: *Deleted

## 2013-01-07 DIAGNOSIS — E669 Obesity, unspecified: Secondary | ICD-10-CM

## 2013-01-07 DIAGNOSIS — E119 Type 2 diabetes mellitus without complications: Secondary | ICD-10-CM | POA: Insufficient documentation

## 2013-01-07 DIAGNOSIS — E663 Overweight: Secondary | ICD-10-CM | POA: Insufficient documentation

## 2013-01-07 DIAGNOSIS — E1149 Type 2 diabetes mellitus with other diabetic neurological complication: Secondary | ICD-10-CM

## 2013-01-07 DIAGNOSIS — Z713 Dietary counseling and surveillance: Secondary | ICD-10-CM | POA: Insufficient documentation

## 2013-01-07 NOTE — Patient Instructions (Signed)
Goals:  Put away the scale.  Refrain from weighing self between nutrition appointments Reject diet mentality- there are no good or bad foods Listen to internal hunger cues and honor those cues; don't wait until you're ravenous to eat Choose the food(s) you want Enjoy those foods.  Try to make meal last 20 minutes Stop eating when full.  Honor fullness cues too Do these things without any guilt or regret  

## 2013-01-07 NOTE — Progress Notes (Signed)
  Medical Nutrition Therapy:  Appt start time: 1200 end time:  1300.  Assessment:  Primary concerns today: [name] was referred by [name] for nutrition counseling pertaining to disordered eating.  [name] has well-controlled diabetes.  She has been struggling with binge eating for 47 years.  She has tried every diet and has a lot of diet books and plans and knows what to eat, but implementation has been difficult.  She has been to [employer], but feels that they are not faith-based and doesn't feel like they are helpful.  Went on 28 day retreat for disordered eating back in 1992.  She uses food to suppress her emotional from her divorce and life prior to adoption.  She feels out of control with her eating.  Her sleep pattern is highly irregular: she sleeps from 1am till 2 pm and then takes naps.  She doesn't want to take medication for sleep.  She had a sleep study done 10 years ago.  She is currently not seeing a therapist  MEDICATIONS: see list   DIETARY INTAKE:  Usual eating pattern includes 3 meals and multiple snacks per day.  Everyday foods include proteins, starches, fruits, vegetables.  Avoided foods include flour and sweets  24-hr recall:  B ( AM): oatmeal and fruit with boiled egg or smoothie with fruit, yogurt, and protein powder   Snk ( AM): none  L ( PM): grilled chicken salad Snk ( PM): not usually D ( PM): grilled chicken salad Snk ( PM):  Candy, chips, popcorn, akes, cookies, etc Beverages: water  Usual physical activity: none  Estimated energy needs: 1500 calories 170 g carbohydrates 112 g protein 42 g fat    Nutritional Diagnosis:  [place]-3.3 Overweight/obesity As related to excessive caloric intake.  As evidenced by binge eating disorder.    Intervention:  Not much nutrition education was provided at this visit.  [name] spent most of her appointment talking about her struggles.  I did recommended [name] for mental health counseling.  i also recommended she talk  with her doctor about a new sleep study.  I introduced the concept of Intuitive Eating that we will focus on more next time   Monitoring/Evaluation:  Dietary intake, exercise, and body weight in 1 month(s).

## 2013-01-21 ENCOUNTER — Telehealth: Payer: Self-pay | Admitting: *Deleted

## 2013-01-21 NOTE — Telephone Encounter (Signed)
Pt called requesting referral to Dr Shelle Iron for C Pap machine evaluation.  States her current C Pap is making a loud noise.  Please advise

## 2013-01-21 NOTE — Telephone Encounter (Signed)
If it is purely a mechanical/machine related problem she should call the equipment provider.  If she needs a follow up sleep medicine evaluation and does not want to see her original prescriber we can refer to Dr. Shelle Iron.

## 2013-01-22 ENCOUNTER — Telehealth: Payer: Self-pay | Admitting: *Deleted

## 2013-01-22 ENCOUNTER — Other Ambulatory Visit: Payer: Self-pay

## 2013-01-22 DIAGNOSIS — G4733 Obstructive sleep apnea (adult) (pediatric): Secondary | ICD-10-CM

## 2013-01-22 NOTE — Telephone Encounter (Signed)
Referral request to [facility] 

## 2013-01-22 NOTE — Telephone Encounter (Signed)
Left message for pt to return call.

## 2013-01-22 NOTE — Telephone Encounter (Signed)
Spoke with pt advised referral sent 

## 2013-01-22 NOTE — Telephone Encounter (Signed)
spoke with pt, she does want to be re-evaluated by Dr Shelle Iron.  Please advise

## 2013-02-09 ENCOUNTER — Ambulatory Visit
Admission: RE | Admit: 2013-02-09 | Discharge: 2013-02-09 | Disposition: A | Discharge status: Home / Self Care | Payer: Commercial Managed Care - HMO | Source: Ambulatory Visit

## 2013-02-09 ENCOUNTER — Encounter: Payer: Medicare HMO | Attending: Internal Medicine | Admitting: *Deleted

## 2013-02-09 DIAGNOSIS — Z713 Dietary counseling and surveillance: Secondary | ICD-10-CM | POA: Insufficient documentation

## 2013-02-09 DIAGNOSIS — E119 Type 2 diabetes mellitus without complications: Secondary | ICD-10-CM | POA: Insufficient documentation

## 2013-02-09 DIAGNOSIS — E663 Overweight: Secondary | ICD-10-CM | POA: Insufficient documentation

## 2013-02-09 DIAGNOSIS — Z1231 Encounter for screening mammogram for malignant neoplasm of breast: Secondary | ICD-10-CM

## 2013-02-09 NOTE — Progress Notes (Signed)
  Medical Nutrition Therapy:  Appt start time: 1100 end time:  1130.  Assessment:  Primary concerns today: [name] was referred by [name] for nutrition counseling pertaining to disordered eating. This is her second visit with me.  She has been seeing [name] for diabetes management.  She bought a copy of Intuitive Eating.  She is able to detect hunger now, but she still isn't sure what to eat.  She still  Struggles with binge eating with dessert.  She does fine with fruits, but it's the dessert (pure sugar) that she struggles with.  She still feels out of control with eating.  She still wants some nutrition  Guidance.  She reports not being as afraid of carbs as she was.   She made an appointment for a sleep study.  She has been working with [name], therapist   MEDICATIONS: see list   DIETARY INTAKE:  Usual eating pattern includes 3 meals and multiple snacks per day.  Everyday foods include proteins, starches, fruits, vegetables.  Avoided foods include flour and sweets  24-hr recall:  B ( AM): oatmeal and fruit with boiled egg or smoothie with fruit, yogurt, and protein powder   Snk ( AM): none  L ( PM): grilled chicken salad Snk ( PM): not usually D ( PM): grilled chicken salad Snk ( PM):  Candy, chips, popcorn, akes, cookies, etc Beverages: water  Usual physical activity: none  Estimated energy needs: 1500 calories 170 g carbohydrates 112 g protein 42 g fat    Nutritional Diagnosis:  [place]-3.3 Overweight/obesity As related to excessive caloric intake.  As evidenced by binge eating disorder.    Intervention: Encouraged patient to reject traditional diet mentality of "good" vs "bad" foods.  There are no good and bad foods, but rather food is fuel that we needs for our bodies.  When we don't get enough fuel, our bodies suffer the metabolic consequences.  Encouraged patient to eat whatever foods will satisfy them, regardless of their nutritional value.  We will discuss  nutritional values of foods at a subsequent appointment.  Encouraged patient to honor their body's internal hunger and fullness cues.  Throughout the day, check in mentally and rate hunger.  Try not to eat when ravenous, but instead when slightly hungry.  Then choose food(s) that will be satisfying regardless of nutritional content.  Sit down to enjoy those foods.  Minimize distractions: turn off tv, put away books, work, Programmer, applications.  Make the meal last at least 20 minutes in order to give time to experience and register satiety.  Stop eating when full regardless of how much food is left on the plate.  Get more if still hungry.  The key is to honor fullness so throughout the meal, rate fullness factor and stop when comfortably full, but not stuffed.  Reminded patient that they can have any food they want, whenever they want, and however much they want.  Eventually the novelty will wear out and each food will be equal in terms of its emotional appeal.  This will be a learning process and some days more food will be eaten, some days less.  The key is to honor hunger and fullness without any feelings of guilt.  Pay attention to what the internal cues are, rather than any external factors.   Monitoring/Evaluation:  Dietary intake, exercise, and body weight in 1 month(s).

## 2013-02-25 ENCOUNTER — Ambulatory Visit (INDEPENDENT_AMBULATORY_CARE_PROVIDER_SITE_OTHER): Payer: Medicare HMO | Admitting: Internal Medicine

## 2013-02-25 ENCOUNTER — Encounter: Payer: Self-pay | Admitting: Internal Medicine

## 2013-02-25 VITALS — BP 110/80 | HR 73 | Temp 97.5°F | Wt 183.0 lb

## 2013-02-25 DIAGNOSIS — M26629 Arthralgia of temporomandibular joint, unspecified side: Secondary | ICD-10-CM

## 2013-02-25 NOTE — Patient Instructions (Signed)
The ear and ear drum look normal. You have tenderness at the TNJ.  Plan Aleve  Aspercreme to the outside of the jaw.  Small bites, no gum     Temporomandibular Problems  Temporomandibular joint (TMJ) dysfunction means there are problems with the joint between your jaw and your skull. This is a joint lined by cartilage like other joints in your body but also has a small disc in the joint which keeps the bones from rubbing on each other. These joints are like other joints and can get inflamed (sore) from arthritis and other problems. When this joint gets sore, it can cause headaches and pain in the jaw and the face. CAUSES  Usually the arthritic types of problems are caused by soreness in the joint. Soreness in the joint can also be caused by overuse. This may come from grinding your teeth. It may also come from mis-alignment in the joint. DIAGNOSIS Diagnosis of this condition can often be made by history and exam. Sometimes your caregiver may need X-rays or an MRI scan to determine the exact cause. It may be necessary to see your dentist to determine if your teeth and jaws are lined up correctly. TREATMENT  Most of the time this problem is not serious; however, sometimes it can persist (become chronic). When this happens medications that will cut down on inflammation (soreness) help. Sometimes a shot of cortisone into the joint will be helpful. If your teeth are not aligned it may help for your dentist to make a splint for your mouth that can help this problem. If no physical problems can be found, the problem may come from tension. If tension is found to be the cause, biofeedback or relaxation techniques may be helpful. HOME CARE INSTRUCTIONS   Later in the day, applications of ice packs may be helpful. Ice can be used in a plastic bag with a towel around it to prevent frostbite to skin. This may be used about every 2 hours for 20 to 30 minutes, as needed while awake, or as directed by your  caregiver.  Only take over-the-counter or prescription medicines for pain, discomfort, or fever as directed by your caregiver.  If physical therapy was prescribed, follow your caregiver's directions.  Wear mouth appliances as directed if they were given. Document Released: 11/28/2000 Document Revised: 05/28/2011 Document Reviewed: 03/07/2008 [facility] Patient Information 2014 [place], [place].

## 2013-02-25 NOTE — Progress Notes (Signed)
Pre visit review using our clinic review tool, if applicable. No additional management support is needed unless otherwise documented below in the visit note. 

## 2013-02-26 DIAGNOSIS — M26629 Arthralgia of temporomandibular joint, unspecified side: Secondary | ICD-10-CM | POA: Insufficient documentation

## 2013-02-26 NOTE — Assessment & Plan Note (Signed)
Dec '14 - pain at right TMJ. Crepitus and click on exam  Plan Aspercreme of linament of choice over external aspect of joint  Aleve  Small bites  See Dentist re: malocclusion or bruxism

## 2013-03-02 ENCOUNTER — Ambulatory Visit (INDEPENDENT_AMBULATORY_CARE_PROVIDER_SITE_OTHER): Payer: Medicare HMO | Admitting: Pulmonary Disease

## 2013-03-02 ENCOUNTER — Encounter: Payer: Self-pay | Admitting: Pulmonary Disease

## 2013-03-02 VITALS — BP 138/84 | HR 69 | Temp 97.7°F | Ht <= 58 in | Wt 185.4 lb

## 2013-03-02 DIAGNOSIS — G4733 Obstructive sleep apnea (adult) (pediatric): Secondary | ICD-10-CM

## 2013-03-02 NOTE — Assessment & Plan Note (Signed)
The patient has a history of mild obstructive sleep apnea, but has had significant issues over the years tolerating CPAP. She tells me that she did fairly well with the device in 2009 and total it started making noise. She believed that it helped her sleep and her daytime alertness. I discussed with her restarting CPAP, as well as a dental appliance, but she will not be able to have a dental appliance because of her TMJ issues.  She really wants to try CPAP again, and we will reinstitute with a fairly low pressure to start. I also think her poor sleep hygiene and delayed sleep phase syndrome are contributing to her daytime symptoms as well. I have counseled her on how to get back on schedule, and her CPAP machine may actually help her do this.

## 2013-03-02 NOTE — Patient Instructions (Signed)
Will see if we can get you restarted on cpap without a repeat study. Work on getting your weight down. Will see you back in 6 weeks, but call if having tolerance issues.

## 2013-03-02 NOTE — Progress Notes (Signed)
Subjective:    Patient ID: [name], female    DOB: 1940-08-19, 72 y.o.   MRN: [id number]  HPI The patient is a 72 year old female who I've been asked to see for management of obstructive sleep apnea. She was first diagnosed in 2002 with an AHI of 11 events per hour, but was unable to tolerate CPAP. I saw her initially in 2009, where CPAP was restarted. She feels that it helped her for a while, and then she began to have issues with her machine getting louder in total it broke. She has not worn CPAP for a few years. She is unsure if she snores, but denies choking arousals. However, she is unrested in the mornings upon arising during she does go to bed extremely late, and tells me that she has her days and nights mixed up. Unfortunately, she will nap for a few hours during the late mornings. She complains of a lot of sleepiness during the day with inactivity. The patient's weight is fairly stable from the last visit in 2009, and her Epworth score today is 11.   Sleep Questionnaire What time do you typically go to bed?( Between what hours) 12a-3am 12a-3am at 1531 on 03/02/13 by Maisie Fus, CMA How long does it take you to fall asleep? within minutes within minutes at 1531 on 03/02/13 by Maisie Fus, CMA How many times during the night do you wake up? No Value 3-4x at 1531 on 03/02/13 by Maisie Fus, CMA What time do you get out of bed to start your day? No Value 7am for bible study--back to sleep until 12p-2p at 1531 on 03/02/13 by Maisie Fus, CMA Do you drive or operate heavy machinery in your occupation? No No at 1531 on 03/02/13 by Maisie Fus, CMA How much has your weight changed (up or down) over the past two years? (In pounds) 15 lb (6.804 kg) 15 lb (6.804 kg) at 1531 on 03/02/13 by Maisie Fus, CMA Have you ever had a sleep study before? Yes Yes at 1531 on 03/02/13 by Maisie Fus, CMA If yes, location of study? [facility] at 1531 on 03/02/13 by Maisie Fus, CMA If yes, date of study? 2008 2008 at 1531 on 03/02/13 by Maisie Fus, CMA Do you currently use CPAP? No Value noisy--BROKEN at 1531 on 03/02/13 by Maisie Fus, CMA Do you wear oxygen at any time? No No at 1531 on 03/02/13 by Maisie Fus, CMA   Review of Systems  Constitutional: Negative for fever and unexpected weight change.  HENT: Positive for congestion, ear pain ( TMJ) and sinus pressure. Negative for dental problem, nosebleeds, postnasal drip, rhinorrhea, sneezing, sore throat and trouble swallowing.   Eyes: Negative for redness and itching.  Respiratory: Negative for cough, chest tightness, shortness of breath and wheezing.   Cardiovascular: Negative for palpitations and leg swelling.  Gastrointestinal: Negative for nausea and vomiting.       Acid heartburn  Genitourinary: Negative for dysuria.  Musculoskeletal: Negative for joint swelling.  Skin: Negative for rash.  Neurological: Positive for headaches.  Hematological: Does not bruise/bleed easily.  Psychiatric/Behavioral: Positive for dysphoric mood. The patient is not nervous/anxious.        Objective:   Physical Exam Constitutional:  Overweight female, no acute distress  HENT:  Nares patent without discharge  Oropharynx without exudate, palate and uvula are elongated.  Eyes:  Perrla, eomi, no scleral icterus  Neck:  No JVD, no  TMG  Cardiovascular:  Normal rate, regular rhythm, no rubs or gallops.  No murmurs        Intact distal pulses but decreased.  Pulmonary :  Normal breath sounds, no stridor or respiratory distress   No rales, rhonchi, or wheezing  Abdominal:  Soft, nondistended, bowel sounds present.  No tenderness noted.   Musculoskeletal:  mild lower extremity edema noted.  Lymph Nodes:  No cervical lymphadenopathy noted  Skin:  No cyanosis noted  Neurologic:  Alert, appropriate, moves all 4 extremities without obvious deficit.         Assessment & Plan:

## 2013-03-18 ENCOUNTER — Ambulatory Visit (INDEPENDENT_AMBULATORY_CARE_PROVIDER_SITE_OTHER)
Admission: RE | Admit: 2013-03-18 | Discharge: 2013-03-18 | Disposition: A | Discharge status: Home / Self Care | Payer: Medicare HMO | Source: Ambulatory Visit | Attending: Internal Medicine | Admitting: Internal Medicine

## 2013-03-18 ENCOUNTER — Encounter: Payer: Self-pay | Admitting: Internal Medicine

## 2013-03-18 ENCOUNTER — Ambulatory Visit (INDEPENDENT_AMBULATORY_CARE_PROVIDER_SITE_OTHER): Payer: Medicare HMO | Admitting: Internal Medicine

## 2013-03-18 VITALS — BP 104/62 | HR 68 | Temp 99.0°F | Resp 16 | Wt 187.0 lb

## 2013-03-18 DIAGNOSIS — J209 Acute bronchitis, unspecified: Secondary | ICD-10-CM

## 2013-03-18 MED ORDER — BENZONATATE 200 MG PO CAPS: 200.0000 mg | ORAL_CAPSULE | Freq: Three times a day (TID) | ORAL | Status: DC | PRN

## 2013-03-18 MED ORDER — AZITHROMYCIN 250 MG PO TABS: ORAL_TABLET | ORAL | Status: DC

## 2013-03-18 NOTE — Progress Notes (Signed)
Pre visit review using our clinic review tool, if applicable. No additional management support is needed unless otherwise documented below in the visit note. 

## 2013-03-18 NOTE — Patient Instructions (Signed)
Use over-the-counter  "cold" medicines  such as  "Afrin" nasal spray for nasal congestion as directed instead. Use" Delsym" or" Robitussin" cough syrup varietis for cough.  You can use plain "Tylenol" or "Advil" for fever, chills and achyness.  Please, make an appointment if you are not better or if you're worse.  

## 2013-03-18 NOTE — Assessment & Plan Note (Signed)
CXR Zpac Tessalon prn

## 2013-03-18 NOTE — Progress Notes (Signed)
   Subjective:    Patient ID: [name], female    DOB: 05/17/1940, 72 y.o.   MRN: [id number]  URI  This is a new problem. The current episode started 1 to 4 weeks ago (10 d). The problem has been gradually worsening. The maximum temperature recorded prior to her arrival was 100 - 100.9 F. The fever has been present for 5 days or more. Associated symptoms include congestion, coughing and sinus pain. Pertinent negatives include no ear pain, nausea, rhinorrhea or wheezing. The treatment provided mild relief.      Review of Systems  HENT: Positive for congestion. Negative for ear pain, hearing loss, nosebleeds, rhinorrhea and trouble swallowing.   Respiratory: Positive for cough. Negative for chest tightness, shortness of breath and wheezing.   Gastrointestinal: Negative for nausea.  Genitourinary: Negative for urgency.  Neurological: Positive for weakness.  Psychiatric/Behavioral: The patient is not nervous/anxious.        Objective:   Physical Exam  Constitutional: She appears well-developed. No distress.  HENT:  Head: Normocephalic.  Right Ear: External ear normal.  Left Ear: External ear normal.  Nose: Nose normal.  Mouth/Throat: Oropharynx is clear and moist. No oropharyngeal exudate.  eryth throat  Eyes: Conjunctivae are normal. Pupils are equal, round, and reactive to light. Right eye exhibits no discharge. Left eye exhibits no discharge.  Neck: Normal range of motion. Neck supple. No JVD present. No tracheal deviation present. No thyromegaly present.  Cardiovascular: Normal rate, regular rhythm and normal heart sounds.   Pulmonary/Chest: No stridor. No respiratory distress. She has no wheezes.  Abdominal: Soft. Bowel sounds are normal. She exhibits no distension and no mass. There is no tenderness. There is no rebound and no guarding.  Musculoskeletal: She exhibits no edema and no tenderness.  Lymphadenopathy:    She has no cervical adenopathy.  Neurological: She  displays normal reflexes. No cranial nerve deficit. She exhibits normal muscle tone. Coordination normal.  Skin: No rash noted. No erythema.  Psychiatric: She has a normal mood and affect. Her behavior is normal. Judgment and thought content normal.          Assessment & Plan:

## 2013-03-26 ENCOUNTER — Ambulatory Visit: Payer: Medicare HMO | Admitting: *Deleted

## 2013-04-20 ENCOUNTER — Encounter: Payer: Self-pay | Admitting: Internal Medicine

## 2013-04-20 ENCOUNTER — Ambulatory Visit (INDEPENDENT_AMBULATORY_CARE_PROVIDER_SITE_OTHER): Payer: Medicare HMO | Admitting: Internal Medicine

## 2013-04-20 VITALS — BP 122/74 | HR 79 | Temp 98.0°F | Wt 182.0 lb

## 2013-04-20 DIAGNOSIS — E039 Hypothyroidism, unspecified: Secondary | ICD-10-CM

## 2013-04-20 DIAGNOSIS — I10 Essential (primary) hypertension: Secondary | ICD-10-CM

## 2013-04-20 DIAGNOSIS — E119 Type 2 diabetes mellitus without complications: Secondary | ICD-10-CM

## 2013-04-20 DIAGNOSIS — E663 Overweight: Secondary | ICD-10-CM

## 2013-04-20 NOTE — Assessment & Plan Note (Signed)
Lab Results  Component Value Date   HGBA1C 6.5 11/24/2012   She did not get satisfaction from Diabetes nutrional counseling. She has seen Ms. Wynetta Emery RD - she has been told she has multiple food and chemical allergies - lab panel from clinical lab in [place]. She has not seen an allergist.  Plan Continue life-style management

## 2013-04-20 NOTE — Assessment & Plan Note (Signed)
BP Readings from Last 3 Encounters:  04/20/13 122/74  03/18/13 104/62  03/02/13 138/84   Good control on present medications

## 2013-04-20 NOTE — Progress Notes (Signed)
Pre visit review using our clinic review tool, if applicable. No additional management support is needed unless otherwise documented below in the visit note. 

## 2013-04-20 NOTE — Assessment & Plan Note (Signed)
Lab Results  Component Value Date   TSH 2.52 11/24/2012   Good control. Repeat lab Sept '15

## 2013-04-20 NOTE — Assessment & Plan Note (Signed)
Patient aware of the importance of weight mangement. She has seen a nutritionist [place] and a private RD, Ms. Wynetta Emery. She is very concerned about testing by ms.Wynetta Emery that revealed multiple food allergies and chemical allergies.  Plan Recommended pharmacy consult in regard to inert ingredients of her Rx meds  She may want to seen an allergist to augment the nutritionist diagnosis of multiple food allergy  Diet management: smart food choices, PORTION SIZE CONTROL, regular exercise. Goal - to loose 1-2 lbs.month. [employer] weight - 150 lbs

## 2013-04-20 NOTE — Patient Instructions (Signed)
Good to see you.  In regard to the list of chemical allergens to which you tested positive you will need to have a consultation with a pharmacist in regard to the inert ingredients of your prescriptions products and whether there is a non-allergenic alternative.   The x-ray did not show any active disease. The radiologist felt there was poor inspiration, lack of a deep breath. The comment of possible atelectasis, lack of deep breath, vs minor scarring from any old infection.

## 2013-04-20 NOTE — Progress Notes (Signed)
Subjective:    Patient ID: [name], female    DOB: January 05, 1941, 73 y.o.   MRN: [id number]  HPI [name] was seen Dec 31st by Dr. Alain Marion - diagnosed with bronchitis. She had a CXR that was unremarkable. She was treated with Azithromycin. She thought she had the flu but not. She did have a question about the x-ray finding of atelectasis vs minor scarring.  She has see Deatra Ina, RD outside nutritionist who did a battery of test for food and drug allergy - [facility] testing out of [place]. She had multiple allergies including food coloring, fillers, etc that Ms. Wynetta Emery said were in her medications. She wants to know more. Recommend pharmacy consult in regard to her med list and the inert ingredients and if there are non-allergenic alternative.   She did have an episode of sharp shooting in the area of the right eye. She did see Dr. Herbert Deaner who felt she had an infection which is being treated. She has been advised to stay away from swimming. She has been on a steroid drop, pred forte 1% and acular 0.5%.   Past Medical History  Diagnosis Date  . Hypertension   . Hypothyroidism   . Sleep apnea     Dr Gwenette Greet CPAP 2009  . Depression   . Diabetes mellitus type II     diet  . Allergic rhinitis   . Anxiety   . Chronic cough     Dr Melvyn Novas, onset 11/2007, Sinus CT July 7,2010>neg, Resolved 10/2008 on ppi two times a day  . Obesity (BMI 30-39.9)   . Pneumonia   . Hiatal hernia   . GERD (gastroesophageal reflux disease)   . Dyslipidemia   . Lung nodule   . Diverticulosis   . Colon polyp   . Arthritis    Past Surgical History  Procedure Laterality Date  . Left breast lumpectomy    . Tonsillectomy      x 2  . Cholecystectomy      1998 in [place]  . Rectocele repair    . Bladder suspension    . Ventral hernia repair    . Appendectomy    . Abdominal hysterectomy    . Nissen fundoplication      March '11 Dr Johney Maine  . Eye surgery  10/13    and 12/13; cataract surgery   Family  History  Problem Relation Age of Onset  . Stroke Mother 48  . Lung cancer Father    History   Social History  . Marital Status: Single    Spouse Name: N/A    Number of Children: N/A  . Years of Education: N/A   Occupational History  . retired Systems analyst    Social History Main Topics  . Smoking status: Former Smoker -- 1.50 packs/day for 25 years    Types: Cigarettes    Quit date: 10/28/1981  . Smokeless tobacco: Never Used  . Alcohol Use: No  . Drug Use: No  . Sexual Activity: Not Currently   Other Topics Concern  . Not on file   Social History Narrative   HSG. Married -divorced. 2 sons. 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 Patient does not get regular exercise. Loves to read. Strong faith. Lives alone.    Current Outpatient Prescriptions on File Prior to Visit  Medication Sig Dispense Refill  . ALPRAZolam (XANAX) 0.25 MG tablet Take 0.25 mg by mouth at bedtime as needed for sleep.      Marland Kitchen  aspirin 81 MG tablet Take 81 mg by mouth daily.      Marland Kitchen azithromycin (ZITHROMAX) 250 MG tablet As directed  6 tablet  0  . benzonatate (TESSALON) 200 MG capsule Take 1 capsule (200 mg total) by mouth 3 (three) times daily as needed for cough.  20 capsule  0  . Calcium Carbonate-Vitamin D (CALCIUM 600+D) 600-200 MG-UNIT TABS Take by mouth daily.        . Cinnamon 500 MG TABS Take by mouth daily.        Marland Kitchen diltiazem (DILT-CD) 180 MG 24 hr capsule Take 1 capsule (180 mg total) by mouth every evening.  90 capsule  3  . erythromycin base (E-MYCIN) 500 MG tablet Take 1 tablet (500 mg total) by mouth 4 (four) times daily.  40 tablet  0  . FLUoxetine (PROZAC) 20 MG capsule TAKE 1 CAPSULE AT BEDTIME  90 capsule  3  . Ginkgo Biloba 40 MG TABS Take by mouth daily.        Marland Kitchen glucose blood (ONETOUCH VERIO) test strip Use as instructed.  200 each  1  . indapamide (LOZOL) 1.25 MG tablet Take 2 tablets (2.5 mg total) by mouth every morning.  30 tablet  1  . levothyroxine (SYNTHROID) 75  MCG tablet Take 1 tablet (75 mcg total) by mouth daily.  90 tablet  3  . losartan (COZAAR) 100 MG tablet Take 1 tablet (100 mg total) by mouth daily.  90 tablet  3  . Multiple Vitamins-Minerals (CENTRUM SILVER PO) Take by mouth daily.        . ONE TOUCH LANCETS MISC Use as directed twice daily to test blood glucose levels.  200 each  11  . ONE TOUCH LANCETS MISC Use with One Touch Verio monitor as directed Twice Daily to test blood glucose levels. DX: 250.60  200 each  5  . ONETOUCH VERIO test strip USE AS DIRECTED  100 each  1  . potassium chloride SA (KLOR-CON M20) 20 MEQ tablet Take 1 tablet (20 mEq total) by mouth daily.  90 tablet  3  . Probiotic Product (PROBIOTIC & ACIDOPHILUS EX ST PO) Take 1 tablet by mouth daily.      . Red Yeast Rice 600 MG CAPS Take 1 capsule (600 mg total) by mouth daily.       No current facility-administered medications on file prior to visit.      Review of Systems System review is negative for any constitutional, cardiac, pulmonary, GI or neuro symptoms or complaints other than as described in the HPI.     Objective:   Physical Exam Filed Vitals:   04/20/13 1526  BP: 122/74  Pulse: 79  Temp: 98 F (36.7 C)   Wt Readings from Last 3 Encounters:  04/20/13 182 lb (82.555 kg)  03/18/13 187 lb (84.823 kg)  03/02/13 185 lb 6.4 oz (84.097 kg)   Body mass index is 38.05 kg/(m^2).  Gen'l - overweight woman in no distress HEENT- C&S clear, OD w/o abnormality on exam: no palpebral or conjunctival erythema, no periorbital swell or other abnormality Cor 2+ radial pulse, RRR Pulm  Normal respirations Neuro - alert and oriented, cognition is normal.       Assessment & Plan:

## 2013-04-21 ENCOUNTER — Telehealth: Payer: Self-pay | Admitting: Internal Medicine

## 2013-04-21 NOTE — Telephone Encounter (Signed)
Relevant patient education assigned to patient using Emmi. ° °

## 2013-04-23 ENCOUNTER — Telehealth: Payer: Self-pay

## 2013-04-23 NOTE — Telephone Encounter (Signed)
Relevant patient education assigned to patient using Emmi. ° °

## 2013-04-29 LAB — HM DIABETES EYE EXAM

## 2013-05-08 ENCOUNTER — Telehealth: Payer: Self-pay | Admitting: *Deleted

## 2013-05-08 NOTE — Telephone Encounter (Signed)
Patient phoned and left message on triage line for Ondresa to return her call.  CB# [phone]

## 2013-05-08 NOTE — Telephone Encounter (Signed)
I spoke to patient and she would like to know which physician you suggest she see when you retire. Either Dr Ronnald Ramp or Dr Alain Marion? Please advise.

## 2013-05-10 NOTE — Telephone Encounter (Signed)
DR. Ronnald Ramp

## 2013-05-11 NOTE — Telephone Encounter (Signed)
Patient has been advised

## 2013-05-22 ENCOUNTER — Ambulatory Visit (INDEPENDENT_AMBULATORY_CARE_PROVIDER_SITE_OTHER): Payer: Medicare HMO | Admitting: Internal Medicine

## 2013-05-22 ENCOUNTER — Encounter: Payer: Self-pay | Admitting: Internal Medicine

## 2013-05-22 VITALS — BP 120/74 | HR 70 | Temp 98.1°F | Wt 181.0 lb

## 2013-05-22 DIAGNOSIS — K5732 Diverticulitis of large intestine without perforation or abscess without bleeding: Secondary | ICD-10-CM

## 2013-05-22 MED ORDER — CIPROFLOXACIN HCL 500 MG PO TABS: 500.0000 mg | ORAL_TABLET | Freq: Two times a day (BID) | ORAL | Status: DC

## 2013-05-22 MED ORDER — METRONIDAZOLE 500 MG PO TABS: 500.0000 mg | ORAL_TABLET | Freq: Three times a day (TID) | ORAL | Status: DC

## 2013-05-22 NOTE — Progress Notes (Signed)
Pre visit review using our clinic review tool, if applicable. No additional management support is needed unless otherwise documented below in the visit note. 

## 2013-05-22 NOTE — Progress Notes (Signed)
Subjective:    Patient ID: [name], female    DOB: 1940/11/27, 73 y.o.   MRN: [id number]  HPI Ms. Casteneda presents for a 5 day h/o mid-abdominal pain that radiates from the front to the back. It is worse after eating. She has a good appetite, no change in bowel habit. No blood in th estool. She describes the pain as sharp pain, duration is about an hour and will than ease off but not go away. No recollection of any precipitating injury or strain. No fevers or chills. No gross blood in the urine. She has tried APAP for pain which does help; ibuprofen also eases the pain. No pain with movement. The pain will wake her up - she compensates by not eating late. She does have known diverticulosis on colonoscopy in '08.  Past Medical History  Diagnosis Date  . Hypertension   . Hypothyroidism   . Sleep apnea     Dr Gwenette Greet CPAP 2009  . Depression   . Diabetes mellitus type II     diet  . Allergic rhinitis   . Anxiety   . Chronic cough     Dr Melvyn Novas, onset 11/2007, Sinus CT July 7,2010>neg, Resolved 10/2008 on ppi two times a day  . Obesity (BMI 30-39.9)   . Pneumonia   . Hiatal hernia   . GERD (gastroesophageal reflux disease)   . Dyslipidemia   . Lung nodule   . Diverticulosis   . Colon polyp   . Arthritis    Past Surgical History  Procedure Laterality Date  . Left breast lumpectomy    . Tonsillectomy      x 2  . Cholecystectomy      1998 in [place]  . Rectocele repair    . Bladder suspension    . Ventral hernia repair    . Appendectomy    . Abdominal hysterectomy    . Nissen fundoplication      March '11 Dr Johney Maine  . Eye surgery  10/13    and 12/13; cataract surgery   Family History  Problem Relation Age of Onset  . Stroke Mother 21  . Lung cancer Father    History   Social History  . Marital Status: Single    Spouse Name: N/A    Number of Children: N/A  . Years of Education: N/A   Occupational History  . retired Systems analyst    Social History Main  Topics  . Smoking status: Former Smoker -- 1.50 packs/day for 25 years    Types: Cigarettes    Quit date: 10/28/1981  . Smokeless tobacco: Never Used  . Alcohol Use: No  . Drug Use: No  . Sexual Activity: Not Currently   Other Topics Concern  . Not on file   Social History Narrative   HSG. Married -divorced. 2 sons. 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 Patient does not get regular exercise. Loves to read. Strong faith. Lives alone.     Current Outpatient Prescriptions on File Prior to Visit  Medication Sig Dispense Refill  . ALPRAZolam (XANAX) 0.25 MG tablet Take 0.25 mg by mouth at bedtime as needed for sleep.      Marland Kitchen aspirin 81 MG tablet Take 81 mg by mouth daily.      Marland Kitchen azithromycin (ZITHROMAX) 250 MG tablet As directed  6 tablet  0  . benzonatate (TESSALON) 200 MG capsule Take 1 capsule (200 mg total) by mouth 3 (three) times daily as needed for cough.  20 capsule  0  . Calcium Carbonate-Vitamin D (CALCIUM 600+D) 600-200 MG-UNIT TABS Take by mouth daily.        . Cinnamon 500 MG TABS Take by mouth daily.        Marland Kitchen diltiazem (DILT-CD) 180 MG 24 hr capsule Take 1 capsule (180 mg total) by mouth every evening.  90 capsule  3  . erythromycin base (E-MYCIN) 500 MG tablet Take 1 tablet (500 mg total) by mouth 4 (four) times daily.  40 tablet  0  . FLUoxetine (PROZAC) 20 MG capsule TAKE 1 CAPSULE AT BEDTIME  90 capsule  3  . Ginkgo Biloba 40 MG TABS Take by mouth daily.        Marland Kitchen glucose blood (ONETOUCH VERIO) test strip Use as instructed.  200 each  1  . indapamide (LOZOL) 1.25 MG tablet Take 2 tablets (2.5 mg total) by mouth every morning.  30 tablet  1  . levothyroxine (SYNTHROID) 75 MCG tablet Take 1 tablet (75 mcg total) by mouth daily.  90 tablet  3  . losartan (COZAAR) 100 MG tablet Take 1 tablet (100 mg total) by mouth daily.  90 tablet  3  . Multiple Vitamins-Minerals (CENTRUM SILVER PO) Take by mouth daily.        . ONE TOUCH LANCETS MISC Use as directed twice  daily to test blood glucose levels.  200 each  11  . ONE TOUCH LANCETS MISC Use with One Touch Verio monitor as directed Twice Daily to test blood glucose levels. DX: 250.60  200 each  5  . ONETOUCH VERIO test strip USE AS DIRECTED  100 each  1  . potassium chloride SA (KLOR-CON M20) 20 MEQ tablet Take 1 tablet (20 mEq total) by mouth daily.  90 tablet  3  . prednisoLONE acetate (PRED FORTE) 1 % ophthalmic suspension Place 1 drop into the right eye.      . Probiotic Product (PROBIOTIC & ACIDOPHILUS EX ST PO) Take 1 tablet by mouth daily.      . Red Yeast Rice 600 MG CAPS Take 1 capsule (600 mg total) by mouth daily.      Marland Kitchen UNABLE TO FIND ACCULAR 0.5%--RIGHT EYE       No current facility-administered medications on file prior to visit.       Review of Systems System review is negative for any constitutional, cardiac, pulmonary, GI or neuro symptoms or complaints other than as described in the HPI.     Objective:   Physical Exam Filed Vitals:   05/22/13 1158  BP: 120/74  Pulse: 70  Temp: 98.1 F (36.7 C)   Gen'l - overweight woman in no acute distress Cor- RRR PUlm - no increased work of breathing, no overt wheezing Abd - obese, BS+ x 4, no guarding or rebound. Tender to palpation - worst at the LLQ       Assessment & Plan:  LLQ abdominal pain - on exam she is very tender in the left lower quadrant. [place] diverticulosis. Suspect this pain is related to mild diverticulitis.  Plan Continue probiotics  Antibiotics - Cipro 500 mg twice a day x 7 days            Flagyl - 500 mg three times a day for 7 days.

## 2013-05-22 NOTE — Patient Instructions (Signed)
LLQ abdominal pain - on exam she is very tender in the left lower quadrant. [place] diverticulosis. Suspect this pain is related to mild diverticulitis.  Plan Continue probiotics  Antibiotics - Cipro 500 mg twice a day x 7 days            Flagyl - 500 mg three times a day for 7 days.  Diverticulitis A diverticulum is a small pouch or sac on the colon. Diverticulosis is the presence of these diverticula on the colon. Diverticulitis is the irritation (inflammation) or infection of diverticula. CAUSES  The colon and its diverticula contain bacteria. If food particles block the tiny opening to a diverticulum, the bacteria inside can grow and cause an increase in pressure. This leads to infection and inflammation and is called diverticulitis. SYMPTOMS   Abdominal pain and tenderness. Usually, the pain is located on the left side of your abdomen. However, it could be located elsewhere.  Fever.  Bloating.  Feeling sick to your stomach (nausea).  Throwing up (vomiting).  Abnormal stools. DIAGNOSIS  Your caregiver will take a history and perform a physical exam. Since many things can cause abdominal pain, other tests may be necessary. Tests may include:  Blood tests.  Urine tests.  X-ray of the abdomen.  CT scan of the abdomen. Sometimes, surgery is needed to determine if diverticulitis or other conditions are causing your symptoms. TREATMENT  Most of the time, you can be treated without surgery. Treatment includes:  Resting the bowels by only having liquids for a few days. As you improve, you will need to eat a low-fiber diet.  Intravenous (IV) fluids if you are losing body fluids (dehydrated).  Antibiotic medicines that treat infections may be given.  Pain and nausea medicine, if needed.  Surgery if the inflamed diverticulum has burst. HOME CARE INSTRUCTIONS   Try a clear liquid diet (broth, tea, or water for as long as directed by your caregiver). You may then gradually begin  a low-fiber diet as tolerated.  A low-fiber diet is a diet with less than 10 grams of fiber. Choose the foods below to reduce fiber in the diet:  White breads, cereals, rice, and pasta.  Cooked fruits and vegetables or soft fresh fruits and vegetables without the skin.  Ground or well-cooked tender beef, ham, veal, lamb, pork, or poultry.  Eggs and seafood.  After your diverticulitis symptoms have improved, your caregiver may put you on a high-fiber diet. A high-fiber diet includes 14 grams of fiber for every 1000 calories consumed. For a standard 2000 calorie diet, you would need 28 grams of fiber. Follow these diet guidelines to help you increase the fiber in your diet. It is important to slowly increase the amount fiber in your diet to avoid gas, constipation, and bloating.  Choose whole-grain breads, cereals, pasta, and brown rice.  Choose fresh fruits and vegetables with the skin on. Do not overcook vegetables because the more vegetables are cooked, the more fiber is lost.  Choose more nuts, seeds, legumes, dried peas, beans, and lentils.  Look for food products that have greater than 3 grams of fiber per serving on the Nutrition Facts label.  Take all medicine as directed by your caregiver.  If your caregiver has given you a follow-up appointment, it is very important that you go. Not going could result in lasting (chronic) or permanent injury, pain, and disability. If there is any problem keeping the appointment, call to reschedule. SEEK MEDICAL CARE IF:   Your pain  does not improve.  You have a hard time advancing your diet beyond clear liquids.  Your bowel movements do not return to normal. SEEK IMMEDIATE MEDICAL CARE IF:   Your pain becomes worse.  You have an oral temperature above 102 F (38.9 C), not controlled by medicine.  You have repeated vomiting.  You have bloody or black, tarry stools.  Symptoms that brought you to your caregiver become worse or are not  getting better. MAKE SURE YOU:   Understand these instructions.  Will watch your condition.  Will get help right away if you are not doing well or get worse. Document Released: 12/13/2004 Document Revised: 05/28/2011 Document Reviewed: 04/10/2010 [facility] Patient Information 2014 [place].

## 2013-05-25 ENCOUNTER — Ambulatory Visit: Payer: Medicare HMO | Admitting: Internal Medicine

## 2013-05-27 ENCOUNTER — Ambulatory Visit (INDEPENDENT_AMBULATORY_CARE_PROVIDER_SITE_OTHER): Payer: Medicare HMO | Admitting: Internal Medicine

## 2013-05-27 ENCOUNTER — Telehealth: Payer: Self-pay | Admitting: Internal Medicine

## 2013-05-27 ENCOUNTER — Encounter: Payer: Self-pay | Admitting: Internal Medicine

## 2013-05-27 ENCOUNTER — Ambulatory Visit (INDEPENDENT_AMBULATORY_CARE_PROVIDER_SITE_OTHER)
Admission: RE | Admit: 2013-05-27 | Discharge: 2013-05-27 | Disposition: A | Discharge status: Home / Self Care | Payer: Medicare HMO | Source: Ambulatory Visit | Attending: Internal Medicine | Admitting: Internal Medicine

## 2013-05-27 VITALS — BP 110/84 | HR 64 | Temp 98.3°F | Wt 181.0 lb

## 2013-05-27 DIAGNOSIS — R1032 Left lower quadrant pain: Secondary | ICD-10-CM

## 2013-05-27 DIAGNOSIS — K5732 Diverticulitis of large intestine without perforation or abscess without bleeding: Secondary | ICD-10-CM

## 2013-05-27 MED ORDER — PREDNISONE 50 MG PO TABS: ORAL_TABLET | ORAL | Status: DC

## 2013-05-27 NOTE — Progress Notes (Signed)
   Subjective:    Patient ID: [name], female    DOB: 1940-09-04, 73 y.o.   MRN: [id number]  HPI Ms. Michels was seen March 6th and diagnosed with diverticulitis treated with cipro and flagyl. She called today to report increased abdominal pain, like her bowels were twisted, and diarrhea for the past 3 days. She has been able to take her antibiotics, fluids and some food. She is also taking metamucil to help with the loose stools.  A CT abd/pelvis was requested bu the patient has a dye allergy and she will need pretreatment. Due to delay in the diagnostic study she is brought in for assessment.  PMH, FamHx and SocHx reviewed for any changes and relevance.  Medications reviewed - no change since the 6th except addition of antibiotics.    Review of Systems System review is negative for any constitutional, cardiac, pulmonary, GI or neuro symptoms or complaints other than as described in the HPI.     Objective:   Physical Exam Filed Vitals:   05/27/13 1410  BP: 110/84  Pulse: 64  Temp: 98.3 F (36.8 C)   BP Readings from Last 3 Encounters:  05/27/13 110/84  05/22/13 120/74  04/20/13 122/74   Gen'l - overweight woman who is not in acute distress but uncomfortable Cor- RRR Pulm - slightly labored breathing, no rales or wheezing Abd - BS+ hypoactive in all quadrants, no HSM, no guarding or rebound, diffusely tender, worse in the LLQ.       Assessment & Plan:  Abdominal pain - patient with LLQ abdominal pain on cipro and flagyl. On exam she does not have an acute abdomen: no sign of acute obstruction. She is hemodynamically stable.  Plan 2 View abdomen to r/o acute obstruction or ileus (unlikely with postive BS)  Pretreatment for contrast allergy: prednisone 50 mg 13, 7, 1 hr prior to study and benadryl 50 mg 1 hours prior to study  CT abd/Pelvis Friday, march 13th to r/o more acute problems.   Addendum: 2 View abdomen - IMPRESSION  Moderate stool burden throughout the  colon. Prior cholecystectomy.  No obstruction or ileus.

## 2013-05-27 NOTE — Telephone Encounter (Signed)
Per Dr Linda Hedges he would like to have patient come in for an appt at 1:30. She is already aware.

## 2013-05-27 NOTE — Telephone Encounter (Signed)
Will have her scheduled for CT abdomen and pelvis with contrast. She should hear about this today

## 2013-05-27 NOTE — Progress Notes (Signed)
Pre visit review using our clinic review tool, if applicable. No additional management support is needed unless otherwise documented below in the visit note. 

## 2013-05-27 NOTE — Patient Instructions (Signed)
Abdominal pain - patient with LLQ abdominal pain on cipro and flagyl. On exam she does not have an acute abdomen: no sign of acute obstruction. She is hemodynamically stable.  Plan 2 View abdomen to r/o acute obstruction or ileus (unlikely with postive BS)  Pretreatment for contrast allergy: prednisone 50 mg 13, 7, 1 hr prior to study and benadryl 50 mg 1 hours prior to study  CT abd/Pelvis Friday, march 13th to r/o more acute problems.

## 2013-05-27 NOTE — Telephone Encounter (Signed)
Pt was seen Friday.  The diverticulitis is worse.  Having diarrhea since Sunday.  Taking probiotics along with the antibiotics.  Feels like something is twisted inside of her and drawn up.

## 2013-05-27 NOTE — Telephone Encounter (Signed)
Rose with [facility] called states patient is allergic to contrast. She needs to be pre-medicated before her CT on Friday

## 2013-05-29 ENCOUNTER — Other Ambulatory Visit: Payer: Medicare HMO

## 2013-05-29 ENCOUNTER — Telehealth: Payer: Self-pay

## 2013-05-29 ENCOUNTER — Other Ambulatory Visit: Payer: Self-pay

## 2013-05-29 DIAGNOSIS — R1032 Left lower quadrant pain: Secondary | ICD-10-CM

## 2013-05-29 MED ORDER — PREDNISONE 50 MG PO TABS: ORAL_TABLET | ORAL | Status: DC

## 2013-05-29 NOTE — Telephone Encounter (Signed)
Phoned the Pt and left message advising more prednisone has been sent to her pharmacy.  Rx has been sent

## 2013-05-29 NOTE — Telephone Encounter (Signed)
Phone call from [place] from [place] [phone] stating Pt is still waiting for insurance approval. Pt took a Prednisone yesterday thinking would have test today please send another Prednisone to the Pt pharmacy.

## 2013-05-29 NOTE — Telephone Encounter (Signed)
Ok to send prednisone 50 mg tab, # 2  to pharmacy

## 2013-06-01 ENCOUNTER — Telehealth: Payer: Self-pay

## 2013-06-01 DIAGNOSIS — R109 Unspecified abdominal pain: Secondary | ICD-10-CM

## 2013-06-01 NOTE — Telephone Encounter (Signed)
Patient states she is still having a lot of pain. I let her know labs are ordered. She states she will come tomorrow morning.

## 2013-06-01 NOTE — Telephone Encounter (Signed)
Phone call from [facility] with [facility] [phone]. She states patient has not had blood work done recently and she will need this done prior to her CT that is still being scheduled. Rose specifically stated BUN and Creatinine

## 2013-06-01 NOTE — Telephone Encounter (Signed)
Call patient - if still having a lot of pain - ok for Bmet. If pain is better - cancel CT

## 2013-06-02 ENCOUNTER — Other Ambulatory Visit: Payer: Medicare HMO

## 2013-06-02 ENCOUNTER — Other Ambulatory Visit (INDEPENDENT_AMBULATORY_CARE_PROVIDER_SITE_OTHER): Payer: Medicare HMO

## 2013-06-02 DIAGNOSIS — R109 Unspecified abdominal pain: Secondary | ICD-10-CM

## 2013-06-02 LAB — BASIC METABOLIC PANEL
BUN: 21 mg/dL (ref 6–23)
CHLORIDE: 99 meq/L (ref 96–112)
CO2: 29 mEq/L (ref 19–32)
Calcium: 9.5 mg/dL (ref 8.4–10.5)
Creatinine, Ser: 0.9 mg/dL (ref 0.4–1.2)
GFR: 76.74 mL/min (ref >60.00)
Glucose, Bld: 127 mg/dL — ABNORMAL HIGH (ref 70–99)
POTASSIUM: 4 meq/L (ref 3.5–5.1)
Sodium: 136 mEq/L (ref 135–145)

## 2013-06-15 ENCOUNTER — Telehealth: Payer: Self-pay | Admitting: Internal Medicine

## 2013-06-15 NOTE — Telephone Encounter (Signed)
Dr. Linda Hedges had reccommended Dr. Ronnald Ramp to [name] to be her new provider, but she isn't on our list.  Will it be ok for her to transition to Dr. Ronnald Ramp?

## 2013-06-15 NOTE — Telephone Encounter (Signed)
Dr . Linda Hedges has place an  Order for pt to have a CT  [place] .pt just got an authorization from [place] today  . Pt wants to know since Dr Linda Hedges is no longer here, Does Dr. Ronnald Ramp think this CT is still necessary to have , pt states she has Diverticulitis  And want to know if Dr Ronnald Ramp is familiar with her condition , Pt states she would like Dr Ronnald Ramp to become her MD  because Dr. Linda Hedges highly recommended him ,and also would like a call from him or his assistance regarding this matter please advise

## 2013-06-15 NOTE — Telephone Encounter (Signed)
yes

## 2013-06-16 NOTE — Telephone Encounter (Signed)
I will not comment on this since I have not seen her

## 2013-06-17 ENCOUNTER — Ambulatory Visit (INDEPENDENT_AMBULATORY_CARE_PROVIDER_SITE_OTHER)
Admission: RE | Admit: 2013-06-17 | Discharge: 2013-06-17 | Disposition: A | Discharge status: Home / Self Care | Payer: Commercial Managed Care - HMO | Source: Ambulatory Visit | Attending: Internal Medicine | Admitting: Internal Medicine

## 2013-06-17 DIAGNOSIS — R1032 Left lower quadrant pain: Secondary | ICD-10-CM | POA: Diagnosis not present

## 2013-06-17 DIAGNOSIS — K5732 Diverticulitis of large intestine without perforation or abscess without bleeding: Secondary | ICD-10-CM

## 2013-06-17 MED ORDER — IOHEXOL 300 MG/ML  SOLN
100.0000 mL | Freq: Once | INTRAMUSCULAR | Status: AC | PRN
  Administered 2013-06-17: 100 mL via INTRAVENOUS

## 2013-06-17 NOTE — Telephone Encounter (Signed)
LMOM to call.

## 2013-06-17 NOTE — Telephone Encounter (Signed)
Pt is aware and has scheduled an appt with Dr. Ronnald Ramp.

## 2013-06-25 ENCOUNTER — Other Ambulatory Visit (INDEPENDENT_AMBULATORY_CARE_PROVIDER_SITE_OTHER): Payer: Commercial Managed Care - HMO

## 2013-06-25 ENCOUNTER — Encounter: Payer: Self-pay | Admitting: Internal Medicine

## 2013-06-25 ENCOUNTER — Ambulatory Visit (INDEPENDENT_AMBULATORY_CARE_PROVIDER_SITE_OTHER): Payer: Commercial Managed Care - HMO | Admitting: Internal Medicine

## 2013-06-25 VITALS — BP 126/82 | HR 81 | Temp 97.3°F | Resp 16 | Wt 183.8 lb

## 2013-06-25 DIAGNOSIS — E1165 Type 2 diabetes mellitus with hyperglycemia: Secondary | ICD-10-CM

## 2013-06-25 DIAGNOSIS — E039 Hypothyroidism, unspecified: Secondary | ICD-10-CM

## 2013-06-25 DIAGNOSIS — IMO0001 Reserved for inherently not codable concepts without codable children: Secondary | ICD-10-CM

## 2013-06-25 DIAGNOSIS — E785 Hyperlipidemia, unspecified: Secondary | ICD-10-CM | POA: Insufficient documentation

## 2013-06-25 DIAGNOSIS — F411 Generalized anxiety disorder: Secondary | ICD-10-CM

## 2013-06-25 DIAGNOSIS — I10 Essential (primary) hypertension: Secondary | ICD-10-CM

## 2013-06-25 LAB — URINALYSIS, ROUTINE W REFLEX MICROSCOPIC
Bilirubin Urine: NEGATIVE
HGB URINE DIPSTICK: NEGATIVE
Ketones, ur: NEGATIVE
Leukocytes, UA: NEGATIVE
Nitrite: NEGATIVE
Specific Gravity, Urine: 1.02 (ref 1.000–1.030)
TOTAL PROTEIN, URINE-UPE24: NEGATIVE
URINE GLUCOSE: NEGATIVE
Urobilinogen, UA: 0.2 (ref 0.0–1.0)
pH: 6.5 (ref 5.0–8.0)

## 2013-06-25 LAB — HM DIABETES FOOT EXAM

## 2013-06-25 LAB — BASIC METABOLIC PANEL
BUN: 20 mg/dL (ref 6–23)
CO2: 31 meq/L (ref 19–32)
Calcium: 9.2 mg/dL (ref 8.4–10.5)
Chloride: 102 mEq/L (ref 96–112)
Creatinine, Ser: 0.7 mg/dL (ref 0.4–1.2)
GFR: 98.65 mL/min (ref >60.00)
Glucose, Bld: 113 mg/dL — ABNORMAL HIGH (ref 70–99)
Potassium: 3.3 mEq/L — ABNORMAL LOW (ref 3.5–5.1)
SODIUM: 140 meq/L (ref 135–145)

## 2013-06-25 LAB — LIPID PANEL
CHOL/HDL RATIO: 4
Cholesterol: 213 mg/dL — ABNORMAL HIGH (ref 0–200)
HDL: 56 mg/dL (ref >39.00)
LDL Cholesterol: 135 mg/dL — ABNORMAL HIGH (ref 0–99)
Triglycerides: 112 mg/dL (ref 0.0–149.0)
VLDL: 22.4 mg/dL (ref 0.0–40.0)

## 2013-06-25 LAB — TSH: TSH: 0.91 u[IU]/mL (ref 0.35–5.50)

## 2013-06-25 LAB — HEMOGLOBIN A1C: HEMOGLOBIN A1C: 6.8 % — AB (ref 4.6–6.5)

## 2013-06-25 MED ORDER — POTASSIUM CHLORIDE CRYS ER 20 MEQ PO TBCR: 20.0000 meq | EXTENDED_RELEASE_TABLET | Freq: Every day | ORAL | Status: DC

## 2013-06-25 MED ORDER — INDAPAMIDE 1.25 MG PO TABS: 2.5000 mg | ORAL_TABLET | ORAL | Status: DC

## 2013-06-25 MED ORDER — DILTIAZEM HCL ER COATED BEADS 180 MG PO CP24: 180.0000 mg | ORAL_CAPSULE | Freq: Every evening | ORAL | Status: DC

## 2013-06-25 MED ORDER — FLUOXETINE HCL 20 MG PO CAPS: ORAL_CAPSULE | ORAL | Status: DC

## 2013-06-25 MED ORDER — LEVOTHYROXINE SODIUM 75 MCG PO TABS: 75.0000 ug | ORAL_TABLET | Freq: Every day | ORAL | Status: DC

## 2013-06-25 MED ORDER — LOSARTAN POTASSIUM 100 MG PO TABS: 100.0000 mg | ORAL_TABLET | Freq: Every day | ORAL | Status: DC

## 2013-06-25 NOTE — Progress Notes (Signed)
Subjective:    Patient ID: [name], female    DOB: 1940-04-16, 73 y.o.   MRN: [id number]  Diabetes She presents for her follow-up diabetic visit. She has type 2 diabetes mellitus. The initial diagnosis of diabetes was made 3 years ago. Her disease course has been stable. There are no hypoglycemic associated symptoms. Pertinent negatives for hypoglycemia include no dizziness, headaches or tremors. There are no diabetic associated symptoms. Pertinent negatives for diabetes include no blurred vision, no chest pain, no fatigue, no foot paresthesias, no foot ulcerations, no polydipsia, no polyphagia, no polyuria, no visual change, no weakness and no weight loss. There are no hypoglycemic complications. There are no diabetic complications. Current diabetic treatment includes diet. She is compliant with treatment some of the time. Her weight is stable. She is following a generally healthy diet. Meal planning includes avoidance of concentrated sweets. She has not had a previous visit with a dietician. She participates in exercise intermittently. There is no change in her home blood glucose trend. An ACE inhibitor/angiotensin II receptor blocker is being taken. She does not see a podiatrist.Eye exam is current.      Review of Systems  Constitutional: Negative.  Negative for fever, chills, weight loss, diaphoresis, appetite change and fatigue.  HENT: Negative.   Eyes: Negative.  Negative for blurred vision.  Respiratory: Negative.  Negative for apnea, cough, choking, chest tightness, shortness of breath, wheezing and stridor.   Cardiovascular: Negative.  Negative for chest pain, palpitations and leg swelling.  Gastrointestinal: Negative.  Negative for nausea, vomiting, abdominal pain, diarrhea, constipation and blood in stool.  Endocrine: Negative.  Negative for polydipsia, polyphagia and polyuria.  Genitourinary: Negative.   Musculoskeletal: Negative.  Negative for arthralgias, back pain, gait  problem, joint swelling, myalgias, neck pain and neck stiffness.  Skin: Negative.   Allergic/Immunologic: Negative.   Neurological: Negative.  Negative for dizziness, tremors, facial asymmetry, weakness, light-headedness and headaches.  Hematological: Negative.  Negative for adenopathy. Does not bruise/bleed easily.  Psychiatric/Behavioral: Negative.        Objective:   Physical Exam  Vitals reviewed. Constitutional: She is oriented to person, place, and time. She appears well-developed and well-nourished. No distress.  HENT:  Head: Normocephalic and atraumatic.  Mouth/Throat: Oropharynx is clear and moist. No oropharyngeal exudate.  Eyes: Conjunctivae are normal. Right eye exhibits no discharge. Left eye exhibits no discharge. No scleral icterus.  Neck: Normal range of motion. Neck supple. No JVD present. No tracheal deviation present. No thyromegaly present.  Cardiovascular: Normal rate, regular rhythm, normal heart sounds and intact distal pulses.  Exam reveals no gallop and no friction rub.   No murmur heard. Pulmonary/Chest: Effort normal and breath sounds normal. No stridor. No respiratory distress. She has no wheezes. She has no rales. She exhibits no tenderness.  Abdominal: Soft. Bowel sounds are normal. She exhibits no distension and no mass. There is no tenderness. There is no rebound and no guarding.  Musculoskeletal: Normal range of motion. She exhibits no edema and no tenderness.  Lymphadenopathy:    She has no cervical adenopathy.  Neurological: She is oriented to person, place, and time.  Skin: Skin is warm and dry. No rash noted. She is not diaphoretic. No erythema. No pallor.     Lab Results  Component Value Date   WBC 9.6 03/27/2012   HGB 12.4 03/27/2012   HCT 37.3 03/27/2012   PLT 368.0 03/27/2012   GLUCOSE 127* 06/02/2013   CHOL 209* 11/24/2012   TRIG 71.0 11/24/2012  HDL 84.00 11/24/2012   LDLDIRECT 120.6 11/24/2012   LDLCALC 112* 06/28/2011   ALT 32 11/24/2012   ALT 32  11/24/2012   AST 27 11/24/2012   AST 27 11/24/2012   NA 136 06/02/2013   K 4.0 06/02/2013   CL 99 06/02/2013   CREATININE 0.9 06/02/2013   BUN 21 06/02/2013   CO2 29 06/02/2013   TSH 2.52 11/24/2012   INR 1.0 10/01/2011   HGBA1C 6.5 11/24/2012       Assessment & Plan:

## 2013-06-25 NOTE — Progress Notes (Signed)
Pre visit review using our clinic review tool, if applicable. No additional management support is needed unless otherwise documented below in the visit note. 

## 2013-06-25 NOTE — Patient Instructions (Signed)
Type 2 Diabetes Mellitus, Adult Type 2 diabetes mellitus, often simply referred to as type 2 diabetes, is a long-lasting (chronic) disease. In type 2 diabetes, the pancreas does not make enough insulin (a hormone), the cells are less responsive to the insulin that is made (insulin resistance), or both. Normally, insulin moves sugars from food into the tissue cells. The tissue cells use the sugars for energy. The lack of insulin or the lack of normal response to insulin causes excess sugars to build up in the blood instead of going into the tissue cells. As a result, high blood sugar (hyperglycemia) develops. The effect of high sugar (glucose) levels can cause many complications. Type 2 diabetes was also previously called adult-onset diabetes but it can occur at any age.  RISK FACTORS  A person is predisposed to developing type 2 diabetes if someone in the family has the disease and also has one or more of the following primary risk factors:  Overweight.  An inactive lifestyle.  A history of consistently eating high-calorie foods. Maintaining a normal weight and regular physical activity can reduce the chance of developing type 2 diabetes. SYMPTOMS  A person with type 2 diabetes may not show symptoms initially. The symptoms of type 2 diabetes appear slowly. The symptoms include:  Increased thirst (polydipsia).  Increased urination (polyuria).  Increased urination during the night (nocturia).  Weight loss. This weight loss may be rapid.  Frequent, recurring infections.  Tiredness (fatigue).  Weakness.  Vision changes, such as blurred vision.  Fruity smell to your breath.  Abdominal pain.  Nausea or vomiting.  Cuts or bruises which are slow to heal.  Tingling or numbness in the hands or feet. DIAGNOSIS Type 2 diabetes is frequently not diagnosed until complications of diabetes are present. Type 2 diabetes is diagnosed when symptoms or complications are present and when blood  glucose levels are increased. Your blood glucose level may be checked by one or more of the following blood tests:  A fasting blood glucose test. You will not be allowed to eat for at least 8 hours before a blood sample is taken.  A random blood glucose test. Your blood glucose is checked at any time of the day regardless of when you ate.  A hemoglobin A1c blood glucose test. A hemoglobin A1c test provides information about blood glucose control over the previous 3 months.  An oral glucose tolerance test (OGTT). Your blood glucose is measured after you have not eaten (fasted) for 2 hours and then after you drink a glucose-containing beverage. TREATMENT   You may need to take insulin or diabetes medicine daily to keep blood glucose levels in the desired range.  You will need to match insulin dosing with exercise and healthy food choices. The treatment goal is to maintain the before meal blood sugar (preprandial glucose) level at 70 130 mg/dL. HOME CARE INSTRUCTIONS   Have your hemoglobin A1c level checked twice a year.  Perform daily blood glucose monitoring as directed by your caregiver.  Monitor urine ketones when you are ill and as directed by your caregiver.  Take your diabetes medicine or insulin as directed by your caregiver to maintain your blood glucose levels in the desired range.  Never run out of diabetes medicine or insulin. It is needed every day.  Adjust insulin based on your intake of carbohydrates. Carbohydrates can raise blood glucose levels but need to be included in your diet. Carbohydrates provide vitamins, minerals, and fiber which are an essential part of   a healthy diet. Carbohydrates are found in fruits, vegetables, whole grains, dairy products, legumes, and foods containing added sugars.    Eat healthy foods. Alternate 3 meals with 3 snacks.  Lose weight if overweight.  Carry a medical alert card or wear your medical alert jewelry.  Carry a 15 gram  carbohydrate snack with you at all times to treat low blood glucose (hypoglycemia). Some examples of 15 gram carbohydrate snacks include:  Glucose tablets, 3 or 4   Glucose gel, 15 gram tube  Raisins, 2 tablespoons (24 grams)  Jelly beans, 6  Animal crackers, 8  Regular pop, 4 ounces (120 mL)  Gummy treats, 9  Recognize hypoglycemia. Hypoglycemia occurs with blood glucose levels of 70 mg/dL and below. The risk for hypoglycemia increases when fasting or skipping meals, during or after intense exercise, and during sleep. Hypoglycemia symptoms can include:  Tremors or shakes.  Decreased ability to concentrate.  Sweating.  Increased heart rate.  Headache.  Dry mouth.  Hunger.  Irritability.  Anxiety.  Restless sleep.  Altered speech or coordination.  Confusion.  Treat hypoglycemia promptly. If you are alert and able to safely swallow, follow the 15:15 rule:  Take 15 20 grams of rapid-acting glucose or carbohydrate. Rapid-acting options include glucose gel, glucose tablets, or 4 ounces (120 mL) of fruit juice, regular soda, or low fat milk.  Check your blood glucose level 15 minutes after taking the glucose.  Take 15 20 grams more of glucose if the repeat blood glucose level is still 70 mg/dL or below.  Eat a meal or snack within 1 hour once blood glucose levels return to normal.    Be alert to polyuria and polydipsia which are early signs of hyperglycemia. An early awareness of hyperglycemia allows for prompt treatment. Treat hyperglycemia as directed by your caregiver.  Engage in at least 150 minutes of moderate-intensity physical activity a week, spread over at least 3 days of the week or as directed by your caregiver. In addition, you should engage in resistance exercise at least 2 times a week or as directed by your caregiver.  Adjust your medicine and food intake as needed if you start a new exercise or sport.  Follow your sick day plan at any time you  are unable to eat or drink as usual.  Avoid tobacco use.  Limit alcohol intake to no more than 1 drink per day for nonpregnant women and 2 drinks per day for men. You should drink alcohol only when you are also eating food. Talk with your caregiver whether alcohol is safe for you. Tell your caregiver if you drink alcohol several times a week.  Follow up with your caregiver regularly.  Schedule an eye exam soon after the diagnosis of type 2 diabetes and then annually.  Perform daily skin and foot care. Examine your skin and feet daily for cuts, bruises, redness, nail problems, bleeding, blisters, or sores. A foot exam by a caregiver should be done annually.  Brush your teeth and gums at least twice a day and floss at least once a day. Follow up with your dentist regularly.  Share your diabetes management plan with your workplace or school.  Stay up-to-date with immunizations.  Learn to manage stress.  Obtain ongoing diabetes education and support as needed.  Participate in, or seek rehabilitation as needed to maintain or improve independence and quality of life. Request a physical or occupational therapy referral if you are having foot or hand numbness or difficulties with grooming,   dressing, eating, or physical activity. SEEK MEDICAL CARE IF:   You are unable to eat food or drink fluids for more than 6 hours.  You have nausea and vomiting for more than 6 hours.  Your blood glucose level is over 240 mg/dL.  There is a change in mental status.  You develop an additional serious illness.  You have diarrhea for more than 6 hours.  You have been sick or have had a fever for a couple of days and are not getting better.  You have pain during any physical activity.  SEEK IMMEDIATE MEDICAL CARE IF:  You have difficulty breathing.  You have moderate to large ketone levels. MAKE SURE YOU:  Understand these instructions.  Will watch your condition.  Will get help right away if  you are not doing well or get worse. Document Released: 03/05/2005 Document Revised: 11/28/2011 Document Reviewed: 10/02/2011 Exit[employer] Patient Information 2014 [employer].  

## 2013-06-26 NOTE — Assessment & Plan Note (Signed)
I will check her TSH and will adjust her dose if needed 

## 2013-06-26 NOTE — Assessment & Plan Note (Signed)
Her A1C is up slightly but her blood sugars are still well controlled

## 2013-06-26 NOTE — Assessment & Plan Note (Signed)
Her BP is well controlled 

## 2013-06-29 ENCOUNTER — Other Ambulatory Visit: Payer: Self-pay | Admitting: Internal Medicine

## 2013-06-29 ENCOUNTER — Other Ambulatory Visit: Payer: Self-pay | Admitting: *Deleted

## 2013-06-29 DIAGNOSIS — E039 Hypothyroidism, unspecified: Secondary | ICD-10-CM

## 2013-06-29 DIAGNOSIS — IMO0001 Reserved for inherently not codable concepts without codable children: Secondary | ICD-10-CM

## 2013-06-29 DIAGNOSIS — E1165 Type 2 diabetes mellitus with hyperglycemia: Secondary | ICD-10-CM

## 2013-06-29 MED ORDER — LEVOTHYROXINE SODIUM 75 MCG PO TABS: 75.0000 ug | ORAL_TABLET | Freq: Every day | ORAL | Status: DC

## 2013-06-29 MED ORDER — INDAPAMIDE 1.25 MG PO TABS: 2.5000 mg | ORAL_TABLET | ORAL | Status: DC

## 2013-06-29 MED ORDER — DILTIAZEM HCL ER COATED BEADS 180 MG PO CP24: 180.0000 mg | ORAL_CAPSULE | Freq: Every evening | ORAL | Status: DC

## 2013-06-29 MED ORDER — FLUOXETINE HCL 20 MG PO CAPS: ORAL_CAPSULE | ORAL | Status: DC

## 2013-06-29 MED ORDER — LOSARTAN POTASSIUM 100 MG PO TABS: 100.0000 mg | ORAL_TABLET | Freq: Every day | ORAL | Status: DC

## 2013-06-29 MED ORDER — POTASSIUM CHLORIDE CRYS ER 20 MEQ PO TBCR: 20.0000 meq | EXTENDED_RELEASE_TABLET | Freq: Every day | ORAL | Status: DC

## 2013-06-30 ENCOUNTER — Other Ambulatory Visit: Payer: Self-pay

## 2013-06-30 MED ORDER — GLUCOSE BLOOD VI STRP: ORAL_STRIP | Status: DC

## 2013-06-30 NOTE — Telephone Encounter (Signed)
Received refill request from [facility]   request refills for Verio Test. Rx last written and pt last seen 06/25/13. Thanks

## 2013-07-08 ENCOUNTER — Encounter: Payer: Self-pay | Admitting: Internal Medicine

## 2013-08-24 ENCOUNTER — Ambulatory Visit: Payer: Commercial Managed Care - HMO | Admitting: Internal Medicine

## 2013-08-28 ENCOUNTER — Ambulatory Visit: Payer: Commercial Managed Care - HMO | Admitting: Internal Medicine

## 2013-09-17 ENCOUNTER — Encounter: Payer: Self-pay | Admitting: Internal Medicine

## 2013-09-17 ENCOUNTER — Ambulatory Visit (INDEPENDENT_AMBULATORY_CARE_PROVIDER_SITE_OTHER): Payer: Commercial Managed Care - HMO | Admitting: Internal Medicine

## 2013-09-17 ENCOUNTER — Other Ambulatory Visit (INDEPENDENT_AMBULATORY_CARE_PROVIDER_SITE_OTHER): Payer: Commercial Managed Care - HMO

## 2013-09-17 VITALS — BP 124/82 | HR 69 | Temp 97.7°F | Resp 16 | Ht <= 58 in | Wt 186.0 lb

## 2013-09-17 DIAGNOSIS — R9431 Abnormal electrocardiogram [ECG] [EKG]: Secondary | ICD-10-CM

## 2013-09-17 DIAGNOSIS — R05 Cough: Secondary | ICD-10-CM

## 2013-09-17 DIAGNOSIS — IMO0001 Reserved for inherently not codable concepts without codable children: Secondary | ICD-10-CM

## 2013-09-17 DIAGNOSIS — R0989 Other specified symptoms and signs involving the circulatory and respiratory systems: Secondary | ICD-10-CM

## 2013-09-17 DIAGNOSIS — E1165 Type 2 diabetes mellitus with hyperglycemia: Secondary | ICD-10-CM

## 2013-09-17 DIAGNOSIS — I10 Essential (primary) hypertension: Secondary | ICD-10-CM

## 2013-09-17 DIAGNOSIS — J984 Other disorders of lung: Secondary | ICD-10-CM

## 2013-09-17 DIAGNOSIS — E039 Hypothyroidism, unspecified: Secondary | ICD-10-CM

## 2013-09-17 DIAGNOSIS — R0609 Other forms of dyspnea: Secondary | ICD-10-CM | POA: Insufficient documentation

## 2013-09-17 DIAGNOSIS — R059 Cough, unspecified: Secondary | ICD-10-CM

## 2013-09-17 DIAGNOSIS — H00013 Hordeolum externum right eye, unspecified eyelid: Secondary | ICD-10-CM

## 2013-09-17 DIAGNOSIS — H00019 Hordeolum externum unspecified eye, unspecified eyelid: Secondary | ICD-10-CM | POA: Insufficient documentation

## 2013-09-17 LAB — CBC WITH DIFFERENTIAL/PLATELET
BASOS ABS: 0 10*3/uL (ref 0.0–0.1)
Basophils Relative: 0.4 % (ref 0.0–3.0)
Eosinophils Absolute: 0.2 10*3/uL (ref 0.0–0.7)
Eosinophils Relative: 2.1 % (ref 0.0–5.0)
HCT: 34.9 % — ABNORMAL LOW (ref 36.0–46.0)
HEMOGLOBIN: 11.6 g/dL — AB (ref 12.0–15.0)
LYMPHS PCT: 31.3 % (ref 12.0–46.0)
Lymphs Abs: 2.4 10*3/uL (ref 0.7–4.0)
MCHC: 33.2 g/dL (ref 30.0–36.0)
MCV: 87.8 fl (ref 78.0–100.0)
MONOS PCT: 8.9 % (ref 3.0–12.0)
Monocytes Absolute: 0.7 10*3/uL (ref 0.1–1.0)
NEUTROS PCT: 57.3 % (ref 43.0–77.0)
Neutro Abs: 4.5 10*3/uL (ref 1.4–7.7)
Platelets: 330 10*3/uL (ref 150.0–400.0)
RBC: 3.97 Mil/uL (ref 3.87–5.11)
RDW: 15 % (ref 11.5–15.5)
WBC: 7.8 10*3/uL (ref 4.0–10.5)

## 2013-09-17 LAB — CARDIAC PANEL
CK MB: 1.8 ng/mL (ref 0.3–4.0)
CK TOTAL: 108 U/L (ref 7–177)
RELATIVE INDEX: 1.7 calc (ref 0.0–2.5)

## 2013-09-17 LAB — TSH: TSH: 2.26 u[IU]/mL (ref 0.35–4.50)

## 2013-09-17 LAB — BASIC METABOLIC PANEL
BUN: 25 mg/dL — ABNORMAL HIGH (ref 6–23)
CHLORIDE: 102 meq/L (ref 96–112)
CO2: 31 mEq/L (ref 19–32)
Calcium: 9.5 mg/dL (ref 8.4–10.5)
Creatinine, Ser: 1 mg/dL (ref 0.4–1.2)
GFR: 68.07 mL/min (ref >60.00)
Glucose, Bld: 115 mg/dL — ABNORMAL HIGH (ref 70–99)
POTASSIUM: 3.6 meq/L (ref 3.5–5.1)
Sodium: 138 mEq/L (ref 135–145)

## 2013-09-17 LAB — HEMOGLOBIN A1C: HEMOGLOBIN A1C: 6.8 % — AB (ref 4.6–6.5)

## 2013-09-17 LAB — BRAIN NATRIURETIC PEPTIDE: Pro B Natriuretic peptide (BNP): 74 pg/mL (ref 0.0–100.0)

## 2013-09-17 MED ORDER — TOBRAMYCIN-DEXAMETHASONE 0.3-0.1 % OP OINT: 1.0000 "application " | TOPICAL_OINTMENT | Freq: Three times a day (TID) | OPHTHALMIC | Status: DC

## 2013-09-17 NOTE — Progress Notes (Signed)
Pre visit review using our clinic review tool, if applicable. No additional management support is needed unless otherwise documented below in the visit note. 

## 2013-09-17 NOTE — Progress Notes (Signed)
Subjective:    Patient ID: [name], female    DOB: 11/21/1940, 73 y.o.   MRN: [id number]  Hypertension This is a chronic problem. The current episode started more than 1 year ago. The problem has been gradually improving since onset. The problem is controlled. Associated symptoms include anxiety and shortness of breath (she has developed some DOE). Pertinent negatives include no blurred vision, chest pain, headaches, malaise/fatigue, neck pain, orthopnea, palpitations, peripheral edema, PND or sweats. There are no associated agents to hypertension. Past treatments include calcium channel blockers, angiotensin blockers and diuretics. Hypertensive end-organ damage includes a thyroid problem. Identifiable causes of hypertension include sleep apnea.      Review of Systems  Constitutional: Negative.  Negative for fever, chills, malaise/fatigue, diaphoresis, appetite change and fatigue.  HENT: Positive for congestion, postnasal drip and rhinorrhea. Negative for nosebleeds, sinus pressure, sneezing, sore throat, trouble swallowing and voice change.   Eyes: Negative.  Negative for blurred vision, photophobia, pain, discharge, redness, itching and visual disturbance.       Her right lower eyelid has been red and swollen for a few days  Respiratory: Positive for cough (she has a mild intermittent NP cough) and shortness of breath (she has developed some DOE). Negative for apnea, choking, chest tightness, wheezing and stridor.   Cardiovascular: Negative.  Negative for chest pain, palpitations, orthopnea, leg swelling and PND.  Gastrointestinal: Negative.  Negative for nausea, vomiting, abdominal pain, diarrhea, constipation and blood in stool.  Endocrine: Negative.  Negative for polydipsia, polyphagia and polyuria.  Genitourinary: Negative.  Negative for dysuria and frequency.  Musculoskeletal: Negative.  Negative for arthralgias, back pain, joint swelling, myalgias and neck pain.  Skin: Negative.   Negative for rash.  Allergic/Immunologic: Negative.   Neurological: Negative.  Negative for dizziness, tremors, seizures, syncope, facial asymmetry, speech difficulty, weakness, light-headedness, numbness and headaches.  Hematological: Negative.  Negative for adenopathy. Does not bruise/bleed easily.  Psychiatric/Behavioral: Negative.        Objective:   Physical Exam  Vitals reviewed. Constitutional: She is oriented to person, place, and time. She appears well-developed and well-nourished. No distress.  HENT:  Nose: No mucosal edema, rhinorrhea, sinus tenderness or nasal septal hematoma. Right sinus exhibits no maxillary sinus tenderness and no frontal sinus tenderness. Left sinus exhibits no maxillary sinus tenderness and no frontal sinus tenderness.  Mouth/Throat: Oropharynx is clear and moist and mucous membranes are normal. Mucous membranes are not pale, not dry and not cyanotic. No oral lesions. No trismus in the jaw. No uvula swelling. No oropharyngeal exudate, posterior oropharyngeal edema, posterior oropharyngeal erythema or tonsillar abscesses.  Eyes: Conjunctivae and EOM are normal. Pupils are equal, round, and reactive to light. Right eye exhibits hordeolum. Right eye exhibits no chemosis, no discharge and no exudate. No foreign body present in the right eye. Left eye exhibits no chemosis, no discharge, no exudate and no hordeolum. No foreign body present in the left eye. Right conjunctiva is not injected. Right conjunctiva has no hemorrhage. Left conjunctiva is not injected. Left conjunctiva has no hemorrhage. Right eye exhibits normal extraocular motion and no nystagmus. Left eye exhibits normal extraocular motion and no nystagmus. Right pupil is round and reactive. Left pupil is round and reactive. Pupils are equal.    Neck: Normal range of motion. Neck supple. No JVD present. No tracheal deviation present. No thyromegaly present.  Cardiovascular: Normal rate, regular rhythm,  normal heart sounds and intact distal pulses.  Exam reveals no gallop and no friction rub.  No murmur heard. Pulmonary/Chest: Effort normal and breath sounds normal. No stridor. No respiratory distress. She has no wheezes. She has no rales. She exhibits no tenderness.  Abdominal: Soft. Bowel sounds are normal. She exhibits no distension and no mass. There is no tenderness. There is no rebound and no guarding.  Musculoskeletal: Normal range of motion. She exhibits no edema and no tenderness.  Lymphadenopathy:    She has no cervical adenopathy.  Neurological: She is oriented to person, place, and time.  Skin: Skin is warm and dry. No rash noted. She is not diaphoretic. No erythema. No pallor.  Psychiatric: She has a normal mood and affect. Her behavior is normal. Judgment and thought content normal.     Lab Results  Component Value Date   WBC 9.6 03/27/2012   HGB 12.4 03/27/2012   HCT 37.3 03/27/2012   PLT 368.0 03/27/2012   GLUCOSE 113* 06/25/2013   CHOL 213* 06/25/2013   TRIG 112.0 06/25/2013   HDL 56.00 06/25/2013   LDLDIRECT 120.6 11/24/2012   LDLCALC 135* 06/25/2013   ALT 32 11/24/2012   ALT 32 11/24/2012   AST 27 11/24/2012   AST 27 11/24/2012   NA 140 06/25/2013   K 3.3* 06/25/2013   CL 102 06/25/2013   CREATININE 0.7 06/25/2013   BUN 20 06/25/2013   CO2 31 06/25/2013   TSH 0.91 06/25/2013   INR 1.0 10/01/2011   HGBA1C 6.8* 06/25/2013       Assessment & Plan:

## 2013-09-17 NOTE — Patient Instructions (Signed)
Cough, Adult  A cough is a reflex that helps clear your throat and airways. It can help heal the body or may be a reaction to an irritated airway. A cough may only last 2 or 3 weeks (acute) or may last more than 8 weeks (chronic).  CAUSES Acute cough:  Viral or bacterial infections. Chronic cough:  Infections.  Allergies.  Asthma.  Post-nasal drip.  Smoking.  Heartburn or acid reflux.  Some medicines.  Chronic lung problems (COPD).  Cancer. SYMPTOMS   Cough.  Fever.  Chest pain.  Increased breathing rate.  High-pitched whistling sound when breathing (wheezing).  Colored mucus that you cough up (sputum). TREATMENT   A bacterial cough may be treated with antibiotic medicine.  A viral cough must run its course and will not respond to antibiotics.  Your caregiver may recommend other treatments if you have a chronic cough. HOME CARE INSTRUCTIONS   Only take over-the-counter or prescription medicines for pain, discomfort, or fever as directed by your caregiver. Use cough suppressants only as directed by your caregiver.  Use a cold steam vaporizer or humidifier in your bedroom or home to help loosen secretions.  Sleep in a semi-upright position if your cough is worse at night.  Rest as needed.  Stop smoking if you smoke. SEEK IMMEDIATE MEDICAL CARE IF:   You have pus in your sputum.  Your cough starts to worsen.  You cannot control your cough with suppressants and are losing sleep.  You begin coughing up blood.  You have difficulty breathing.  You develop pain which is getting worse or is uncontrolled with medicine.  You have a fever. MAKE SURE YOU:   Understand these instructions.  Will watch your condition.  Will get help right away if you are not doing well or get worse. Document Released: 09/01/2010 Document Revised: 05/28/2011 Document Reviewed: 09/01/2010 Exit[employer] Patient Information 2015 [employer]. This information is not intended  to replace advice given to you by your health care provider. Make sure you discuss any questions you have with your health care provider.  

## 2013-09-18 LAB — TROPONIN I

## 2013-09-19 ENCOUNTER — Encounter: Payer: Self-pay | Admitting: Internal Medicine

## 2013-09-19 NOTE — Assessment & Plan Note (Signed)
Will treat with tobradex

## 2013-09-19 NOTE — Assessment & Plan Note (Signed)
I will recheck her CXR to see if there is a concern about this

## 2013-09-19 NOTE — Assessment & Plan Note (Signed)
Her EKG is normal, Her cardiac enzymes and BNP are normal I am concerned she may have DD so I have asked her to have an ECHO done IF the ECHO is normal then will consider this DOE to be related to her weight and poor conditioning

## 2013-09-19 NOTE — Assessment & Plan Note (Signed)
Her TSH is on the normal range She will stay on the current dose 

## 2013-09-19 NOTE — Assessment & Plan Note (Signed)
Her BP is well controlled Her lytes and renal function are stable 

## 2013-09-19 NOTE — Assessment & Plan Note (Signed)
Her CXR is normal, I think this is related to her allergies

## 2013-09-19 NOTE — Assessment & Plan Note (Signed)
Her blood sugars are well controlled 

## 2013-09-19 NOTE — Assessment & Plan Note (Signed)
Her prior EKG was abnormal Today it is normal

## 2013-10-15 ENCOUNTER — Telehealth: Payer: Self-pay | Admitting: *Deleted

## 2013-10-15 ENCOUNTER — Other Ambulatory Visit: Payer: Self-pay | Admitting: Internal Medicine

## 2013-10-15 ENCOUNTER — Telehealth: Payer: Self-pay | Admitting: Internal Medicine

## 2013-10-15 DIAGNOSIS — I10 Essential (primary) hypertension: Secondary | ICD-10-CM

## 2013-10-15 MED ORDER — NEBIVOLOL HCL 5 MG PO TABS: 5.0000 mg | ORAL_TABLET | Freq: Every day | ORAL | Status: DC

## 2013-10-15 MED ORDER — CARVEDILOL 3.125 MG PO TABS: 3.1250 mg | ORAL_TABLET | Freq: Two times a day (BID) | ORAL | Status: DC

## 2013-10-15 MED ORDER — INDAPAMIDE 2.5 MG PO TABS: 2.5000 mg | ORAL_TABLET | Freq: Every day | ORAL | Status: DC

## 2013-10-15 NOTE — Telephone Encounter (Signed)
Pt states that her blood pressure readings were follows: Yesterday 4:50 am 130/85 9:00- 147/94 11:00 -150/101 2 pm-134/87  Today 5 :42 am-117/80 10:10-150/92  Pt states that through out the day her bp readings are elevated.  Pt c/o headaches, pain in neck that radiates down both shoulders.  Please advise

## 2013-10-15 NOTE — Telephone Encounter (Signed)
Pt request a phone call from the assistant concern about her BP. Please give pt a call

## 2013-10-15 NOTE — Telephone Encounter (Signed)
done

## 2013-10-15 NOTE — Telephone Encounter (Signed)
Pharmacist (christy) left msg on triage stating the bystolic md rx today is too expensive for pt. The copay is $45 for 30 days pt states she can't afford. Requesting md to rx something else. She has carvedilol & metoprolol on her plan and is a lot cheaper....[name]

## 2013-10-15 NOTE — Telephone Encounter (Signed)
I have raised the dose on her diuretic and have added bystolic, ask her to return soon for a BP check

## 2013-10-16 NOTE — Telephone Encounter (Signed)
Attempted to call back, unable to leave a message

## 2013-10-16 NOTE — Telephone Encounter (Signed)
Md sent new med electronically...Johny Chess

## 2013-10-21 ENCOUNTER — Ambulatory Visit (INDEPENDENT_AMBULATORY_CARE_PROVIDER_SITE_OTHER): Payer: Medicare HMO | Admitting: Ophthalmology

## 2013-10-22 NOTE — Telephone Encounter (Signed)
lmtcb

## 2013-10-22 NOTE — Telephone Encounter (Signed)
Patient returned phone call.  She would like you to call back.  She does not believe diuretic was increased.  Scheduled BP check.

## 2013-10-26 ENCOUNTER — Telehealth: Payer: Self-pay

## 2013-10-26 DIAGNOSIS — E1165 Type 2 diabetes mellitus with hyperglycemia: Principal | ICD-10-CM

## 2013-10-26 DIAGNOSIS — IMO0001 Reserved for inherently not codable concepts without codable children: Secondary | ICD-10-CM

## 2013-10-26 MED ORDER — GLUCOSE BLOOD VI STRP: ORAL_STRIP | Status: DC

## 2013-10-26 NOTE — Telephone Encounter (Signed)
Need new rx for test strips, [employer] covers accu-chek, prodigy, truetest, truetrack , please advise

## 2013-10-26 NOTE — Telephone Encounter (Signed)
done

## 2013-10-28 ENCOUNTER — Encounter (INDEPENDENT_AMBULATORY_CARE_PROVIDER_SITE_OTHER): Payer: Commercial Managed Care - HMO

## 2013-11-02 ENCOUNTER — Ambulatory Visit (HOSPITAL_COMMUNITY)
Admission: RE | Admit: 2013-11-02 | Discharge: 2013-11-02 | Disposition: A | Discharge status: Home / Self Care | Payer: Medicare HMO | Source: Ambulatory Visit | Attending: Cardiology | Admitting: Cardiology

## 2013-11-02 DIAGNOSIS — R0602 Shortness of breath: Secondary | ICD-10-CM

## 2013-11-02 DIAGNOSIS — R9431 Abnormal electrocardiogram [ECG] [EKG]: Secondary | ICD-10-CM

## 2013-11-02 DIAGNOSIS — R0989 Other specified symptoms and signs involving the circulatory and respiratory systems: Principal | ICD-10-CM | POA: Insufficient documentation

## 2013-11-02 DIAGNOSIS — R0609 Other forms of dyspnea: Secondary | ICD-10-CM | POA: Diagnosis present

## 2013-11-02 NOTE — Progress Notes (Signed)
2D Echo Performed 11/02/2013    Marygrace Drought, RCS

## 2013-11-03 ENCOUNTER — Encounter: Payer: Self-pay | Admitting: Internal Medicine

## 2013-11-06 ENCOUNTER — Encounter: Payer: Self-pay | Admitting: Internal Medicine

## 2013-11-06 ENCOUNTER — Ambulatory Visit (INDEPENDENT_AMBULATORY_CARE_PROVIDER_SITE_OTHER): Payer: Commercial Managed Care - HMO | Admitting: Internal Medicine

## 2013-11-06 VITALS — BP 120/80 | HR 69 | Temp 97.7°F | Resp 16 | Ht <= 58 in

## 2013-11-06 DIAGNOSIS — E1165 Type 2 diabetes mellitus with hyperglycemia: Secondary | ICD-10-CM

## 2013-11-06 DIAGNOSIS — F509 Eating disorder, unspecified: Secondary | ICD-10-CM

## 2013-11-06 DIAGNOSIS — I10 Essential (primary) hypertension: Secondary | ICD-10-CM

## 2013-11-06 DIAGNOSIS — IMO0001 Reserved for inherently not codable concepts without codable children: Secondary | ICD-10-CM

## 2013-11-06 DIAGNOSIS — E039 Hypothyroidism, unspecified: Secondary | ICD-10-CM

## 2013-11-06 DIAGNOSIS — F5081 Binge eating disorder: Secondary | ICD-10-CM | POA: Insufficient documentation

## 2013-11-06 MED ORDER — TOPIRAMATE 25 MG PO CPSP: 25.0000 mg | ORAL_CAPSULE | Freq: Two times a day (BID) | ORAL | Status: DC

## 2013-11-06 NOTE — Progress Notes (Signed)
Pre visit review using our clinic review tool, if applicable. No additional management support is needed unless otherwise documented below in the visit note. 

## 2013-11-06 NOTE — Patient Instructions (Signed)

## 2013-11-10 ENCOUNTER — Encounter: Payer: Self-pay | Admitting: Internal Medicine

## 2013-11-10 NOTE — Assessment & Plan Note (Signed)
Her last TSH was in the normal range She will stay on the current dose

## 2013-11-10 NOTE — Assessment & Plan Note (Signed)
Her blood sugars are well controlled 

## 2013-11-10 NOTE — Progress Notes (Signed)
   Subjective:    Patient ID: [name], female    DOB: [birth year], 73 y.o.   MRN: [id number]  HPI Comments: Today she complains of weight gain and binge eating, wants to try a medication for this.     Review of Systems  Constitutional: Positive for unexpected weight change. Negative for fever, chills, diaphoresis, appetite change and fatigue.  HENT: Negative.   Eyes: Negative.   Respiratory: Negative.  Negative for cough, choking, chest tightness, shortness of breath and stridor.   Cardiovascular: Negative.  Negative for chest pain, palpitations and leg swelling.  Gastrointestinal: Negative.  Negative for nausea, vomiting, abdominal pain, diarrhea, constipation and blood in stool.  Endocrine: Negative.   Genitourinary: Negative.  Negative for difficulty urinating.  Musculoskeletal: Negative.  Negative for arthralgias, back pain, joint swelling and myalgias.  Skin: Negative.  Negative for rash.  Allergic/Immunologic: Negative.   Neurological: Negative.   Hematological: Negative.  Negative for adenopathy. Does not bruise/bleed easily.  Psychiatric/Behavioral: Negative.        Objective:   Physical Exam  Vitals reviewed. Constitutional: She is oriented to person, place, and time. She appears well-developed and well-nourished. No distress.  HENT:  Head: Normocephalic and atraumatic.  Mouth/Throat: Oropharynx is clear and moist. No oropharyngeal exudate.  Eyes: Conjunctivae are normal. Right eye exhibits no discharge. Left eye exhibits no discharge. No scleral icterus.  Neck: Normal range of motion. Neck supple. No JVD present. No tracheal deviation present. No thyromegaly present.  Cardiovascular: Normal rate, regular rhythm, normal heart sounds and intact distal pulses.  Exam reveals no gallop and no friction rub.   No murmur heard. Pulmonary/Chest: Effort normal and breath sounds normal. No stridor. No respiratory distress. She has no wheezes. She has no rales. She exhibits  no tenderness.  Abdominal: Soft. Bowel sounds are normal. She exhibits no distension and no mass. There is no tenderness. There is no rebound and no guarding.  Musculoskeletal: Normal range of motion. She exhibits no edema and no tenderness.  Lymphadenopathy:    She has no cervical adenopathy.  Neurological: She is oriented to person, place, and time.  Skin: Skin is warm and dry. No rash noted. She is not diaphoretic. No erythema. No pallor.  Psychiatric: She has a normal mood and affect. Her behavior is normal. Judgment and thought content normal.          Assessment & Plan:

## 2013-11-10 NOTE — Assessment & Plan Note (Signed)
Her BP is well controlled 

## 2013-11-10 NOTE — Assessment & Plan Note (Signed)
She will try topiramate for this

## 2013-11-13 ENCOUNTER — Ambulatory Visit (INDEPENDENT_AMBULATORY_CARE_PROVIDER_SITE_OTHER): Payer: Commercial Managed Care - HMO | Admitting: Ophthalmology

## 2013-11-13 DIAGNOSIS — H35039 Hypertensive retinopathy, unspecified eye: Secondary | ICD-10-CM

## 2013-11-13 DIAGNOSIS — H35379 Puckering of macula, unspecified eye: Secondary | ICD-10-CM

## 2013-11-13 DIAGNOSIS — E11319 Type 2 diabetes mellitus with unspecified diabetic retinopathy without macular edema: Secondary | ICD-10-CM

## 2013-11-13 DIAGNOSIS — E1165 Type 2 diabetes mellitus with hyperglycemia: Secondary | ICD-10-CM

## 2013-11-13 DIAGNOSIS — I10 Essential (primary) hypertension: Secondary | ICD-10-CM

## 2013-11-13 DIAGNOSIS — H43819 Vitreous degeneration, unspecified eye: Secondary | ICD-10-CM

## 2013-11-13 DIAGNOSIS — E1139 Type 2 diabetes mellitus with other diabetic ophthalmic complication: Secondary | ICD-10-CM

## 2013-12-15 ENCOUNTER — Other Ambulatory Visit: Payer: Self-pay

## 2013-12-15 DIAGNOSIS — Z1231 Encounter for screening mammogram for malignant neoplasm of breast: Secondary | ICD-10-CM

## 2013-12-17 ENCOUNTER — Telehealth: Payer: Self-pay | Admitting: Internal Medicine

## 2013-12-17 NOTE — Telephone Encounter (Signed)
Pt request phone call concern medication that need to be change, pt request to speak the assistant only. Please give her a call.

## 2013-12-25 ENCOUNTER — Other Ambulatory Visit: Payer: Self-pay

## 2013-12-25 DIAGNOSIS — E876 Hypokalemia: Secondary | ICD-10-CM

## 2013-12-25 DIAGNOSIS — E039 Hypothyroidism, unspecified: Secondary | ICD-10-CM

## 2013-12-25 DIAGNOSIS — I10 Essential (primary) hypertension: Secondary | ICD-10-CM

## 2013-12-25 MED ORDER — CARVEDILOL 3.125 MG PO TABS: 3.1250 mg | ORAL_TABLET | Freq: Two times a day (BID) | ORAL | Status: DC

## 2013-12-25 MED ORDER — LOSARTAN POTASSIUM 100 MG PO TABS: 100.0000 mg | ORAL_TABLET | Freq: Every day | ORAL | Status: DC

## 2013-12-25 MED ORDER — POTASSIUM CHLORIDE CRYS ER 20 MEQ PO TBCR: 20.0000 meq | EXTENDED_RELEASE_TABLET | Freq: Every day | ORAL | Status: DC

## 2013-12-25 MED ORDER — LEVOTHYROXINE SODIUM 75 MCG PO TABS: 75.0000 ug | ORAL_TABLET | Freq: Every day | ORAL | Status: DC

## 2013-12-25 MED ORDER — FLUOXETINE HCL 20 MG PO CAPS: ORAL_CAPSULE | ORAL | Status: DC

## 2013-12-25 MED ORDER — INDAPAMIDE 2.5 MG PO TABS: 2.5000 mg | ORAL_TABLET | Freq: Every day | ORAL | Status: DC

## 2013-12-28 ENCOUNTER — Telehealth: Payer: Self-pay | Admitting: Internal Medicine

## 2013-12-28 NOTE — Telephone Encounter (Signed)
Pt called to see if Dr. Ronnald Ramp got the letter that she sent last week for her medication change. Please advise.

## 2013-12-28 NOTE — Telephone Encounter (Signed)
Already done last week

## 2014-02-10 ENCOUNTER — Ambulatory Visit: Payer: Commercial Managed Care - HMO

## 2014-02-16 ENCOUNTER — Encounter: Payer: Self-pay | Admitting: Family

## 2014-02-16 ENCOUNTER — Ambulatory Visit (INDEPENDENT_AMBULATORY_CARE_PROVIDER_SITE_OTHER): Payer: Commercial Managed Care - HMO | Admitting: Family

## 2014-02-16 VITALS — BP 142/68 | HR 75 | Temp 98.0°F | Resp 18 | Ht <= 58 in

## 2014-02-16 DIAGNOSIS — R059 Cough, unspecified: Secondary | ICD-10-CM | POA: Insufficient documentation

## 2014-02-16 DIAGNOSIS — R05 Cough: Secondary | ICD-10-CM | POA: Insufficient documentation

## 2014-02-16 MED ORDER — DOXYCYCLINE HYCLATE 100 MG PO TABS: 100.0000 mg | ORAL_TABLET | Freq: Two times a day (BID) | ORAL | Status: DC

## 2014-02-16 NOTE — Progress Notes (Signed)
Subjective:    Patient ID: [name], female    DOB: 10-02-1940, 73 y.o.   MRN: [id number]  Chief Complaint  Patient presents with  . Cough    Cough, runny nose, dizziness, x2 days   HPI:  [name] is a 73 y.o. female who presents today for cough.    Acute symptoms of productive cough (phelgm - some blood tinged), runny nose and dizziness started 2 days ago. Has had sinus headache and sinus pressure. Has had some sweats, but denies fever. Has tried Delsym and sinus headache medication with minimal relief.   Allergies  Allergen Reactions  . Amoxicillin Other (See Comments)    GI Issues.  . Codeine Sulfate   . Hydrochlorothiazide   . Iodine   . Iohexol      Code: HIVES, Desc: PATIENT STATES SHE BREAKS OUT IN HIVES FROM IV DYE 02/27/08/RM, Onset Date: 42595638   . Paroxetine   . Penicillins   . Prednisone    Current Outpatient Prescriptions on File Prior to Visit  Medication Sig Dispense Refill  . ALPRAZolam (XANAX) 0.25 MG tablet Take 0.25 mg by mouth at bedtime as needed for sleep.    Marland Kitchen aspirin 81 MG tablet Take 81 mg by mouth daily.    . Calcium Carbonate-Vitamin D (CALCIUM 600+D) 600-200 MG-UNIT TABS Take by mouth daily.      . carvedilol (COREG) 3.125 MG tablet Take 1 tablet (3.125 mg total) by mouth 2 (two) times daily with a meal. 180 tablet 3  . Cinnamon 500 MG TABS Take by mouth daily.      Marland Kitchen diltiazem (DILT-CD) 180 MG 24 hr capsule Take 1 capsule (180 mg total) by mouth every evening. 90 capsule 3  . FLUoxetine (PROZAC) 20 MG capsule TAKE 1 CAPSULE AT BEDTIME 90 capsule 3  . Ginkgo Biloba 40 MG TABS Take by mouth daily.      Marland Kitchen glucose blood (ACCU-CHEK ACTIVE STRIPS) test strip Use BID 100 each 12  . indapamide (LOZOL) 2.5 MG tablet Take 1 tablet (2.5 mg total) by mouth daily. 90 tablet 1  . levothyroxine (SYNTHROID) 75 MCG tablet Take 1 tablet (75 mcg total) by mouth daily. 90 tablet 3  . losartan (COZAAR) 100 MG tablet Take 1 tablet (100 mg total) by  mouth daily. 90 tablet 3  . Multiple Vitamins-Minerals (CENTRUM SILVER PO) Take by mouth daily.      . ONE TOUCH LANCETS MISC Use with One Touch Verio monitor as directed Twice Daily to test blood glucose levels. DX: 250.60 200 each 5  . potassium chloride SA (KLOR-CON M20) 20 MEQ tablet Take 1 tablet (20 mEq total) by mouth daily. 90 tablet 3  . prednisoLONE acetate (PRED FORTE) 1 % ophthalmic suspension Place 1 drop into the right eye.    . Probiotic Product (PROBIOTIC & ACIDOPHILUS EX ST PO) Take 1 tablet by mouth daily.    . Red Yeast Rice 600 MG CAPS Take 1 capsule (600 mg total) by mouth daily.    Marland Kitchen tobramycin-dexamethasone (TOBRADEX) ophthalmic ointment Place 1 application into the right eye 3 (three) times daily. 3.5 g 0  . topiramate (TOPAMAX) 25 MG capsule Take 1 capsule (25 mg total) by mouth 2 (two) times daily. 30 capsule 5  . UNABLE TO FIND ACCULAR 0.5%--RIGHT EYE     No current facility-administered medications on file prior to visit.    Review of Systems    See HPI  Objective:    BP 142/68 mmHg  Pulse 75  Temp(Src) 98 F (36.7 C) (Oral)  Resp 18  Ht 4\' 10"  (1.473 m)  Wt   SpO2 97% Nursing note and vital signs reviewed.  Physical Exam  Constitutional: She is oriented to person, place, and time. She appears well-developed and well-nourished. No distress.  HENT:  Right Ear: Hearing, tympanic membrane, external ear and ear canal normal.  Left Ear: Hearing, tympanic membrane, external ear and ear canal normal.  Nose: Right sinus exhibits maxillary sinus tenderness and frontal sinus tenderness. Left sinus exhibits maxillary sinus tenderness and frontal sinus tenderness.  Mouth/Throat: Uvula is midline, oropharynx is clear and moist and mucous membranes are normal.  Cardiovascular: Normal rate, regular rhythm, normal heart sounds and intact distal pulses.   Pulmonary/Chest: Effort normal and breath sounds normal.  Lymphadenopathy:    She has no cervical adenopathy.    Neurological: She is alert and oriented to person, place, and time.  Skin: Skin is warm and dry.  Psychiatric: She has a normal mood and affect. Her behavior is normal. Judgment and thought content normal.       Assessment & Plan:

## 2014-02-16 NOTE — Progress Notes (Signed)
Pre visit review using our clinic review tool, if applicable. No additional management support is needed unless otherwise documented below in the visit note. 

## 2014-02-16 NOTE — Patient Instructions (Signed)
Thank you for choosing [facility].  Summary/Instructions:  Your prescription(s) have been submitted to your pharmacy. Please take as directed and contact our office if you believe you are having problem(s) with the medication(s).  If your symptoms worsen or fail to improve, please contact our office for further instruction, or in case of emergency go directly to the emergency room at the closest medical facility.   Sinusitis Sinusitis is redness, soreness, and inflammation of the paranasal sinuses. Paranasal sinuses are air pockets within the bones of your face (beneath the eyes, the middle of the forehead, or above the eyes). In healthy paranasal sinuses, mucus is able to drain out, and air is able to circulate through them by way of your nose. However, when your paranasal sinuses are inflamed, mucus and air can become trapped. This can allow bacteria and other germs to grow and cause infection. Sinusitis can develop quickly and last only a short time (acute) or continue over a long period (chronic). Sinusitis that lasts for more than 12 weeks is considered chronic.  CAUSES  Causes of sinusitis include:  Allergies.  Structural abnormalities, such as displacement of the cartilage that separates your nostrils (deviated septum), which can decrease the air flow through your nose and sinuses and affect sinus drainage.  Functional abnormalities, such as when the small hairs (cilia) that line your sinuses and help remove mucus do not work properly or are not present. SIGNS AND SYMPTOMS  Symptoms of acute and chronic sinusitis are the same. The primary symptoms are pain and pressure around the affected sinuses. Other symptoms include:  Upper toothache.  Earache.  Headache.  Bad breath.  Decreased sense of smell and taste.  A cough, which worsens when you are lying flat.  Fatigue.  Fever.  Thick drainage from your nose, which often is green and may contain pus  (purulent).  Swelling and warmth over the affected sinuses. DIAGNOSIS  Your health care provider will perform a physical exam. During the exam, your health care provider may:  Look in your nose for signs of abnormal growths in your nostrils (nasal polyps).  Tap over the affected sinus to check for signs of infection.  View the inside of your sinuses (endoscopy) using an imaging device that has a light attached (endoscope). If your health care provider suspects that you have chronic sinusitis, one or more of the following tests may be recommended:  Allergy tests.  Nasal culture. A sample of mucus is taken from your nose, sent to a lab, and screened for bacteria.  Nasal cytology. A sample of mucus is taken from your nose and examined by your health care provider to determine if your sinusitis is related to an allergy. TREATMENT  Most cases of acute sinusitis are related to a viral infection and will resolve on their own within 10 days. Sometimes medicines are prescribed to help relieve symptoms (pain medicine, decongestants, nasal steroid sprays, or saline sprays).  However, for sinusitis related to a bacterial infection, your health care provider will prescribe antibiotic medicines. These are medicines that will help kill the bacteria causing the infection.  Rarely, sinusitis is caused by a fungal infection. In theses cases, your health care provider will prescribe antifungal medicine. For some cases of chronic sinusitis, surgery is needed. Generally, these are cases in which sinusitis recurs more than 3 times per year, despite other treatments. HOME CARE INSTRUCTIONS   Drink plenty of water. Water helps thin the mucus so your sinuses can drain more easily.  Use a humidifier.  Inhale steam 3 to 4 times a day (for example, sit in the bathroom with the shower running).  Apply a warm, moist washcloth to your face 3 to 4 times a day, or as directed by your health care provider.  Use  saline nasal sprays to help moisten and clean your sinuses.  Take medicines only as directed by your health care provider.  If you were prescribed either an antibiotic or antifungal medicine, finish it all even if you start to feel better. SEEK IMMEDIATE MEDICAL CARE IF:  You have increasing pain or severe headaches.  You have nausea, vomiting, or drowsiness.  You have swelling around your face.  You have vision problems.  You have a stiff neck.  You have difficulty breathing. MAKE SURE YOU:   Understand these instructions.  Will watch your condition.  Will get help right away if you are not doing well or get worse. Document Released: 03/05/2005 Document Revised: 07/20/2013 Document Reviewed: 03/20/2011 ExitCare Patient Information 2015 [employer]. This information is not intended to replace advice given to you by your health care provider. Make sure you discuss any questions you have with your health care provider.  

## 2014-02-16 NOTE — Assessment & Plan Note (Signed)
Symptoms and exam consistent with sinusitis. Cannot rule out potential viral cause secondary to early this her presentation. Continue over-the-counter medications as needed for symptom relief. If no improvement within the next 3-5 days, start Doxycycline 100 mg for 10 days. Follow-up if symptoms worsen or fail to improve.

## 2014-03-05 ENCOUNTER — Ambulatory Visit
Admission: RE | Admit: 2014-03-05 | Discharge: 2014-03-05 | Disposition: A | Discharge status: Home / Self Care | Payer: Commercial Managed Care - HMO | Source: Ambulatory Visit

## 2014-03-05 DIAGNOSIS — Z1231 Encounter for screening mammogram for malignant neoplasm of breast: Secondary | ICD-10-CM

## 2014-03-08 LAB — HM MAMMOGRAPHY: HM Mammogram: NORMAL

## 2014-04-20 DIAGNOSIS — G4733 Obstructive sleep apnea (adult) (pediatric): Secondary | ICD-10-CM | POA: Diagnosis not present

## 2014-05-11 ENCOUNTER — Other Ambulatory Visit: Payer: Self-pay | Admitting: Internal Medicine

## 2014-05-11 ENCOUNTER — Telehealth: Payer: Self-pay | Admitting: Internal Medicine

## 2014-05-11 DIAGNOSIS — N760 Acute vaginitis: Secondary | ICD-10-CM | POA: Insufficient documentation

## 2014-05-11 MED ORDER — TRIAMCINOLONE ACETONIDE 0.5 % EX CREA: 1.0000 "application " | TOPICAL_CREAM | Freq: Three times a day (TID) | CUTANEOUS | Status: DC

## 2014-05-11 NOTE — Telephone Encounter (Signed)
Patient would like a referral for a Gynecologist in her network, preferably a female. She needs this as soon as possible.

## 2014-05-11 NOTE — Telephone Encounter (Signed)
What is the diagnosis? 

## 2014-05-11 NOTE — Telephone Encounter (Signed)
Pt is wanting md to rx something for the itching until she see gynecologist.../lmb

## 2014-05-11 NOTE — Telephone Encounter (Signed)
Called pt she states she is having this vaginal itch & rash, no discharge. She has use cream over the counter but its not helping. She is wanting to know can md rx something for the itch until she see GYN...Johny Chess

## 2014-05-11 NOTE — Telephone Encounter (Signed)
Notified pt md sent cream resent to [facility], also once referral has been set-up will receive call from [facility] with appt info...[name]

## 2014-05-11 NOTE — Telephone Encounter (Signed)
Referral ordered

## 2014-05-11 NOTE — Telephone Encounter (Signed)
Try kenalog

## 2014-05-19 DIAGNOSIS — H40013 Open angle with borderline findings, low risk, bilateral: Secondary | ICD-10-CM | POA: Diagnosis not present

## 2014-05-19 DIAGNOSIS — H2000 Unspecified acute and subacute iridocyclitis: Secondary | ICD-10-CM | POA: Diagnosis not present

## 2014-05-19 DIAGNOSIS — H1013 Acute atopic conjunctivitis, bilateral: Secondary | ICD-10-CM | POA: Diagnosis not present

## 2014-05-19 DIAGNOSIS — H5711 Ocular pain, right eye: Secondary | ICD-10-CM | POA: Diagnosis not present

## 2014-05-29 ENCOUNTER — Ambulatory Visit (INDEPENDENT_AMBULATORY_CARE_PROVIDER_SITE_OTHER): Payer: Medicare Other | Admitting: Family Medicine

## 2014-05-29 ENCOUNTER — Encounter: Payer: Self-pay | Admitting: Family Medicine

## 2014-05-29 VITALS — BP 138/84 | HR 71 | Temp 98.0°F | Resp 16 | Ht <= 58 in

## 2014-05-29 DIAGNOSIS — I10 Essential (primary) hypertension: Secondary | ICD-10-CM

## 2014-05-29 MED ORDER — LORATADINE 10 MG PO TABS: 10.0000 mg | ORAL_TABLET | Freq: Every day | ORAL | Status: DC

## 2014-05-29 NOTE — Assessment & Plan Note (Signed)
Likely overtreated, stop K and indapamide.  Recheck with PCP in about 1 week.  D/w pt.   The other sx- post nasal gtt- likely incidental, okay to use OTC claritin, not claritin D.  D/w pt.  F/u prn.

## 2014-05-29 NOTE — Progress Notes (Signed)
Pre visit review using our clinic review tool, if applicable. No additional management support is needed unless otherwise documented below in the visit note.  2 weeks with ST and post nasal gtt.  Phlegm in the throat, noted every AM.  BP has been "fine" on home checks.  Sugar has been controlled on home checks.  Episodically dizzy recently, "lightheaded" episodically.  The room isn't spinning.    She has been cutting out carbs and has been working to lose weight.  She doesn't get lightheaded when laying down.  She'll get more sx if she gets up quickly.  No CP, not SOB.    Meds, vitals, and allergies reviewed.   ROS: See HPI.  Otherwise, noncontributory.  GEN: nad, alert and oriented HEENT: mucous membranes moist, tm wnl OP with mild cobblestoning NECK: supple w/o LA CV: rrr.  PULM: ctab, no inc wob ABD: soft, +bs EXT: no edema

## 2014-05-29 NOTE — Patient Instructions (Addendum)
Stop the indapamide and the potassium for now.  Schedule a follow up appointment with Dr. Ronnald Ramp to recheck your BP and blood tests.  Take claritin 10mg  a day for now.  That should help the drainage.

## 2014-06-07 ENCOUNTER — Telehealth: Payer: Self-pay | Admitting: Nurse Practitioner

## 2014-06-07 ENCOUNTER — Encounter: Payer: Self-pay | Admitting: Nurse Practitioner

## 2014-06-07 ENCOUNTER — Ambulatory Visit (INDEPENDENT_AMBULATORY_CARE_PROVIDER_SITE_OTHER): Payer: Medicare Other | Admitting: Nurse Practitioner

## 2014-06-07 VITALS — BP 120/80 | HR 69 | Temp 98.4°F | Ht <= 58 in

## 2014-06-07 DIAGNOSIS — B029 Zoster without complications: Secondary | ICD-10-CM | POA: Diagnosis not present

## 2014-06-07 MED ORDER — LIDOCAINE VISCOUS 2 % MT SOLN: 10.0000 mL | OROMUCOSAL | Status: DC | PRN

## 2014-06-07 MED ORDER — VALACYCLOVIR HCL 1 G PO TABS: 1000.0000 mg | ORAL_TABLET | Freq: Three times a day (TID) | ORAL | Status: DC

## 2014-06-07 NOTE — Telephone Encounter (Signed)
If this is shingles, She could give chicken pox to small children or to unborn children of pregnant women until her mouth sores heal. If this is another virus, she may be contagious for 2 weeks.

## 2014-06-07 NOTE — Progress Notes (Signed)
   Subjective:    Patient ID: [name], female    DOB: 12-06-1940, 74 y.o.   MRN: [id number]  Oral Pain  This is a new problem. The current episode started in the past 7 days. The problem occurs constantly. The problem has been gradually worsening. The pain is moderate. Pertinent negatives include no difficulty swallowing, facial pain, fever, oral bleeding, sinus pressure or thermal sensitivity. Associated symptoms comments: Sore on side of tongue & peeling inside mouth. Treatments tried: "kanka" The treatment provided mild relief.      Review of Systems  Constitutional: Negative for fever, chills and fatigue.  HENT: Positive for ear pain, mouth sores and sore throat. Negative for congestion, dental problem, postnasal drip, sinus pressure, tinnitus and voice change.   Musculoskeletal: Negative for myalgias.  Neurological: Negative for headaches.       Objective:   Physical Exam  Constitutional: She is oriented to person, place, and time. She appears well-developed and well-nourished. No distress.  HENT:  Head: Normocephalic and atraumatic.  Right Ear: External ear normal.  Mouth/Throat:    Eyes: Conjunctivae and EOM are normal. Pupils are equal, round, and reactive to light. Right eye exhibits no discharge. Left eye exhibits no discharge.  Milky ring around both iris  Neck: Normal range of motion. No thyromegaly present.  Cardiovascular: Normal rate, regular rhythm and normal heart sounds.   No murmur heard. Pulmonary/Chest: Effort normal and breath sounds normal.  Lymphadenopathy:    She has cervical adenopathy (L cervical anterior tender LAD).  Neurological: She is alert and oriented to person, place, and time.  Skin: Skin is warm and dry.  No blisters along hands, fingers  Psychiatric: She has a normal mood and affect. Her behavior is normal. Thought content normal.  Vitals reviewed.         Assessment & Plan:  1. Shingles DD: oral yeast, hand foot mouth -  valACYclovir (VALTREX) 1000 MG tablet; Take 1 tablet (1,000 mg total) by mouth 3 (three) times daily.  Dispense: 21 tablet; Refill: 0 - lidocaine (XYLOCAINE) 2 % solution; Use as directed 10 mLs in the mouth or throat as needed for mouth pain. Gargle & spit as needed for comfort.  Dispense: 100 mL; Refill: 0 Salt water rinses See pt instructions F/u 2 weeks

## 2014-06-07 NOTE — Telephone Encounter (Signed)
Pt came in for an appt today and she was wondering if she is contangious. Please call and okey to leave detail massage if no answer.

## 2014-06-07 NOTE — Patient Instructions (Signed)
I think this is shingles. Start valtrex. Salt water rinses twice daily (1/4 tsp salt mixed with 1/4 cup warm water.) Use lidocaine rinse for comfort.

## 2014-06-07 NOTE — Telephone Encounter (Signed)
Please advise 

## 2014-06-07 NOTE — Progress Notes (Signed)
Pre visit review using our clinic review tool, if applicable. No additional management support is needed unless otherwise documented below in the visit note. 

## 2014-06-08 ENCOUNTER — Ambulatory Visit (INDEPENDENT_AMBULATORY_CARE_PROVIDER_SITE_OTHER): Payer: Medicare Other | Admitting: Internal Medicine

## 2014-06-08 ENCOUNTER — Encounter: Payer: Self-pay | Admitting: Internal Medicine

## 2014-06-08 VITALS — BP 118/74 | HR 68 | Temp 98.5°F | Resp 16 | Ht <= 58 in

## 2014-06-08 DIAGNOSIS — I10 Essential (primary) hypertension: Secondary | ICD-10-CM | POA: Diagnosis not present

## 2014-06-08 DIAGNOSIS — K120 Recurrent oral aphthae: Secondary | ICD-10-CM | POA: Diagnosis not present

## 2014-06-08 MED ORDER — PREDNISOLONE 15 MG/5ML PO SOLN: 15.0000 mg | Freq: Every day | ORAL | Status: DC

## 2014-06-08 NOTE — Assessment & Plan Note (Signed)
I don't think this is a herpes infection so will stop the valtrex I think this is non-specific viral inflammation, will cont lidocaine swish and spit as needed but I also think she would benefit from a steroids swish and spit to reduce the pain and inflammation

## 2014-06-08 NOTE — Progress Notes (Signed)
Pre visit review using our clinic review tool, if applicable. No additional management support is needed unless otherwise documented below in the visit note. 

## 2014-06-08 NOTE — Assessment & Plan Note (Signed)
Her BP is well controlled 

## 2014-06-08 NOTE — Telephone Encounter (Signed)
Patient notified. Patient stated that the sores in her mouth and the swelling in her jaw has spread to the other side. Also her lips are more swollen then they were yesterday.

## 2014-06-08 NOTE — Patient Instructions (Signed)
Canker Sores  °Canker sores are painful, open sores on the inside of the mouth and cheek. They may be white or yellow. The sores usually heal in 1 to 2 weeks. Women are more likely than men to have recurrent canker sores. °CAUSES °The cause of canker sores is not well understood. More than one cause is likely. Canker sores do not appear to be caused by certain types of germs (viruses or bacteria). Canker sores may be caused by: °· An allergic reaction to certain foods. °· Digestive problems. °· Not having enough vitamin B12, folic acid, and iron. °· Female sex hormones. Sores may come only during certain phases of a menstrual cycle. Often, there is improvement during pregnancy. °· Genetics. Some people seem to inherit canker sore problems. °Emotional stress and injuries to the mouth may trigger outbreaks, but not cause them.  °DIAGNOSIS °Canker sores are diagnosed by exam.  °TREATMENT °· Patients who have frequent bouts of canker sores may have cultures taken of the sores, blood tests, or allergy tests. This helps determine if their sores are caused by a poor diet, an allergy, or some other preventable or treatable disease. °· Vitamins may prevent recurrences or reduce the severity of canker sores in people with poor nutrition. °· Numbing ointments can relieve pain. These are available in drug stores without a prescription. °· Anti-inflammatory steroid mouth rinses or gels may be prescribed by your caregiver for severe sores. °· Oral steroids may be prescribed if you have severe, recurrent canker sores. These strong medicines can cause many side effects and should be used only under the close direction of a dentist or physician. °· Mouth rinses containing the antibiotic medicine may be prescribed. They may lessen symptoms and speed healing. °Healing usually happens in about 1 or 2 weeks with or without treatment. Certain antibiotic mouth rinses given to pregnant women and young children can permanently stain teeth.  Talk to your caregiver about your treatment. °HOME CARE INSTRUCTIONS  °· Avoid foods that cause canker sores for you. °· Avoid citrus juices, spicy or salty foods, and coffee until the sores are healed. °· Use a soft-bristled toothbrush. °· Chew your food carefully to avoid biting your cheek. °· Apply topical numbing medicine to the sore to help relieve pain. °· Apply a thin paste of baking soda and water to the sore to help heal the sore. °· Only use mouth rinses or medicines for pain or discomfort as directed by your caregiver. °SEEK MEDICAL CARE IF:  °· Your symptoms are not better in 1 week. °· Your sores are still present after 2 weeks. °· Your sores are very painful. °· You have trouble breathing or swallowing. °· Your sores come back frequently. °Document Released: 06/30/2010 Document Revised: 06/30/2012 Document Reviewed: 06/30/2010 °ExitCare® Patient Information ©2015 [employer]. This information is not intended to replace advice given to you by your health care provider. Make sure you discuss any questions you have with your health care provider. ° °

## 2014-06-08 NOTE — Telephone Encounter (Signed)
Noted. Pt will be seen by Dr Ronnald Ramp today.

## 2014-06-08 NOTE — Progress Notes (Signed)
Subjective:    Patient ID: [name], female    DOB: 02-05-1941, 74 y.o.   MRN: [id number]  Mouth Lesions  The current episode started 3 to 5 days ago. The onset was gradual. The problem occurs continuously. The problem is moderate. Nothing relieves the symptoms. The symptoms are aggravated by eating. Associated symptoms include mouth sores, sore throat and URI. Pertinent negatives include no orthopnea, no fever, no abdominal pain, no constipation, no diarrhea, no nausea, no vomiting, no congestion, no stridor, no muscle aches, no neck pain, no neck stiffness, no cough, no wheezing and no rash. She has been experiencing a mild sore throat.      Review of Systems  Constitutional: Negative.  Negative for fever, chills, diaphoresis, appetite change and fatigue.  HENT: Positive for mouth sores and sore throat. Negative for congestion, drooling, facial swelling, sinus pressure, tinnitus, trouble swallowing and voice change.   Eyes: Negative.  Negative for visual disturbance.  Respiratory: Negative.  Negative for cough, choking, chest tightness, shortness of breath, wheezing and stridor.   Cardiovascular: Negative.  Negative for chest pain, palpitations, orthopnea and leg swelling.  Gastrointestinal: Negative.  Negative for nausea, vomiting, abdominal pain, diarrhea and constipation.  Endocrine: Negative.   Genitourinary: Negative.   Musculoskeletal: Negative.  Negative for neck pain.  Skin: Negative.  Negative for rash.  Allergic/Immunologic: Negative.   Neurological: Negative.   Hematological: Negative.  Negative for adenopathy. Does not bruise/bleed easily.  Psychiatric/Behavioral: Negative.        Objective:   Physical Exam  Constitutional: She is oriented to person, place, and time. She appears well-developed and well-nourished. No distress.  HENT:  Head: Normocephalic and atraumatic.  Mouth/Throat: Uvula is midline, oropharynx is clear and moist and mucous membranes are  normal. Mucous membranes are not pale, not dry and not cyanotic. She does not have dentures. Oral lesions present. No trismus in the jaw. Normal dentition. No dental abscesses, uvula swelling, lacerations or dental caries. No oropharyngeal exudate, posterior oropharyngeal edema, posterior oropharyngeal erythema or tonsillar abscesses.    Eyes: Conjunctivae are normal. Right eye exhibits no discharge. Left eye exhibits no discharge. No scleral icterus.  Neck: Normal range of motion. Neck supple. No JVD present. No tracheal deviation present. No thyromegaly present.  Cardiovascular: Normal rate, regular rhythm, normal heart sounds and intact distal pulses.  Exam reveals no gallop and no friction rub.   No murmur heard. Pulmonary/Chest: Effort normal and breath sounds normal. No stridor. No respiratory distress. She has no wheezes. She has no rales. She exhibits no tenderness.  Abdominal: Soft. Bowel sounds are normal. She exhibits no distension and no mass. There is no tenderness. There is no rebound and no guarding.  Musculoskeletal: Normal range of motion. She exhibits no edema or tenderness.  Lymphadenopathy:    She has no cervical adenopathy.  Neurological: She is oriented to person, place, and time.  Skin: Skin is warm and dry. No rash noted. She is not diaphoretic. No erythema. No pallor.  Psychiatric: She has a normal mood and affect. Her behavior is normal. Judgment and thought content normal.  Vitals reviewed.   Lab Results  Component Value Date   WBC 7.8 09/17/2013   HGB 11.6* 09/17/2013   HCT 34.9* 09/17/2013   PLT 330.0 09/17/2013   GLUCOSE 115* 09/17/2013   CHOL 213* 06/25/2013   TRIG 112.0 06/25/2013   HDL 56.00 06/25/2013   LDLDIRECT 120.6 11/24/2012   LDLCALC 135* 06/25/2013   ALT 32 11/24/2012  ALT 32 11/24/2012   AST 27 11/24/2012   AST 27 11/24/2012   NA 138 09/17/2013   K 3.6 09/17/2013   CL 102 09/17/2013   CREATININE 1.0 09/17/2013   BUN 25* 09/17/2013    CO2 31 09/17/2013   TSH 2.26 09/17/2013   INR 1.0 10/01/2011   HGBA1C 6.8* 09/17/2013        Assessment & Plan:

## 2014-06-08 NOTE — Telephone Encounter (Signed)
LMOVM for pt to return call 

## 2014-06-09 ENCOUNTER — Ambulatory Visit: Payer: Medicare Other | Admitting: Internal Medicine

## 2014-06-23 ENCOUNTER — Ambulatory Visit (INDEPENDENT_AMBULATORY_CARE_PROVIDER_SITE_OTHER): Payer: Medicare Other | Admitting: Internal Medicine

## 2014-06-23 ENCOUNTER — Other Ambulatory Visit (INDEPENDENT_AMBULATORY_CARE_PROVIDER_SITE_OTHER): Payer: Medicare Other

## 2014-06-23 ENCOUNTER — Encounter: Payer: Self-pay | Admitting: Internal Medicine

## 2014-06-23 VITALS — BP 120/80 | HR 75 | Temp 97.0°F | Resp 16 | Ht <= 58 in | Wt 179.0 lb

## 2014-06-23 DIAGNOSIS — K2100 Gastro-esophageal reflux disease with esophagitis, without bleeding: Secondary | ICD-10-CM

## 2014-06-23 DIAGNOSIS — E038 Other specified hypothyroidism: Secondary | ICD-10-CM | POA: Diagnosis not present

## 2014-06-23 DIAGNOSIS — J309 Allergic rhinitis, unspecified: Secondary | ICD-10-CM

## 2014-06-23 DIAGNOSIS — I10 Essential (primary) hypertension: Secondary | ICD-10-CM

## 2014-06-23 DIAGNOSIS — E039 Hypothyroidism, unspecified: Secondary | ICD-10-CM

## 2014-06-23 DIAGNOSIS — E118 Type 2 diabetes mellitus with unspecified complications: Secondary | ICD-10-CM

## 2014-06-23 LAB — URINALYSIS, ROUTINE W REFLEX MICROSCOPIC
BILIRUBIN URINE: NEGATIVE
HGB URINE DIPSTICK: NEGATIVE
Ketones, ur: NEGATIVE
Leukocytes, UA: NEGATIVE
NITRITE: NEGATIVE
PH: 6 (ref 5.0–8.0)
Specific Gravity, Urine: 1.025 (ref 1.000–1.030)
Total Protein, Urine: NEGATIVE
Urine Glucose: NEGATIVE
Urobilinogen, UA: 0.2 (ref 0.0–1.0)

## 2014-06-23 LAB — MICROALBUMIN / CREATININE URINE RATIO
Creatinine,U: 167.6 mg/dL
Microalb Creat Ratio: 0.5 mg/g (ref 0.0–30.0)
Microalb, Ur: 0.8 mg/dL (ref 0.0–1.9)

## 2014-06-23 LAB — BASIC METABOLIC PANEL
BUN: 20 mg/dL (ref 6–23)
CALCIUM: 10.1 mg/dL (ref 8.4–10.5)
CO2: 26 meq/L (ref 19–32)
Chloride: 104 mEq/L (ref 96–112)
Creatinine, Ser: 0.85 mg/dL (ref 0.40–1.20)
GFR: 83.84 mL/min (ref >60.00)
GLUCOSE: 116 mg/dL — AB (ref 70–99)
Potassium: 4.3 mEq/L (ref 3.5–5.1)
SODIUM: 137 meq/L (ref 135–145)

## 2014-06-23 LAB — TSH: TSH: 3.18 u[IU]/mL (ref 0.35–4.50)

## 2014-06-23 LAB — HEMOGLOBIN A1C: Hgb A1c MFr Bld: 6.9 % — ABNORMAL HIGH (ref 4.6–6.5)

## 2014-06-23 MED ORDER — CARVEDILOL 3.125 MG PO TABS: 3.1250 mg | ORAL_TABLET | Freq: Two times a day (BID) | ORAL | Status: DC

## 2014-06-23 MED ORDER — AZELASTINE-FLUTICASONE 137-50 MCG/ACT NA SUSP: 1.0000 | Freq: Two times a day (BID) | NASAL | Status: DC

## 2014-06-23 MED ORDER — DILTIAZEM HCL ER COATED BEADS 180 MG PO CP24: 180.0000 mg | ORAL_CAPSULE | Freq: Every evening | ORAL | Status: DC

## 2014-06-23 MED ORDER — LOSARTAN POTASSIUM 100 MG PO TABS: 100.0000 mg | ORAL_TABLET | Freq: Every day | ORAL | Status: DC

## 2014-06-23 NOTE — Patient Instructions (Signed)
Hypothyroidism The thyroid is a large gland located in the lower front of your neck. The thyroid gland helps control metabolism. Metabolism is how your body handles food. It controls metabolism with the hormone thyroxine. When this gland is underactive (hypothyroid), it produces too little hormone.  CAUSES These include:   Absence or destruction of thyroid tissue.  Goiter due to iodine deficiency.  Goiter due to medications.  Congenital defects (since birth).  Problems with the pituitary. This causes a lack of TSH (thyroid stimulating hormone). This hormone tells the thyroid to turn out more hormone. SYMPTOMS  Lethargy (feeling as though you have no energy)  Cold intolerance  Weight gain (in spite of normal food intake)  Dry skin  Coarse hair  Menstrual irregularity (if severe, may lead to infertility)  Slowing of thought processes Cardiac problems are also caused by insufficient amounts of thyroid hormone. Hypothyroidism in the newborn is cretinism, and is an extreme form. It is important that this form be treated adequately and immediately or it will lead rapidly to retarded physical and mental development. DIAGNOSIS  To prove hypothyroidism, your caregiver may do blood tests and ultrasound tests. Sometimes the signs are hidden. It may be necessary for your caregiver to watch this illness with blood tests either before or after diagnosis and treatment. TREATMENT  Low levels of thyroid hormone are increased by using synthetic thyroid hormone. This is a safe, effective treatment. It usually takes about four weeks to gain the full effects of the medication. After you have the full effect of the medication, it will generally take another four weeks for problems to leave. Your caregiver may start you on low doses. If you have had heart problems the dose may be gradually increased. It is generally not an emergency to get rapidly to normal. HOME CARE INSTRUCTIONS   Take your  medications as your caregiver suggests. Let your caregiver know of any medications you are taking or start taking. Your caregiver will help you with dosage schedules.  As your condition improves, your dosage needs may increase. It will be necessary to have continuing blood tests as suggested by your caregiver.  Report all suspected medication side effects to your caregiver. SEEK MEDICAL CARE IF: Seek medical care if you develop:  Sweating.  Tremulousness (tremors).  Anxiety.  Rapid weight loss.  Heat intolerance.  Emotional swings.  Diarrhea.  Weakness. SEEK IMMEDIATE MEDICAL CARE IF:  You develop chest pain, an irregular heart beat (palpitations), or a rapid heart beat. MAKE SURE YOU:   Understand these instructions.  Will watch your condition.  Will get help right away if you are not doing well or get worse. Document Released: 03/05/2005 Document Revised: 05/28/2011 Document Reviewed: 10/24/2007 Exit[employer] Patient Information 2015 [employer]. This information is not intended to replace advice given to you by your health care provider. Make sure you discuss any questions you have with your health care provider.  

## 2014-06-23 NOTE — Progress Notes (Signed)
Pre visit review using our clinic review tool, if applicable. No additional management support is needed unless otherwise documented below in the visit note. 

## 2014-06-23 NOTE — Progress Notes (Signed)
Subjective:    Patient ID: [name], female    DOB: 02/21/1941, 74 y.o.   MRN: [id number]  Thyroid Problem Presents for follow-up visit. Symptoms include anxiety. Patient reports no cold intolerance, constipation, depressed mood, diaphoresis, diarrhea, dry skin, fatigue, hair loss, heat intolerance, hoarse voice, leg swelling, nail problem, palpitations, tremors, visual change, weight gain or weight loss. The symptoms have been stable. Past treatments include levothyroxine.      Review of Systems  Constitutional: Negative.  Negative for fever, chills, weight loss, weight gain, diaphoresis, appetite change and fatigue.  HENT: Positive for postnasal drip and rhinorrhea. Negative for congestion, facial swelling, hoarse voice, nosebleeds, sinus pressure, sneezing, sore throat and trouble swallowing.   Eyes: Negative.   Respiratory: Negative.  Negative for cough, choking, chest tightness, shortness of breath and stridor.   Cardiovascular: Negative.  Negative for chest pain, palpitations and leg swelling.  Gastrointestinal: Negative.  Negative for abdominal pain, diarrhea and constipation.  Endocrine: Negative.  Negative for cold intolerance and heat intolerance.  Genitourinary: Negative.   Musculoskeletal: Negative.   Neurological: Negative.  Negative for tremors.  Hematological: Negative.  Negative for adenopathy. Does not bruise/bleed easily.  Psychiatric/Behavioral: The patient is nervous/anxious.        Objective:   Physical Exam  Constitutional: She is oriented to person, place, and time. She appears well-developed and well-nourished. No distress.  HENT:  Right Ear: Hearing, tympanic membrane, external ear and ear canal normal.  Left Ear: Hearing, tympanic membrane, external ear and ear canal normal.  Nose: Mucosal edema and rhinorrhea present. No nose lacerations, sinus tenderness, nasal deformity, septal deviation or nasal septal hematoma. No epistaxis.  No foreign bodies.  Right sinus exhibits no maxillary sinus tenderness and no frontal sinus tenderness. Left sinus exhibits no maxillary sinus tenderness and no frontal sinus tenderness.  Mouth/Throat: Oropharynx is clear and moist and mucous membranes are normal. Mucous membranes are not pale, not dry and not cyanotic. No oral lesions. No trismus in the jaw. No uvula swelling. No oropharyngeal exudate, posterior oropharyngeal edema, posterior oropharyngeal erythema or tonsillar abscesses.  Eyes: Conjunctivae are normal. Right eye exhibits no discharge. Left eye exhibits no discharge. No scleral icterus.  Neck: Normal range of motion. Neck supple. No JVD present. No tracheal deviation present. No thyromegaly present.  Cardiovascular: Normal rate, regular rhythm, normal heart sounds and intact distal pulses.  Exam reveals no gallop and no friction rub.   No murmur heard. Pulmonary/Chest: Effort normal and breath sounds normal. No stridor. No respiratory distress. She has no wheezes. She has no rales. She exhibits no tenderness.  Abdominal: Soft. Bowel sounds are normal. She exhibits no distension and no mass. There is no tenderness. There is no rebound and no guarding.  Musculoskeletal: Normal range of motion. She exhibits no edema or tenderness.  Lymphadenopathy:    She has no cervical adenopathy.  Neurological: She is oriented to person, place, and time.  Skin: Skin is warm and dry. No rash noted. She is not diaphoretic. No erythema. No pallor.  Vitals reviewed.   Lab Results  Component Value Date   WBC 7.8 09/17/2013   HGB 11.6* 09/17/2013   HCT 34.9* 09/17/2013   PLT 330.0 09/17/2013   GLUCOSE 116* 06/23/2014   CHOL 213* 06/25/2013   TRIG 112.0 06/25/2013   HDL 56.00 06/25/2013   LDLDIRECT 120.6 11/24/2012   LDLCALC 135* 06/25/2013   ALT 32 11/24/2012   ALT 32 11/24/2012   AST 27 11/24/2012   AST  27 11/24/2012   NA 137 06/23/2014   K 4.3 06/23/2014   CL 104 06/23/2014   CREATININE 0.85 06/23/2014     BUN 20 06/23/2014   CO2 26 06/23/2014   TSH 3.18 06/23/2014   INR 1.0 10/01/2011   HGBA1C 6.9* 06/23/2014   MICROALBUR 0.8 06/23/2014        Assessment & Plan:

## 2014-06-24 ENCOUNTER — Encounter: Payer: Self-pay | Admitting: Internal Medicine

## 2014-06-24 ENCOUNTER — Telehealth: Payer: Self-pay | Admitting: Internal Medicine

## 2014-06-24 DIAGNOSIS — J3089 Other allergic rhinitis: Secondary | ICD-10-CM

## 2014-06-24 MED ORDER — FLUTICASONE PROPIONATE 50 MCG/ACT NA SUSP: 2.0000 | Freq: Every day | NASAL | Status: DC

## 2014-06-24 MED ORDER — AZELASTINE HCL 0.1 % NA SOLN: 1.0000 | Freq: Two times a day (BID) | NASAL | Status: DC

## 2014-06-24 MED ORDER — LEVOTHYROXINE SODIUM 75 MCG PO TABS: 75.0000 ug | ORAL_TABLET | Freq: Every day | ORAL | Status: DC

## 2014-06-24 NOTE — Telephone Encounter (Signed)
changed

## 2014-06-24 NOTE — Assessment & Plan Note (Signed)
Will start dymista for this

## 2014-06-24 NOTE — Assessment & Plan Note (Signed)
Her BP is well controlled Lytes and renal function are stable 

## 2014-06-24 NOTE — Assessment & Plan Note (Signed)
Her blood sugars are adequately well controlled 

## 2014-06-24 NOTE — Telephone Encounter (Signed)
The prescription Azelastine-Fluticasone (DYMISTA) 137-50 MCG/ACT SUSP [[id number]] . It is tier 4 and it is too expensive for her to take. She was wondering if there is anything else or if you have some samples for her.

## 2014-06-24 NOTE — Assessment & Plan Note (Signed)
Her TSh is in the normal range Will stay on the current dose 

## 2014-08-18 ENCOUNTER — Other Ambulatory Visit: Payer: Self-pay

## 2014-08-18 MED ORDER — GLUCOSE BLOOD VI STRP: ORAL_STRIP | Status: DC

## 2014-08-19 ENCOUNTER — Ambulatory Visit (INDEPENDENT_AMBULATORY_CARE_PROVIDER_SITE_OTHER): Payer: Medicare Other | Admitting: Internal Medicine

## 2014-08-19 ENCOUNTER — Encounter: Payer: Self-pay | Admitting: Internal Medicine

## 2014-08-19 VITALS — BP 156/94 | HR 64 | Temp 98.5°F | Resp 16 | Ht <= 58 in

## 2014-08-19 DIAGNOSIS — F411 Generalized anxiety disorder: Secondary | ICD-10-CM

## 2014-08-19 DIAGNOSIS — I10 Essential (primary) hypertension: Secondary | ICD-10-CM | POA: Diagnosis not present

## 2014-08-19 MED ORDER — ALPRAZOLAM 0.25 MG PO TABS: 0.2500 mg | ORAL_TABLET | Freq: Two times a day (BID) | ORAL | Status: DC | PRN

## 2014-08-19 MED ORDER — INDAPAMIDE 2.5 MG PO TABS: 2.5000 mg | ORAL_TABLET | Freq: Every day | ORAL | Status: DC

## 2014-08-19 NOTE — Progress Notes (Signed)
Subjective:  Patient ID: [name], female    DOB: Dec 27, 1940  Age: 74 y.o. MRN: [id number]  CC: Hypertension   HPI [name] presents for a BP check, her BP has been high at home, as high as 151/102 and 158/96, and she occasionally experiences headaches.  Outpatient Prescriptions Prior to Visit  Medication Sig Dispense Refill  . aspirin 81 MG tablet Take 81 mg by mouth daily.    Marland Kitchen azelastine (ASTELIN) 0.1 % nasal spray Place 1 spray into both nostrils 2 (two) times daily. Use in each nostril as directed 30 mL 11  . Calcium Carbonate-Vitamin D (CALCIUM 600+D) 600-200 MG-UNIT TABS Take by mouth daily.      . carvedilol (COREG) 3.125 MG tablet Take 1 tablet (3.125 mg total) by mouth 2 (two) times daily with a meal. 180 tablet 3  . Cinnamon 500 MG TABS Take by mouth daily.      Marland Kitchen diltiazem (DILT-CD) 180 MG 24 hr capsule Take 1 capsule (180 mg total) by mouth every evening. 90 capsule 3  . FLUoxetine (PROZAC) 20 MG capsule TAKE 1 CAPSULE AT BEDTIME 90 capsule 3  . fluticasone (FLONASE) 50 MCG/ACT nasal spray Place 2 sprays into both nostrils daily. 16 g 11  . Ginkgo Biloba 40 MG TABS Take by mouth daily.      Marland Kitchen glucose blood (ONETOUCH VERIO) test strip Test 3 times daily 100 each 12  . levothyroxine (SYNTHROID) 75 MCG tablet Take 1 tablet (75 mcg total) by mouth daily. 90 tablet 2  . lidocaine (XYLOCAINE) 2 % solution Use as directed 10 mLs in the mouth or throat as needed for mouth pain. Gargle & spit as needed for comfort. 100 mL 0  . loratadine (CLARITIN) 10 MG tablet Take 1 tablet (10 mg total) by mouth daily.    Marland Kitchen losartan (COZAAR) 100 MG tablet Take 1 tablet (100 mg total) by mouth daily. 90 tablet 3  . Multiple Vitamins-Minerals (CENTRUM SILVER PO) Take by mouth daily.      . ONE TOUCH LANCETS MISC Use with One Touch Verio monitor as directed Twice Daily to test blood glucose levels. DX: 250.60 200 each 5  . Probiotic Product (PROBIOTIC & ACIDOPHILUS EX ST PO) Take 1 tablet  by mouth daily.    . Red Yeast Rice 600 MG CAPS Take 1 capsule (600 mg total) by mouth daily.    Marland Kitchen triamcinolone cream (KENALOG) 0.5 % Apply 1 application topically 3 (three) times daily. 30 g 1  . UNABLE TO FIND ACCULAR 0.5%--RIGHT EYE    . ALPRAZolam (XANAX) 0.25 MG tablet Take 0.25 mg by mouth at bedtime as needed for sleep.     No facility-administered medications prior to visit.    ROS Review of Systems  Constitutional: Negative.  Negative for fever, chills, diaphoresis, appetite change and fatigue.  HENT: Negative.   Eyes: Negative.   Respiratory: Negative.  Negative for cough, choking, chest tightness, shortness of breath and stridor.   Cardiovascular: Negative.  Negative for chest pain, palpitations and leg swelling.  Gastrointestinal: Negative.  Negative for abdominal pain and constipation.  Endocrine: Negative.   Genitourinary: Negative.  Negative for difficulty urinating.  Musculoskeletal: Negative.  Negative for myalgias, back pain, joint swelling and arthralgias.  Skin: Negative.  Negative for rash.  Allergic/Immunologic: Negative.   Neurological: Negative.  Negative for dizziness, syncope, facial asymmetry, speech difficulty, weakness, light-headedness and numbness.  Hematological: Negative.  Negative for adenopathy. Does not bruise/bleed easily.  Psychiatric/Behavioral: Positive for  sleep disturbance. Negative for suicidal ideas, hallucinations, behavioral problems, confusion, self-injury, dysphoric mood and decreased concentration. The patient is nervous/anxious. The patient is not hyperactive.     Objective:  BP 156/94 mmHg  Pulse 64  Temp(Src) 98.5 F (36.9 C) (Oral)  Resp 16  Ht 4\' 10"  (1.473 m)  Wt   SpO2 98%  BP Readings from Last 3 Encounters:  08/19/14 156/94  06/23/14 120/80  06/08/14 118/74    Wt Readings from Last 3 Encounters:  06/23/14 179 lb (81.194 kg)  09/17/13 186 lb (84.369 kg)  06/25/13 183 lb 12 oz (83.348 kg)    Physical Exam    Constitutional: She is oriented to person, place, and time. She appears well-developed and well-nourished. No distress.  HENT:  Head: Normocephalic and atraumatic.  Mouth/Throat: Oropharynx is clear and moist. No oropharyngeal exudate.  Eyes: Conjunctivae are normal. Right eye exhibits no discharge. Left eye exhibits no discharge. No scleral icterus.  Neck: Normal range of motion. Neck supple. No JVD present. No tracheal deviation present. No thyromegaly present.  Cardiovascular: Normal rate, regular rhythm, normal heart sounds and intact distal pulses.  Exam reveals no gallop and no friction rub.   No murmur heard. Pulmonary/Chest: Effort normal and breath sounds normal. No stridor. No respiratory distress. She has no wheezes. She has no rales. She exhibits no tenderness.  Abdominal: Soft. Bowel sounds are normal. She exhibits no distension and no mass. There is no tenderness. There is no rebound and no guarding.  Musculoskeletal: Normal range of motion. She exhibits no edema or tenderness.  Lymphadenopathy:    She has no cervical adenopathy.  Neurological: She is alert and oriented to person, place, and time. She has normal reflexes. She displays normal reflexes. No cranial nerve deficit. She exhibits normal muscle tone. Coordination normal.  Skin: Skin is warm and dry. No rash noted. She is not diaphoretic. No erythema. No pallor.  Psychiatric: She has a normal mood and affect. Her behavior is normal. Judgment and thought content normal.  Vitals reviewed.   Lab Results  Component Value Date   WBC 7.8 09/17/2013   HGB 11.6* 09/17/2013   HCT 34.9* 09/17/2013   PLT 330.0 09/17/2013   GLUCOSE 116* 06/23/2014   CHOL 213* 06/25/2013   TRIG 112.0 06/25/2013   HDL 56.00 06/25/2013   LDLDIRECT 120.6 11/24/2012   LDLCALC 135* 06/25/2013   ALT 32 11/24/2012   ALT 32 11/24/2012   AST 27 11/24/2012   AST 27 11/24/2012   NA 137 06/23/2014   K 4.3 06/23/2014   CL 104 06/23/2014    CREATININE 0.85 06/23/2014   BUN 20 06/23/2014   CO2 26 06/23/2014   TSH 3.18 06/23/2014   INR 1.0 10/01/2011   HGBA1C 6.9* 06/23/2014   MICROALBUR 0.8 06/23/2014    Mm Screening Breast Tomo Bilateral  03/08/2014   CLINICAL DATA:  Screening.  EXAM: DIGITAL SCREENING BILATERAL MAMMOGRAM WITH 3D TOMO WITH CAD  COMPARISON:  Previous exam(s).  ACR Breast Density Category c: The breast tissue is heterogeneously dense, which may obscure small masses.  FINDINGS: There are no findings suspicious for malignancy. Images were processed with CAD.  IMPRESSION: No mammographic evidence of malignancy. A result letter of this screening mammogram will be mailed directly to the patient.  RECOMMENDATION: Screening mammogram in one year. (Code:SM-B-01Y)  BI-RADS CATEGORY  1: Negative.   Electronically Signed   By: Lajean Manes M.D.   On: 03/08/2014 10:07    Assessment & Plan:  Decklyn was seen today for hypertension.  Diagnoses and all orders for this visit:  Essential hypertension - her BP is not well controlled, will restart the diuretic, she will cont all the other BP agents Orders: -     indapamide (LOZOL) 2.5 MG tablet; Take 1 tablet (2.5 mg total) by mouth daily.  GAD (generalized anxiety disorder) Orders: -     ALPRAZolam (XANAX) 0.25 MG tablet; Take 1 tablet (0.25 mg total) by mouth 2 (two) times daily as needed.  I have changed Ms. Swigart's ALPRAZolam. I am also having her start on indapamide. Additionally, I am having her maintain her Calcium Carbonate-Vitamin D, Multiple Vitamins-Minerals (CENTRUM SILVER PO), Cinnamon, Ginkgo Biloba, ONE TOUCH LANCETS, aspirin, Probiotic Product (PROBIOTIC & ACIDOPHILUS EX ST PO), Red Yeast Rice, UNABLE TO FIND, FLUoxetine, triamcinolone cream, loratadine, lidocaine, losartan, diltiazem, carvedilol, levothyroxine, fluticasone, azelastine, and glucose blood.  Meds ordered this encounter  Medications  . indapamide (LOZOL) 2.5 MG tablet    Sig: Take 1 tablet  (2.5 mg total) by mouth daily.    Dispense:  90 tablet    Refill:  1  . ALPRAZolam (XANAX) 0.25 MG tablet    Sig: Take 1 tablet (0.25 mg total) by mouth 2 (two) times daily as needed.    Dispense:  30 tablet    Refill:  2     Follow-up: Return in about 2 months (around 10/19/2014).  Scarlette Calico, MD

## 2014-08-19 NOTE — Progress Notes (Signed)
Pre visit review using our clinic review tool, if applicable. No additional management support is needed unless otherwise documented below in the visit note. Pt declines weight

## 2014-08-19 NOTE — Patient Instructions (Signed)

## 2014-08-24 DIAGNOSIS — H04123 Dry eye syndrome of bilateral lacrimal glands: Secondary | ICD-10-CM | POA: Diagnosis not present

## 2014-08-24 DIAGNOSIS — H1013 Acute atopic conjunctivitis, bilateral: Secondary | ICD-10-CM | POA: Diagnosis not present

## 2014-09-01 DIAGNOSIS — H10013 Acute follicular conjunctivitis, bilateral: Secondary | ICD-10-CM | POA: Diagnosis not present

## 2014-09-01 DIAGNOSIS — B309 Viral conjunctivitis, unspecified: Secondary | ICD-10-CM | POA: Diagnosis not present

## 2014-09-08 DIAGNOSIS — B309 Viral conjunctivitis, unspecified: Secondary | ICD-10-CM | POA: Diagnosis not present

## 2014-09-08 DIAGNOSIS — H10013 Acute follicular conjunctivitis, bilateral: Secondary | ICD-10-CM | POA: Diagnosis not present

## 2014-09-10 ENCOUNTER — Other Ambulatory Visit: Payer: Self-pay

## 2014-09-10 DIAGNOSIS — I10 Essential (primary) hypertension: Secondary | ICD-10-CM

## 2014-09-10 DIAGNOSIS — E039 Hypothyroidism, unspecified: Secondary | ICD-10-CM

## 2014-09-10 MED ORDER — LEVOTHYROXINE SODIUM 75 MCG PO TABS: 75.0000 ug | ORAL_TABLET | Freq: Every day | ORAL | Status: DC

## 2014-09-10 MED ORDER — CARVEDILOL 3.125 MG PO TABS: 3.1250 mg | ORAL_TABLET | Freq: Two times a day (BID) | ORAL | Status: DC

## 2014-09-10 MED ORDER — FLUOXETINE HCL 20 MG PO CAPS: ORAL_CAPSULE | ORAL | Status: DC

## 2014-09-13 ENCOUNTER — Telehealth: Payer: Self-pay | Admitting: Internal Medicine

## 2014-09-13 ENCOUNTER — Other Ambulatory Visit: Payer: Self-pay

## 2014-09-13 ENCOUNTER — Telehealth: Payer: Self-pay | Admitting: *Deleted

## 2014-09-13 ENCOUNTER — Other Ambulatory Visit: Payer: Self-pay | Admitting: Internal Medicine

## 2014-09-13 DIAGNOSIS — I10 Essential (primary) hypertension: Secondary | ICD-10-CM

## 2014-09-13 DIAGNOSIS — E039 Hypothyroidism, unspecified: Secondary | ICD-10-CM

## 2014-09-13 MED ORDER — POTASSIUM CHLORIDE CRYS ER 20 MEQ PO TBCR
20.0000 meq | EXTENDED_RELEASE_TABLET | Freq: Every day | ORAL | Status: DC
Start: 2014-09-13 — End: 2014-09-16

## 2014-09-13 MED ORDER — INDAPAMIDE 2.5 MG PO TABS: 2.5000 mg | ORAL_TABLET | Freq: Every day | ORAL | Status: DC

## 2014-09-13 NOTE — Telephone Encounter (Signed)
Patients refills for potassium chloride SA (K-DUR,KLOR-CON) 20 MEQ tablet [[id number]], indapamide (LOZOL) 2.5 MG tablet [[id number], carvedilol (COREG) 3.125 MG tablet [[id number], FLUoxetine (PROZAC) 20 MG capsule [[id number], and levothyroxine (SYNTHROID) 75 MCG tablet [[id number]] all 90 day should be sent to [facility]

## 2014-09-13 NOTE — Telephone Encounter (Signed)
done

## 2014-09-13 NOTE — Telephone Encounter (Signed)
Refill request faxed in for Potassium CL tablet, did not see on pt's med list. Please advise.

## 2014-09-16 MED ORDER — FLUOXETINE HCL 20 MG PO CAPS: ORAL_CAPSULE | ORAL | Status: DC

## 2014-09-16 MED ORDER — CARVEDILOL 3.125 MG PO TABS: 3.1250 mg | ORAL_TABLET | Freq: Two times a day (BID) | ORAL | Status: DC

## 2014-09-16 MED ORDER — POTASSIUM CHLORIDE CRYS ER 20 MEQ PO TBCR: 20.0000 meq | EXTENDED_RELEASE_TABLET | Freq: Every day | ORAL | Status: DC

## 2014-09-16 MED ORDER — LEVOTHYROXINE SODIUM 75 MCG PO TABS: 75.0000 ug | ORAL_TABLET | Freq: Every day | ORAL | Status: DC

## 2014-09-16 MED ORDER — INDAPAMIDE 2.5 MG PO TABS: 2.5000 mg | ORAL_TABLET | Freq: Every day | ORAL | Status: DC

## 2014-09-16 NOTE — Telephone Encounter (Signed)
All medication listed below approved and sent e-script to mail order per pt request.

## 2014-09-16 NOTE — Telephone Encounter (Signed)
Patient is calling back on the encounter below. Can you please send these medications to [facility] all 90 day

## 2014-09-27 DIAGNOSIS — H1013 Acute atopic conjunctivitis, bilateral: Secondary | ICD-10-CM | POA: Diagnosis not present

## 2014-09-27 DIAGNOSIS — H10013 Acute follicular conjunctivitis, bilateral: Secondary | ICD-10-CM | POA: Diagnosis not present

## 2014-10-08 DIAGNOSIS — Z961 Presence of intraocular lens: Secondary | ICD-10-CM | POA: Diagnosis not present

## 2014-10-08 DIAGNOSIS — IMO0002 Reserved for concepts with insufficient information to code with codable children: Secondary | ICD-10-CM | POA: Insufficient documentation

## 2014-10-08 DIAGNOSIS — H10013 Acute follicular conjunctivitis, bilateral: Secondary | ICD-10-CM | POA: Diagnosis not present

## 2014-10-11 ENCOUNTER — Encounter: Payer: Self-pay | Admitting: Internal Medicine

## 2014-10-11 ENCOUNTER — Other Ambulatory Visit (INDEPENDENT_AMBULATORY_CARE_PROVIDER_SITE_OTHER): Payer: Medicare Other

## 2014-10-11 ENCOUNTER — Ambulatory Visit (INDEPENDENT_AMBULATORY_CARE_PROVIDER_SITE_OTHER): Payer: Medicare Other | Admitting: Internal Medicine

## 2014-10-11 VITALS — BP 132/84 | HR 67 | Temp 97.8°F | Resp 16 | Ht <= 58 in

## 2014-10-11 DIAGNOSIS — E038 Other specified hypothyroidism: Secondary | ICD-10-CM

## 2014-10-11 DIAGNOSIS — E785 Hyperlipidemia, unspecified: Secondary | ICD-10-CM

## 2014-10-11 DIAGNOSIS — I10 Essential (primary) hypertension: Secondary | ICD-10-CM

## 2014-10-11 DIAGNOSIS — E118 Type 2 diabetes mellitus with unspecified complications: Secondary | ICD-10-CM | POA: Diagnosis not present

## 2014-10-11 DIAGNOSIS — R6889 Other general symptoms and signs: Secondary | ICD-10-CM | POA: Insufficient documentation

## 2014-10-11 LAB — BASIC METABOLIC PANEL
BUN: 20 mg/dL (ref 6–23)
CO2: 29 mEq/L (ref 19–32)
CREATININE: 1 mg/dL (ref 0.40–1.20)
Calcium: 9.3 mg/dL (ref 8.4–10.5)
Chloride: 103 mEq/L (ref 96–112)
GFR: 69.45 mL/min (ref >60.00)
Glucose, Bld: 85 mg/dL (ref 70–99)
Potassium: 3.4 mEq/L — ABNORMAL LOW (ref 3.5–5.1)
SODIUM: 139 meq/L (ref 135–145)

## 2014-10-11 LAB — HEMOGLOBIN A1C: HEMOGLOBIN A1C: 6.5 % (ref 4.6–6.5)

## 2014-10-11 LAB — LIPID PANEL
CHOLESTEROL: 198 mg/dL (ref 0–200)
HDL: 61.7 mg/dL (ref >39.00)
LDL CALC: 117 mg/dL — AB (ref 0–99)
NonHDL: 136.3
Total CHOL/HDL Ratio: 3
Triglycerides: 99 mg/dL (ref 0.0–149.0)
VLDL: 19.8 mg/dL (ref 0.0–40.0)

## 2014-10-11 LAB — TSH: TSH: 1.48 u[IU]/mL (ref 0.35–4.50)

## 2014-10-11 NOTE — Progress Notes (Signed)
Subjective:  Patient ID: [name], female    DOB: 1940/12/03  Age: 74 y.o. MRN: [id number]  CC: Hypertension; Hypothyroidism; and Hyperlipidemia   HPI [name] presents for follow-up. Her main complaint today is that over the last few months she has noted memory loss and forgetfulness. She offers no other complaints today.  Outpatient Prescriptions Prior to Visit  Medication Sig Dispense Refill  . ALPRAZolam (XANAX) 0.25 MG tablet Take 1 tablet (0.25 mg total) by mouth 2 (two) times daily as needed. 30 tablet 2  . aspirin 81 MG tablet Take 81 mg by mouth daily.    Marland Kitchen azelastine (ASTELIN) 0.1 % nasal spray Place 1 spray into both nostrils 2 (two) times daily. Use in each nostril as directed 30 mL 11  . Calcium Carbonate-Vitamin D (CALCIUM 600+D) 600-200 MG-UNIT TABS Take by mouth daily.      . carvedilol (COREG) 3.125 MG tablet Take 1 tablet (3.125 mg total) by mouth 2 (two) times daily with a meal. 180 tablet 3  . Cinnamon 500 MG TABS Take by mouth daily.      Marland Kitchen diltiazem (DILT-CD) 180 MG 24 hr capsule Take 1 capsule (180 mg total) by mouth every evening. 90 capsule 3  . FLUoxetine (PROZAC) 20 MG capsule TAKE 1 CAPSULE AT BEDTIME 90 capsule 3  . fluticasone (FLONASE) 50 MCG/ACT nasal spray Place 2 sprays into both nostrils daily. 16 g 11  . Ginkgo Biloba 40 MG TABS Take by mouth daily.      Marland Kitchen glucose blood (ONETOUCH VERIO) test strip Test 3 times daily 100 each 12  . indapamide (LOZOL) 2.5 MG tablet Take 1 tablet (2.5 mg total) by mouth daily. 90 tablet 1  . levothyroxine (SYNTHROID) 75 MCG tablet Take 1 tablet (75 mcg total) by mouth daily. 90 tablet 2  . lidocaine (XYLOCAINE) 2 % solution Use as directed 10 mLs in the mouth or throat as needed for mouth pain. Gargle & spit as needed for comfort. 100 mL 0  . loratadine (CLARITIN) 10 MG tablet Take 1 tablet (10 mg total) by mouth daily.    Marland Kitchen losartan (COZAAR) 100 MG tablet Take 1 tablet (100 mg total) by mouth daily. 90 tablet  3  . Multiple Vitamins-Minerals (CENTRUM SILVER PO) Take by mouth daily.      . ONE TOUCH LANCETS MISC Use with One Touch Verio monitor as directed Twice Daily to test blood glucose levels. DX: 250.60 200 each 5  . potassium chloride SA (K-DUR,KLOR-CON) 20 MEQ tablet Take 1 tablet (20 mEq total) by mouth daily. 90 tablet 3  . Probiotic Product (PROBIOTIC & ACIDOPHILUS EX ST PO) Take 1 tablet by mouth daily.    . Red Yeast Rice 600 MG CAPS Take 1 capsule (600 mg total) by mouth daily.    Marland Kitchen triamcinolone cream (KENALOG) 0.5 % Apply 1 application topically 3 (three) times daily. 30 g 1  . UNABLE TO FIND ACCULAR 0.5%--RIGHT EYE     No facility-administered medications prior to visit.    ROS Review of Systems  Constitutional: Negative.  Negative for fever, chills, diaphoresis, appetite change and fatigue.  HENT: Negative.  Negative for congestion, trouble swallowing and voice change.   Eyes: Negative.   Respiratory: Negative.  Negative for cough, choking, chest tightness, shortness of breath and stridor.   Cardiovascular: Negative.  Negative for chest pain, palpitations and leg swelling.  Gastrointestinal: Negative.  Negative for nausea, vomiting, abdominal pain, diarrhea, constipation and blood in stool.  Endocrine: Negative.  Negative for polydipsia, polyphagia and polyuria.  Genitourinary: Negative.   Musculoskeletal: Negative.  Negative for myalgias and arthralgias.  Skin: Negative.   Allergic/Immunologic: Negative.   Neurological: Negative.  Negative for dizziness, tremors, syncope, light-headedness, numbness and headaches.  Hematological: Negative for adenopathy. Does not bruise/bleed easily.  Psychiatric/Behavioral: Negative.     Objective:  BP 132/84 mmHg  Pulse 67  Temp(Src) 97.8 F (36.6 C) (Oral)  Resp 16  Ht 4\' 10"  (1.473 m)  Wt   SpO2 94%  BP Readings from Last 3 Encounters:  10/11/14 132/84  08/19/14 156/94  06/23/14 120/80    Wt Readings from Last 3 Encounters:   06/23/14 179 lb (81.194 kg)  09/17/13 186 lb (84.369 kg)  06/25/13 183 lb 12 oz (83.348 kg)    Physical Exam  Constitutional: She is oriented to person, place, and time. No distress.  HENT:  Mouth/Throat: Oropharynx is clear and moist. No oropharyngeal exudate.  Eyes: Conjunctivae are normal. Right eye exhibits no discharge. Left eye exhibits no discharge. No scleral icterus.  Neck: Normal range of motion. Neck supple. No JVD present. No tracheal deviation present. No thyromegaly present.  Cardiovascular: Normal rate, regular rhythm, normal heart sounds and intact distal pulses.  Exam reveals no gallop and no friction rub.   No murmur heard. Pulmonary/Chest: Effort normal and breath sounds normal. No stridor. No respiratory distress. She has no wheezes. She has no rales. She exhibits no tenderness.  Abdominal: Soft. Bowel sounds are normal. She exhibits no distension and no mass. There is no tenderness. There is no rebound and no guarding.  Musculoskeletal: Normal range of motion. She exhibits no edema or tenderness.  Lymphadenopathy:    She has no cervical adenopathy.  Neurological: She is oriented to person, place, and time.  Skin: Skin is warm and dry. No rash noted. She is not diaphoretic. No erythema. No pallor.  Psychiatric: Her speech is normal and behavior is normal. Judgment and thought content normal. Her mood appears anxious. Her affect is not angry, not blunt, not labile and not inappropriate. Cognition and memory are normal. She exhibits a depressed mood.  She is tearful today  Vitals reviewed.   Lab Results  Component Value Date   WBC 7.8 09/17/2013   HGB 11.6* 09/17/2013   HCT 34.9* 09/17/2013   PLT 330.0 09/17/2013   GLUCOSE 116* 06/23/2014   CHOL 213* 06/25/2013   TRIG 112.0 06/25/2013   HDL 56.00 06/25/2013   LDLDIRECT 120.6 11/24/2012   LDLCALC 135* 06/25/2013   ALT 32 11/24/2012   ALT 32 11/24/2012   AST 27 11/24/2012   AST 27 11/24/2012   NA 137  06/23/2014   K 4.3 06/23/2014   CL 104 06/23/2014   CREATININE 0.85 06/23/2014   BUN 20 06/23/2014   CO2 26 06/23/2014   TSH 3.18 06/23/2014   INR 1.0 10/01/2011   HGBA1C 6.9* 06/23/2014   MICROALBUR 0.8 06/23/2014    Mm Screening Breast Tomo Bilateral  03/08/2014   CLINICAL DATA:  Screening.  EXAM: DIGITAL SCREENING BILATERAL MAMMOGRAM WITH 3D TOMO WITH CAD  COMPARISON:  Previous exam(s).  ACR Breast Density Category c: The breast tissue is heterogeneously dense, which may obscure small masses.  FINDINGS: There are no findings suspicious for malignancy. Images were processed with CAD.  IMPRESSION: No mammographic evidence of malignancy. A result letter of this screening mammogram will be mailed directly to the patient.  RECOMMENDATION: Screening mammogram in one year. (Code:SM-B-01Y)  BI-RADS CATEGORY  1: Negative.   Electronically Signed   By: Lajean Manes M.D.   On: 03/08/2014 10:07    Assessment & Plan:   Robert was seen today for hypertension, hypothyroidism and hyperlipidemia.  Diagnoses and all orders for this visit:  Essential hypertension- her blood pressure is well controlled, will monitor her lites and renal function Orders: -     Basic metabolic panel; Future  Other specified hypothyroidism- I will recheck her TSH and adjust her dose if needed  Type II diabetes mellitus with manifestations- will recheck her A1c, if she does not have good control of her blood sugars and will make recommendations. For the time being she agrees to work on lifestyle modifications. Orders: -     Hemoglobin A1c; Future  Hyperlipidemia with target LDL less than 100- she is due for a fasting lipid panel. Orders: -     Lipid panel; Future -     TSH; Future  Forgetfulness Orders: -     Ambulatory referral to Neurology   I am having Ms. Steinmeyer maintain her Calcium Carbonate-Vitamin D, Multiple Vitamins-Minerals (CENTRUM SILVER PO), Cinnamon, Ginkgo Biloba, ONE TOUCH LANCETS, aspirin,  Probiotic Product (PROBIOTIC & ACIDOPHILUS EX ST PO), Red Yeast Rice, UNABLE TO FIND, triamcinolone cream, loratadine, lidocaine, losartan, diltiazem, fluticasone, azelastine, glucose blood, ALPRAZolam, potassium chloride SA, indapamide, carvedilol, FLUoxetine, levothyroxine, and prednisoLONE acetate.  Meds ordered this encounter  Medications  . prednisoLONE acetate (PRED FORTE) 1 % ophthalmic suspension    Sig: PLACE 1 DROP INTO BOTH EYES 4 TIMES A DAY    Refill:  0     Follow-up: Return in about 4 months (around 02/11/2015).  Scarlette Calico, MD

## 2014-10-11 NOTE — Progress Notes (Signed)
Pre visit review using our clinic review tool, if applicable. No additional management support is needed unless otherwise documented below in the visit note.   Pt declines to weigh on scale.

## 2014-10-11 NOTE — Patient Instructions (Signed)

## 2014-10-12 ENCOUNTER — Other Ambulatory Visit: Payer: Self-pay | Admitting: Internal Medicine

## 2014-10-12 ENCOUNTER — Encounter: Payer: Self-pay | Admitting: Internal Medicine

## 2014-10-12 DIAGNOSIS — E785 Hyperlipidemia, unspecified: Secondary | ICD-10-CM

## 2014-10-12 MED ORDER — ATORVASTATIN CALCIUM 20 MG PO TABS: 20.0000 mg | ORAL_TABLET | Freq: Every day | ORAL | Status: DC

## 2014-10-15 ENCOUNTER — Telehealth: Payer: Self-pay | Admitting: Internal Medicine

## 2014-10-15 NOTE — Telephone Encounter (Signed)
Please call patient asap regarding atorvastatin (LIPITOR) 20 MG tablet [[id number] that was prescribed to her the other day when she was in here. She doesn't feel she should be taking this and would like to talk with you. She is anxious to get your phone call

## 2014-10-19 ENCOUNTER — Telehealth: Payer: Self-pay

## 2014-10-19 NOTE — Telephone Encounter (Signed)
Will discontinue the lipitor if she has had trouble with statins before

## 2014-10-19 NOTE — Telephone Encounter (Signed)
Advised patient she can stop taking lipitor and work on bringing LDL down with diet and exercise--patient repeated back for understanding

## 2014-10-19 NOTE — Telephone Encounter (Signed)
Patient states we sent rx for lipitor to [facility] (mail [facility]vc)---and she feels this is in error, she states dr Ronnald Ramp would not put her on this because he always lets her try a 30 day supply first from a local pharmacy before using the mail svc for refills--she has already tried statins before and they caused muscle cramps/aches, so she really thinks this med is not for her---i did explain LDL values, but patient wants dr Ronnald Ramp to advise about this new rx that she claims was never discussed at her office visit-----dr jones, please advise, i will call her back, thanks

## 2014-10-19 NOTE — Telephone Encounter (Signed)
Left message advising patient to call back and ask for [name]

## 2014-10-29 DIAGNOSIS — H10013 Acute follicular conjunctivitis, bilateral: Secondary | ICD-10-CM | POA: Diagnosis not present

## 2014-10-29 DIAGNOSIS — Z961 Presence of intraocular lens: Secondary | ICD-10-CM | POA: Diagnosis not present

## 2014-11-29 ENCOUNTER — Encounter: Payer: Self-pay | Admitting: Neurology

## 2014-11-29 ENCOUNTER — Ambulatory Visit (INDEPENDENT_AMBULATORY_CARE_PROVIDER_SITE_OTHER): Payer: Medicare Other | Admitting: Neurology

## 2014-11-29 ENCOUNTER — Ambulatory Visit (INDEPENDENT_AMBULATORY_CARE_PROVIDER_SITE_OTHER): Payer: Commercial Managed Care - HMO | Admitting: Ophthalmology

## 2014-11-29 VITALS — BP 118/72 | HR 77 | Ht 59.0 in | Wt 181.0 lb

## 2014-11-29 DIAGNOSIS — E785 Hyperlipidemia, unspecified: Secondary | ICD-10-CM | POA: Diagnosis not present

## 2014-11-29 DIAGNOSIS — R413 Other amnesia: Secondary | ICD-10-CM

## 2014-11-29 DIAGNOSIS — I10 Essential (primary) hypertension: Secondary | ICD-10-CM

## 2014-11-29 DIAGNOSIS — E119 Type 2 diabetes mellitus without complications: Secondary | ICD-10-CM | POA: Insufficient documentation

## 2014-11-29 LAB — VITAMIN B12: VITAMIN B 12: 694 pg/mL (ref 211–911)

## 2014-11-29 NOTE — Progress Notes (Signed)
NEUROLOGY CONSULTATION NOTE  [name] MRN: [id number] DOB: May 30, 1940  Referring provider: Dr. Scarlette Calico Primary care provider: Dr. Scarlette Calico  Reason for consult:  forgetfulness  Dear Dr Ronnald Ramp:  Thank you for your kind referral of [name] for consultation of the above symptoms. Although her history is well known to you, please allow me to reiterate it for the purpose of our medical record. Records and images were personally reviewed where available.  HISTORY OF PRESENT ILLNESS: This is a very pleasant 74 year old right-handed woman with a history of hypertension, hyperlipidemia, diabetes, sleep apnea, hypothyroidism, anxiety, presenting for evaluation of forgetfulness. She noticed that around July, she could not finish her sentences, she could get to the very end then would be unable to say the last word. She lives by herself and is now planning to move to [place]. She reports being under a lot of stress trying to move. Her son came to help, and she reports this is stressing her out more. She may be repeating herself, she would ask friends if she told them a story already, they would say yes but a while back. She continues to drive without getting lost. She denies any missed bills or medications. She denies misplacing things at home. She would like to go back to school for her Masters or certification in counseling. She was adopted and does not know much of her birth family history. She denies any significant head injuries, no alcohol intake.  She has some left frontal headaches due to sinus issues, no associated nausea, vomiting, photo/phonophobia. She has occasional dizziness upon standing, noting her BP is "up and down." She has some left-sided neck pain. Otherwise, she denies any diplopia, dysarthria, dysphagia, neck/back pain, focal numbness/tingling/weakness, bowel/bladder dysfunction. No anosmia, tremors, no falls.  Laboratory Data: Lab Results  Component Value Date    TSH 1.48 10/11/2014    PAST MEDICAL HISTORY: Past Medical History  Diagnosis Date  . Hypertension   . Hypothyroidism   . Sleep apnea     Dr Gwenette Greet CPAP 2009  . Depression   . Diabetes mellitus type II     diet  . Allergic rhinitis   . Anxiety   . Chronic cough     Dr Melvyn Novas, onset 11/2007, Sinus CT July 7,2010>neg, Resolved 10/2008 on ppi two times a day  . Obesity (BMI 30-39.9)   . Pneumonia   . Hiatal hernia   . GERD (gastroesophageal reflux disease)   . Dyslipidemia   . Lung nodule   . Diverticulosis   . Colon polyp   . Arthritis     PAST SURGICAL HISTORY: Past Surgical History  Procedure Laterality Date  . Left breast lumpectomy    . Tonsillectomy      x 2  . Cholecystectomy      1998 in [place]  . Rectocele repair    . Bladder suspension    . Ventral hernia repair    . Appendectomy    . Abdominal hysterectomy    . Nissen fundoplication      March '11 Dr Johney Maine  . Eye surgery  10/13    and 12/13; cataract surgery    MEDICATIONS: Current Outpatient Prescriptions on File Prior to Visit  Medication Sig Dispense Refill  . ALPRAZolam (XANAX) 0.25 MG tablet Take 1 tablet (0.25 mg total) by mouth 2 (two) times daily as needed. 30 tablet 2  . aspirin 81 MG tablet Take 81 mg by mouth daily.    Marland Kitchen  azelastine (ASTELIN) 0.1 % nasal spray Place 1 spray into both nostrils 2 (two) times daily. Use in each nostril as directed 30 mL 11  . Calcium Carbonate-Vitamin D (CALCIUM 600+D) 600-200 MG-UNIT TABS Take by mouth daily.      . carvedilol (COREG) 3.125 MG tablet Take 1 tablet (3.125 mg total) by mouth 2 (two) times daily with a meal. 180 tablet 3  . Cinnamon 500 MG TABS Take by mouth daily.      Marland Kitchen diltiazem (DILT-CD) 180 MG 24 hr capsule Take 1 capsule (180 mg total) by mouth every evening. 90 capsule 3  . FLUoxetine (PROZAC) 20 MG capsule TAKE 1 CAPSULE AT BEDTIME 90 capsule 3  . fluticasone (FLONASE) 50 MCG/ACT nasal spray Place 2 sprays into both nostrils daily. 16 g  11  . Ginkgo Biloba 40 MG TABS Take by mouth daily.      . indapamide (LOZOL) 2.5 MG tablet Take 1 tablet (2.5 mg total) by mouth daily. 90 tablet 1  . levothyroxine (SYNTHROID) 75 MCG tablet Take 1 tablet (75 mcg total) by mouth daily. 90 tablet 2  . loratadine (CLARITIN) 10 MG tablet Take 1 tablet (10 mg total) by mouth daily.    Marland Kitchen losartan (COZAAR) 100 MG tablet Take 1 tablet (100 mg total) by mouth daily. 90 tablet 3  . Multiple Vitamins-Minerals (CENTRUM SILVER PO) Take by mouth daily.      . potassium chloride SA (K-DUR,KLOR-CON) 20 MEQ tablet Take 1 tablet (20 mEq total) by mouth daily. 90 tablet 3  . prednisoLONE acetate (PRED FORTE) 1 % ophthalmic suspension PLACE 1 DROP INTO BOTH EYES 4 TIMES A DAY  0  . Probiotic Product (PROBIOTIC & ACIDOPHILUS EX ST PO) Take 1 tablet by mouth daily.    . Red Yeast Rice 600 MG CAPS Take 1 capsule (600 mg total) by mouth daily.    Marland Kitchen triamcinolone cream (KENALOG) 0.5 % Apply 1 application topically 3 (three) times daily. 30 g 1  . UNABLE TO FIND ACCULAR 0.5%--RIGHT EYE    . glucose blood (ONETOUCH VERIO) test strip Test 3 times daily (Patient not taking: Reported on 11/29/2014) 100 each 12  . ONE TOUCH LANCETS MISC Use with One Touch Verio monitor as directed Twice Daily to test blood glucose levels. DX: 250.60 (Patient not taking: Reported on 11/29/2014) 200 each 5   No current facility-administered medications on file prior to visit.    ALLERGIES: Allergies  Allergen Reactions  . Amoxicillin Other (See Comments)    GI Issues.  . Codeine Sulfate   . Hydrochlorothiazide   . Iodine   . Iohexol      Code: HIVES, Desc: PATIENT STATES SHE BREAKS OUT IN HIVES FROM IV DYE 02/27/08/RM, Onset Date: 41962229   . Paroxetine   . Penicillins   . Prednisone     FAMILY HISTORY: Family History  Problem Relation Age of Onset  . Stroke Mother 28  . Lung cancer Father     SOCIAL HISTORY: Social History   Social History  . Marital Status: Single     Spouse Name: N/A  . Number of Children: N/A  . Years of Education: N/A   Occupational History  . retired Systems analyst    Social History Main Topics  . Smoking status: Former Smoker -- 1.50 packs/day for 25 years    Types: Cigarettes    Quit date: 10/28/1981  . Smokeless tobacco: Never Used  . Alcohol Use: No  . Drug Use: No  .  Sexual Activity: Not Currently   Other Topics Concern  . Not on file   Social History Narrative   HSG. Married -divorced. 2 sons. 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 Patient does not get regular exercise. Loves to read. Strong faith. Lives alone.    REVIEW OF SYSTEMS: Constitutional: No fevers, chills, or sweats, no generalized fatigue, change in appetite Eyes: No visual changes, double vision, eye pain Ear, nose and throat: No hearing loss, ear pain, nasal congestion, sore throat Cardiovascular: No chest pain, palpitations Respiratory:  No shortness of breath at rest or with exertion, wheezes GastrointestinaI: No nausea, vomiting, diarrhea, abdominal pain, fecal incontinence Genitourinary:  No dysuria, urinary retention or frequency Musculoskeletal:  + neck pain,no back pain Integumentary: No rash, pruritus, skin lesions Neurological: as above Psychiatric: No depression, insomnia, anxiety Endocrine: No palpitations, fatigue, diaphoresis, mood swings, change in appetite, change in weight, increased thirst Hematologic/Lymphatic:  No anemia, purpura, petechiae. Allergic/Immunologic: no itchy/runny eyes, nasal congestion, recent allergic reactions, rashes  PHYSICAL EXAM: Filed Vitals:   11/29/14 1238  BP: 118/72  Pulse: 77   General: No acute distress Head:  Normocephalic/atraumatic Eyes: Fundoscopic exam shows bilateral sharp discs, no vessel changes, exudates, or hemorrhages Neck: supple, no paraspinal tenderness, full range of motion Back: No paraspinal tenderness Heart: regular rate and rhythm Lungs: Clear to auscultation  bilaterally. Vascular: No carotid bruits. Skin/Extremities: No rash, no edema Neurological Exam: Mental status: alert and oriented to person, place, and time, no dysarthria or aphasia, Fund of knowledge is appropriate.  Recent and remote memory are intact.  Attention and concentration are normal.    Able to name objects and repeat phrases. Clock drawing 5/5. MMSE - Mini Mental State Exam 11/29/2014  Orientation to time 5  Orientation to Place 5  Registration 3  Attention/ Calculation 5  Recall 3  Language- name 2 objects 2  Language- repeat 1  Language- follow 3 step command 3  Language- read & follow direction 1  Write a sentence 1  Copy design 1  Total score 30   Cranial nerves: CN I: not tested CN II: pupils equal, round and reactive to light, visual fields intact, fundi unremarkable. CN III, IV, VI:  full range of motion, no nystagmus, no ptosis CN V: reports hypersensitivity to light touch on left V1, decreased cold on right V1-3, intact to pin CN VII: upper and lower face symmetric CN VIII: hearing intact to finger rub CN IX, X: gag intact, uvula midline CN XI: sternocleidomastoid and trapezius muscles intact CN XII: tongue midline Bulk & Tone: normal, no fasciculations. Motor: 5/5 throughout with no pronator drift. Sensation: intact to light touch, cold, pin, vibration and joint position sense, reports left shin tenderness to palpation. No extinction to double simultaneous stimulation.  Romberg test negative Deep Tendon Reflexes: +2 both UE and +1 both LE, no ankle clonus Plantar responses: downgoing bilaterally Cerebellar: no incoordination on finger to nose, heel to shin. No dysdiadochokinesia Gait: narrow-based and steady, able to tandem walk adequately. Tremor: none  IMPRESSION: This is a pleasant 74 year old right-handed woman with a history of hypertension, hyperlipidemia, diabetes, sleep apnea, anxiety, presenting for evaluation of forgetfulness that she started  noticing 2 months ago. Her MMSE today is normal 30/30, neurological exam showed some sensory changes in the face, otherwise non-focal. We discussed different causes of memory loss. Check B12. We discussed effects of mood on memory, particularly with symptoms occurring at this stressful time with her plan to move. No indication  to start cholinesterase inhibitors at this time. We discussed that if symptoms worsen after things have settled down with her move, consideration for brain imaging can be done.  We discussed the importance of control of vascular risk factors, physical exercise, and brain stimulation exercises for brain health. If still in [place], she will follow-up in 6 months, otherwise she will plan to establish care with new physicians in [place].   Thank you for allowing me to participate in the care of this patient. Please do not hesitate to call for any questions or concerns.   Ellouise Newer, M.D.  CC: Dr. Ronnald Ramp

## 2014-11-29 NOTE — Patient Instructions (Signed)
1. Bloodwork for B12 2. Control of BP, cholesterol, and diabetes, as well as physical exercise and brain stimulation exercises are important for brain health 3. If still in [place] in 6 months, schedule follow-up. Otherwise, have a safe move to [place] and establish care with new doctors once you get settled.

## 2014-12-01 DIAGNOSIS — Z961 Presence of intraocular lens: Secondary | ICD-10-CM | POA: Diagnosis not present

## 2014-12-01 DIAGNOSIS — H10013 Acute follicular conjunctivitis, bilateral: Secondary | ICD-10-CM | POA: Diagnosis not present

## 2014-12-02 ENCOUNTER — Telehealth: Payer: Self-pay | Admitting: Family Medicine

## 2014-12-02 NOTE — Telephone Encounter (Signed)
Patient was notified of results.  

## 2014-12-02 NOTE — Telephone Encounter (Signed)
-----   Message from Cameron Sprang, MD sent at 11/30/2014  9:48 AM EDT ----- Pls let her know B12 level is good, no need for B12 injections. Thanks

## 2014-12-06 ENCOUNTER — Ambulatory Visit: Payer: Medicare Other | Admitting: Neurology

## 2014-12-06 ENCOUNTER — Other Ambulatory Visit: Payer: Self-pay

## 2014-12-06 DIAGNOSIS — Z1231 Encounter for screening mammogram for malignant neoplasm of breast: Secondary | ICD-10-CM

## 2014-12-23 DIAGNOSIS — H2000 Unspecified acute and subacute iridocyclitis: Secondary | ICD-10-CM | POA: Diagnosis not present

## 2014-12-23 DIAGNOSIS — H40013 Open angle with borderline findings, low risk, bilateral: Secondary | ICD-10-CM | POA: Diagnosis not present

## 2014-12-23 DIAGNOSIS — H10013 Acute follicular conjunctivitis, bilateral: Secondary | ICD-10-CM | POA: Diagnosis not present

## 2014-12-23 DIAGNOSIS — H5711 Ocular pain, right eye: Secondary | ICD-10-CM | POA: Diagnosis not present

## 2014-12-28 ENCOUNTER — Ambulatory Visit (INDEPENDENT_AMBULATORY_CARE_PROVIDER_SITE_OTHER): Payer: Medicare Other | Admitting: Internal Medicine

## 2014-12-28 ENCOUNTER — Ambulatory Visit (INDEPENDENT_AMBULATORY_CARE_PROVIDER_SITE_OTHER)
Admission: RE | Admit: 2014-12-28 | Discharge: 2014-12-28 | Disposition: A | Discharge status: Home / Self Care | Payer: Medicare Other | Source: Ambulatory Visit | Attending: Internal Medicine | Admitting: Internal Medicine

## 2014-12-28 ENCOUNTER — Encounter: Payer: Self-pay | Admitting: Internal Medicine

## 2014-12-28 VITALS — BP 104/62 | HR 81 | Temp 98.0°F | Resp 16 | Ht 59.0 in | Wt 182.0 lb

## 2014-12-28 DIAGNOSIS — M25511 Pain in right shoulder: Secondary | ICD-10-CM | POA: Diagnosis not present

## 2014-12-28 DIAGNOSIS — Z780 Asymptomatic menopausal state: Secondary | ICD-10-CM

## 2014-12-28 DIAGNOSIS — M19011 Primary osteoarthritis, right shoulder: Secondary | ICD-10-CM

## 2014-12-28 DIAGNOSIS — M19012 Primary osteoarthritis, left shoulder: Secondary | ICD-10-CM | POA: Diagnosis not present

## 2014-12-28 DIAGNOSIS — M542 Cervicalgia: Secondary | ICD-10-CM

## 2014-12-28 DIAGNOSIS — M5030 Other cervical disc degeneration, unspecified cervical region: Secondary | ICD-10-CM | POA: Diagnosis not present

## 2014-12-28 MED ORDER — METHYLPREDNISOLONE ACETATE 40 MG/ML IJ SUSP: 40.0000 mg | Freq: Once | INTRAMUSCULAR | Status: DC

## 2014-12-28 MED ORDER — METHYLPREDNISOLONE ACETATE 80 MG/ML IJ SUSP
40.0000 mg | Freq: Once | INTRAMUSCULAR | Status: AC
  Administered 2014-12-28: 40 mg via INTRA_ARTICULAR

## 2014-12-28 NOTE — Patient Instructions (Signed)

## 2014-12-28 NOTE — Progress Notes (Signed)
Pre visit review using our clinic review tool, if applicable. No additional management support is needed unless otherwise documented below in the visit note. 

## 2014-12-29 ENCOUNTER — Encounter: Payer: Self-pay | Admitting: Internal Medicine

## 2014-12-29 LAB — HM DEXA SCAN: HM Dexa Scan: -1.1

## 2014-12-30 DIAGNOSIS — M19019 Primary osteoarthritis, unspecified shoulder: Secondary | ICD-10-CM | POA: Insufficient documentation

## 2014-12-30 NOTE — Progress Notes (Signed)
Subjective:  Patient ID: [name], female    DOB: 22-Jan-1941  Age: 74 y.o. MRN: [id number]  CC: Neck Pain and Shoulder Pain   HPI Jenilee Hogrefe presents for recurrent episodes of right-sided neck pain and right shoulder pain. There has not been any recent trauma or injury. The neck pain radiates to the right trapezium area.  Outpatient Prescriptions Prior to Visit  Medication Sig Dispense Refill  . ALPRAZolam (XANAX) 0.25 MG tablet Take 1 tablet (0.25 mg total) by mouth 2 (two) times daily as needed. 30 tablet 2  . aspirin 81 MG tablet Take 81 mg by mouth daily.    Marland Kitchen azelastine (ASTELIN) 0.1 % nasal spray Place 1 spray into both nostrils 2 (two) times daily. Use in each nostril as directed 30 mL 11  . Calcium Carbonate-Vitamin D (CALCIUM 600+D) 600-200 MG-UNIT TABS Take by mouth daily.      . carvedilol (COREG) 3.125 MG tablet Take 1 tablet (3.125 mg total) by mouth 2 (two) times daily with a meal. 180 tablet 3  . Cinnamon 500 MG TABS Take by mouth daily.      Marland Kitchen diltiazem (DILT-CD) 180 MG 24 hr capsule Take 1 capsule (180 mg total) by mouth every evening. 90 capsule 3  . FLUoxetine (PROZAC) 20 MG capsule TAKE 1 CAPSULE AT BEDTIME 90 capsule 3  . fluticasone (FLONASE) 50 MCG/ACT nasal spray Place 2 sprays into both nostrils daily. 16 g 11  . Ginkgo Biloba 40 MG TABS Take by mouth daily.      Marland Kitchen glucose blood (ONETOUCH VERIO) test strip Test 3 times daily 100 each 12  . indapamide (LOZOL) 2.5 MG tablet Take 1 tablet (2.5 mg total) by mouth daily. 90 tablet 1  . levothyroxine (SYNTHROID) 75 MCG tablet Take 1 tablet (75 mcg total) by mouth daily. 90 tablet 2  . loratadine (CLARITIN) 10 MG tablet Take 1 tablet (10 mg total) by mouth daily.    Marland Kitchen losartan (COZAAR) 100 MG tablet Take 1 tablet (100 mg total) by mouth daily. 90 tablet 3  . Multiple Vitamins-Minerals (CENTRUM SILVER PO) Take by mouth daily.      . ONE TOUCH LANCETS MISC Use with One Touch Verio monitor as directed Twice Daily  to test blood glucose levels. DX: 250.60 200 each 5  . potassium chloride SA (K-DUR,KLOR-CON) 20 MEQ tablet Take 1 tablet (20 mEq total) by mouth daily. 90 tablet 3  . prednisoLONE acetate (PRED FORTE) 1 % ophthalmic suspension PLACE 1 DROP INTO BOTH EYES 4 TIMES A DAY  0  . Probiotic Product (PROBIOTIC & ACIDOPHILUS EX ST PO) Take 1 tablet by mouth daily.    . Red Yeast Rice 600 MG CAPS Take 1 capsule (600 mg total) by mouth daily.    Marland Kitchen triamcinolone cream (KENALOG) 0.5 % Apply 1 application topically 3 (three) times daily. 30 g 1  . UNABLE TO FIND ACCULAR 0.5%--RIGHT EYE     No facility-administered medications prior to visit.    ROS Review of Systems  Constitutional: Negative.  Negative for fever, chills, diaphoresis, appetite change and fatigue.  HENT: Negative.   Eyes: Negative.   Respiratory: Negative.  Negative for cough, choking, shortness of breath and stridor.   Cardiovascular: Negative.  Negative for chest pain, palpitations and leg swelling.  Gastrointestinal: Negative.  Negative for nausea, vomiting, abdominal pain, diarrhea, constipation and blood in stool.  Endocrine: Negative.   Genitourinary: Negative.   Musculoskeletal: Positive for arthralgias (rt shoulder) and neck pain.  Negative for myalgias, back pain, joint swelling, gait problem and neck stiffness.  Skin: Negative.  Negative for color change and rash.  Allergic/Immunologic: Negative.   Neurological: Negative.  Negative for dizziness, weakness and numbness.  Hematological: Negative.  Negative for adenopathy. Does not bruise/bleed easily.  Psychiatric/Behavioral: Negative.     Objective:  BP 104/62 mmHg  Pulse 81  Temp(Src) 98 F (36.7 C) (Oral)  Resp 16  Ht 4\' 11"  (1.499 m)  Wt 182 lb (82.555 kg)  BMI 36.74 kg/m2  SpO2 95%  BP Readings from Last 3 Encounters:  12/28/14 104/62  11/29/14 118/72  10/11/14 132/84    Wt Readings from Last 3 Encounters:  12/28/14 182 lb (82.555 kg)  11/29/14 181 lb  (82.101 kg)  06/23/14 179 lb (81.194 kg)    Physical Exam  Constitutional: She is oriented to person, place, and time. She appears well-developed and well-nourished. No distress.  HENT:  Head: Normocephalic and atraumatic.  Mouth/Throat: Oropharynx is clear and moist. No oropharyngeal exudate.  Eyes: Conjunctivae are normal. Right eye exhibits no discharge. Left eye exhibits no discharge. No scleral icterus.  Neck: Normal range of motion. Neck supple. No JVD present. No tracheal deviation present. No thyromegaly present.  Cardiovascular: Normal rate, regular rhythm, normal heart sounds and intact distal pulses.  Exam reveals no gallop and no friction rub.   No murmur heard. Pulmonary/Chest: Effort normal and breath sounds normal. No stridor. No respiratory distress. She has no wheezes. She has no rales. She exhibits no tenderness.  Abdominal: Soft. Bowel sounds are normal. She exhibits no distension and no mass. There is no tenderness. There is no rebound and no guarding.  Musculoskeletal: Normal range of motion. She exhibits no edema or tenderness.       Right shoulder: She exhibits pain (in the A/C joint). She exhibits normal range of motion, no bony tenderness, no swelling, no effusion, no crepitus, no deformity, no laceration, no spasm, normal pulse and normal strength. Tenderness: in the A/C joint.       Cervical back: Normal. She exhibits normal range of motion, no tenderness, no bony tenderness, no swelling, no edema, no deformity, no laceration, no pain, no spasm and normal pulse.  Rt a/c joint was cleaned with betadine then the joint was injected with 40 mg of depo-medrol and 0.5 cc of 0.5% plain marcaine, she tolerated this well with no blood loss or complications.  Lymphadenopathy:    She has no cervical adenopathy.  Neurological: She is alert and oriented to person, place, and time. She has normal strength. She displays no atrophy, no tremor and normal reflexes. No cranial nerve  deficit or sensory deficit. She exhibits normal muscle tone. She displays a negative Romberg sign. She displays no seizure activity. Coordination and gait normal.  Reflex Scores:      Tricep reflexes are 1+ on the right side and 1+ on the left side.      Bicep reflexes are 1+ on the right side and 1+ on the left side.      Brachioradialis reflexes are 1+ on the right side and 1+ on the left side.      Patellar reflexes are 1+ on the right side and 1+ on the left side.      Achilles reflexes are 1+ on the right side and 1+ on the left side. Skin: Skin is warm and dry. No rash noted. She is not diaphoretic. No erythema. No pallor.  Vitals reviewed.   Lab Results  Component Value Date   WBC 7.8 09/17/2013   HGB 11.6* 09/17/2013   HCT 34.9* 09/17/2013   PLT 330.0 09/17/2013   GLUCOSE 85 10/11/2014   CHOL 198 10/11/2014   TRIG 99.0 10/11/2014   HDL 61.70 10/11/2014   LDLDIRECT 120.6 11/24/2012   LDLCALC 117* 10/11/2014   ALT 32 11/24/2012   ALT 32 11/24/2012   AST 27 11/24/2012   AST 27 11/24/2012   NA 139 10/11/2014   K 3.4* 10/11/2014   CL 103 10/11/2014   CREATININE 1.00 10/11/2014   BUN 20 10/11/2014   CO2 29 10/11/2014   TSH 1.48 10/11/2014   INR 1.0 10/01/2011   HGBA1C 6.5 10/11/2014   MICROALBUR 0.8 06/23/2014    Dg Cervical Spine Complete  12/28/2014  CLINICAL DATA:  Three weeks of neck and right shoulder pain extending into the right arm without known injury. EXAM: CERVICAL SPINE  4+ VIEWS COMPARISON:  None in PACs FINDINGS: The cervical vertebral bodies are preserved in height. There is mild disc space narrowing at C5-6 and at C6-7. There small anterior endplate osteophytes from C3 inferiorly. There is no perched facet. The spinous processes are intact. Only 1 oblique view is submitted and is suboptimally positioned. IMPRESSION: There is mild degenerative disc and facet joint change at multiple levels. There is no compression fracture nor spondylolisthesis. Given the  patient's radicular symptoms, cervical spine MRI may be useful if the patient can undergo the procedure. Electronically Signed   By: David  Martinique M.D.   On: 12/28/2014 16:18   Dg Shoulder Right  12/28/2014  CLINICAL DATA:  Right shoulder and neck pain for 3 weeks, no injury EXAM: RIGHT SHOULDER - 2+ VIEW COMPARISON:  None. FINDINGS: The right humeral head is in normal position and the glenohumeral joint space appears normal. The right AC joint is normally aligned. No significant abnormality is seen. IMPRESSION: Negative. Electronically Signed   By: Ivar Drape M.D.   On: 12/28/2014 16:12   Dg Bone Density  12/29/2014  Date of study: 12/28/2014 Exam: DUAL X-RAY ABSORPTIOMETRY (DXA) FOR BONE MINERAL DENSITY (BMD) Instrument: [employer] [facility] Provider: PCP Indication: Screening for low BMD Comparison: none (please note that it is not possible to compare data from different instruments) Clinical data: Pt is a postmenopausal 74 y.o. female without previous h/o fracture. On calcium and vitamin D. Results:  Lumbar spine (L1-L4) Femoral neck (FN) T-score  -1.1  RFN: -0.4 LFN: -1.1  Assessment: the BMD is low according to the [facility] classification for osteoporosis (see below). Fracture risk: moderate FRAX score: 10 year major osteoporotic risk: 4.1 %. 10 year hip fracture risk: 0.6 %. These are under the thresholds for treatment of 20% and 3%, respectively. Comments: the technical quality of the study is good. Evaluation for secondary causes should be considered if clinically indicated. Recommend optimizing calcium (1200 mg/day) and vitamin D (800 IU/day) intake. Followup: Repeat BMD is appropriate after 2 years WHO criteria for diagnosis of osteoporosis in postmenopausal women and in men 39 y/o or older: - normal: T-score -1.0 to + 1.0 - osteopenia/low bone density: T-score between -2.5 and -1.0 - osteoporosis: T-score below -2.5 - severe osteoporosis: T-score below -2.5 with history of fragility  fracture Note: although not part of the WHO classification, the presence of a fragility fracture, regardless of the T-score, should be considered diagnostic of osteoporosis, provided other causes for the fracture have been excluded. Treatment: The National Osteoporosis Foundation recommends that treatment be considered in postmenopausal women  and men age 79 or older with: 1. Hip or vertebral (clinical or morphometric) fracture 2. T-score of - 2.5 or lower at the spine or hip 3. 10-year fracture probability by FRAX of at least 20% for a major osteoporotic fracture and 3% for a hip fracture Philemon Kingdom, MD [facility]    Assessment & Plan:   Mckensi was seen today for neck pain and shoulder pain.  Diagnoses and all orders for this visit:  Neck pain on right side- plain film is + for DDD, she will let me know if she wants to proceed with an MRI -     DG Cervical Spine Complete; Future  Asymptomatic menopausal state -     DG Bone Density; Future  Right shoulder pain- plain films are normal, she was given an IA depo injection for this -     DG Shoulder Right; Future -     methylPREDNISolone acetate (DEPO-MEDROL) injection 40 mg; Inject 0.5 mLs (40 mg total) into the articular space once. -     Discontinue: methylPREDNISolone acetate (DEPO-MEDROL) injection 40 mg; Inject 1 mL (40 mg total) into the muscle once.   I have discontinued Ms. Dickard's atorvastatin. I am also having her maintain her Calcium Carbonate-Vitamin D, Multiple Vitamins-Minerals (CENTRUM SILVER PO), Cinnamon, Ginkgo Biloba, ONE TOUCH LANCETS, aspirin, Probiotic Product (PROBIOTIC & ACIDOPHILUS EX ST PO), Red Yeast Rice, UNABLE TO FIND, triamcinolone cream, loratadine, losartan, diltiazem, fluticasone, azelastine, glucose blood, ALPRAZolam, potassium chloride SA, indapamide, carvedilol, FLUoxetine, levothyroxine, and prednisoLONE acetate. We administered methylPREDNISolone acetate.  Meds ordered this encounter    Medications  . DISCONTD: atorvastatin (LIPITOR) 20 MG tablet    Sig:   . methylPREDNISolone acetate (DEPO-MEDROL) injection 40 mg    Sig:   . DISCONTD: methylPREDNISolone acetate (DEPO-MEDROL) injection 40 mg    Sig:      Follow-up: Return in about 4 weeks (around 01/25/2015).  Scarlette Calico, MD

## 2014-12-30 NOTE — Assessment & Plan Note (Signed)
The joint was injected with depo-medrol to relieve her pain

## 2015-01-07 ENCOUNTER — Other Ambulatory Visit: Payer: Medicare Other

## 2015-01-12 ENCOUNTER — Ambulatory Visit (INDEPENDENT_AMBULATORY_CARE_PROVIDER_SITE_OTHER): Payer: Commercial Managed Care - HMO | Admitting: Ophthalmology

## 2015-01-24 ENCOUNTER — Ambulatory Visit (INDEPENDENT_AMBULATORY_CARE_PROVIDER_SITE_OTHER): Payer: Medicare Other | Admitting: Ophthalmology

## 2015-01-24 ENCOUNTER — Other Ambulatory Visit: Payer: Self-pay | Admitting: Internal Medicine

## 2015-01-24 DIAGNOSIS — H43813 Vitreous degeneration, bilateral: Secondary | ICD-10-CM

## 2015-01-24 DIAGNOSIS — I10 Essential (primary) hypertension: Secondary | ICD-10-CM

## 2015-01-24 DIAGNOSIS — E11311 Type 2 diabetes mellitus with unspecified diabetic retinopathy with macular edema: Secondary | ICD-10-CM | POA: Diagnosis not present

## 2015-01-24 DIAGNOSIS — H35343 Macular cyst, hole, or pseudohole, bilateral: Secondary | ICD-10-CM | POA: Diagnosis not present

## 2015-01-24 DIAGNOSIS — E113213 Type 2 diabetes mellitus with mild nonproliferative diabetic retinopathy with macular edema, bilateral: Secondary | ICD-10-CM | POA: Diagnosis not present

## 2015-01-24 DIAGNOSIS — H35033 Hypertensive retinopathy, bilateral: Secondary | ICD-10-CM

## 2015-02-12 ENCOUNTER — Other Ambulatory Visit: Payer: Self-pay

## 2015-02-12 DIAGNOSIS — F411 Generalized anxiety disorder: Secondary | ICD-10-CM

## 2015-02-12 NOTE — Telephone Encounter (Signed)
Ok to fill pended med? 

## 2015-02-13 MED ORDER — ALPRAZOLAM 0.25 MG PO TABS: 0.2500 mg | ORAL_TABLET | Freq: Two times a day (BID) | ORAL | Status: DC | PRN

## 2015-02-14 NOTE — Telephone Encounter (Signed)
fxd

## 2015-02-21 ENCOUNTER — Other Ambulatory Visit: Payer: Self-pay

## 2015-02-21 DIAGNOSIS — F411 Generalized anxiety disorder: Secondary | ICD-10-CM

## 2015-02-21 MED ORDER — ALPRAZOLAM 0.25 MG PO TABS: 0.2500 mg | ORAL_TABLET | Freq: Two times a day (BID) | ORAL | Status: DC | PRN

## 2015-02-21 NOTE — Telephone Encounter (Signed)
Patient requesting refill on Alprazolam 0.25 MG. Please advise

## 2015-03-08 ENCOUNTER — Ambulatory Visit (INDEPENDENT_AMBULATORY_CARE_PROVIDER_SITE_OTHER)
Admission: RE | Admit: 2015-03-08 | Discharge: 2015-03-08 | Disposition: A | Discharge status: Home / Self Care | Payer: Medicare Other | Source: Ambulatory Visit | Attending: Internal Medicine | Admitting: Internal Medicine

## 2015-03-08 ENCOUNTER — Encounter: Payer: Self-pay | Admitting: Internal Medicine

## 2015-03-08 ENCOUNTER — Ambulatory Visit (INDEPENDENT_AMBULATORY_CARE_PROVIDER_SITE_OTHER): Payer: Medicare Other | Admitting: Internal Medicine

## 2015-03-08 ENCOUNTER — Other Ambulatory Visit (INDEPENDENT_AMBULATORY_CARE_PROVIDER_SITE_OTHER): Payer: Medicare Other

## 2015-03-08 VITALS — BP 110/70 | HR 77 | Temp 97.8°F | Resp 16 | Ht 59.0 in | Wt 181.0 lb

## 2015-03-08 DIAGNOSIS — R05 Cough: Secondary | ICD-10-CM

## 2015-03-08 DIAGNOSIS — I10 Essential (primary) hypertension: Secondary | ICD-10-CM | POA: Diagnosis not present

## 2015-03-08 DIAGNOSIS — E118 Type 2 diabetes mellitus with unspecified complications: Secondary | ICD-10-CM | POA: Diagnosis not present

## 2015-03-08 DIAGNOSIS — E038 Other specified hypothyroidism: Secondary | ICD-10-CM | POA: Diagnosis not present

## 2015-03-08 DIAGNOSIS — R911 Solitary pulmonary nodule: Secondary | ICD-10-CM

## 2015-03-08 DIAGNOSIS — R059 Cough, unspecified: Secondary | ICD-10-CM

## 2015-03-08 DIAGNOSIS — J209 Acute bronchitis, unspecified: Secondary | ICD-10-CM

## 2015-03-08 DIAGNOSIS — R0602 Shortness of breath: Secondary | ICD-10-CM | POA: Diagnosis not present

## 2015-03-08 LAB — CBC WITH DIFFERENTIAL/PLATELET
BASOS PCT: 0.6 % (ref 0.0–3.0)
Basophils Absolute: 0 10*3/uL (ref 0.0–0.1)
EOS ABS: 0.2 10*3/uL (ref 0.0–0.7)
Eosinophils Relative: 2.6 % (ref 0.0–5.0)
HCT: 37.5 % (ref 36.0–46.0)
HEMOGLOBIN: 12.3 g/dL (ref 12.0–15.0)
LYMPHS ABS: 2.1 10*3/uL (ref 0.7–4.0)
Lymphocytes Relative: 26 % (ref 12.0–46.0)
MCHC: 32.8 g/dL (ref 30.0–36.0)
MCV: 90.3 fl (ref 78.0–100.0)
MONO ABS: 0.8 10*3/uL (ref 0.1–1.0)
Monocytes Relative: 9.2 % (ref 3.0–12.0)
NEUTROS PCT: 61.6 % (ref 43.0–77.0)
Neutro Abs: 5.1 10*3/uL (ref 1.4–7.7)
Platelets: 352 10*3/uL (ref 150.0–400.0)
RBC: 4.15 Mil/uL (ref 3.87–5.11)
RDW: 15.4 % (ref 11.5–15.5)
WBC: 8.3 10*3/uL (ref 4.0–10.5)

## 2015-03-08 LAB — HEMOGLOBIN A1C: HEMOGLOBIN A1C: 6.4 % (ref 4.6–6.5)

## 2015-03-08 LAB — HEPATIC FUNCTION PANEL
ALT: 18 U/L (ref 0–35)
AST: 19 U/L (ref 0–37)
Albumin: 4.3 g/dL (ref 3.5–5.2)
Alkaline Phosphatase: 64 U/L (ref 39–117)
BILIRUBIN DIRECT: 0 mg/dL (ref 0.0–0.3)
BILIRUBIN TOTAL: 0.3 mg/dL (ref 0.2–1.2)
Total Protein: 7.3 g/dL (ref 6.0–8.3)

## 2015-03-08 LAB — BASIC METABOLIC PANEL
BUN: 23 mg/dL (ref 6–23)
CALCIUM: 10 mg/dL (ref 8.4–10.5)
CO2: 29 mEq/L (ref 19–32)
CREATININE: 0.84 mg/dL (ref 0.40–1.20)
Chloride: 103 mEq/L (ref 96–112)
GFR: 84.83 mL/min (ref >60.00)
GLUCOSE: 97 mg/dL (ref 70–99)
Potassium: 3.5 mEq/L (ref 3.5–5.1)
SODIUM: 141 meq/L (ref 135–145)

## 2015-03-08 LAB — TSH: TSH: 2.93 u[IU]/mL (ref 0.35–4.50)

## 2015-03-08 MED ORDER — PROMETHAZINE-DM 6.25-15 MG/5ML PO SYRP: 5.0000 mL | ORAL_SOLUTION | Freq: Four times a day (QID) | ORAL | Status: DC | PRN

## 2015-03-08 MED ORDER — AZITHROMYCIN 500 MG PO TABS: 500.0000 mg | ORAL_TABLET | Freq: Every day | ORAL | Status: DC

## 2015-03-08 NOTE — Progress Notes (Signed)
Subjective:  Patient ID: [name], female    DOB: 02-09-1941  Age: 74 y.o. MRN: [id number]  CC: Cough and Neck Pain   HPI [name] presents for a 1 week hx of cough productive of yellow phlegm.  Outpatient Prescriptions Prior to Visit  Medication Sig Dispense Refill  . ALPRAZolam (XANAX) 0.25 MG tablet Take 1 tablet (0.25 mg total) by mouth 2 (two) times daily as needed. 30 tablet 2  . aspirin 81 MG tablet Take 81 mg by mouth daily.    Marland Kitchen azelastine (ASTELIN) 0.1 % nasal spray Place 1 spray into both nostrils 2 (two) times daily. Use in each nostril as directed 30 mL 11  . Calcium Carbonate-Vitamin D (CALCIUM 600+D) 600-200 MG-UNIT TABS Take by mouth daily.      . carvedilol (COREG) 3.125 MG tablet Take 1 tablet (3.125 mg total) by mouth 2 (two) times daily with a meal. 180 tablet 3  . Cinnamon 500 MG TABS Take by mouth daily.      Marland Kitchen diltiazem (DILT-CD) 180 MG 24 hr capsule Take 1 capsule (180 mg total) by mouth every evening. 90 capsule 3  . FLUoxetine (PROZAC) 20 MG capsule TAKE 1 CAPSULE AT BEDTIME 90 capsule 3  . fluticasone (FLONASE) 50 MCG/ACT nasal spray Place 2 sprays into both nostrils daily. 16 g 11  . Ginkgo Biloba 40 MG TABS Take by mouth daily.      Marland Kitchen glucose blood (ONETOUCH VERIO) test strip Test 3 times daily 100 each 12  . indapamide (LOZOL) 2.5 MG tablet Take 1 tablet by mouth  daily 90 tablet 3  . levothyroxine (SYNTHROID) 75 MCG tablet Take 1 tablet (75 mcg total) by mouth daily. 90 tablet 2  . loratadine (CLARITIN) 10 MG tablet Take 1 tablet (10 mg total) by mouth daily.    Marland Kitchen losartan (COZAAR) 100 MG tablet Take 1 tablet (100 mg total) by mouth daily. 90 tablet 3  . Multiple Vitamins-Minerals (CENTRUM SILVER PO) Take by mouth daily.      . ONE TOUCH LANCETS MISC Use with One Touch Verio monitor as directed Twice Daily to test blood glucose levels. DX: 250.60 200 each 5  . potassium chloride SA (K-DUR,KLOR-CON) 20 MEQ tablet Take 1 tablet (20 mEq total) by  mouth daily. 90 tablet 3  . prednisoLONE acetate (PRED FORTE) 1 % ophthalmic suspension PLACE 1 DROP INTO BOTH EYES 4 TIMES A DAY  0  . Probiotic Product (PROBIOTIC & ACIDOPHILUS EX ST PO) Take 1 tablet by mouth daily.    . Red Yeast Rice 600 MG CAPS Take 1 capsule (600 mg total) by mouth daily.    Marland Kitchen triamcinolone cream (KENALOG) 0.5 % Apply 1 application topically 3 (three) times daily. 30 g 1  . UNABLE TO FIND ACCULAR 0.5%--RIGHT EYE     No facility-administered medications prior to visit.    ROS Review of Systems  Constitutional: Positive for chills. Negative for fever, diaphoresis, activity change, appetite change and fatigue.  HENT: Negative.  Negative for congestion, facial swelling, postnasal drip, sore throat, trouble swallowing and voice change.   Eyes: Negative.   Respiratory: Positive for cough. Negative for apnea, choking, chest tightness, shortness of breath, wheezing and stridor.   Cardiovascular: Negative.  Negative for chest pain, palpitations and leg swelling.  Gastrointestinal: Negative.  Negative for nausea, vomiting, abdominal pain, diarrhea, constipation and blood in stool.  Endocrine: Negative.   Genitourinary: Negative.   Musculoskeletal: Positive for neck pain. Negative for myalgias, back  pain, joint swelling, gait problem and neck stiffness.  Skin: Negative.  Negative for color change, pallor and rash.  Allergic/Immunologic: Negative.   Neurological: Negative.   Hematological: Negative.  Negative for adenopathy. Does not bruise/bleed easily.  Psychiatric/Behavioral: Negative.     Objective:  BP 110/70 mmHg  Pulse 77  Temp(Src) 97.8 F (36.6 C) (Oral)  Resp 16  Ht 4\' 11"  (1.499 m)  Wt 181 lb (82.101 kg)  BMI 36.54 kg/m2  SpO2 98%  BP Readings from Last 3 Encounters:  03/08/15 110/70  12/28/14 104/62  11/29/14 118/72    Wt Readings from Last 3 Encounters:  03/08/15 181 lb (82.101 kg)  12/28/14 182 lb (82.555 kg)  11/29/14 181 lb (82.101 kg)     Physical Exam  Constitutional: She is oriented to person, place, and time.  Non-toxic appearance. She does not have a sickly appearance. She does not appear ill. No distress.  HENT:  Right Ear: Hearing, tympanic membrane, external ear and ear canal normal.  Left Ear: Hearing, tympanic membrane, external ear and ear canal normal.  Mouth/Throat: Oropharynx is clear and moist and mucous membranes are normal. Mucous membranes are not pale, not dry and not cyanotic. No oral lesions. No trismus in the jaw. No uvula swelling. No oropharyngeal exudate, posterior oropharyngeal edema, posterior oropharyngeal erythema or tonsillar abscesses.  Eyes: Conjunctivae are normal. Right eye exhibits no discharge. Left eye exhibits no discharge. No scleral icterus.  Neck: Normal range of motion. Neck supple. No JVD present. No tracheal deviation present. No thyromegaly present.  Cardiovascular: Normal rate, regular rhythm, normal heart sounds and intact distal pulses.  Exam reveals no gallop and no friction rub.   No murmur heard. Pulmonary/Chest: Effort normal and breath sounds normal. No stridor. No respiratory distress. She has no wheezes. She has no rales. She exhibits no tenderness.  Abdominal: Soft. Bowel sounds are normal. She exhibits no distension and no mass. There is no tenderness. There is no rebound and no guarding.  Musculoskeletal: Normal range of motion. She exhibits no edema or tenderness.  Lymphadenopathy:    She has no cervical adenopathy.  Neurological: She is oriented to person, place, and time.  Skin: Skin is warm and dry. No rash noted. She is not diaphoretic. No erythema. No pallor.    Lab Results  Component Value Date   WBC 8.3 03/08/2015   HGB 12.3 03/08/2015   HCT 37.5 03/08/2015   PLT 352.0 03/08/2015   GLUCOSE 97 03/08/2015   CHOL 198 10/11/2014   TRIG 99.0 10/11/2014   HDL 61.70 10/11/2014   LDLDIRECT 120.6 11/24/2012   LDLCALC 117* 10/11/2014   ALT 18 03/08/2015   AST  19 03/08/2015   NA 141 03/08/2015   K 3.5 03/08/2015   CL 103 03/08/2015   CREATININE 0.84 03/08/2015   BUN 23 03/08/2015   CO2 29 03/08/2015   TSH 2.93 03/08/2015   INR 1.0 10/01/2011   HGBA1C 6.4 03/08/2015   MICROALBUR 0.8 06/23/2014    Dg Cervical Spine Complete  12/28/2014  CLINICAL DATA:  Three weeks of neck and right shoulder pain extending into the right arm without known injury. EXAM: CERVICAL SPINE  4+ VIEWS COMPARISON:  None in PACs FINDINGS: The cervical vertebral bodies are preserved in height. There is mild disc space narrowing at C5-6 and at C6-7. There small anterior endplate osteophytes from C3 inferiorly. There is no perched facet. The spinous processes are intact. Only 1 oblique view is submitted and is suboptimally positioned. IMPRESSION:  There is mild degenerative disc and facet joint change at multiple levels. There is no compression fracture nor spondylolisthesis. Given the patient's radicular symptoms, cervical spine MRI may be useful if the patient can undergo the procedure. Electronically Signed   By: David  Martinique M.D.   On: 12/28/2014 16:18   Dg Shoulder Right  12/28/2014  CLINICAL DATA:  Right shoulder and neck pain for 3 weeks, no injury EXAM: RIGHT SHOULDER - 2+ VIEW COMPARISON:  None. FINDINGS: The right humeral head is in normal position and the glenohumeral joint space appears normal. The right AC joint is normally aligned. No significant abnormality is seen. IMPRESSION: Negative. Electronically Signed   By: Ivar Drape M.D.   On: 12/28/2014 16:12   Dg Bone Density  12/29/2014  Date of study: 12/28/2014 Exam: DUAL X-RAY ABSORPTIOMETRY (DXA) FOR BONE MINERAL DENSITY (BMD) Instrument: [employer] Provider: PCP Indication: Screening for low BMD Comparison: none (please note that it is not possible to compare data from different instruments) Clinical data: Pt is a postmenopausal 74 y.o. female without previous h/o fracture. On calcium and  vitamin D. Results:  Lumbar spine (L1-L4) Femoral neck (FN) T-score  -1.1  RFN: -0.4 LFN: -1.1  Assessment: the BMD is low according to the [facility] classification for osteoporosis (see below). Fracture risk: moderate FRAX score: 10 year major osteoporotic risk: 4.1 %. 10 year hip fracture risk: 0.6 %. These are under the thresholds for treatment of 20% and 3%, respectively. Comments: the technical quality of the study is good. Evaluation for secondary causes should be considered if clinically indicated. Recommend optimizing calcium (1200 mg/day) and vitamin D (800 IU/day) intake. Followup: Repeat BMD is appropriate after 2 years WHO criteria for diagnosis of osteoporosis in postmenopausal women and in men 84 y/o or older: - normal: T-score -1.0 to + 1.0 - osteopenia/low bone density: T-score between -2.5 and -1.0 - osteoporosis: T-score below -2.5 - severe osteoporosis: T-score below -2.5 with history of fragility fracture Note: although not part of the WHO classification, the presence of a fragility fracture, regardless of the T-score, should be considered diagnostic of osteoporosis, provided other causes for the fracture have been excluded. Treatment: The National Osteoporosis Foundation recommends that treatment be considered in postmenopausal women and men age 4 or older with: 1. Hip or vertebral (clinical or morphometric) fracture 2. T-score of - 2.5 or lower at the spine or hip 3. 10-year fracture probability by FRAX of at least 20% for a major osteoporotic fracture and 3% for a hip fracture Philemon Kingdom, MD [facility]    Assessment & Plan:   Liyat was seen today for cough and neck pain.  Diagnoses and all orders for this visit:  Cough- her CXR is normal -     promethazine-dextromethorphan (PROMETHAZINE-DM) 6.25-15 MG/5ML syrup; Take 5 mLs by mouth 4 (four) times daily as needed for cough. -     DG Chest 2 View; Future  Other specified hypothyroidism- her TSh is in the normal range,  will remain on the current dose -     TSH; Future  Lung nodule -     DG Chest 2 View; Future  Essential hypertension- her BP is well controlled, lytes and renal function are stable -     Basic metabolic panel; Future -     CBC with Differential/Platelet; Future -     Hepatic function panel; Future  Type 2 diabetes mellitus with complication, without long-term current use of insulin ([place])- her blood sugars  are well controlled -     Basic metabolic panel; Future -     Hemoglobin A1c; Future  Acute bronchitis, unspecified organism- will treat the infection with zithromax -     promethazine-dextromethorphan (PROMETHAZINE-DM) 6.25-15 MG/5ML syrup; Take 5 mLs by mouth 4 (four) times daily as needed for cough. -     azithromycin (ZITHROMAX) 500 MG tablet; Take 1 tablet (500 mg total) by mouth daily.   I am having Ms. Llamas start on promethazine-dextromethorphan and azithromycin. I am also having her maintain her Calcium Carbonate-Vitamin D, Multiple Vitamins-Minerals (CENTRUM SILVER PO), Cinnamon, Ginkgo Biloba, ONE TOUCH LANCETS, aspirin, Probiotic Product (PROBIOTIC & ACIDOPHILUS EX ST PO), Red Yeast Rice, UNABLE TO FIND, triamcinolone cream, loratadine, losartan, diltiazem, fluticasone, azelastine, glucose blood, potassium chloride SA, carvedilol, FLUoxetine, levothyroxine, prednisoLONE acetate, indapamide, and ALPRAZolam.  Meds ordered this encounter  Medications  . promethazine-dextromethorphan (PROMETHAZINE-DM) 6.25-15 MG/5ML syrup    Sig: Take 5 mLs by mouth 4 (four) times daily as needed for cough.    Dispense:  118 mL    Refill:  0  . azithromycin (ZITHROMAX) 500 MG tablet    Sig: Take 1 tablet (500 mg total) by mouth daily.    Dispense:  3 tablet    Refill:  0     Follow-up: Return in about 3 weeks (around 03/29/2015).  Gina Calico, MD

## 2015-03-08 NOTE — Patient Instructions (Signed)

## 2015-03-08 NOTE — Progress Notes (Signed)
Pre visit review using our clinic review tool, if applicable. No additional management support is needed unless otherwise documented below in the visit note. 

## 2015-03-09 ENCOUNTER — Other Ambulatory Visit: Payer: Self-pay

## 2015-03-09 ENCOUNTER — Ambulatory Visit
Admission: RE | Admit: 2015-03-09 | Discharge: 2015-03-09 | Disposition: A | Discharge status: Home / Self Care | Payer: Medicare Other | Source: Ambulatory Visit

## 2015-03-09 DIAGNOSIS — Z1231 Encounter for screening mammogram for malignant neoplasm of breast: Secondary | ICD-10-CM | POA: Diagnosis not present

## 2015-03-10 ENCOUNTER — Telehealth: Payer: Self-pay | Admitting: Internal Medicine

## 2015-03-10 DIAGNOSIS — R05 Cough: Secondary | ICD-10-CM

## 2015-03-10 DIAGNOSIS — R059 Cough, unspecified: Secondary | ICD-10-CM

## 2015-03-10 DIAGNOSIS — J209 Acute bronchitis, unspecified: Secondary | ICD-10-CM

## 2015-03-10 MED ORDER — PROMETHAZINE-DM 6.25-15 MG/5ML PO SYRP: 5.0000 mL | ORAL_SOLUTION | Freq: Four times a day (QID) | ORAL | Status: DC | PRN

## 2015-03-10 MED ORDER — AZITHROMYCIN 500 MG PO TABS: 500.0000 mg | ORAL_TABLET | Freq: Every day | ORAL | Status: DC

## 2015-03-10 NOTE — Telephone Encounter (Signed)
Done

## 2015-03-10 NOTE — Telephone Encounter (Signed)
Pt states the prescriptions for azithromycin (ZITHROMAX) 500 MG tablet [id number] and promethazine-dextromethorphan (PROMETHAZINE-DM) 6.25-15 MG/5ML syrup [id number] Were sent to the mail order. Can you please send them to [facility] on [address].

## 2015-03-11 ENCOUNTER — Encounter: Payer: Self-pay | Admitting: Internal Medicine

## 2015-03-11 LAB — HM MAMMOGRAPHY

## 2015-03-15 ENCOUNTER — Ambulatory Visit: Payer: Medicare Other | Admitting: Internal Medicine

## 2015-04-01 ENCOUNTER — Other Ambulatory Visit: Payer: Self-pay | Admitting: Internal Medicine

## 2015-05-30 ENCOUNTER — Ambulatory Visit: Payer: Medicare Other | Admitting: Neurology

## 2016-02-02 ENCOUNTER — Ambulatory Visit (INDEPENDENT_AMBULATORY_CARE_PROVIDER_SITE_OTHER): Payer: Commercial Managed Care - HMO | Admitting: Ophthalmology

## 2016-08-10 ENCOUNTER — Encounter: Payer: Self-pay | Admitting: Gastroenterology

## 2020-01-07 ENCOUNTER — Telehealth: Payer: Self-pay

## 2020-01-07 NOTE — Telephone Encounter (Signed)
Patient last seen by you on 03/15/2015 before she moved and they were wondering if it was okay to re-establish care. Just let me know. Thank you

## 2020-01-07 NOTE — Telephone Encounter (Signed)
   Scheduler left message for patient to call for appointment with Dr Ronnald Ramp

## 2020-01-16 ENCOUNTER — Emergency Department (HOSPITAL_COMMUNITY): Payer: Medicare HMO

## 2020-01-16 ENCOUNTER — Emergency Department (HOSPITAL_COMMUNITY)
Admission: EM | Admit: 2020-01-16 | Discharge: 2020-01-16 | Disposition: A | Discharge status: Home / Self Care | Payer: Medicare HMO | Attending: Emergency Medicine | Admitting: Emergency Medicine

## 2020-01-16 DIAGNOSIS — E039 Hypothyroidism, unspecified: Secondary | ICD-10-CM | POA: Diagnosis not present

## 2020-01-16 DIAGNOSIS — Z79899 Other long term (current) drug therapy: Secondary | ICD-10-CM | POA: Insufficient documentation

## 2020-01-16 DIAGNOSIS — Z7982 Long term (current) use of aspirin: Secondary | ICD-10-CM | POA: Diagnosis not present

## 2020-01-16 DIAGNOSIS — F0390 Unspecified dementia without behavioral disturbance: Secondary | ICD-10-CM | POA: Diagnosis not present

## 2020-01-16 DIAGNOSIS — W1830XA Fall on same level, unspecified, initial encounter: Secondary | ICD-10-CM | POA: Diagnosis not present

## 2020-01-16 DIAGNOSIS — M25562 Pain in left knee: Secondary | ICD-10-CM | POA: Insufficient documentation

## 2020-01-16 DIAGNOSIS — E876 Hypokalemia: Secondary | ICD-10-CM

## 2020-01-16 DIAGNOSIS — W19XXXA Unspecified fall, initial encounter: Secondary | ICD-10-CM

## 2020-01-16 DIAGNOSIS — Z87891 Personal history of nicotine dependence: Secondary | ICD-10-CM | POA: Insufficient documentation

## 2020-01-16 DIAGNOSIS — E119 Type 2 diabetes mellitus without complications: Secondary | ICD-10-CM | POA: Insufficient documentation

## 2020-01-16 DIAGNOSIS — I10 Essential (primary) hypertension: Secondary | ICD-10-CM | POA: Diagnosis not present

## 2020-01-16 DIAGNOSIS — S0990XA Unspecified injury of head, initial encounter: Secondary | ICD-10-CM | POA: Diagnosis not present

## 2020-01-16 LAB — CBC WITH DIFFERENTIAL/PLATELET
Abs Immature Granulocytes: 0.05 10*3/uL (ref 0.00–0.07)
Basophils Absolute: 0 10*3/uL (ref 0.0–0.1)
Basophils Relative: 0 %
Eosinophils Absolute: 0.1 10*3/uL (ref 0.0–0.5)
Eosinophils Relative: 1 %
HCT: 36.2 % (ref 36.0–46.0)
Hemoglobin: 11.9 g/dL — ABNORMAL LOW (ref 12.0–15.0)
Immature Granulocytes: 1 %
Lymphocytes Relative: 10 %
Lymphs Abs: 0.9 10*3/uL (ref 0.7–4.0)
MCH: 30.7 pg (ref 26.0–34.0)
MCHC: 32.9 g/dL (ref 30.0–36.0)
MCV: 93.5 fL (ref 80.0–100.0)
Monocytes Absolute: 0.8 10*3/uL (ref 0.1–1.0)
Monocytes Relative: 9 %
Neutro Abs: 7.2 10*3/uL (ref 1.7–7.7)
Neutrophils Relative %: 79 %
Platelets: 325 10*3/uL (ref 150–400)
RBC: 3.87 MIL/uL (ref 3.87–5.11)
RDW: 13.9 % (ref 11.5–15.5)
WBC: 9.1 10*3/uL (ref 4.0–10.5)
nRBC: 0 % (ref 0.0–0.2)

## 2020-01-16 LAB — URINALYSIS, ROUTINE W REFLEX MICROSCOPIC
Bilirubin Urine: NEGATIVE
Glucose, UA: 50 mg/dL — AB
Hgb urine dipstick: NEGATIVE
Ketones, ur: 5 mg/dL — AB
Leukocytes,Ua: NEGATIVE
Nitrite: NEGATIVE
Protein, ur: NEGATIVE mg/dL
Specific Gravity, Urine: 1.009 (ref 1.005–1.030)
pH: 7 (ref 5.0–8.0)

## 2020-01-16 LAB — BASIC METABOLIC PANEL
Anion gap: 16 — ABNORMAL HIGH (ref 5–15)
BUN: 12 mg/dL (ref 8–23)
CO2: 24 mmol/L (ref 22–32)
Calcium: 9.6 mg/dL (ref 8.9–10.3)
Chloride: 100 mmol/L (ref 98–111)
Creatinine, Ser: 0.89 mg/dL (ref 0.44–1.00)
GFR, Estimated: >60 mL/min (ref >60)
Glucose, Bld: 107 mg/dL — ABNORMAL HIGH (ref 70–99)
Potassium: 3.1 mmol/L — ABNORMAL LOW (ref 3.5–5.1)
Sodium: 140 mmol/L (ref 135–145)

## 2020-01-16 MED ORDER — TRAMADOL HCL 50 MG PO TABS
50.0000 mg | ORAL_TABLET | Freq: Once | ORAL | Status: AC
  Administered 2020-01-16: 50 mg via ORAL
  Filled 2020-01-16: qty 1

## 2020-01-16 MED ORDER — TRAMADOL HCL 50 MG PO TABS
50.0000 mg | ORAL_TABLET | Freq: Four times a day (QID) | ORAL | 0 refills | Status: DC | PRN
Start: 2020-01-16 — End: 2020-05-26

## 2020-01-16 MED ORDER — POTASSIUM CHLORIDE CRYS ER 20 MEQ PO TBCR
40.0000 meq | EXTENDED_RELEASE_TABLET | Freq: Once | ORAL | Status: AC
  Administered 2020-01-16: 40 meq via ORAL
  Filled 2020-01-16: qty 2

## 2020-01-16 MED ORDER — POTASSIUM CHLORIDE CRYS ER 20 MEQ PO TBCR: 20.0000 meq | EXTENDED_RELEASE_TABLET | Freq: Every day | ORAL | 0 refills | Status: DC

## 2020-01-16 NOTE — Discharge Instructions (Addendum)
Your potassium is low.  Take potassium pills daily for 3 days. Recheck potassium in a week.  Your x-rays did not show any fracture.  Follow-up with orthopedic doctor  Return to ER if you have another fall, severe hip pain or knee pain

## 2020-01-16 NOTE — ED Triage Notes (Signed)
Presents via EMS from assisted living s/p fall c/o left knee pain. Pt is poor historian d/t dementia. Report from EMS.

## 2020-01-16 NOTE — ED Provider Notes (Signed)
[facility] DEPT Provider Note   CSN: [id number] Arrival date & time: 01/16/20  1630     History Chief Complaint  Patient presents with  . Knee Pain    [name] is a 79 y.o. female here presenting with fall.  Patient is demented instrument assisted living.  Patient has history of diabetes and hypertension.  Patient states that she did not pass out and just fell onto the left leg.  She states that she may have hit her head as well.  Denies losing consciousness or chest pain.  Patient is not on blood thinners.  Patient apparently gets recurrent UTIs and recently was treated with antibiotics.  The history is provided by the patient and the EMS personnel.  Level V caveat- dementia      Past Medical History:  Diagnosis Date  . Allergic rhinitis   . Anxiety   . Arthritis   . Chronic cough    Dr Melvyn Novas, onset 11/2007, Sinus CT July 7,2010>neg, Resolved 10/2008 on ppi two times a day  . Colon polyp   . Depression   . Diabetes mellitus type II    diet  . Diverticulosis   . Dyslipidemia   . GERD (gastroesophageal reflux disease)   . Hiatal hernia   . Hypertension   . Hypothyroidism   . Lung nodule   . Obesity (BMI 30-39.9)   . Pneumonia   . Sleep apnea    Dr Gwenette Greet CPAP 2009    Patient Active Problem List   Diagnosis Date Noted  . Acromioclavicular arthrosis 12/30/2014  . Neck pain on right side 12/28/2014  . Hyperlipidemia 11/29/2014  . Uncomplicated type 2 diabetes mellitus ([place]) 11/29/2014  . GAD (generalized anxiety disorder) 08/19/2014  . Binge eating disorder 11/06/2013  . Hyperlipidemia with target LDL less than 100 06/25/2013  . GERD 12/03/2008  . Lung nodule 02/20/2008  . ANXIETY 10/27/2007  . OBSTRUCTIVE SLEEP APNEA 05/22/2007  . Type II diabetes mellitus with manifestations ([place]) 02/19/2007  . Allergic rhinitis 02/19/2007  . Hypothyroidism 12/21/2006  . Essential hypertension 12/21/2006    Past Surgical History:    Procedure Laterality Date  . ABDOMINAL HYSTERECTOMY    . APPENDECTOMY    . BLADDER SUSPENSION    . CHOLECYSTECTOMY     1998 in [place]  . EYE SURGERY  10/13   and 12/13; cataract surgery  . Left breast lumpectomy    . NISSEN FUNDOPLICATION     March '11 Dr Johney Maine  . RECTOCELE REPAIR    . TONSILLECTOMY     x 2  . VENTRAL HERNIA REPAIR       OB History   No obstetric history on file.     Family History  Problem Relation Age of Onset  . Stroke Mother 59  . Lung cancer Father     Social History   Tobacco Use  . Smoking status: Former Smoker    Packs/day: 1.50    Years: 25.00    Pack years: 37.50    Types: Cigarettes    Quit date: 10/28/1981    Years since quitting: 38.2  . Smokeless tobacco: Never Used  Substance Use Topics  . Alcohol use: No  . Drug use: No    Home Medications Prior to Admission medications   Medication Sig Start Date End Date Taking? Authorizing Provider  ALPRAZolam (XANAX) 0.25 MG tablet Take 1 tablet (0.25 mg total) by mouth 2 (two) times daily as needed. 02/21/15   Janith Lima,  MD  aspirin 81 MG tablet Take 81 mg by mouth daily.    [provider]  azelastine (ASTELIN) 0.1 % nasal spray Place 1 spray into both nostrils 2 (two) times daily. Use in each nostril as directed 06/24/14   Janith Lima, MD  azithromycin (ZITHROMAX) 500 MG tablet Take 1 tablet (500 mg total) by mouth daily. 03/10/15   Janith Lima, MD  Calcium Carbonate-Vitamin D (CALCIUM 600+D) 600-200 MG-UNIT TABS Take by mouth daily.      [provider]  carvedilol (COREG) 3.125 MG tablet Take 1 tablet (3.125 mg total) by mouth 2 (two) times daily with a meal. 09/16/14   Janith Lima, MD  Cinnamon 500 MG TABS Take by mouth daily.      [provider]  diltiazem (DILT-CD) 180 MG 24 hr capsule Take 1 capsule (180 mg total) by mouth every evening. 06/23/14 06/23/15  Janith Lima, MD  FLUoxetine (PROZAC) 20 MG capsule TAKE 1 CAPSULE AT BEDTIME 09/16/14    Janith Lima, MD  fluticasone [facility]) 50 MCG/ACT nasal spray Place 2 sprays into both nostrils daily. 06/24/14   Janith Lima, MD  Ginkgo Biloba 40 MG TABS Take by mouth daily.      [provider]  glucose blood (ONETOUCH VERIO) test strip Test 3 times daily 08/18/14   Janith Lima, MD  indapamide (LOZOL) 2.5 MG tablet Take 1 tablet by mouth  daily 01/24/15   Janith Lima, MD  levothyroxine (SYNTHROID, LEVOTHROID) 75 MCG tablet Take 1 tablet by mouth  daily 04/03/15   Janith Lima, MD  loratadine (CLARITIN) 10 MG tablet Take 1 tablet (10 mg total) by mouth daily. 05/29/14   Tonia Ghent, MD  losartan (COZAAR) 100 MG tablet Take 1 tablet by mouth  daily 04/03/15   Janith Lima, MD  Multiple Vitamins-Minerals (CENTRUM SILVER PO) Take by mouth daily.      [provider]  ONE TOUCH LANCETS MISC Use with One Touch Verio monitor as directed Twice Daily to test blood glucose levels. DX: 250.60 06/29/11   Janith Lima, MD  potassium chloride SA (K-DUR,KLOR-CON) 20 MEQ tablet Take 1 tablet (20 mEq total) by mouth daily. 09/16/14   Janith Lima, MD  prednisoLONE acetate (PRED FORTE) 1 % ophthalmic suspension PLACE 1 DROP INTO BOTH EYES 4 TIMES A DAY 09/01/14   [provider]  Probiotic Product (PROBIOTIC & ACIDOPHILUS EX ST PO) Take 1 tablet by mouth daily.    [provider]  promethazine-dextromethorphan (PROMETHAZINE-DM) 6.25-15 MG/5ML syrup Take 5 mLs by mouth 4 (four) times daily as needed for cough. 03/10/15   Janith Lima, MD  Red Yeast Rice 600 MG CAPS Take 1 capsule (600 mg total) by mouth daily. 06/10/12   Norins, Heinz Knuckles, MD  triamcinolone cream (KENALOG) 0.5 % Apply 1 application topically 3 (three) times daily. 05/11/14   Janith Lima, MD  UNABLE TO FIND ACCULAR 0.5%--RIGHT EYE    [provider]    Allergies    Amoxicillin, Codeine sulfate, Hydrochlorothiazide, Iodine, Iohexol, Paroxetine, Penicillins, and  Prednisone  Review of Systems   Review of Systems  Musculoskeletal:       L knee pain   All other systems reviewed and are negative.   Physical Exam Updated Vital Signs BP (!) 166/112   Pulse 78   Temp 98.6 F (37 C) (Oral)   Resp 17   SpO2 98%   Physical  Exam Vitals and nursing note reviewed.  Constitutional:      Comments: Demented   HENT:     Head: Normocephalic.     Comments: ? Posterior scalp tenderness, no obvious hematoma or laceration     Mouth/Throat:     Mouth: Mucous membranes are moist.  Eyes:     Extraocular Movements: Extraocular movements intact.     Pupils: Pupils are equal, round, and reactive to light.  Cardiovascular:     Rate and Rhythm: Normal rate and regular rhythm.     Pulses: Normal pulses.     Heart sounds: Normal heart sounds.  Pulmonary:     Effort: Pulmonary effort is normal.     Breath sounds: Normal breath sounds.  Abdominal:     General: Abdomen is flat.     Palpations: Abdomen is soft.  Musculoskeletal:     Cervical back: Normal range of motion and neck supple.     Comments: Decreased range of motion of the left hip.  Patient has mild diffuse femur tenderness with no obvious deformity.  Patient has decreased range of motion of the left knee but patella seems to be in place.  No tenderness over the patella tendon or quadriceps tendon.  Patient's ACL and PCL is grossly intact.  Skin:    General: Skin is warm.     Capillary Refill: Capillary refill takes less than 2 seconds.  Neurological:     General: No focal deficit present.     Mental Status: She is oriented to person, place, and time.  Psychiatric:        Mood and Affect: Mood normal.        Behavior: Behavior normal.     ED Results / Procedures / Treatments   Labs (all labs ordered are listed, but only abnormal results are displayed) Labs Reviewed  URINALYSIS, ROUTINE W REFLEX MICROSCOPIC - Abnormal; Notable for the following components:      Result Value   Glucose, UA  50 (*)    Ketones, ur 5 (*)    All other components within normal limits  CBC WITH DIFFERENTIAL/PLATELET - Abnormal; Notable for the following components:   Hemoglobin 11.9 (*)    All other components within normal limits  BASIC METABOLIC PANEL - Abnormal; Notable for the following components:   Potassium 3.1 (*)    Glucose, Bld 107 (*)    Anion gap 16 (*)    All other components within normal limits  URINE CULTURE    EKG EKG Interpretation  Date/Time:  Saturday January 16 2020 17:39:12 EDT Ventricular Rate:  78 PR Interval:    QRS Duration: 87 QT Interval:  420 QTC Calculation: 479 R Axis:   25 Text Interpretation: Sinus rhythm No significant change since last tracing Confirmed by Wandra Arthurs [phone]) on 01/16/2020 5:43:36 PM    ED ECG REPORT   Date: 01/16/2020  Rate: 78  Rhythm: normal sinus rhythm  QRS Axis: normal  Intervals: normal  ST/T Wave abnormalities: normal  Conduction Disutrbances:none  Narrative Interpretation:   Old EKG Reviewed: unchanged  I have personally reviewed the EKG tracing and agree with the computerized printout as noted.   Radiology DG Pelvis 1-2 Views  Result Date: 01/16/2020 CLINICAL DATA:  Multiple falls.  Left hip pain EXAM: PELVIS - 1-2 VIEW COMPARISON:  None. FINDINGS: There is no evidence of pelvic fracture or diastasis. No pelvic bone lesions are seen. IMPRESSION: Negative. Electronically Signed   By: Rolm Baptise M.D.  On: 01/16/2020 17:31   CT Head Wo Contrast  Result Date: 01/16/2020 CLINICAL DATA:  Fall, facial trauma EXAM: CT HEAD WITHOUT CONTRAST TECHNIQUE: Contiguous axial images were obtained from the base of the skull through the vertex without intravenous contrast. COMPARISON:  None. FINDINGS: Brain: There is atrophy and chronic small vessel disease changes. No acute intracranial abnormality. Specifically, no hemorrhage, hydrocephalus, mass lesion, acute infarction, or significant intracranial injury. Vascular: No  hyperdense vessel or unexpected calcification. Skull: No acute calvarial abnormality. Sinuses/Orbits: Visualized paranasal sinuses and mastoids clear. Orbital soft tissues unremarkable. Other: None IMPRESSION: Atrophy, chronic microvascular disease. No acute intracranial abnormality. Electronically Signed   By: Rolm Baptise M.D.   On: 01/16/2020 18:16   CT Cervical Spine Wo Contrast  Result Date: 01/16/2020 CLINICAL DATA:  Fall, neck trauma EXAM: CT CERVICAL SPINE WITHOUT CONTRAST TECHNIQUE: Multidetector CT imaging of the cervical spine was performed without intravenous contrast. Multiplanar CT image reconstructions were also generated. COMPARISON:  None. FINDINGS: Alignment: Negative Skull base and vertebrae: No acute fracture. No primary bone lesion or focal pathologic process. Soft tissues and spinal canal: No prevertebral fluid or swelling. No visible canal hematoma. Disc levels: Mild diffuse degenerative disc disease and facet disease. Upper chest: No acute findings Other: None IMPRESSION: No acute bony abnormality in the cervical spine. Electronically Signed   By: Rolm Baptise M.D.   On: 01/16/2020 18:18   DG Knee Complete 4 Views Left  Result Date: 01/16/2020 CLINICAL DATA:  Fall, left leg pain EXAM: LEFT KNEE - COMPLETE 4+ VIEW COMPARISON:  None. FINDINGS: No acute bony abnormality. Specifically, no fracture, subluxation, or dislocation. Joint spaces maintained. No joint effusion. Sclerotic area in the distal femur could reflect bone infarct or enchondroma. IMPRESSION: No acute bony abnormality. Electronically Signed   By: Rolm Baptise M.D.   On: 01/16/2020 17:29   DG Femur Min 2 Views Left  Result Date: 01/16/2020 CLINICAL DATA:  Multiple falls.  Left leg pain, hip pain EXAM: LEFT FEMUR 2 VIEWS COMPARISON:  12/16/2008 FINDINGS: No acute bony abnormality. Specifically, no fracture, subluxation, or dislocation. Sclerotic area in the distal femur could reflect bone infarct or enchondroma.  IMPRESSION: No acute bony abnormality. Electronically Signed   By: Rolm Baptise M.D.   On: 01/16/2020 17:31    Procedures Procedures (including critical care time)  Medications Ordered in ED Medications  potassium chloride SA (KLOR-CON) CR tablet 40 mEq (has no administration in time range)  traMADol (ULTRAM) tablet 50 mg (50 mg Oral Given 01/16/20 1832)    ED Course  I have reviewed the triage vital signs and the nursing notes.  Pertinent labs & imaging results that were available during my care of the patient were reviewed by me and considered in my medical decision making (see chart for details).    MDM Rules/Calculators/A&P                         [name] is a 79 y.o. female presenting with knee injury.  Patient has a questionable head injury as well.  Given that she is demented unable to tell me what happened and had 2 falls today, will get basic lab work and EKG and urinalysis and x-rays and CT head.   10:10 PM X-rays negative.  Her potassium is 3.1 and supplemented.  I think her weakness may be secondary to some dehydration and hypokalemia. Given some IV fluids and ate some food and felt better. Stable for discharge.  Patient will go home with daughter.  Final Clinical Impression(s) / ED Diagnoses Final diagnoses:  None    Rx / DC Orders ED Discharge Orders    None       Drenda Freeze, MD 01/16/20 2210

## 2020-01-18 LAB — URINE CULTURE: Culture: NO GROWTH

## 2020-01-19 ENCOUNTER — Other Ambulatory Visit: Payer: Self-pay

## 2020-01-19 ENCOUNTER — Encounter: Payer: Self-pay | Admitting: Internal Medicine

## 2020-01-19 ENCOUNTER — Ambulatory Visit (INDEPENDENT_AMBULATORY_CARE_PROVIDER_SITE_OTHER): Payer: Medicare HMO | Admitting: Internal Medicine

## 2020-01-19 VITALS — BP 132/84 | HR 76 | Temp 97.8°F | Ht 59.0 in | Wt 156.0 lb

## 2020-01-19 DIAGNOSIS — I10 Essential (primary) hypertension: Secondary | ICD-10-CM

## 2020-01-19 DIAGNOSIS — E038 Other specified hypothyroidism: Secondary | ICD-10-CM | POA: Diagnosis not present

## 2020-01-19 DIAGNOSIS — Z0000 Encounter for general adult medical examination without abnormal findings: Secondary | ICD-10-CM | POA: Diagnosis not present

## 2020-01-19 DIAGNOSIS — H6123 Impacted cerumen, bilateral: Secondary | ICD-10-CM

## 2020-01-19 DIAGNOSIS — N1832 Chronic kidney disease, stage 3b: Secondary | ICD-10-CM | POA: Diagnosis not present

## 2020-01-19 DIAGNOSIS — E785 Hyperlipidemia, unspecified: Secondary | ICD-10-CM | POA: Diagnosis not present

## 2020-01-19 DIAGNOSIS — E118 Type 2 diabetes mellitus with unspecified complications: Secondary | ICD-10-CM | POA: Diagnosis not present

## 2020-01-19 DIAGNOSIS — E876 Hypokalemia: Secondary | ICD-10-CM

## 2020-01-19 DIAGNOSIS — Z23 Encounter for immunization: Secondary | ICD-10-CM

## 2020-01-19 LAB — BASIC METABOLIC PANEL
BUN: 19 mg/dL (ref 6–23)
CO2: 24 mEq/L (ref 19–32)
Calcium: 9.8 mg/dL (ref 8.4–10.5)
Chloride: 97 mEq/L (ref 96–112)
Creatinine, Ser: 1.05 mg/dL (ref 0.40–1.20)
GFR: 50.14 mL/min — ABNORMAL LOW (ref >60.00)
Glucose, Bld: 101 mg/dL — ABNORMAL HIGH (ref 70–99)
Potassium: 3.6 mEq/L (ref 3.5–5.1)
Sodium: 135 mEq/L (ref 135–145)

## 2020-01-19 LAB — LIPID PANEL
Cholesterol: 166 mg/dL (ref 0–200)
HDL: 64.8 mg/dL (ref >39.00)
LDL Cholesterol: 79 mg/dL (ref 0–99)
NonHDL: 101.55
Total CHOL/HDL Ratio: 3
Triglycerides: 114 mg/dL (ref 0.0–149.0)
VLDL: 22.8 mg/dL (ref 0.0–40.0)

## 2020-01-19 LAB — HEMOGLOBIN A1C: Hgb A1c MFr Bld: 6.2 % (ref 4.6–6.5)

## 2020-01-19 LAB — MAGNESIUM: Magnesium: 1.9 mg/dL (ref 1.5–2.5)

## 2020-01-19 LAB — TSH: TSH: 4.3 u[IU]/mL (ref 0.35–4.50)

## 2020-01-19 NOTE — Patient Instructions (Signed)
Health Maintenance, Female Adopting a healthy lifestyle and getting preventive care are important in promoting health and wellness. Ask your health care provider about:  The right schedule for you to have regular tests and exams.  Things you can do on your own to prevent diseases and keep yourself healthy. What should I know about diet, weight, and exercise? Eat a healthy diet   Eat a diet that includes plenty of vegetables, fruits, low-fat dairy products, and lean protein.  Do not eat a lot of foods that are high in solid fats, added sugars, or sodium. Maintain a healthy weight Body mass index (BMI) is used to identify weight problems. It estimates body fat based on height and weight. Your health care provider can help determine your BMI and help you achieve or maintain a healthy weight. Get regular exercise Get regular exercise. This is one of the most important things you can do for your health. Most adults should:  Exercise for at least 150 minutes each week. The exercise should increase your heart rate and make you sweat (moderate-intensity exercise).  Do strengthening exercises at least twice a week. This is in addition to the moderate-intensity exercise.  Spend less time sitting. Even light physical activity can be beneficial. Watch cholesterol and blood lipids Have your blood tested for lipids and cholesterol at 80 years of age, then have this test every 5 years. Have your cholesterol levels checked more often if:  Your lipid or cholesterol levels are high.  You are older than 80 years of age.  You are at high risk for heart disease. What should I know about cancer screening? Depending on your health history and family history, you may need to have cancer screening at various ages. This may include screening for:  Breast cancer.  Cervical cancer.  Colorectal cancer.  Skin cancer.  Lung cancer. What should I know about heart disease, diabetes, and high blood  pressure? Blood pressure and heart disease  High blood pressure causes heart disease and increases the risk of stroke. This is more likely to develop in people who have high blood pressure readings, are of African descent, or are overweight.  Have your blood pressure checked: ? Every 3-5 years if you are 18-39 years of age. ? Every year if you are 40 years old or older. Diabetes Have regular diabetes screenings. This checks your fasting blood sugar level. Have the screening done:  Once every three years after age 40 if you are at a normal weight and have a low risk for diabetes.  More often and at a younger age if you are overweight or have a high risk for diabetes. What should I know about preventing infection? Hepatitis B If you have a higher risk for hepatitis B, you should be screened for this virus. Talk with your health care provider to find out if you are at risk for hepatitis B infection. Hepatitis C Testing is recommended for:  Everyone born from 1945 through 1965.  Anyone with known risk factors for hepatitis C. Sexually transmitted infections (STIs)  Get screened for STIs, including gonorrhea and chlamydia, if: ? You are sexually active and are younger than 80 years of age. ? You are older than 80 years of age and your health care provider tells you that you are at risk for this type of infection. ? Your sexual activity has changed since you were last screened, and you are at increased risk for chlamydia or gonorrhea. Ask your health care provider if   you are at risk.  Ask your health care provider about whether you are at high risk for HIV. Your health care provider may recommend a prescription medicine to help prevent HIV infection. If you choose to take medicine to prevent HIV, you should first get tested for HIV. You should then be tested every 3 months for as long as you are taking the medicine. Pregnancy  If you are about to stop having your period (premenopausal) and  you may become pregnant, seek counseling before you get pregnant.  Take 400 to 800 micrograms (mcg) of folic acid every day if you become pregnant.  Ask for birth control (contraception) if you want to prevent pregnancy. Osteoporosis and menopause Osteoporosis is a disease in which the bones lose minerals and strength with aging. This can result in bone fractures. If you are 65 years old or older, or if you are at risk for osteoporosis and fractures, ask your health care provider if you should:  Be screened for bone loss.  Take a calcium or vitamin D supplement to lower your risk of fractures.  Be given hormone replacement therapy (HRT) to treat symptoms of menopause. Follow these instructions at home: Lifestyle  Do not use any products that contain nicotine or tobacco, such as cigarettes, e-cigarettes, and chewing tobacco. If you need help quitting, ask your health care provider.  Do not use street drugs.  Do not share needles.  Ask your health care provider for help if you need support or information about quitting drugs. Alcohol use  Do not drink alcohol if: ? Your health care provider tells you not to drink. ? You are pregnant, may be pregnant, or are planning to become pregnant.  If you drink alcohol: ? Limit how much you use to 0-1 drink a day. ? Limit intake if you are breastfeeding.  Be aware of how much alcohol is in your drink. In the [place] one drink equals one 12 oz bottle of beer (355 mL), one 5 oz glass of wine (148 mL), or one 1 oz glass of hard liquor (44 mL). General instructions  Schedule regular health, dental, and eye exams.  Stay current with your vaccines.  Tell your health care provider if: ? You often feel depressed. ? You have ever been abused or do not feel safe at home. Summary  Adopting a healthy lifestyle and getting preventive care are important in promoting health and wellness.  Follow your health care provider's instructions about healthy  diet, exercising, and getting tested or screened for diseases.  Follow your health care provider's instructions on monitoring your cholesterol and blood pressure. This information is not intended to replace advice given to you by your health care provider. Make sure you discuss any questions you have with your health care provider. Document Revised: 02/26/2018 Document Reviewed: 02/26/2018 Elsevier Patient Education  2020 [employer]  

## 2020-01-19 NOTE — Progress Notes (Signed)
Patient consent obtained. Irrigation with water and peroxide performed. Full view of tympanic membranes after procedure.  Patient tolerated procedure well.   

## 2020-01-19 NOTE — Progress Notes (Addendum)
Subjective:  Patient ID: [name], female    DOB: Mar 05, 1941  Age: 79 y.o. MRN: [id number]  CC: Annual Exam, Hyperlipidemia, and Diabetes  This visit occurred during the SARS-CoV-2 public health emergency.  Safety protocols were in place, including screening questions prior to the visit, additional usage of staff PPE, and extensive cleaning of exam room while observing appropriate contact time as indicated for disinfecting solutions.    HPI Nile Dorning presents for a CPX.  She moved to [place] a few years ago but has since moved back to [place] to do some rehab.  She tells me that over the last few years she has developed ataxia with frequent falls.  She tells me that she is improving with therapy.  She has a history of DM2.  She tells me she has been working on her lifestyle modifications and has been able to lose weight.  She is taking a thyroid supplement.  She feels like her dose is adequate.  She denies constipation, edema, weight gain, or edema.  She is with her cousin today who lives locally.  She complains that over the last few weeks she has had trouble with her hearing.  She was recently seen in the emergency department for nontraumatic knee pain and was found to have a low potassium level.   Outpatient Medications Prior to Visit  Medication Sig Dispense Refill  . ALPRAZolam (XANAX) 0.25 MG tablet Take 1 tablet (0.25 mg total) by mouth 2 (two) times daily as needed. 30 tablet 2  . aspirin 81 MG tablet Take 81 mg by mouth daily.    Marland Kitchen azelastine (ASTELIN) 0.1 % nasal spray Place 1 spray into both nostrils 2 (two) times daily. Use in each nostril as directed 30 mL 11  . azithromycin (ZITHROMAX) 500 MG tablet Take 1 tablet (500 mg total) by mouth daily. 3 tablet 0  . Calcium Carbonate-Vitamin D (CALCIUM 600+D) 600-200 MG-UNIT TABS Take by mouth daily.      . carvedilol (COREG) 3.125 MG tablet Take 1 tablet (3.125 mg total) by mouth 2 (two) times daily with a meal. 180 tablet  3  . Cinnamon 500 MG TABS Take by mouth daily.      Marland Kitchen FLUoxetine (PROZAC) 20 MG capsule TAKE 1 CAPSULE AT BEDTIME 90 capsule 3  . fluticasone (FLONASE) 50 MCG/ACT nasal spray Place 2 sprays into both nostrils daily. 16 g 11  . Ginkgo Biloba 40 MG TABS Take by mouth daily.      Marland Kitchen glucose blood (ONETOUCH VERIO) test strip Test 3 times daily 100 each 12  . indapamide (LOZOL) 2.5 MG tablet Take 1 tablet by mouth  daily 90 tablet 3  . levothyroxine (SYNTHROID, LEVOTHROID) 75 MCG tablet Take 1 tablet by mouth  daily 90 tablet 1  . loratadine (CLARITIN) 10 MG tablet Take 1 tablet (10 mg total) by mouth daily.    Marland Kitchen losartan (COZAAR) 100 MG tablet Take 1 tablet by mouth  daily 90 tablet 1  . Multiple Vitamins-Minerals (CENTRUM SILVER PO) Take by mouth daily.      . ONE TOUCH LANCETS MISC Use with One Touch Verio monitor as directed Twice Daily to test blood glucose levels. DX: 250.60 200 each 5  . potassium chloride SA (KLOR-CON) 20 MEQ tablet Take 1 tablet (20 mEq total) by mouth daily. 3 tablet 0  . prednisoLONE acetate (PRED FORTE) 1 % ophthalmic suspension PLACE 1 DROP INTO BOTH EYES 4 TIMES A DAY  0  . Probiotic Product (  PROBIOTIC & ACIDOPHILUS EX ST PO) Take 1 tablet by mouth daily.    . promethazine-dextromethorphan (PROMETHAZINE-DM) 6.25-15 MG/5ML syrup Take 5 mLs by mouth 4 (four) times daily as needed for cough. 118 mL 0  . Red Yeast Rice 600 MG CAPS Take 1 capsule (600 mg total) by mouth daily.    . traMADol (ULTRAM) 50 MG tablet Take 1 tablet (50 mg total) by mouth every 6 (six) hours as needed. 10 tablet 0  . triamcinolone cream (KENALOG) 0.5 % Apply 1 application topically 3 (three) times daily. 30 g 1  . UNABLE TO FIND ACCULAR 0.5%--RIGHT EYE    . diltiazem (DILT-CD) 180 MG 24 hr capsule Take 1 capsule (180 mg total) by mouth every evening. 90 capsule 3   No facility-administered medications prior to visit.    ROS Review of Systems  Constitutional: Negative.  Negative for  diaphoresis, fatigue and unexpected weight change.  HENT: Positive for hearing loss. Negative for ear pain, sinus pressure, trouble swallowing and voice change.   Eyes: Negative.   Respiratory: Negative for cough, chest tightness, shortness of breath and wheezing.   Cardiovascular: Negative for chest pain, palpitations and leg swelling.  Gastrointestinal: Negative for abdominal pain, constipation, diarrhea, nausea and vomiting.  Endocrine: Negative.  Negative for cold intolerance.  Genitourinary: Negative.  Negative for difficulty urinating.  Musculoskeletal: Positive for gait problem. Negative for arthralgias, myalgias and neck pain.  Skin: Negative for color change, pallor and rash.  Neurological: Negative for headaches.  Hematological: Negative for adenopathy. Does not bruise/bleed easily.  Psychiatric/Behavioral: Negative.     Objective:  BP 132/84   Pulse 76   Temp 97.8 F (36.6 C) (Oral)   Ht 4\' 11"  (1.499 m)   Wt 156 lb (70.8 kg)   SpO2 98%   BMI 31.51 kg/m   BP Readings from Last 3 Encounters:  01/19/20 132/84  01/16/20 (!) 157/84  03/08/15 110/70    Wt Readings from Last 3 Encounters:  01/19/20 156 lb (70.8 kg)  03/08/15 181 lb (82.1 kg)  12/28/14 182 lb (82.6 kg)    Physical Exam Vitals reviewed.  Constitutional:      Appearance: Normal appearance.  HENT:     Right Ear: Tympanic membrane and external ear normal. Decreased hearing noted. There is impacted cerumen. Tympanic membrane is not injected.     Left Ear: Tympanic membrane and external ear normal. Decreased hearing noted. There is impacted cerumen. Tympanic membrane is not injected.     Ears:     Comments: I put Colace in both ears and then irrigated them using water and an ear pick.  Her hearing has returned to normal.  She tolerated this well.  The examination afterwards shows that the [facility] and TMs are normal.    Nose: Nose normal.     Mouth/Throat:     Mouth: Mucous membranes are moist.  Eyes:      General: No scleral icterus.    Conjunctiva/sclera: Conjunctivae normal.  Cardiovascular:     Rate and Rhythm: Normal rate and regular rhythm.     Heart sounds: No murmur heard.   Pulmonary:     Effort: Pulmonary effort is normal.     Breath sounds: No stridor. No wheezing, rhonchi or rales.  Abdominal:     General: Abdomen is protuberant. Bowel sounds are normal. There is no distension.     Palpations: Abdomen is soft. There is no hepatomegaly, splenomegaly or mass.     Tenderness: There is no  abdominal tenderness.  Musculoskeletal:        General: No swelling. Normal range of motion.     Cervical back: Neck supple.     Right lower leg: No edema.     Left lower leg: No edema.  Lymphadenopathy:     Cervical: No cervical adenopathy.  Skin:    General: Skin is warm and dry.     Coloration: Skin is not pale.  Neurological:     General: No focal deficit present.     Mental Status: She is alert and oriented to person, place, and time. Mental status is at baseline.  Psychiatric:        Mood and Affect: Mood normal.        Behavior: Behavior normal.     Lab Results  Component Value Date   WBC 9.1 01/16/2020   HGB 11.9 (L) 01/16/2020   HCT 36.2 01/16/2020   PLT 325 01/16/2020   GLUCOSE 101 (H) 01/19/2020   CHOL 166 01/19/2020   TRIG 114.0 01/19/2020   HDL 64.80 01/19/2020   LDLDIRECT 120.6 11/24/2012   LDLCALC 79 01/19/2020   ALT 18 03/08/2015   AST 19 03/08/2015   NA 135 01/19/2020   K 3.6 01/19/2020   CL 97 01/19/2020   CREATININE 1.05 01/19/2020   BUN 19 01/19/2020   CO2 24 01/19/2020   TSH 4.30 01/19/2020   INR 1.0 10/01/2011   HGBA1C 6.2 01/19/2020   MICROALBUR 0.8 06/23/2014    DG Pelvis 1-2 Views  Result Date: 01/16/2020 CLINICAL DATA:  Multiple falls.  Left hip pain EXAM: PELVIS - 1-2 VIEW COMPARISON:  None. FINDINGS: There is no evidence of pelvic fracture or diastasis. No pelvic bone lesions are seen. IMPRESSION: Negative. Electronically Signed   By:  Rolm Baptise M.D.   On: 01/16/2020 17:31   CT Head Wo Contrast  Result Date: 01/16/2020 CLINICAL DATA:  Fall, facial trauma EXAM: CT HEAD WITHOUT CONTRAST TECHNIQUE: Contiguous axial images were obtained from the base of the skull through the vertex without intravenous contrast. COMPARISON:  None. FINDINGS: Brain: There is atrophy and chronic small vessel disease changes. No acute intracranial abnormality. Specifically, no hemorrhage, hydrocephalus, mass lesion, acute infarction, or significant intracranial injury. Vascular: No hyperdense vessel or unexpected calcification. Skull: No acute calvarial abnormality. Sinuses/Orbits: Visualized paranasal sinuses and mastoids clear. Orbital soft tissues unremarkable. Other: None IMPRESSION: Atrophy, chronic microvascular disease. No acute intracranial abnormality. Electronically Signed   By: Rolm Baptise M.D.   On: 01/16/2020 18:16   CT Cervical Spine Wo Contrast  Result Date: 01/16/2020 CLINICAL DATA:  Fall, neck trauma EXAM: CT CERVICAL SPINE WITHOUT CONTRAST TECHNIQUE: Multidetector CT imaging of the cervical spine was performed without intravenous contrast. Multiplanar CT image reconstructions were also generated. COMPARISON:  None. FINDINGS: Alignment: Negative Skull base and vertebrae: No acute fracture. No primary bone lesion or focal pathologic process. Soft tissues and spinal canal: No prevertebral fluid or swelling. No visible canal hematoma. Disc levels: Mild diffuse degenerative disc disease and facet disease. Upper chest: No acute findings Other: None IMPRESSION: No acute bony abnormality in the cervical spine. Electronically Signed   By: Rolm Baptise M.D.   On: 01/16/2020 18:18   DG Knee Complete 4 Views Left  Result Date: 01/16/2020 CLINICAL DATA:  Fall, left leg pain EXAM: LEFT KNEE - COMPLETE 4+ VIEW COMPARISON:  None. FINDINGS: No acute bony abnormality. Specifically, no fracture, subluxation, or dislocation. Joint spaces maintained. No  joint effusion. Sclerotic area in the distal  femur could reflect bone infarct or enchondroma. IMPRESSION: No acute bony abnormality. Electronically Signed   By: Rolm Baptise M.D.   On: 01/16/2020 17:29   DG Femur Min 2 Views Left  Result Date: 01/16/2020 CLINICAL DATA:  Multiple falls.  Left leg pain, hip pain EXAM: LEFT FEMUR 2 VIEWS COMPARISON:  12/16/2008 FINDINGS: No acute bony abnormality. Specifically, no fracture, subluxation, or dislocation. Sclerotic area in the distal femur could reflect bone infarct or enchondroma. IMPRESSION: No acute bony abnormality. Electronically Signed   By: Rolm Baptise M.D.   On: 01/16/2020 17:31    Assessment & Plan:   Idolina was seen today for annual exam, hyperlipidemia and diabetes.  Diagnoses and all orders for this visit:  Flu vaccine need -     Flu Vaccine QUAD High Dose(Fluad)  Essential hypertension- Her blood pressure is adequately well controlled.  Electrolytes and renal function are normal. -     Magnesium; Future -     Basic metabolic panel; Future -     Basic metabolic panel -     Magnesium  Type II diabetes mellitus with manifestations ([place])- Her A1c is at 6.2%.  Her blood sugar is adequately well controlled.  Medical therapy is not indicated. -     Hemoglobin A1c; Future -     Hemoglobin A1c  Other specified hypothyroidism- Her TSH is in the normal range.  She will remain on the current dose of levothyroxine. -     TSH; Future -     TSH  Hyperlipidemia with target LDL less than 100- Statin therapy is not indicated. -     Lipid panel; Future -     Lipid panel  Hypokalemia- Her potassium level is normal now. -     Magnesium; Future -     Basic metabolic panel; Future -     Basic metabolic panel -     Magnesium  Stage 3b chronic kidney disease ([place])- Her blood pressure and blood sugar are adequately well controlled.  She will avoid nephrotoxic agents.   I am having Sabine Heilman maintain her Calcium Carbonate-Vitamin D,  Multiple Vitamins-Minerals (CENTRUM SILVER PO), Cinnamon, Ginkgo Biloba, ONE TOUCH LANCETS, aspirin, Probiotic Product (PROBIOTIC & ACIDOPHILUS EX ST PO), Red Yeast Rice, UNABLE TO FIND, triamcinolone cream, loratadine, diltiazem, fluticasone, azelastine, glucose blood, carvedilol, FLUoxetine, prednisoLONE acetate, indapamide, ALPRAZolam, azithromycin, promethazine-dextromethorphan, levothyroxine, losartan, potassium chloride SA, and traMADol.  No orders of the defined types were placed in this encounter.  In addition to time spent on CPE, I spent 50 minutes in preparing to see the patient by review of recent labs, imaging and procedures, obtaining and reviewing separately obtained history, communicating with the patient and family or caregiver, ordering medications, tests or procedures, and documenting clinical information in the EHR including the differential Dx, treatment, and any further evaluation and other management of 1. Essential hypertension 2. Type II diabetes mellitus with manifestations ([place]) 3. Other specified hypothyroidism 4. Hyperlipidemia with target LDL less than 100 5. Hypokalemia 6. Stage 3b chronic kidney disease ([place])        Follow-up: Return in about 4 months (around 05/18/2020).  Scarlette Calico, MD

## 2020-01-20 DIAGNOSIS — E876 Hypokalemia: Secondary | ICD-10-CM | POA: Insufficient documentation

## 2020-01-20 DIAGNOSIS — N1832 Chronic kidney disease, stage 3b: Secondary | ICD-10-CM | POA: Insufficient documentation

## 2020-01-22 DIAGNOSIS — Z0000 Encounter for general adult medical examination without abnormal findings: Secondary | ICD-10-CM | POA: Insufficient documentation

## 2020-01-22 NOTE — Assessment & Plan Note (Signed)
Exam completed Labs reviewed Vaccines reviewed and updated No cancer screenings are indicated Patient education was given 

## 2020-01-25 DIAGNOSIS — R2681 Unsteadiness on feet: Secondary | ICD-10-CM | POA: Diagnosis not present

## 2020-01-25 DIAGNOSIS — R296 Repeated falls: Secondary | ICD-10-CM | POA: Diagnosis not present

## 2020-01-25 DIAGNOSIS — M6281 Muscle weakness (generalized): Secondary | ICD-10-CM | POA: Diagnosis not present

## 2020-01-26 ENCOUNTER — Encounter: Payer: Self-pay | Admitting: Internal Medicine

## 2020-01-26 DIAGNOSIS — R488 Other symbolic dysfunctions: Secondary | ICD-10-CM | POA: Diagnosis not present

## 2020-01-26 DIAGNOSIS — R41841 Cognitive communication deficit: Secondary | ICD-10-CM | POA: Diagnosis not present

## 2020-01-26 DIAGNOSIS — H6123 Impacted cerumen, bilateral: Secondary | ICD-10-CM | POA: Insufficient documentation

## 2020-01-29 ENCOUNTER — Telehealth: Payer: Self-pay | Admitting: Internal Medicine

## 2020-01-29 NOTE — Telephone Encounter (Signed)
Please call to discuss lab results 

## 2020-01-29 NOTE — Telephone Encounter (Signed)
Pt has been informed of results and expressed understanding.  °

## 2020-02-01 ENCOUNTER — Telehealth: Payer: Self-pay | Admitting: Internal Medicine

## 2020-02-01 DIAGNOSIS — R296 Repeated falls: Secondary | ICD-10-CM | POA: Diagnosis not present

## 2020-02-01 DIAGNOSIS — R41841 Cognitive communication deficit: Secondary | ICD-10-CM | POA: Diagnosis not present

## 2020-02-01 DIAGNOSIS — R2681 Unsteadiness on feet: Secondary | ICD-10-CM | POA: Diagnosis not present

## 2020-02-01 DIAGNOSIS — R488 Other symbolic dysfunctions: Secondary | ICD-10-CM | POA: Diagnosis not present

## 2020-02-01 DIAGNOSIS — M6281 Muscle weakness (generalized): Secondary | ICD-10-CM | POA: Diagnosis not present

## 2020-02-01 NOTE — Telephone Encounter (Signed)
Rosalyn calling to state the patient was seen in a hospital in [place] and they gave her a psychiatric evaluation and when she left the hospital they gave her all of her medical records except her psychiatric evaluation and they need that because she is unable to take care of herself and these documents are the ones who have stated that and she was wondering if we could request those records.  [place] # [phone]

## 2020-02-03 DIAGNOSIS — R2681 Unsteadiness on feet: Secondary | ICD-10-CM | POA: Diagnosis not present

## 2020-02-03 DIAGNOSIS — M6281 Muscle weakness (generalized): Secondary | ICD-10-CM | POA: Diagnosis not present

## 2020-02-03 DIAGNOSIS — R296 Repeated falls: Secondary | ICD-10-CM | POA: Diagnosis not present

## 2020-02-03 DIAGNOSIS — R41841 Cognitive communication deficit: Secondary | ICD-10-CM | POA: Diagnosis not present

## 2020-02-03 DIAGNOSIS — R488 Other symbolic dysfunctions: Secondary | ICD-10-CM | POA: Diagnosis not present

## 2020-02-03 NOTE — Telephone Encounter (Signed)
LVM [name]  She is not listed on the DPR  Just some general information that can be given to her. Pt would need to sign a records release in order for [place] to request psych eval or she would have to call and request it herself.

## 2020-02-08 DIAGNOSIS — R41841 Cognitive communication deficit: Secondary | ICD-10-CM | POA: Diagnosis not present

## 2020-02-08 DIAGNOSIS — R296 Repeated falls: Secondary | ICD-10-CM | POA: Diagnosis not present

## 2020-02-08 DIAGNOSIS — R488 Other symbolic dysfunctions: Secondary | ICD-10-CM | POA: Diagnosis not present

## 2020-02-08 DIAGNOSIS — M6281 Muscle weakness (generalized): Secondary | ICD-10-CM | POA: Diagnosis not present

## 2020-02-08 DIAGNOSIS — R2681 Unsteadiness on feet: Secondary | ICD-10-CM | POA: Diagnosis not present

## 2020-02-08 DIAGNOSIS — G4733 Obstructive sleep apnea (adult) (pediatric): Secondary | ICD-10-CM | POA: Diagnosis not present

## 2020-02-08 NOTE — Telephone Encounter (Signed)
Rosalyn calling back, states when patient was here last they signed a new DPR so she could get medical information on the patient. Informed her that Miria would need to call us and request it or come sign a records release.

## 2020-02-10 DIAGNOSIS — R296 Repeated falls: Secondary | ICD-10-CM | POA: Diagnosis not present

## 2020-02-10 DIAGNOSIS — M6281 Muscle weakness (generalized): Secondary | ICD-10-CM | POA: Diagnosis not present

## 2020-02-10 DIAGNOSIS — R41841 Cognitive communication deficit: Secondary | ICD-10-CM | POA: Diagnosis not present

## 2020-02-10 DIAGNOSIS — R488 Other symbolic dysfunctions: Secondary | ICD-10-CM | POA: Diagnosis not present

## 2020-02-12 DIAGNOSIS — R2681 Unsteadiness on feet: Secondary | ICD-10-CM | POA: Diagnosis not present

## 2020-02-12 DIAGNOSIS — M6281 Muscle weakness (generalized): Secondary | ICD-10-CM | POA: Diagnosis not present

## 2020-02-15 DIAGNOSIS — R41841 Cognitive communication deficit: Secondary | ICD-10-CM | POA: Diagnosis not present

## 2020-02-15 DIAGNOSIS — R2681 Unsteadiness on feet: Secondary | ICD-10-CM | POA: Diagnosis not present

## 2020-02-15 DIAGNOSIS — R296 Repeated falls: Secondary | ICD-10-CM | POA: Diagnosis not present

## 2020-02-15 DIAGNOSIS — R488 Other symbolic dysfunctions: Secondary | ICD-10-CM | POA: Diagnosis not present

## 2020-02-15 DIAGNOSIS — M6281 Muscle weakness (generalized): Secondary | ICD-10-CM | POA: Diagnosis not present

## 2020-02-17 ENCOUNTER — Ambulatory Visit: Payer: Medicare HMO

## 2020-02-17 DIAGNOSIS — R41841 Cognitive communication deficit: Secondary | ICD-10-CM | POA: Diagnosis not present

## 2020-02-17 DIAGNOSIS — R2681 Unsteadiness on feet: Secondary | ICD-10-CM | POA: Diagnosis not present

## 2020-02-17 DIAGNOSIS — M6281 Muscle weakness (generalized): Secondary | ICD-10-CM | POA: Diagnosis not present

## 2020-02-17 DIAGNOSIS — R296 Repeated falls: Secondary | ICD-10-CM | POA: Diagnosis not present

## 2020-02-17 DIAGNOSIS — R488 Other symbolic dysfunctions: Secondary | ICD-10-CM | POA: Diagnosis not present

## 2020-02-29 DIAGNOSIS — M1909 Primary osteoarthritis, other specified site: Secondary | ICD-10-CM | POA: Diagnosis not present

## 2020-02-29 DIAGNOSIS — I10 Essential (primary) hypertension: Secondary | ICD-10-CM | POA: Diagnosis not present

## 2020-02-29 DIAGNOSIS — E876 Hypokalemia: Secondary | ICD-10-CM | POA: Diagnosis not present

## 2020-02-29 DIAGNOSIS — E039 Hypothyroidism, unspecified: Secondary | ICD-10-CM | POA: Diagnosis not present

## 2020-02-29 DIAGNOSIS — E119 Type 2 diabetes mellitus without complications: Secondary | ICD-10-CM | POA: Diagnosis not present

## 2020-03-03 DIAGNOSIS — E782 Mixed hyperlipidemia: Secondary | ICD-10-CM | POA: Diagnosis not present

## 2020-03-03 DIAGNOSIS — I10 Essential (primary) hypertension: Secondary | ICD-10-CM | POA: Diagnosis not present

## 2020-03-03 DIAGNOSIS — E039 Hypothyroidism, unspecified: Secondary | ICD-10-CM | POA: Diagnosis not present

## 2020-03-03 DIAGNOSIS — E119 Type 2 diabetes mellitus without complications: Secondary | ICD-10-CM | POA: Diagnosis not present

## 2020-03-04 DIAGNOSIS — F411 Generalized anxiety disorder: Secondary | ICD-10-CM | POA: Diagnosis not present

## 2020-03-04 DIAGNOSIS — F331 Major depressive disorder, recurrent, moderate: Secondary | ICD-10-CM | POA: Diagnosis not present

## 2020-03-07 DIAGNOSIS — K219 Gastro-esophageal reflux disease without esophagitis: Secondary | ICD-10-CM | POA: Diagnosis not present

## 2020-03-07 DIAGNOSIS — F419 Anxiety disorder, unspecified: Secondary | ICD-10-CM | POA: Diagnosis not present

## 2020-03-07 DIAGNOSIS — I129 Hypertensive chronic kidney disease with stage 1 through stage 4 chronic kidney disease, or unspecified chronic kidney disease: Secondary | ICD-10-CM | POA: Diagnosis not present

## 2020-03-07 DIAGNOSIS — G3184 Mild cognitive impairment, so stated: Secondary | ICD-10-CM | POA: Diagnosis not present

## 2020-03-07 DIAGNOSIS — Z9181 History of falling: Secondary | ICD-10-CM | POA: Diagnosis not present

## 2020-03-07 DIAGNOSIS — M159 Polyosteoarthritis, unspecified: Secondary | ICD-10-CM | POA: Diagnosis not present

## 2020-03-07 DIAGNOSIS — E039 Hypothyroidism, unspecified: Secondary | ICD-10-CM | POA: Diagnosis not present

## 2020-03-07 DIAGNOSIS — N183 Chronic kidney disease, stage 3 unspecified: Secondary | ICD-10-CM | POA: Diagnosis not present

## 2020-03-07 DIAGNOSIS — E1122 Type 2 diabetes mellitus with diabetic chronic kidney disease: Secondary | ICD-10-CM | POA: Diagnosis not present

## 2020-03-09 DIAGNOSIS — M159 Polyosteoarthritis, unspecified: Secondary | ICD-10-CM | POA: Diagnosis not present

## 2020-03-09 DIAGNOSIS — K219 Gastro-esophageal reflux disease without esophagitis: Secondary | ICD-10-CM | POA: Diagnosis not present

## 2020-03-09 DIAGNOSIS — N183 Chronic kidney disease, stage 3 unspecified: Secondary | ICD-10-CM | POA: Diagnosis not present

## 2020-03-09 DIAGNOSIS — E1122 Type 2 diabetes mellitus with diabetic chronic kidney disease: Secondary | ICD-10-CM | POA: Diagnosis not present

## 2020-03-09 DIAGNOSIS — E039 Hypothyroidism, unspecified: Secondary | ICD-10-CM | POA: Diagnosis not present

## 2020-03-09 DIAGNOSIS — G4733 Obstructive sleep apnea (adult) (pediatric): Secondary | ICD-10-CM | POA: Diagnosis not present

## 2020-03-09 DIAGNOSIS — G3184 Mild cognitive impairment, so stated: Secondary | ICD-10-CM | POA: Diagnosis not present

## 2020-03-09 DIAGNOSIS — M6281 Muscle weakness (generalized): Secondary | ICD-10-CM | POA: Diagnosis not present

## 2020-03-09 DIAGNOSIS — F419 Anxiety disorder, unspecified: Secondary | ICD-10-CM | POA: Diagnosis not present

## 2020-03-09 DIAGNOSIS — Z9181 History of falling: Secondary | ICD-10-CM | POA: Diagnosis not present

## 2020-03-09 DIAGNOSIS — I129 Hypertensive chronic kidney disease with stage 1 through stage 4 chronic kidney disease, or unspecified chronic kidney disease: Secondary | ICD-10-CM | POA: Diagnosis not present

## 2020-03-15 DIAGNOSIS — Z9181 History of falling: Secondary | ICD-10-CM | POA: Diagnosis not present

## 2020-03-15 DIAGNOSIS — N183 Chronic kidney disease, stage 3 unspecified: Secondary | ICD-10-CM | POA: Diagnosis not present

## 2020-03-15 DIAGNOSIS — E039 Hypothyroidism, unspecified: Secondary | ICD-10-CM | POA: Diagnosis not present

## 2020-03-15 DIAGNOSIS — E1122 Type 2 diabetes mellitus with diabetic chronic kidney disease: Secondary | ICD-10-CM | POA: Diagnosis not present

## 2020-03-15 DIAGNOSIS — G3184 Mild cognitive impairment, so stated: Secondary | ICD-10-CM | POA: Diagnosis not present

## 2020-03-15 DIAGNOSIS — K219 Gastro-esophageal reflux disease without esophagitis: Secondary | ICD-10-CM | POA: Diagnosis not present

## 2020-03-15 DIAGNOSIS — M159 Polyosteoarthritis, unspecified: Secondary | ICD-10-CM | POA: Diagnosis not present

## 2020-03-15 DIAGNOSIS — I129 Hypertensive chronic kidney disease with stage 1 through stage 4 chronic kidney disease, or unspecified chronic kidney disease: Secondary | ICD-10-CM | POA: Diagnosis not present

## 2020-03-15 DIAGNOSIS — F419 Anxiety disorder, unspecified: Secondary | ICD-10-CM | POA: Diagnosis not present

## 2020-03-17 DIAGNOSIS — Z03818 Encounter for observation for suspected exposure to other biological agents ruled out: Secondary | ICD-10-CM | POA: Diagnosis not present

## 2020-03-18 DIAGNOSIS — G3184 Mild cognitive impairment, so stated: Secondary | ICD-10-CM | POA: Diagnosis not present

## 2020-03-18 DIAGNOSIS — Z9181 History of falling: Secondary | ICD-10-CM | POA: Diagnosis not present

## 2020-03-18 DIAGNOSIS — E039 Hypothyroidism, unspecified: Secondary | ICD-10-CM | POA: Diagnosis not present

## 2020-03-18 DIAGNOSIS — F419 Anxiety disorder, unspecified: Secondary | ICD-10-CM | POA: Diagnosis not present

## 2020-03-18 DIAGNOSIS — M159 Polyosteoarthritis, unspecified: Secondary | ICD-10-CM | POA: Diagnosis not present

## 2020-03-18 DIAGNOSIS — N183 Chronic kidney disease, stage 3 unspecified: Secondary | ICD-10-CM | POA: Diagnosis not present

## 2020-03-18 DIAGNOSIS — E1122 Type 2 diabetes mellitus with diabetic chronic kidney disease: Secondary | ICD-10-CM | POA: Diagnosis not present

## 2020-03-18 DIAGNOSIS — K219 Gastro-esophageal reflux disease without esophagitis: Secondary | ICD-10-CM | POA: Diagnosis not present

## 2020-03-18 DIAGNOSIS — I129 Hypertensive chronic kidney disease with stage 1 through stage 4 chronic kidney disease, or unspecified chronic kidney disease: Secondary | ICD-10-CM | POA: Diagnosis not present

## 2020-03-21 DIAGNOSIS — E039 Hypothyroidism, unspecified: Secondary | ICD-10-CM | POA: Diagnosis not present

## 2020-03-21 DIAGNOSIS — Z9181 History of falling: Secondary | ICD-10-CM | POA: Diagnosis not present

## 2020-03-21 DIAGNOSIS — E1122 Type 2 diabetes mellitus with diabetic chronic kidney disease: Secondary | ICD-10-CM | POA: Diagnosis not present

## 2020-03-21 DIAGNOSIS — M159 Polyosteoarthritis, unspecified: Secondary | ICD-10-CM | POA: Diagnosis not present

## 2020-03-21 DIAGNOSIS — F419 Anxiety disorder, unspecified: Secondary | ICD-10-CM | POA: Diagnosis not present

## 2020-03-21 DIAGNOSIS — N183 Chronic kidney disease, stage 3 unspecified: Secondary | ICD-10-CM | POA: Diagnosis not present

## 2020-03-21 DIAGNOSIS — G3184 Mild cognitive impairment, so stated: Secondary | ICD-10-CM | POA: Diagnosis not present

## 2020-03-21 DIAGNOSIS — I129 Hypertensive chronic kidney disease with stage 1 through stage 4 chronic kidney disease, or unspecified chronic kidney disease: Secondary | ICD-10-CM | POA: Diagnosis not present

## 2020-03-21 DIAGNOSIS — K219 Gastro-esophageal reflux disease without esophagitis: Secondary | ICD-10-CM | POA: Diagnosis not present

## 2020-03-23 DIAGNOSIS — K219 Gastro-esophageal reflux disease without esophagitis: Secondary | ICD-10-CM | POA: Diagnosis not present

## 2020-03-23 DIAGNOSIS — E1122 Type 2 diabetes mellitus with diabetic chronic kidney disease: Secondary | ICD-10-CM | POA: Diagnosis not present

## 2020-03-23 DIAGNOSIS — M159 Polyosteoarthritis, unspecified: Secondary | ICD-10-CM | POA: Diagnosis not present

## 2020-03-23 DIAGNOSIS — I129 Hypertensive chronic kidney disease with stage 1 through stage 4 chronic kidney disease, or unspecified chronic kidney disease: Secondary | ICD-10-CM | POA: Diagnosis not present

## 2020-03-23 DIAGNOSIS — E039 Hypothyroidism, unspecified: Secondary | ICD-10-CM | POA: Diagnosis not present

## 2020-03-23 DIAGNOSIS — F419 Anxiety disorder, unspecified: Secondary | ICD-10-CM | POA: Diagnosis not present

## 2020-03-23 DIAGNOSIS — G3184 Mild cognitive impairment, so stated: Secondary | ICD-10-CM | POA: Diagnosis not present

## 2020-03-23 DIAGNOSIS — Z9181 History of falling: Secondary | ICD-10-CM | POA: Diagnosis not present

## 2020-03-23 DIAGNOSIS — N183 Chronic kidney disease, stage 3 unspecified: Secondary | ICD-10-CM | POA: Diagnosis not present

## 2020-03-31 DIAGNOSIS — E1122 Type 2 diabetes mellitus with diabetic chronic kidney disease: Secondary | ICD-10-CM | POA: Diagnosis not present

## 2020-03-31 DIAGNOSIS — I129 Hypertensive chronic kidney disease with stage 1 through stage 4 chronic kidney disease, or unspecified chronic kidney disease: Secondary | ICD-10-CM | POA: Diagnosis not present

## 2020-03-31 DIAGNOSIS — N183 Chronic kidney disease, stage 3 unspecified: Secondary | ICD-10-CM | POA: Diagnosis not present

## 2020-03-31 DIAGNOSIS — E039 Hypothyroidism, unspecified: Secondary | ICD-10-CM | POA: Diagnosis not present

## 2020-03-31 DIAGNOSIS — K219 Gastro-esophageal reflux disease without esophagitis: Secondary | ICD-10-CM | POA: Diagnosis not present

## 2020-03-31 DIAGNOSIS — M159 Polyosteoarthritis, unspecified: Secondary | ICD-10-CM | POA: Diagnosis not present

## 2020-03-31 DIAGNOSIS — F419 Anxiety disorder, unspecified: Secondary | ICD-10-CM | POA: Diagnosis not present

## 2020-03-31 DIAGNOSIS — G3184 Mild cognitive impairment, so stated: Secondary | ICD-10-CM | POA: Diagnosis not present

## 2020-03-31 DIAGNOSIS — Z03818 Encounter for observation for suspected exposure to other biological agents ruled out: Secondary | ICD-10-CM | POA: Diagnosis not present

## 2020-03-31 DIAGNOSIS — Z9181 History of falling: Secondary | ICD-10-CM | POA: Diagnosis not present

## 2020-04-01 DIAGNOSIS — F331 Major depressive disorder, recurrent, moderate: Secondary | ICD-10-CM | POA: Diagnosis not present

## 2020-04-01 DIAGNOSIS — F411 Generalized anxiety disorder: Secondary | ICD-10-CM | POA: Diagnosis not present

## 2020-04-04 DIAGNOSIS — Z9181 History of falling: Secondary | ICD-10-CM | POA: Diagnosis not present

## 2020-04-04 DIAGNOSIS — M159 Polyosteoarthritis, unspecified: Secondary | ICD-10-CM | POA: Diagnosis not present

## 2020-04-04 DIAGNOSIS — E1122 Type 2 diabetes mellitus with diabetic chronic kidney disease: Secondary | ICD-10-CM | POA: Diagnosis not present

## 2020-04-04 DIAGNOSIS — E039 Hypothyroidism, unspecified: Secondary | ICD-10-CM | POA: Diagnosis not present

## 2020-04-04 DIAGNOSIS — N183 Chronic kidney disease, stage 3 unspecified: Secondary | ICD-10-CM | POA: Diagnosis not present

## 2020-04-04 DIAGNOSIS — F419 Anxiety disorder, unspecified: Secondary | ICD-10-CM | POA: Diagnosis not present

## 2020-04-04 DIAGNOSIS — K219 Gastro-esophageal reflux disease without esophagitis: Secondary | ICD-10-CM | POA: Diagnosis not present

## 2020-04-04 DIAGNOSIS — I129 Hypertensive chronic kidney disease with stage 1 through stage 4 chronic kidney disease, or unspecified chronic kidney disease: Secondary | ICD-10-CM | POA: Diagnosis not present

## 2020-04-04 DIAGNOSIS — G3184 Mild cognitive impairment, so stated: Secondary | ICD-10-CM | POA: Diagnosis not present

## 2020-04-06 DIAGNOSIS — Z9181 History of falling: Secondary | ICD-10-CM | POA: Diagnosis not present

## 2020-04-06 DIAGNOSIS — F419 Anxiety disorder, unspecified: Secondary | ICD-10-CM | POA: Diagnosis not present

## 2020-04-06 DIAGNOSIS — I129 Hypertensive chronic kidney disease with stage 1 through stage 4 chronic kidney disease, or unspecified chronic kidney disease: Secondary | ICD-10-CM | POA: Diagnosis not present

## 2020-04-06 DIAGNOSIS — Z03818 Encounter for observation for suspected exposure to other biological agents ruled out: Secondary | ICD-10-CM | POA: Diagnosis not present

## 2020-04-06 DIAGNOSIS — E1122 Type 2 diabetes mellitus with diabetic chronic kidney disease: Secondary | ICD-10-CM | POA: Diagnosis not present

## 2020-04-06 DIAGNOSIS — M159 Polyosteoarthritis, unspecified: Secondary | ICD-10-CM | POA: Diagnosis not present

## 2020-04-06 DIAGNOSIS — K219 Gastro-esophageal reflux disease without esophagitis: Secondary | ICD-10-CM | POA: Diagnosis not present

## 2020-04-06 DIAGNOSIS — E039 Hypothyroidism, unspecified: Secondary | ICD-10-CM | POA: Diagnosis not present

## 2020-04-06 DIAGNOSIS — G3184 Mild cognitive impairment, so stated: Secondary | ICD-10-CM | POA: Diagnosis not present

## 2020-04-06 DIAGNOSIS — N183 Chronic kidney disease, stage 3 unspecified: Secondary | ICD-10-CM | POA: Diagnosis not present

## 2020-04-07 DIAGNOSIS — F411 Generalized anxiety disorder: Secondary | ICD-10-CM | POA: Diagnosis not present

## 2020-04-07 DIAGNOSIS — F331 Major depressive disorder, recurrent, moderate: Secondary | ICD-10-CM | POA: Diagnosis not present

## 2020-04-09 DIAGNOSIS — M6281 Muscle weakness (generalized): Secondary | ICD-10-CM | POA: Diagnosis not present

## 2020-04-09 DIAGNOSIS — G4733 Obstructive sleep apnea (adult) (pediatric): Secondary | ICD-10-CM | POA: Diagnosis not present

## 2020-04-11 DIAGNOSIS — Z9181 History of falling: Secondary | ICD-10-CM | POA: Diagnosis not present

## 2020-04-11 DIAGNOSIS — N183 Chronic kidney disease, stage 3 unspecified: Secondary | ICD-10-CM | POA: Diagnosis not present

## 2020-04-11 DIAGNOSIS — F419 Anxiety disorder, unspecified: Secondary | ICD-10-CM | POA: Diagnosis not present

## 2020-04-11 DIAGNOSIS — K219 Gastro-esophageal reflux disease without esophagitis: Secondary | ICD-10-CM | POA: Diagnosis not present

## 2020-04-11 DIAGNOSIS — E039 Hypothyroidism, unspecified: Secondary | ICD-10-CM | POA: Diagnosis not present

## 2020-04-11 DIAGNOSIS — E1122 Type 2 diabetes mellitus with diabetic chronic kidney disease: Secondary | ICD-10-CM | POA: Diagnosis not present

## 2020-04-11 DIAGNOSIS — G3184 Mild cognitive impairment, so stated: Secondary | ICD-10-CM | POA: Diagnosis not present

## 2020-04-11 DIAGNOSIS — M159 Polyosteoarthritis, unspecified: Secondary | ICD-10-CM | POA: Diagnosis not present

## 2020-04-11 DIAGNOSIS — I129 Hypertensive chronic kidney disease with stage 1 through stage 4 chronic kidney disease, or unspecified chronic kidney disease: Secondary | ICD-10-CM | POA: Diagnosis not present

## 2020-04-12 DIAGNOSIS — I10 Essential (primary) hypertension: Secondary | ICD-10-CM | POA: Diagnosis not present

## 2020-04-12 DIAGNOSIS — E039 Hypothyroidism, unspecified: Secondary | ICD-10-CM | POA: Diagnosis not present

## 2020-04-12 DIAGNOSIS — E1122 Type 2 diabetes mellitus with diabetic chronic kidney disease: Secondary | ICD-10-CM | POA: Diagnosis not present

## 2020-04-13 DIAGNOSIS — N183 Chronic kidney disease, stage 3 unspecified: Secondary | ICD-10-CM | POA: Diagnosis not present

## 2020-04-13 DIAGNOSIS — Z9181 History of falling: Secondary | ICD-10-CM | POA: Diagnosis not present

## 2020-04-13 DIAGNOSIS — M159 Polyosteoarthritis, unspecified: Secondary | ICD-10-CM | POA: Diagnosis not present

## 2020-04-13 DIAGNOSIS — E039 Hypothyroidism, unspecified: Secondary | ICD-10-CM | POA: Diagnosis not present

## 2020-04-13 DIAGNOSIS — F419 Anxiety disorder, unspecified: Secondary | ICD-10-CM | POA: Diagnosis not present

## 2020-04-13 DIAGNOSIS — E1122 Type 2 diabetes mellitus with diabetic chronic kidney disease: Secondary | ICD-10-CM | POA: Diagnosis not present

## 2020-04-13 DIAGNOSIS — I129 Hypertensive chronic kidney disease with stage 1 through stage 4 chronic kidney disease, or unspecified chronic kidney disease: Secondary | ICD-10-CM | POA: Diagnosis not present

## 2020-04-13 DIAGNOSIS — K219 Gastro-esophageal reflux disease without esophagitis: Secondary | ICD-10-CM | POA: Diagnosis not present

## 2020-04-13 DIAGNOSIS — G3184 Mild cognitive impairment, so stated: Secondary | ICD-10-CM | POA: Diagnosis not present

## 2020-04-18 DIAGNOSIS — E1122 Type 2 diabetes mellitus with diabetic chronic kidney disease: Secondary | ICD-10-CM | POA: Diagnosis not present

## 2020-04-18 DIAGNOSIS — M159 Polyosteoarthritis, unspecified: Secondary | ICD-10-CM | POA: Diagnosis not present

## 2020-04-18 DIAGNOSIS — Z9181 History of falling: Secondary | ICD-10-CM | POA: Diagnosis not present

## 2020-04-18 DIAGNOSIS — E039 Hypothyroidism, unspecified: Secondary | ICD-10-CM | POA: Diagnosis not present

## 2020-04-18 DIAGNOSIS — K219 Gastro-esophageal reflux disease without esophagitis: Secondary | ICD-10-CM | POA: Diagnosis not present

## 2020-04-18 DIAGNOSIS — G3184 Mild cognitive impairment, so stated: Secondary | ICD-10-CM | POA: Diagnosis not present

## 2020-04-18 DIAGNOSIS — F419 Anxiety disorder, unspecified: Secondary | ICD-10-CM | POA: Diagnosis not present

## 2020-04-18 DIAGNOSIS — I129 Hypertensive chronic kidney disease with stage 1 through stage 4 chronic kidney disease, or unspecified chronic kidney disease: Secondary | ICD-10-CM | POA: Diagnosis not present

## 2020-04-18 DIAGNOSIS — N183 Chronic kidney disease, stage 3 unspecified: Secondary | ICD-10-CM | POA: Diagnosis not present

## 2020-04-20 DIAGNOSIS — M159 Polyosteoarthritis, unspecified: Secondary | ICD-10-CM | POA: Diagnosis not present

## 2020-04-20 DIAGNOSIS — F331 Major depressive disorder, recurrent, moderate: Secondary | ICD-10-CM | POA: Diagnosis not present

## 2020-04-20 DIAGNOSIS — F411 Generalized anxiety disorder: Secondary | ICD-10-CM | POA: Diagnosis not present

## 2020-04-20 DIAGNOSIS — Z9181 History of falling: Secondary | ICD-10-CM | POA: Diagnosis not present

## 2020-04-20 DIAGNOSIS — G3184 Mild cognitive impairment, so stated: Secondary | ICD-10-CM | POA: Diagnosis not present

## 2020-04-20 DIAGNOSIS — K219 Gastro-esophageal reflux disease without esophagitis: Secondary | ICD-10-CM | POA: Diagnosis not present

## 2020-04-20 DIAGNOSIS — I129 Hypertensive chronic kidney disease with stage 1 through stage 4 chronic kidney disease, or unspecified chronic kidney disease: Secondary | ICD-10-CM | POA: Diagnosis not present

## 2020-04-20 DIAGNOSIS — E039 Hypothyroidism, unspecified: Secondary | ICD-10-CM | POA: Diagnosis not present

## 2020-04-20 DIAGNOSIS — E1122 Type 2 diabetes mellitus with diabetic chronic kidney disease: Secondary | ICD-10-CM | POA: Diagnosis not present

## 2020-04-20 DIAGNOSIS — N183 Chronic kidney disease, stage 3 unspecified: Secondary | ICD-10-CM | POA: Diagnosis not present

## 2020-04-20 DIAGNOSIS — F419 Anxiety disorder, unspecified: Secondary | ICD-10-CM | POA: Diagnosis not present

## 2020-04-25 DIAGNOSIS — I129 Hypertensive chronic kidney disease with stage 1 through stage 4 chronic kidney disease, or unspecified chronic kidney disease: Secondary | ICD-10-CM | POA: Diagnosis not present

## 2020-04-25 DIAGNOSIS — M159 Polyosteoarthritis, unspecified: Secondary | ICD-10-CM | POA: Diagnosis not present

## 2020-04-25 DIAGNOSIS — F419 Anxiety disorder, unspecified: Secondary | ICD-10-CM | POA: Diagnosis not present

## 2020-04-25 DIAGNOSIS — N183 Chronic kidney disease, stage 3 unspecified: Secondary | ICD-10-CM | POA: Diagnosis not present

## 2020-04-25 DIAGNOSIS — Z9181 History of falling: Secondary | ICD-10-CM | POA: Diagnosis not present

## 2020-04-25 DIAGNOSIS — G3184 Mild cognitive impairment, so stated: Secondary | ICD-10-CM | POA: Diagnosis not present

## 2020-04-25 DIAGNOSIS — E1122 Type 2 diabetes mellitus with diabetic chronic kidney disease: Secondary | ICD-10-CM | POA: Diagnosis not present

## 2020-04-25 DIAGNOSIS — K219 Gastro-esophageal reflux disease without esophagitis: Secondary | ICD-10-CM | POA: Diagnosis not present

## 2020-04-25 DIAGNOSIS — E039 Hypothyroidism, unspecified: Secondary | ICD-10-CM | POA: Diagnosis not present

## 2020-04-27 DIAGNOSIS — E1122 Type 2 diabetes mellitus with diabetic chronic kidney disease: Secondary | ICD-10-CM | POA: Diagnosis not present

## 2020-04-27 DIAGNOSIS — K219 Gastro-esophageal reflux disease without esophagitis: Secondary | ICD-10-CM | POA: Diagnosis not present

## 2020-04-27 DIAGNOSIS — E039 Hypothyroidism, unspecified: Secondary | ICD-10-CM | POA: Diagnosis not present

## 2020-04-27 DIAGNOSIS — F419 Anxiety disorder, unspecified: Secondary | ICD-10-CM | POA: Diagnosis not present

## 2020-04-27 DIAGNOSIS — G3184 Mild cognitive impairment, so stated: Secondary | ICD-10-CM | POA: Diagnosis not present

## 2020-04-27 DIAGNOSIS — I129 Hypertensive chronic kidney disease with stage 1 through stage 4 chronic kidney disease, or unspecified chronic kidney disease: Secondary | ICD-10-CM | POA: Diagnosis not present

## 2020-04-27 DIAGNOSIS — M159 Polyosteoarthritis, unspecified: Secondary | ICD-10-CM | POA: Diagnosis not present

## 2020-04-27 DIAGNOSIS — Z9181 History of falling: Secondary | ICD-10-CM | POA: Diagnosis not present

## 2020-04-27 DIAGNOSIS — N183 Chronic kidney disease, stage 3 unspecified: Secondary | ICD-10-CM | POA: Diagnosis not present

## 2020-04-28 DIAGNOSIS — F331 Major depressive disorder, recurrent, moderate: Secondary | ICD-10-CM | POA: Diagnosis not present

## 2020-04-28 DIAGNOSIS — F411 Generalized anxiety disorder: Secondary | ICD-10-CM | POA: Diagnosis not present

## 2020-05-02 ENCOUNTER — Telehealth: Payer: Self-pay | Admitting: Internal Medicine

## 2020-05-02 DIAGNOSIS — N183 Chronic kidney disease, stage 3 unspecified: Secondary | ICD-10-CM | POA: Diagnosis not present

## 2020-05-02 DIAGNOSIS — M25562 Pain in left knee: Secondary | ICD-10-CM

## 2020-05-02 DIAGNOSIS — E039 Hypothyroidism, unspecified: Secondary | ICD-10-CM | POA: Diagnosis not present

## 2020-05-02 DIAGNOSIS — Z9181 History of falling: Secondary | ICD-10-CM | POA: Diagnosis not present

## 2020-05-02 DIAGNOSIS — E876 Hypokalemia: Secondary | ICD-10-CM | POA: Diagnosis not present

## 2020-05-02 DIAGNOSIS — K219 Gastro-esophageal reflux disease without esophagitis: Secondary | ICD-10-CM | POA: Diagnosis not present

## 2020-05-02 DIAGNOSIS — M25561 Pain in right knee: Secondary | ICD-10-CM

## 2020-05-02 DIAGNOSIS — I129 Hypertensive chronic kidney disease with stage 1 through stage 4 chronic kidney disease, or unspecified chronic kidney disease: Secondary | ICD-10-CM | POA: Diagnosis not present

## 2020-05-02 DIAGNOSIS — E1122 Type 2 diabetes mellitus with diabetic chronic kidney disease: Secondary | ICD-10-CM | POA: Diagnosis not present

## 2020-05-02 DIAGNOSIS — F419 Anxiety disorder, unspecified: Secondary | ICD-10-CM | POA: Diagnosis not present

## 2020-05-02 DIAGNOSIS — I10 Essential (primary) hypertension: Secondary | ICD-10-CM | POA: Diagnosis not present

## 2020-05-02 DIAGNOSIS — G3184 Mild cognitive impairment, so stated: Secondary | ICD-10-CM | POA: Diagnosis not present

## 2020-05-02 DIAGNOSIS — M159 Polyosteoarthritis, unspecified: Secondary | ICD-10-CM | POA: Diagnosis not present

## 2020-05-02 NOTE — Telephone Encounter (Signed)
Patient states she needs the shots in her legs but since she doesn't have an orthopedic here she is wondering if Dr. Ronnald Ramp can do them or if she needs to be referred to ortho. Also requesting to speak to Dr. Ronnald Ramp assistant about this.

## 2020-05-03 NOTE — Addendum Note (Signed)
Addended by: Biagio Borg on: 05/03/2020 05:35 PM   Modules accepted: Orders

## 2020-05-03 NOTE — Telephone Encounter (Signed)
Update made.

## 2020-05-03 NOTE — Telephone Encounter (Signed)
Dr Ronnald Ramp does not normally do hip or knee injections, so we can refer to sport med if she likes, just let us know and which joint

## 2020-05-03 NOTE — Telephone Encounter (Signed)
Pt would like to proceed with sports medicine referral. She stated that the her issue is with both knees and occasionally her shoulders.    [place]:  Pt stated that her address needed to be updated due to her being moved to [place]:  [address]  Please update

## 2020-05-05 DIAGNOSIS — E039 Hypothyroidism, unspecified: Secondary | ICD-10-CM | POA: Diagnosis not present

## 2020-05-05 DIAGNOSIS — E119 Type 2 diabetes mellitus without complications: Secondary | ICD-10-CM | POA: Diagnosis not present

## 2020-05-10 DIAGNOSIS — G4733 Obstructive sleep apnea (adult) (pediatric): Secondary | ICD-10-CM | POA: Diagnosis not present

## 2020-05-10 DIAGNOSIS — M6281 Muscle weakness (generalized): Secondary | ICD-10-CM | POA: Diagnosis not present

## 2020-05-11 ENCOUNTER — Ambulatory Visit: Payer: Self-pay

## 2020-05-11 ENCOUNTER — Encounter: Payer: Self-pay | Admitting: Family Medicine

## 2020-05-11 ENCOUNTER — Other Ambulatory Visit: Payer: Self-pay

## 2020-05-11 ENCOUNTER — Ambulatory Visit: Payer: Medicare HMO | Admitting: Family Medicine

## 2020-05-11 ENCOUNTER — Ambulatory Visit (INDEPENDENT_AMBULATORY_CARE_PROVIDER_SITE_OTHER): Payer: Medicare HMO

## 2020-05-11 VITALS — BP 112/82 | HR 62 | Ht 59.0 in | Wt 156.0 lb

## 2020-05-11 DIAGNOSIS — M25561 Pain in right knee: Secondary | ICD-10-CM | POA: Diagnosis not present

## 2020-05-11 DIAGNOSIS — M19012 Primary osteoarthritis, left shoulder: Secondary | ICD-10-CM | POA: Diagnosis not present

## 2020-05-11 DIAGNOSIS — M25512 Pain in left shoulder: Secondary | ICD-10-CM | POA: Diagnosis not present

## 2020-05-11 DIAGNOSIS — M25562 Pain in left knee: Secondary | ICD-10-CM

## 2020-05-11 DIAGNOSIS — G8929 Other chronic pain: Secondary | ICD-10-CM

## 2020-05-11 MED ORDER — DICLOFENAC SODIUM 1 % EX GEL: 4.0000 g | Freq: Four times a day (QID) | CUTANEOUS | 5 refills | Status: AC

## 2020-05-11 NOTE — Progress Notes (Signed)
Subjective:   I, [name], LAT, ATC acting as a scribe for [name], MD.  I'm seeing this patient as a consultation for Dr. Cathlean Cower. Note will be routed back to referring provider/PCP.  CC: bilateral knee and shoulder pain  HPI: Pt is an 80 y/o female c/o pain in bilat knees ongoing for 2-3 months. Of note, pt suffers from dementia. Pt was seen in the ED following a fall c/o L knee pain. Pt locates knee pain to knee joints. Uses Voltaren gel for pain relief.   Radiates: Not much. Knee mechanical symptoms: None LE weakness:  Aggravates: Sit to stand Treatments tried: Voltaren gel  Dx imaging: 01/16/20 L knee XR  Pt also c/o bilat shoulder pain L>R. Patient feels that she over compensates for left shoulder pain.  Pt locates shoulder pain to anterior aspect especially with flexion.   Radiates: Not significantly Shoulder mechanical symptoms: UE numbness/tingling: UE weakness: Aggravates: Treatments tried:  Dx imaging: 12/28/14 R shoulder XR  Past medical history, Surgical history, Family history, Social history, Allergies, and medications have been entered into the medical record, reviewed.   Review of Systems: No new headache, visual changes, nausea, vomiting, diarrhea, constipation, dizziness, abdominal pain, skin rash, fevers, chills, night sweats, weight loss, swollen lymph nodes, body aches, joint swelling, muscle aches, chest pain, shortness of breath, mood changes, visual or auditory hallucinations.   Objective:    Vitals:   05/11/20 1344  BP: 112/82  Pulse: 62  SpO2: 97%   General: Well Developed, well nourished, and in no acute distress.  Neuro/Psych: Alert and oriented x3, extra-ocular muscles intact, able to move all 4 extremities, sensation grossly intact. Skin: Warm and dry, no rashes noted.  Respiratory: Not using accessory muscles, speaking in full sentences, trachea midline.  Cardiovascular: Pulses palpable, no extremity edema. Abdomen: Does not  appear distended. MSK: Left shoulder normal-appearing Nontender. Decreased range of motion abduction.  Normal external and internal rotation. Decreased strength abduction.  Knees bilaterally normal. Normal motion with crepitation. Intact strength.  Lab and Radiology Results   Procedure: Real-time Ultrasound Guided Injection of right knee superior lateral patellar space Device: Philips Affiniti 50G Images permanently stored and available for review in PACS Verbal informed consent obtained.  Discussed risks and benefits of procedure. Warned about infection bleeding damage to structures skin hypopigmentation and fat atrophy among others. Patient expresses understanding and agreement Time-out conducted.   Noted no overlying erythema, induration, or other signs of local infection.   Skin prepped in a sterile fashion.   Local anesthesia: Topical Ethyl chloride.   With sterile technique and under real time ultrasound guidance:  40 mg of Kenalog and 2 mL of Marcaine injected into knee joint. Fluid seen entering the joint capsule.   Completed without difficulty   Pain immediately resolved suggesting accurate placement of the medication.   Advised to call if fevers/chills, erythema, induration, drainage, or persistent bleeding.   Images permanently stored and available for review in the ultrasound unit.  Impression: Technically successful ultrasound guided injection.     Procedure: Real-time Ultrasound Guided Injection of left knee superior lateral patellar space Device: Philips Affiniti 50G Images permanently stored and available for review in PACS Verbal informed consent obtained.  Discussed risks and benefits of procedure. Warned about infection bleeding damage to structures skin hypopigmentation and fat atrophy among others. Patient expresses understanding and agreement Time-out conducted.   Noted no overlying erythema, induration, or other signs of local infection.   Skin prepped in  a sterile fashion.   Local anesthesia: Topical Ethyl chloride.   With sterile technique and under real time ultrasound guidance:  40 mg of Kenalog and 2 L of Marcaine injected into joint. Fluid seen entering the joint capsule.   Completed without difficulty   Pain immediately resolved suggesting accurate placement of the medication.   Advised to call if fevers/chills, erythema, induration, drainage, or persistent bleeding.   Images permanently stored and available for review in the ultrasound unit.  Impression: Technically successful ultrasound guided injection.   EXAM: LEFT KNEE - COMPLETE 4+ VIEW  COMPARISON:  None.  FINDINGS: No acute bony abnormality. Specifically, no fracture, subluxation, or dislocation. Joint spaces maintained. No joint effusion. Sclerotic area in the distal femur could reflect bone infarct or enchondroma.  IMPRESSION: No acute bony abnormality.   Electronically Signed   By: Rolm Baptise M.D.   On: 01/16/2020 17:29 I, [name], personally (independently) visualized and performed the interpretation of the images attached in this note.   X-ray images left shoulder obtained today personally and independently interpreted. Mild glenohumeral DJD.  High riding humeral head.  No fractures. Await formal radiology review   Impression and Recommendations:    Assessment and Plan: 80 y.o. female with bilateral knee pain due to DJD.  Plan for bilateral knee injection with steroids.  She has had similar procedures in the past which worked well.  Continue current treatment.  Voltaren gel prescribed.  Left shoulder pain: This issue is put on a bit of the back burner today.  Evaluate further with x-ray which does show some arthritis changes however I do think the main cause is probably impingement.  We will follow up with this issue in the near future to evaluate it further and probably proceed with steroid injection.Marland Kitchen  PDMP not reviewed this encounter. Orders  Placed This Encounter  Procedures  . [place] LIMITED JOINT SPACE STRUCTURES LOW BILAT(NO LINKED CHARGES)    Order Specific Question:   Reason for Exam (SYMPTOM  OR DIAGNOSIS REQUIRED)    Answer:   bilateral knee pain    Order Specific Question:   Preferred imaging location?    Answer:   [place]  . DG Shoulder Left    Standing Status:   Future    Number of Occurrences:   1    Standing Expiration Date:   05/11/2021    Order Specific Question:   Reason for Exam (SYMPTOM  OR DIAGNOSIS REQUIRED)    Answer:   eval shoulder pain    Order Specific Question:   Preferred imaging location?    Answer:   [facility]   Meds ordered this encounter  Medications  . diclofenac Sodium (VOLTAREN) 1 % GEL    Sig: Apply 4 g topically 4 (four) times daily.    Dispense:  100 g    Refill:  5    Discussed warning signs or symptoms. Please see discharge instructions. Patient expresses understanding.   The above documentation has been reviewed and is accurate and complete [name], M.D.

## 2020-05-11 NOTE — Patient Instructions (Addendum)
Thank you for coming in today.  Please get an Xray today before you leave  Return as scheduled on March 10th for shoulder evaluation and injection.

## 2020-05-13 NOTE — Progress Notes (Signed)
X-ray left shoulder shows mild arthritis

## 2020-05-23 ENCOUNTER — Ambulatory Visit: Payer: Medicare HMO | Admitting: Internal Medicine

## 2020-05-23 NOTE — Progress Notes (Signed)
I, [name], LAT, ATC acting as a scribe for [name], MD.  [name] is a 80 y.o. female who presents to [facility] today for L shoulder pain. Pt was last seen by Dr. Georgina Snell on 05/11/20 for chronic bilat knee pain due to DJD and was given bilat knee steroid injections and L shoulder pain would need to be further addressed at a f/u appointment. Today, pt reports L shoulder is painful. Pt notes she has upcoming travel to [place] to clean out her other home. Pt locates shoulder pain to superior and posterior aspect of L shoulder  Neck pain: no Radiates: no L shoulder mechanical symptoms: no UE Numbness/tingling: no UE Weakness: no Aggravates: sleeping Treatments tried: creams, tylenol  Pt also c/o R hand and forearm swelling ongoing for a few year. Pt notes sometimes swelling will decrease. Pt has a visible mass on dorsal aspect of R hand and distal forearm appears significantly swollen.  Dx imaging: 05/11/20 L shoulder XR  01/16/20 C-spine CT   Pertinent review of systems: No fevers or chills.  Relevant historical information: Hypertension and diabetes.  CKD.   Exam:  BP 138/88 (BP Location: Left Arm, Patient Position: Sitting, Cuff Size: Normal)   Pulse 62   Ht 4\' 11"  (1.499 m)   Wt 161 lb 3.2 oz (73.1 kg)   SpO2 98%   BMI 32.56 kg/m  General: Well Developed, well nourished, and in no acute distress.   MSK: Left shoulder normal-appearing Normal motion pain with abduction.  Intact strength.  Positive Hawkins and Neer's test.  Right hand and wrist slight swollen at dorsal wrist and dorsal second and third MCP.  Normal wrist and hand motion.  Intact strength. Minimally tender to palpation dorsal wrist and second and third MCP.    Lab and Radiology Results  EXAM: LEFT SHOULDER - 2+ VIEW  COMPARISON:  None.  FINDINGS: There is no evidence of fracture or dislocation. Mild glenohumeral joint space narrowing and inferior spurring.  There is a small subacromial spur. No erosion or avascular necrosis. Soft tissues are unremarkable.  IMPRESSION: Mild glenohumeral osteoarthritis with inferior spurring and small subacromial spur.   Electronically Signed   By: Keith Rake M.D.   On: 05/12/2020 16:51  I, [name], personally (independently) visualized and performed the interpretation of the images attached in this note.   Procedure: Real-time Ultrasound Guided Injection of left shoulder subacromial bursa Device: Philips Affiniti 50G Images permanently stored and available for review in PACS Verbal informed consent obtained.  Discussed risks and benefits of procedure. Warned about infection bleeding damage to structures skin hypopigmentation and fat atrophy among others. Patient expresses understanding and agreement Time-out conducted.   Noted no overlying erythema, induration, or other signs of local infection.   Skin prepped in a sterile fashion.   Local anesthesia: Topical Ethyl chloride.   With sterile technique and under real time ultrasound guidance:  40 mg of Kenalog and 2 mL of Marcaine injected into subacromial bursa. Fluid seen entering the bursa.   Completed without difficulty   Pain immediately resolved suggesting accurate placement of the medication.   Advised to call if fevers/chills, erythema, induration, drainage, or persistent bleeding.   Images permanently stored and available for review in the ultrasound unit.  Impression: Technically successful ultrasound guided injection.     Diagnostic Limited MSK Ultrasound of: Right dorsal wrist and second and third MCP Degenerative changes and small effusion present at dorsal wrist.  No significant  tenosynovitis. Mild synovitis present at second and third MCPs Impression: DJD and mild effusion dorsal wrist and second and third MCPs     Assessment and Plan: 80 y.o. female with  Left shoulder pain due to subacromial bursitis.  Treated with  subacromial injection with significant improvement in pain symptoms following injection.  Plan for home exercise program and injection as above.  Recheck in 3 months.  Wrist and hand pain due to effusion.  Ongoing chronic issue.  Symptoms appear to be quite mild today per patient.  Treatment with Voltaren gel.  Recheck 3 months or sooner if needed.   PDMP not reviewed this encounter. Orders Placed This Encounter  Procedures  . [place] LIMITED JOINT SPACE STRUCTURES UP LEFT(NO LINKED CHARGES)    Standing Status:   Future    Number of Occurrences:   1    Standing Expiration Date:   11/26/2020    Order Specific Question:   Reason for Exam (SYMPTOM  OR DIAGNOSIS REQUIRED)    Answer:   chronic left shoulder pain    Order Specific Question:   Preferred imaging location?    Answer:   [place]   No orders of the defined types were placed in this encounter.    Discussed warning signs or symptoms. Please see discharge instructions. Patient expresses understanding.   The above documentation has been reviewed and is accurate and complete [name], M.D.

## 2020-05-26 ENCOUNTER — Ambulatory Visit: Payer: Medicare HMO | Admitting: Family Medicine

## 2020-05-26 ENCOUNTER — Other Ambulatory Visit: Payer: Self-pay

## 2020-05-26 ENCOUNTER — Ambulatory Visit (INDEPENDENT_AMBULATORY_CARE_PROVIDER_SITE_OTHER): Payer: Medicare HMO | Admitting: Internal Medicine

## 2020-05-26 ENCOUNTER — Ambulatory Visit: Payer: Self-pay

## 2020-05-26 ENCOUNTER — Encounter: Payer: Self-pay | Admitting: Internal Medicine

## 2020-05-26 VITALS — BP 166/92 | HR 63 | Temp 98.1°F | Resp 16 | Ht 59.0 in | Wt 161.0 lb

## 2020-05-26 VITALS — BP 138/88 | HR 62 | Ht 59.0 in | Wt 161.2 lb

## 2020-05-26 DIAGNOSIS — M79641 Pain in right hand: Secondary | ICD-10-CM | POA: Diagnosis not present

## 2020-05-26 DIAGNOSIS — H903 Sensorineural hearing loss, bilateral: Secondary | ICD-10-CM

## 2020-05-26 DIAGNOSIS — G8929 Other chronic pain: Secondary | ICD-10-CM

## 2020-05-26 DIAGNOSIS — I10 Essential (primary) hypertension: Secondary | ICD-10-CM | POA: Diagnosis not present

## 2020-05-26 DIAGNOSIS — E118 Type 2 diabetes mellitus with unspecified complications: Secondary | ICD-10-CM | POA: Diagnosis not present

## 2020-05-26 DIAGNOSIS — M25512 Pain in left shoulder: Secondary | ICD-10-CM

## 2020-05-26 DIAGNOSIS — F3342 Major depressive disorder, recurrent, in full remission: Secondary | ICD-10-CM | POA: Diagnosis not present

## 2020-05-26 DIAGNOSIS — E038 Other specified hypothyroidism: Secondary | ICD-10-CM | POA: Diagnosis not present

## 2020-05-26 MED ORDER — LOSARTAN POTASSIUM 100 MG PO TABS: 100.0000 mg | ORAL_TABLET | Freq: Every day | ORAL | 1 refills | Status: DC

## 2020-05-26 MED ORDER — LEVOTHYROXINE SODIUM 75 MCG PO TABS: 75.0000 ug | ORAL_TABLET | Freq: Every day | ORAL | 0 refills | Status: DC

## 2020-05-26 MED ORDER — METFORMIN HCL ER 750 MG PO TB24: 750.0000 mg | ORAL_TABLET | Freq: Every day | ORAL | 0 refills | Status: DC

## 2020-05-26 MED ORDER — ACCU-CHEK GUIDE ME W/DEVICE KIT: 1.0000 | PACK | Freq: Two times a day (BID) | 0 refills | Status: DC | PRN

## 2020-05-26 MED ORDER — FLUOXETINE HCL 20 MG PO CAPS: ORAL_CAPSULE | ORAL | 1 refills | Status: DC

## 2020-05-26 MED ORDER — CARVEDILOL 3.125 MG PO TABS: 3.1250 mg | ORAL_TABLET | Freq: Two times a day (BID) | ORAL | 0 refills | Status: DC

## 2020-05-26 MED ORDER — INDAPAMIDE 2.5 MG PO TABS: 2.5000 mg | ORAL_TABLET | Freq: Every day | ORAL | 0 refills | Status: DC

## 2020-05-26 MED ORDER — ACCU-CHEK GUIDE VI STRP: ORAL_STRIP | 5 refills | Status: DC

## 2020-05-26 MED ORDER — ACCU-CHEK GUIDE CONTROL VI LIQD: 1.0000 | Freq: Two times a day (BID) | 3 refills | Status: DC | PRN

## 2020-05-26 NOTE — Patient Instructions (Addendum)
Thank you for coming in today.   You received an injection today. Seek immediate medical attention if the joint becomes red, extremely painful, or is oozing fluid.   Recheck in 3 months.    

## 2020-05-26 NOTE — Patient Instructions (Signed)

## 2020-05-26 NOTE — Progress Notes (Unsigned)
Subjective:  Patient ID: [name], female    DOB: 02-13-1941  Age: 80 y.o. MRN: [id number]  CC: Hypertension and Diabetes  This visit occurred during the SARS-CoV-2 public health emergency.  Safety protocols were in place, including screening questions prior to the visit, additional usage of staff PPE, and extensive cleaning of exam room while observing appropriate contact time as indicated for disinfecting solutions.    HPI [name] presents for f/up - She is active and denies any recent episodes of chest pain, shortness of breath, palpitations, or diaphoresis.  She complains of weight gain.  Outpatient Medications Prior to Visit  Medication Sig Dispense Refill  . diclofenac Sodium (VOLTAREN) 1 % GEL Apply 4 g topically 4 (four) times daily. 100 g 5  . Multiple Vitamins-Minerals (CENTRUM SILVER PO) Take by mouth daily.    . ONE TOUCH LANCETS MISC Use with One Touch Verio monitor as directed Twice Daily to test blood glucose levels. DX: 250.60 200 each 5  . potassium chloride SA (KLOR-CON) 20 MEQ tablet Take 1 tablet (20 mEq total) by mouth daily. 3 tablet 0  . ALPRAZolam (XANAX) 0.25 MG tablet Take 1 tablet (0.25 mg total) by mouth 2 (two) times daily as needed. 30 tablet 2  . aspirin 81 MG tablet Take 81 mg by mouth daily.    Marland Kitchen azelastine (ASTELIN) 0.1 % nasal spray Place 1 spray into both nostrils 2 (two) times daily. Use in each nostril as directed 30 mL 11  . azithromycin (ZITHROMAX) 500 MG tablet Take 1 tablet (500 mg total) by mouth daily. 3 tablet 0  . Calcium Carbonate-Vitamin D 600-200 MG-UNIT TABS Take by mouth daily.    . carvedilol (COREG) 3.125 MG tablet Take 1 tablet (3.125 mg total) by mouth 2 (two) times daily with a meal. 180 tablet 3  . Cinnamon 500 MG TABS Take by mouth daily.    Marland Kitchen FLUoxetine (PROZAC) 20 MG capsule TAKE 1 CAPSULE AT BEDTIME 90 capsule 3  . fluticasone (FLONASE) 50 MCG/ACT nasal spray Place 2 sprays into both nostrils daily. 16 g 11  .  Ginkgo Biloba 40 MG TABS Take by mouth daily.    Marland Kitchen glucose blood (ONETOUCH VERIO) test strip Test 3 times daily 100 each 12  . indapamide (LOZOL) 2.5 MG tablet Take 1 tablet by mouth  daily 90 tablet 3  . levothyroxine (SYNTHROID, LEVOTHROID) 75 MCG tablet Take 1 tablet by mouth  daily 90 tablet 1  . loratadine (CLARITIN) 10 MG tablet Take 1 tablet (10 mg total) by mouth daily.    Marland Kitchen losartan (COZAAR) 100 MG tablet Take 1 tablet by mouth  daily 90 tablet 1  . prednisoLONE acetate (PRED FORTE) 1 % ophthalmic suspension PLACE 1 DROP INTO BOTH EYES 4 TIMES A DAY  0  . Probiotic Product (PROBIOTIC & ACIDOPHILUS EX ST PO) Take 1 tablet by mouth daily.    . promethazine-dextromethorphan (PROMETHAZINE-DM) 6.25-15 MG/5ML syrup Take 5 mLs by mouth 4 (four) times daily as needed for cough. 118 mL 0  . Red Yeast Rice 600 MG CAPS Take 1 capsule (600 mg total) by mouth daily.    . traMADol (ULTRAM) 50 MG tablet Take 1 tablet (50 mg total) by mouth every 6 (six) hours as needed. 10 tablet 0  . triamcinolone cream (KENALOG) 0.5 % Apply 1 application topically 3 (three) times daily. 30 g 1  . UNABLE TO FIND ACCULAR 0.5%--RIGHT EYE    . diltiazem (DILT-CD) 180 MG 24 hr  capsule Take 1 capsule (180 mg total) by mouth every evening. 90 capsule 3   No facility-administered medications prior to visit.    ROS Review of Systems  Constitutional: Positive for unexpected weight change (wt gain). Negative for diaphoresis and fatigue.  HENT: Positive for hearing loss.   Respiratory: Negative for cough, chest tightness, shortness of breath and wheezing.   Cardiovascular: Negative for chest pain, palpitations and leg swelling.  Gastrointestinal: Negative for abdominal pain, constipation, diarrhea, nausea and vomiting.  Endocrine: Negative for cold intolerance and heat intolerance.  Genitourinary: Negative.  Negative for difficulty urinating.  Musculoskeletal: Positive for arthralgias. Negative for myalgias.  Skin:  Negative.   Neurological: Negative.  Negative for dizziness, weakness and light-headedness.  Hematological: Negative for adenopathy. Does not bruise/bleed easily.  Psychiatric/Behavioral: Negative.     Objective:  BP (!) 166/92   Pulse 63   Temp 98.1 F (36.7 C) (Oral)   Resp 16   Ht 4\' 11"  (1.499 m)   Wt 161 lb (73 kg)   SpO2 98%   BMI 32.52 kg/m   BP Readings from Last 3 Encounters:  05/26/20 (!) 166/92  05/26/20 138/88  05/11/20 112/82    Wt Readings from Last 3 Encounters:  05/26/20 161 lb (73 kg)  05/26/20 161 lb 3.2 oz (73.1 kg)  05/11/20 156 lb (70.8 kg)    Physical Exam Vitals reviewed.  HENT:     Nose: Nose normal.     Mouth/Throat:     Mouth: Mucous membranes are moist.  Eyes:     General: No scleral icterus.    Conjunctiva/sclera: Conjunctivae normal.  Cardiovascular:     Rate and Rhythm: Normal rate and regular rhythm.     Heart sounds: No murmur heard.   Pulmonary:     Effort: Pulmonary effort is normal.     Breath sounds: No stridor. No wheezing, rhonchi or rales.  Abdominal:     General: Abdomen is protuberant. Bowel sounds are normal. There is no distension.     Palpations: Abdomen is soft. There is no hepatomegaly, splenomegaly or mass.  Musculoskeletal:        General: Normal range of motion.     Cervical back: Neck supple.     Right lower leg: No edema.     Left lower leg: No edema.  Lymphadenopathy:     Cervical: No cervical adenopathy.  Skin:    General: Skin is warm and dry.  Neurological:     General: No focal deficit present.     Mental Status: She is alert.     Lab Results  Component Value Date   WBC 9.1 01/16/2020   HGB 11.9 (L) 01/16/2020   HCT 36.2 01/16/2020   PLT 325 01/16/2020   GLUCOSE 101 (H) 01/19/2020   CHOL 166 01/19/2020   TRIG 114.0 01/19/2020   HDL 64.80 01/19/2020   LDLDIRECT 120.6 11/24/2012   LDLCALC 79 01/19/2020   ALT 18 03/08/2015   AST 19 03/08/2015   NA 135 01/19/2020   K 3.6 01/19/2020    CL 97 01/19/2020   CREATININE 1.05 01/19/2020   BUN 19 01/19/2020   CO2 24 01/19/2020   TSH 4.30 01/19/2020   INR 1.0 10/01/2011   HGBA1C 6.2 01/19/2020   MICROALBUR 0.8 06/23/2014    DG Pelvis 1-2 Views  Result Date: 01/16/2020 CLINICAL DATA:  Multiple falls.  Left hip pain EXAM: PELVIS - 1-2 VIEW COMPARISON:  None. FINDINGS: There is no evidence of pelvic fracture or diastasis.  No pelvic bone lesions are seen. IMPRESSION: Negative. Electronically Signed   By: Charlett Nose M.D.   On: 01/16/2020 17:31   CT Head Wo Contrast  Result Date: 01/16/2020 CLINICAL DATA:  Fall, facial trauma EXAM: CT HEAD WITHOUT CONTRAST TECHNIQUE: Contiguous axial images were obtained from the base of the skull through the vertex without intravenous contrast. COMPARISON:  None. FINDINGS: Brain: There is atrophy and chronic small vessel disease changes. No acute intracranial abnormality. Specifically, no hemorrhage, hydrocephalus, mass lesion, acute infarction, or significant intracranial injury. Vascular: No hyperdense vessel or unexpected calcification. Skull: No acute calvarial abnormality. Sinuses/Orbits: Visualized paranasal sinuses and mastoids clear. Orbital soft tissues unremarkable. Other: None IMPRESSION: Atrophy, chronic microvascular disease. No acute intracranial abnormality. Electronically Signed   By: Charlett Nose M.D.   On: 01/16/2020 18:16   CT Cervical Spine Wo Contrast  Result Date: 01/16/2020 CLINICAL DATA:  Fall, neck trauma EXAM: CT CERVICAL SPINE WITHOUT CONTRAST TECHNIQUE: Multidetector CT imaging of the cervical spine was performed without intravenous contrast. Multiplanar CT image reconstructions were also generated. COMPARISON:  None. FINDINGS: Alignment: Negative Skull base and vertebrae: No acute fracture. No primary bone lesion or focal pathologic process. Soft tissues and spinal canal: No prevertebral fluid or swelling. No visible canal hematoma. Disc levels: Mild diffuse degenerative  disc disease and facet disease. Upper chest: No acute findings Other: None IMPRESSION: No acute bony abnormality in the cervical spine. Electronically Signed   By: Charlett Nose M.D.   On: 01/16/2020 18:18   DG Knee Complete 4 Views Left  Result Date: 01/16/2020 CLINICAL DATA:  Fall, left leg pain EXAM: LEFT KNEE - COMPLETE 4+ VIEW COMPARISON:  None. FINDINGS: No acute bony abnormality. Specifically, no fracture, subluxation, or dislocation. Joint spaces maintained. No joint effusion. Sclerotic area in the distal femur could reflect bone infarct or enchondroma. IMPRESSION: No acute bony abnormality. Electronically Signed   By: Charlett Nose M.D.   On: 01/16/2020 17:29   DG Femur Min 2 Views Left  Result Date: 01/16/2020 CLINICAL DATA:  Multiple falls.  Left leg pain, hip pain EXAM: LEFT FEMUR 2 VIEWS COMPARISON:  12/16/2008 FINDINGS: No acute bony abnormality. Specifically, no fracture, subluxation, or dislocation. Sclerotic area in the distal femur could reflect bone infarct or enchondroma. IMPRESSION: No acute bony abnormality. Electronically Signed   By: Charlett Nose M.D.   On: 01/16/2020 17:31    Assessment & Plan:   Thersea was seen today for hypertension and diabetes.  Diagnoses and all orders for this visit:  Essential hypertension- Her BP is well controlled. Will continue the current meds. -     indapamide (LOZOL) 2.5 MG tablet; Take 1 tablet (2.5 mg total) by mouth daily. -     carvedilol (COREG) 3.125 MG tablet; Take 1 tablet (3.125 mg total) by mouth 2 (two) times daily with a meal. -     losartan (COZAAR) 100 MG tablet; Take 1 tablet (100 mg total) by mouth daily.  Type II diabetes mellitus with manifestations (HCC) -     metFORMIN (GLUCOPHAGE XR) 750 MG 24 hr tablet; Take 1 tablet (750 mg total) by mouth daily with breakfast. -     Blood Glucose Monitoring Suppl (ACCU-CHEK GUIDE ME) w/Device KIT; 1 Act by Does not apply route 2 (two) times daily as needed. -     glucose blood  (ACCU-CHEK GUIDE) test strip; Use as instructed -     Blood Glucose Calibration (ACCU-CHEK GUIDE CONTROL) LIQD; 1 Act by In Vitro route  2 (two) times daily as needed. -     losartan (COZAAR) 100 MG tablet; Take 1 tablet (100 mg total) by mouth daily.  Other specified hypothyroidism -     levothyroxine (SYNTHROID) 75 MCG tablet; Take 1 tablet (75 mcg total) by mouth daily.  Recurrent major depressive disorder, in full remission (HCC) -     FLUoxetine (PROZAC) 20 MG capsule; TAKE 1 CAPSULE AT BEDTIME  Sensorineural hearing loss (SNHL) of both ears -     Ambulatory referral to Audiology   I have discontinued Leontyne Nestler's Calcium Carbonate-Vitamin D, Cinnamon, Ginkgo Biloba, aspirin, Probiotic Product (PROBIOTIC & ACIDOPHILUS EX ST PO), Red Yeast Rice, UNABLE TO FIND, triamcinolone cream, loratadine, diltiazem, fluticasone, azelastine, prednisoLONE acetate, ALPRAZolam, azithromycin, promethazine-dextromethorphan, and traMADol. I have also changed her levothyroxine, indapamide, and losartan. Additionally, I am having her start on metFORMIN, Accu-Chek Guide Me, Accu-Chek Guide, and Accu-Chek Guide Control. Lastly, I am having her maintain her Multiple Vitamins-Minerals (CENTRUM SILVER PO), ONE TOUCH LANCETS, potassium chloride SA, diclofenac Sodium, carvedilol, and FLUoxetine.  Meds ordered this encounter  Medications  . levothyroxine (SYNTHROID) 75 MCG tablet    Sig: Take 1 tablet (75 mcg total) by mouth daily.    Dispense:  90 tablet    Refill:  0  . indapamide (LOZOL) 2.5 MG tablet    Sig: Take 1 tablet (2.5 mg total) by mouth daily.    Dispense:  90 tablet    Refill:  0  . metFORMIN (GLUCOPHAGE XR) 750 MG 24 hr tablet    Sig: Take 1 tablet (750 mg total) by mouth daily with breakfast.    Dispense:  90 tablet    Refill:  0  . Blood Glucose Monitoring Suppl (ACCU-CHEK GUIDE ME) w/Device KIT    Sig: 1 Act by Does not apply route 2 (two) times daily as needed.    Dispense:  2 kit     Refill:  0  . glucose blood (ACCU-CHEK GUIDE) test strip    Sig: Use as instructed    Dispense:  100 each    Refill:  5  . Blood Glucose Calibration (ACCU-CHEK GUIDE CONTROL) LIQD    Sig: 1 Act by In Vitro route 2 (two) times daily as needed.    Dispense:  1 each    Refill:  3  . carvedilol (COREG) 3.125 MG tablet    Sig: Take 1 tablet (3.125 mg total) by mouth 2 (two) times daily with a meal.    Dispense:  180 tablet    Refill:  0  . FLUoxetine (PROZAC) 20 MG capsule    Sig: TAKE 1 CAPSULE AT BEDTIME    Dispense:  90 capsule    Refill:  1  . losartan (COZAAR) 100 MG tablet    Sig: Take 1 tablet (100 mg total) by mouth daily.    Dispense:  90 tablet    Refill:  1     Follow-up: Return in about 3 months (around 08/26/2020).  Scarlette Calico, MD

## 2020-05-27 ENCOUNTER — Encounter: Payer: Self-pay | Admitting: Internal Medicine

## 2020-05-27 DIAGNOSIS — F331 Major depressive disorder, recurrent, moderate: Secondary | ICD-10-CM | POA: Diagnosis not present

## 2020-05-27 DIAGNOSIS — F411 Generalized anxiety disorder: Secondary | ICD-10-CM | POA: Diagnosis not present

## 2020-05-27 NOTE — Telephone Encounter (Signed)
error 

## 2020-05-30 DIAGNOSIS — E1122 Type 2 diabetes mellitus with diabetic chronic kidney disease: Secondary | ICD-10-CM | POA: Diagnosis not present

## 2020-05-30 DIAGNOSIS — E039 Hypothyroidism, unspecified: Secondary | ICD-10-CM | POA: Diagnosis not present

## 2020-05-30 DIAGNOSIS — I10 Essential (primary) hypertension: Secondary | ICD-10-CM | POA: Diagnosis not present

## 2020-06-07 DIAGNOSIS — M6281 Muscle weakness (generalized): Secondary | ICD-10-CM | POA: Diagnosis not present

## 2020-06-07 DIAGNOSIS — G4733 Obstructive sleep apnea (adult) (pediatric): Secondary | ICD-10-CM | POA: Diagnosis not present

## 2020-06-10 ENCOUNTER — Ambulatory Visit (INDEPENDENT_AMBULATORY_CARE_PROVIDER_SITE_OTHER): Payer: Medicare HMO | Admitting: Internal Medicine

## 2020-06-10 ENCOUNTER — Encounter: Payer: Self-pay | Admitting: Internal Medicine

## 2020-06-10 ENCOUNTER — Ambulatory Visit (INDEPENDENT_AMBULATORY_CARE_PROVIDER_SITE_OTHER): Payer: Medicare HMO

## 2020-06-10 VITALS — BP 126/78 | HR 64 | Temp 98.7°F | Ht 59.0 in | Wt 162.0 lb

## 2020-06-10 DIAGNOSIS — J9811 Atelectasis: Secondary | ICD-10-CM | POA: Diagnosis not present

## 2020-06-10 DIAGNOSIS — E118 Type 2 diabetes mellitus with unspecified complications: Secondary | ICD-10-CM

## 2020-06-10 DIAGNOSIS — J189 Pneumonia, unspecified organism: Secondary | ICD-10-CM | POA: Diagnosis not present

## 2020-06-10 DIAGNOSIS — R059 Cough, unspecified: Secondary | ICD-10-CM

## 2020-06-10 DIAGNOSIS — F3342 Major depressive disorder, recurrent, in full remission: Secondary | ICD-10-CM

## 2020-06-10 DIAGNOSIS — I10 Essential (primary) hypertension: Secondary | ICD-10-CM

## 2020-06-10 DIAGNOSIS — R0602 Shortness of breath: Secondary | ICD-10-CM | POA: Diagnosis not present

## 2020-06-10 MED ORDER — LOSARTAN POTASSIUM 100 MG PO TABS: 100.0000 mg | ORAL_TABLET | Freq: Every day | ORAL | 1 refills | Status: DC

## 2020-06-10 MED ORDER — FLUOXETINE HCL 20 MG PO CAPS: ORAL_CAPSULE | ORAL | 1 refills | Status: DC

## 2020-06-10 MED ORDER — NUZYRA 150 MG PO TABS: 2.0000 | ORAL_TABLET | Freq: Two times a day (BID) | ORAL | 0 refills | Status: AC

## 2020-06-10 MED ORDER — PROMETHAZINE-DM 6.25-15 MG/5ML PO SYRP: 5.0000 mL | ORAL_SOLUTION | Freq: Four times a day (QID) | ORAL | 0 refills | Status: AC | PRN

## 2020-06-10 MED ORDER — NUZYRA 150 MG PO TABS: 2.0000 | ORAL_TABLET | Freq: Every day | ORAL | 0 refills | Status: DC

## 2020-06-10 MED ORDER — ACCU-CHEK GUIDE CONTROL VI LIQD: 1.0000 | Freq: Two times a day (BID) | 3 refills | Status: DC | PRN

## 2020-06-10 NOTE — Patient Instructions (Signed)
Community-Acquired Pneumonia, Adult Pneumonia is an infection of the lungs. It causes irritation and swelling in the airways of the lungs. Mucus and fluid may also build up inside the airways. This may cause coughing and trouble breathing. One type of pneumonia can happen while you are in a hospital. A different type can happen when you are not in a hospital (community-acquired pneumonia). What are the causes? This condition is caused by germs (viruses, bacteria, or fungi). Some types of germs can spread from person to person. Pneumonia is not thought to spread from person to person.   What increases the risk? You are more likely to develop this condition if:  You have a long-term (chronic) disease, such as: ? Disease of the lungs. This may be chronic obstructive pulmonary disease (COPD) or asthma. ? Heart failure. ? Cystic fibrosis. ? Diabetes. ? Kidney disease. ? Sickle cell disease. ? HIV.  You have other health problems, such as: ? Your body's defense system (immune system) is weak. ? A condition that may cause you to breathe in fluids from your mouth and nose.  You had your spleen taken out.  You do not take good care of your teeth and mouth (poor dental hygiene).  You use or have used tobacco products.  You travel where the germs that cause this illness are common.  You are near certain animals or the places they live.  You are older than 81 years of age. What are the signs or symptoms? Symptoms of this condition include:  A cough.  A fever.  Sweating or chills.  Chest pain, often when you breathe deeply or cough.  Breathing problems, such as: ? Fast breathing. ? Trouble breathing. ? Shortness of breath.  Feeling tired (fatigued).  Muscle aches. How is this treated? Treatment for this condition depends on many things, such as:  The cause of your illness.  Your medicines.  Your other health problems. Most adults can be treated at home. Sometimes,  treatment must happen in a hospital.  Treatment may include medicines to kill germs.  Medicines may depend on which germ caused your illness. Very bad pneumonia is rare. If you get it, you may:  Have a machine to help you breathe.  Have fluid taken away from around your lungs. Follow these instructions at home: Medicines  Take over-the-counter and prescription medicines only as told by your doctor.  Take cough medicine only if you are losing sleep. Cough medicine can keep your body from taking mucus away from your lungs.  If you were prescribed an antibiotic medicine, take it as told by your doctor. Do not stop taking the antibiotic even if you start to feel better. Lifestyle  Do not drink alcohol.  Do not use any products that contain nicotine or tobacco, such as cigarettes, e-cigarettes, and chewing tobacco. If you need help quitting, ask your doctor.  Eat a healthy diet. This includes a lot of vegetables, fruits, whole grains, low-fat dairy products, and low-fat (lean) protein.      General instructions  Rest a lot. Sleep for at least 8 hours each night.  Sleep with your head and neck raised. Put a few pillows under your head or sleep in a reclining chair.  Return to your normal activities as told by your doctor. Ask your doctor what activities are safe for you.  Drink enough fluid to keep your pee (urine) pale yellow.  If your throat is sore, rinse your mouth often with salt water. To make salt   water, dissolve -1 tsp (3-6 g) of salt in 1 cup (237 mL) of warm water.  Keep all follow-up visits as told by your doctor. This is important.   How is this prevented? You can lower your risk of pneumonia by:  Getting the pneumonia shot (vaccine). These shots have different types and schedules. Ask your doctor what works best for you. Think about getting this shot if: ? You are older than 81 years of age. ? You are 19-65 years of age and:  You are being treated for  cancer.  You have long-term lung disease.  You have other problems that affect your body's defense system. Ask your doctor if you have one of these.  Getting your flu shot every year. Ask your doctor which type of shot is best for you.  Going to the dentist as often as told.  Washing your hands often with soap and water for at least 20 seconds. If you cannot use soap and water, use hand sanitizer. Contact a doctor if:  You have a fever.  You lose sleep because your cough medicine does not help. Get help right away if:  You are short of breath and this gets worse.  You have more chest pain.  Your sickness gets worse. This is very serious if: ? You are an older adult. ? Your body's defense system is weak.  You cough up blood. These symptoms may be an emergency. Do not wait to see if the symptoms will go away. Get medical help right away. Call your local emergency services (911 in the [place] Do not drive yourself to the hospital. Summary  Pneumonia is an infection of the lungs.  Community-acquired pneumonia affects people who have not been in the hospital. Certain germs can cause this infection.  This condition may be treated with medicines that kill germs.  For very bad pneumonia, you may need a hospital stay and treatment to help with breathing. This information is not intended to replace advice given to you by your health care provider. Make sure you discuss any questions you have with your health care provider. Document Revised: 12/16/2018 Document Reviewed: 12/16/2018 Elsevier Patient Education  2021 [employer]  

## 2020-06-10 NOTE — Progress Notes (Signed)
Subjective:  Patient ID: [name], female    DOB: Oct 07, 1940  Age: 80 y.o. MRN: [id number]  CC: Cough  This visit occurred during the SARS-CoV-2 public health emergency.  Safety protocols were in place, including screening questions prior to the visit, additional usage of staff PPE, and extensive cleaning of exam room while observing appropriate contact time as indicated for disinfecting solutions.    HPI [name] presents for the complaint of a 2 day hx of cough productive of yellow phlegm with chills and SOB.  Outpatient Medications Prior to Visit  Medication Sig Dispense Refill  . Blood Glucose Monitoring Suppl (ACCU-CHEK GUIDE ME) w/Device KIT 1 Act by Does not apply route 2 (two) times daily as needed. 2 kit 0  . carvedilol (COREG) 3.125 MG tablet Take 1 tablet (3.125 mg total) by mouth 2 (two) times daily with a meal. 180 tablet 0  . diclofenac Sodium (VOLTAREN) 1 % GEL Apply 4 g topically 4 (four) times daily. 100 g 5  . glucose blood (ACCU-CHEK GUIDE) test strip Use as instructed 100 each 5  . indapamide (LOZOL) 2.5 MG tablet Take 1 tablet (2.5 mg total) by mouth daily. 90 tablet 0  . levothyroxine (SYNTHROID) 75 MCG tablet Take 1 tablet (75 mcg total) by mouth daily. 90 tablet 0  . metFORMIN (GLUCOPHAGE XR) 750 MG 24 hr tablet Take 1 tablet (750 mg total) by mouth daily with breakfast. 90 tablet 0  . Multiple Vitamins-Minerals (CENTRUM SILVER PO) Take by mouth daily.    . ONE TOUCH LANCETS MISC Use with One Touch Verio monitor as directed Twice Daily to test blood glucose levels. DX: 250.60 200 each 5  . potassium chloride SA (KLOR-CON) 20 MEQ tablet Take 1 tablet (20 mEq total) by mouth daily. 3 tablet 0  . Blood Glucose Calibration (ACCU-CHEK GUIDE CONTROL) LIQD 1 Act by In Vitro route 2 (two) times daily as needed. 1 each 3  . FLUoxetine (PROZAC) 20 MG capsule TAKE 1 CAPSULE AT BEDTIME 90 capsule 1  . losartan (COZAAR) 100 MG tablet Take 1 tablet (100 mg total) by  mouth daily. 90 tablet 1   No facility-administered medications prior to visit.    ROS Review of Systems  Constitutional: Positive for chills. Negative for diaphoresis, fatigue and fever.  HENT: Positive for sore throat. Negative for trouble swallowing.   Respiratory: Positive for cough and shortness of breath. Negative for choking, chest tightness, wheezing and stridor.   Cardiovascular: Negative for chest pain, palpitations and leg swelling.  Gastrointestinal: Negative for abdominal pain, diarrhea, nausea and vomiting.  Genitourinary: Negative.  Negative for difficulty urinating.  Musculoskeletal: Negative.   Skin: Negative.  Negative for color change and rash.  Neurological: Negative.  Negative for dizziness and weakness.  Hematological: Negative for adenopathy. Does not bruise/bleed easily.  Psychiatric/Behavioral: Negative.     Objective:  BP 126/78   Pulse 64   Temp 98.7 F (37.1 C) (Oral)   Ht _0  (1.499 m)   Wt 162 lb (73.5 kg)   SpO2 99%   BMI 32.72 kg/m   BP Readings from Last 3 Encounters:  06/10/20 126/78  05/26/20 (!) 166/92  05/26/20 138/88    Wt Readings from Last 3 Encounters:  06/10/20 162 lb (73.5 kg)  05/26/20 161 lb (73 kg)  05/26/20 161 lb 3.2 oz (73.1 kg)    Physical Exam Vitals reviewed.  Constitutional:      General: She is not in acute distress.  Appearance: She is not ill-appearing, toxic-appearing or diaphoretic.  HENT:     Nose: Nose normal.     Mouth/Throat:     Mouth: Mucous membranes are moist.  Eyes:     General: No scleral icterus.    Conjunctiva/sclera: Conjunctivae normal.  Cardiovascular:     Rate and Rhythm: Normal rate and regular rhythm.     Heart sounds: No murmur heard.   Pulmonary:     Effort: Pulmonary effort is normal.     Breath sounds: No stridor. Examination of the right-lower field reveals rhonchi. Examination of the left-lower field reveals rhonchi. Rhonchi present. No wheezing or rales.  Abdominal:      General: Abdomen is protuberant. Bowel sounds are normal. There is no distension.     Palpations: Abdomen is soft. There is no hepatomegaly, splenomegaly or mass.  Musculoskeletal:        General: Normal range of motion.     Cervical back: Normal range of motion and neck supple.     Right lower leg: No edema.     Left lower leg: No edema.  Lymphadenopathy:     Cervical: No cervical adenopathy.  Skin:    General: Skin is warm and dry.  Neurological:     General: No focal deficit present.     Mental Status: She is alert.     Lab Results  Component Value Date   WBC 9.1 01/16/2020   HGB 11.9 (L) 01/16/2020   HCT 36.2 01/16/2020   PLT 325 01/16/2020   GLUCOSE 101 (H) 01/19/2020   CHOL 166 01/19/2020   TRIG 114.0 01/19/2020   HDL 64.80 01/19/2020   LDLDIRECT 120.6 11/24/2012   LDLCALC 79 01/19/2020   ALT 18 03/08/2015   AST 19 03/08/2015   NA 135 01/19/2020   K 3.6 01/19/2020   CL 97 01/19/2020   CREATININE 1.05 01/19/2020   BUN 19 01/19/2020   CO2 24 01/19/2020   TSH 4.30 01/19/2020   INR 1.0 10/01/2011   HGBA1C 6.2 01/19/2020   MICROALBUR 0.8 06/23/2014    DG Pelvis 1-2 Views  Result Date: 01/16/2020 CLINICAL DATA:  Multiple falls.  Left hip pain EXAM: PELVIS - 1-2 VIEW COMPARISON:  None. FINDINGS: There is no evidence of pelvic fracture or diastasis. No pelvic bone lesions are seen. IMPRESSION: Negative. Electronically Signed   By: Rolm Baptise M.D.   On: 01/16/2020 17:31   CT Head Wo Contrast  Result Date: 01/16/2020 CLINICAL DATA:  Fall, facial trauma EXAM: CT HEAD WITHOUT CONTRAST TECHNIQUE: Contiguous axial images were obtained from the base of the skull through the vertex without intravenous contrast. COMPARISON:  None. FINDINGS: Brain: There is atrophy and chronic small vessel disease changes. No acute intracranial abnormality. Specifically, no hemorrhage, hydrocephalus, mass lesion, acute infarction, or significant intracranial injury. Vascular: No  hyperdense vessel or unexpected calcification. Skull: No acute calvarial abnormality. Sinuses/Orbits: Visualized paranasal sinuses and mastoids clear. Orbital soft tissues unremarkable. Other: None IMPRESSION: Atrophy, chronic microvascular disease. No acute intracranial abnormality. Electronically Signed   By: Rolm Baptise M.D.   On: 01/16/2020 18:16   CT Cervical Spine Wo Contrast  Result Date: 01/16/2020 CLINICAL DATA:  Fall, neck trauma EXAM: CT CERVICAL SPINE WITHOUT CONTRAST TECHNIQUE: Multidetector CT imaging of the cervical spine was performed without intravenous contrast. Multiplanar CT image reconstructions were also generated. COMPARISON:  None. FINDINGS: Alignment: Negative Skull base and vertebrae: No acute fracture. No primary bone lesion or focal pathologic process. Soft tissues and spinal canal: No  prevertebral fluid or swelling. No visible canal hematoma. Disc levels: Mild diffuse degenerative disc disease and facet disease. Upper chest: No acute findings Other: None IMPRESSION: No acute bony abnormality in the cervical spine. Electronically Signed   By: Rolm Baptise M.D.   On: 01/16/2020 18:18   DG Knee Complete 4 Views Left  Result Date: 01/16/2020 CLINICAL DATA:  Fall, left leg pain EXAM: LEFT KNEE - COMPLETE 4+ VIEW COMPARISON:  None. FINDINGS: No acute bony abnormality. Specifically, no fracture, subluxation, or dislocation. Joint spaces maintained. No joint effusion. Sclerotic area in the distal femur could reflect bone infarct or enchondroma. IMPRESSION: No acute bony abnormality. Electronically Signed   By: Rolm Baptise M.D.   On: 01/16/2020 17:29   DG Femur Min 2 Views Left  Result Date: 01/16/2020 CLINICAL DATA:  Multiple falls.  Left leg pain, hip pain EXAM: LEFT FEMUR 2 VIEWS COMPARISON:  12/16/2008 FINDINGS: No acute bony abnormality. Specifically, no fracture, subluxation, or dislocation. Sclerotic area in the distal femur could reflect bone infarct or enchondroma.  IMPRESSION: No acute bony abnormality. Electronically Signed   By: Rolm Baptise M.D.   On: 01/16/2020 17:31   DG Chest 2 View  Result Date: 06/10/2020 CLINICAL DATA:  Cough and shortness of breath EXAM: CHEST - 2 VIEW COMPARISON:  March 08, 2015 FINDINGS: There is slight atelectatic change in the left lower lobe. Lungs otherwise are clear. Heart is upper normal in size with pulmonary vascularity normal. No adenopathy. There is degenerative change in the thoracic spine with slight anterior wedging of a lower thoracic vertebral body. IMPRESSION: Slight left lower lung region atelectasis. Lungs otherwise clear. Heart upper normal in size. Electronically Signed   By: Lowella Grip III M.D.   On: 06/10/2020 11:57    Assessment & Plan:   Shawntrice was seen today for cough.  Diagnoses and all orders for this visit:  Cough -     DG Chest 2 View; Future  Essential hypertension -     losartan (COZAAR) 100 MG tablet; Take 1 tablet (100 mg total) by mouth daily.  Type II diabetes mellitus with manifestations (HCC) -     losartan (COZAAR) 100 MG tablet; Take 1 tablet (100 mg total) by mouth daily. -     Blood Glucose Calibration (ACCU-CHEK GUIDE CONTROL) LIQD; 1 Act by In Vitro route 2 (two) times daily as needed.  Pneumonia of left lower lobe due to infectious organism- Will offer broad spectrum coverage for bacterial causes with Omadacycline. -     promethazine-dextromethorphan (PROMETHAZINE-DM) 6.25-15 MG/5ML syrup; Take 5 mLs by mouth 4 (four) times daily as needed for up to 7 days for cough. -     Omadacycline Tosylate (NUZYRA) 150 MG TABS; Take 2 tablets by mouth daily for 7 days. -     Omadacycline Tosylate (NUZYRA) 150 MG TABS; Take 2 tablets by mouth in the morning and at bedtime for 1 day.  Recurrent major depressive disorder, in full remission ([place]) -     FLUoxetine (PROZAC) 20 MG capsule; TAKE 1 CAPSULE AT BEDTIME   I am having Toria Cirrincione start on promethazine-dextromethorphan,  [place], [place]. I am also having her maintain her Multiple Vitamins-Minerals (CENTRUM SILVER PO), ONE TOUCH LANCETS, potassium chloride SA, diclofenac Sodium, levothyroxine, indapamide, metFORMIN, Accu-Chek Guide Me, Accu-Chek Guide, carvedilol, losartan, Accu-Chek Guide Control, and FLUoxetine.  Meds ordered this encounter  Medications  . losartan (COZAAR) 100 MG tablet    Sig: Take 1 tablet (100 mg total) by  mouth daily.    Dispense:  90 tablet    Refill:  1  . Blood Glucose Calibration (ACCU-CHEK GUIDE CONTROL) LIQD    Sig: 1 Act by In Vitro route 2 (two) times daily as needed.    Dispense:  1 each    Refill:  3  . promethazine-dextromethorphan (PROMETHAZINE-DM) 6.25-15 MG/5ML syrup    Sig: Take 5 mLs by mouth 4 (four) times daily as needed for up to 7 days for cough.    Dispense:  118 mL    Refill:  0  . Omadacycline Tosylate (NUZYRA) 150 MG TABS    Sig: Take 2 tablets by mouth daily for 7 days.    Dispense:  14 tablet    Refill:  0  . Omadacycline Tosylate (NUZYRA) 150 MG TABS    Sig: Take 2 tablets by mouth in the morning and at bedtime for 1 day.    Dispense:  6 tablet    Refill:  0  . FLUoxetine (PROZAC) 20 MG capsule    Sig: TAKE 1 CAPSULE AT BEDTIME    Dispense:  90 capsule    Refill:  1     Follow-up: Return in about 3 weeks (around 07/01/2020).  Scarlette Calico, MD

## 2020-06-13 ENCOUNTER — Telehealth: Payer: Self-pay | Admitting: Internal Medicine

## 2020-06-13 NOTE — Telephone Encounter (Signed)
Patient called and said that she still has not received Omadacycline Tosylate (NUZYRA) 150 MG TABS and she said when she does receive it it will be $1,100. She said she is trying to get patient assistance and was wondering if Dr. Ronnald Ramp wanted to call something else in or wait and see if she gets the patient assistance. She can be reached at [phone]. Please advise     [facility] [id number] [place] [address] [place], [place]

## 2020-06-14 ENCOUNTER — Other Ambulatory Visit: Payer: Self-pay | Admitting: Internal Medicine

## 2020-06-14 DIAGNOSIS — J189 Pneumonia, unspecified organism: Secondary | ICD-10-CM

## 2020-06-14 MED ORDER — DOXYCYCLINE MONOHYDRATE 100 MG PO TABS: 100.0000 mg | ORAL_TABLET | Freq: Two times a day (BID) | ORAL | 0 refills | Status: AC

## 2020-06-14 NOTE — Telephone Encounter (Signed)
Change to doxy RX sent to [facility]

## 2020-06-14 NOTE — Telephone Encounter (Signed)
Pt has been informed.

## 2020-06-21 DIAGNOSIS — Z8701 Personal history of pneumonia (recurrent): Secondary | ICD-10-CM | POA: Diagnosis not present

## 2020-06-21 DIAGNOSIS — R0989 Other specified symptoms and signs involving the circulatory and respiratory systems: Secondary | ICD-10-CM | POA: Diagnosis not present

## 2020-06-23 DIAGNOSIS — F411 Generalized anxiety disorder: Secondary | ICD-10-CM | POA: Diagnosis not present

## 2020-06-23 DIAGNOSIS — F331 Major depressive disorder, recurrent, moderate: Secondary | ICD-10-CM | POA: Diagnosis not present

## 2020-06-24 DIAGNOSIS — F331 Major depressive disorder, recurrent, moderate: Secondary | ICD-10-CM | POA: Diagnosis not present

## 2020-06-24 DIAGNOSIS — F411 Generalized anxiety disorder: Secondary | ICD-10-CM | POA: Diagnosis not present

## 2020-07-05 ENCOUNTER — Other Ambulatory Visit: Payer: Self-pay

## 2020-07-05 ENCOUNTER — Ambulatory Visit (INDEPENDENT_AMBULATORY_CARE_PROVIDER_SITE_OTHER): Payer: Medicare HMO

## 2020-07-05 ENCOUNTER — Encounter: Payer: Self-pay | Admitting: Internal Medicine

## 2020-07-05 ENCOUNTER — Ambulatory Visit (INDEPENDENT_AMBULATORY_CARE_PROVIDER_SITE_OTHER): Payer: Medicare HMO | Admitting: Internal Medicine

## 2020-07-05 VITALS — BP 126/82 | HR 66 | Temp 98.1°F | Ht 59.0 in | Wt 167.0 lb

## 2020-07-05 DIAGNOSIS — I10 Essential (primary) hypertension: Secondary | ICD-10-CM | POA: Diagnosis not present

## 2020-07-05 DIAGNOSIS — R7989 Other specified abnormal findings of blood chemistry: Secondary | ICD-10-CM

## 2020-07-05 DIAGNOSIS — D539 Nutritional anemia, unspecified: Secondary | ICD-10-CM

## 2020-07-05 DIAGNOSIS — J189 Pneumonia, unspecified organism: Secondary | ICD-10-CM

## 2020-07-05 DIAGNOSIS — R0600 Dyspnea, unspecified: Secondary | ICD-10-CM | POA: Diagnosis not present

## 2020-07-05 DIAGNOSIS — R059 Cough, unspecified: Secondary | ICD-10-CM

## 2020-07-05 DIAGNOSIS — E118 Type 2 diabetes mellitus with unspecified complications: Secondary | ICD-10-CM

## 2020-07-05 DIAGNOSIS — R0609 Other forms of dyspnea: Secondary | ICD-10-CM

## 2020-07-05 DIAGNOSIS — R0602 Shortness of breath: Secondary | ICD-10-CM | POA: Diagnosis not present

## 2020-07-05 LAB — BASIC METABOLIC PANEL
BUN: 21 mg/dL (ref 6–23)
CO2: 30 mEq/L (ref 19–32)
Calcium: 9.5 mg/dL (ref 8.4–10.5)
Chloride: 104 mEq/L (ref 96–112)
Creatinine, Ser: 1.02 mg/dL (ref 0.40–1.20)
GFR: 51.75 mL/min — ABNORMAL LOW (ref >60.00)
Glucose, Bld: 80 mg/dL (ref 70–99)
Potassium: 4.7 mEq/L (ref 3.5–5.1)
Sodium: 140 mEq/L (ref 135–145)

## 2020-07-05 LAB — CBC WITH DIFFERENTIAL/PLATELET
Basophils Absolute: 0 10*3/uL (ref 0.0–0.1)
Basophils Relative: 0.6 % (ref 0.0–3.0)
Eosinophils Absolute: 0.1 10*3/uL (ref 0.0–0.7)
Eosinophils Relative: 1.4 % (ref 0.0–5.0)
HCT: 38.6 % (ref 36.0–46.0)
Hemoglobin: 12.7 g/dL (ref 12.0–15.0)
Lymphocytes Relative: 30.5 % (ref 12.0–46.0)
Lymphs Abs: 2.8 10*3/uL (ref 0.7–4.0)
MCHC: 32.8 g/dL (ref 30.0–36.0)
MCV: 93.7 fl (ref 78.0–100.0)
Monocytes Absolute: 0.8 10*3/uL (ref 0.1–1.0)
Monocytes Relative: 8.5 % (ref 3.0–12.0)
Neutro Abs: 5.3 10*3/uL (ref 1.4–7.7)
Neutrophils Relative %: 59 % (ref 43.0–77.0)
Platelets: 248 10*3/uL (ref 150.0–400.0)
RBC: 4.13 Mil/uL (ref 3.87–5.11)
RDW: 15.5 % (ref 11.5–15.5)
WBC: 9 10*3/uL (ref 4.0–10.5)

## 2020-07-05 LAB — IRON: Iron: 121 ug/dL (ref 42–145)

## 2020-07-05 LAB — FOLATE: Folate: >23.6 ng/mL (ref >5.9)

## 2020-07-05 LAB — FERRITIN: Ferritin: 22.3 ng/mL (ref 10.0–291.0)

## 2020-07-05 LAB — VITAMIN B12: Vitamin B-12: 413 pg/mL (ref 211–911)

## 2020-07-05 LAB — TROPONIN I (HIGH SENSITIVITY): High Sens Troponin I: 4 ng/L (ref 2–17)

## 2020-07-05 LAB — BRAIN NATRIURETIC PEPTIDE: Pro B Natriuretic peptide (BNP): 217 pg/mL — ABNORMAL HIGH (ref 0.0–100.0)

## 2020-07-05 NOTE — Progress Notes (Signed)
Subjective:  Patient ID: [name], female    DOB: [birth year]  Age: 80 y.o. MRN: [id number]  CC: Cough and Diabetes  This visit occurred during the SARS-CoV-2 public health emergency.  Safety protocols were in place, including screening questions prior to the visit, additional usage of staff PPE, and extensive cleaning of exam room while observing appropriate contact time as indicated for disinfecting solutions.    HPI Taylore Hermida presents for f/up -  She has completed the course of omadacycline. Her cough is much better. Since I last saw her she has developed DOE with minimal exertion and LE edema. She denies CP, palpitations, diaphoresis, or dizziness.  Outpatient Medications Prior to Visit  Medication Sig Dispense Refill  . Blood Glucose Calibration (ACCU-CHEK GUIDE CONTROL) LIQD 1 Act by In Vitro route 2 (two) times daily as needed. 1 each 3  . Blood Glucose Monitoring Suppl (ACCU-CHEK GUIDE ME) w/Device KIT 1 Act by Does not apply route 2 (two) times daily as needed. 2 kit 0  . carvedilol (COREG) 3.125 MG tablet Take 1 tablet (3.125 mg total) by mouth 2 (two) times daily with a meal. 180 tablet 0  . diclofenac Sodium (VOLTAREN) 1 % GEL Apply 4 g topically 4 (four) times daily. 100 g 5  . FLUoxetine (PROZAC) 20 MG capsule TAKE 1 CAPSULE AT BEDTIME 90 capsule 1  . glucose blood (ACCU-CHEK GUIDE) test strip Use as instructed 100 each 5  . indapamide (LOZOL) 2.5 MG tablet Take 1 tablet (2.5 mg total) by mouth daily. 90 tablet 0  . levothyroxine (SYNTHROID) 75 MCG tablet Take 1 tablet (75 mcg total) by mouth daily. 90 tablet 0  . losartan (COZAAR) 100 MG tablet Take 1 tablet (100 mg total) by mouth daily. 90 tablet 1  . metFORMIN (GLUCOPHAGE XR) 750 MG 24 hr tablet Take 1 tablet (750 mg total) by mouth daily with breakfast. 90 tablet 0  . Multiple Vitamins-Minerals (CENTRUM SILVER PO) Take by mouth daily.    . ONE TOUCH LANCETS MISC Use with One Touch Verio monitor as directed  Twice Daily to test blood glucose levels. DX: 250.60 200 each 5  . potassium chloride SA (KLOR-CON) 20 MEQ tablet Take 1 tablet (20 mEq total) by mouth daily. 3 tablet 0   No facility-administered medications prior to visit.    ROS Review of Systems  Constitutional: Positive for unexpected weight change (wt gain). Negative for appetite change, chills, diaphoresis and fatigue.  HENT: Negative.   Respiratory: Positive for cough and shortness of breath. Negative for chest tightness and wheezing.   Cardiovascular: Positive for leg swelling. Negative for chest pain and palpitations.  Gastrointestinal: Negative for abdominal pain, constipation, diarrhea, nausea and vomiting.  Endocrine: Negative.  Negative for polydipsia, polyphagia and polyuria.  Genitourinary: Negative.  Negative for difficulty urinating, dysuria and frequency.  Musculoskeletal: Positive for arthralgias. Negative for back pain and myalgias.  Skin: Negative.  Negative for color change and pallor.  Neurological: Negative.  Negative for dizziness, weakness, light-headedness, numbness and headaches.  Hematological: Negative for adenopathy. Does not bruise/bleed easily.  Psychiatric/Behavioral: Negative.     Objective:  BP 126/82   Pulse 66   Temp 98.1 F (36.7 C) (Oral)   Ht _0  (1.499 m)   Wt 167 lb (75.8 kg)   SpO2 97%   BMI 33.73 kg/m   BP Readings from Last 3 Encounters:  07/05/20 126/82  06/10/20 126/78  05/26/20 (!) 166/92    Wt Readings from Last 3  Encounters:  07/05/20 167 lb (75.8 kg)  06/10/20 162 lb (73.5 kg)  05/26/20 161 lb (73 kg)    Physical Exam Vitals reviewed.  Constitutional:      Appearance: She is obese.  HENT:     Nose: Nose normal.     Mouth/Throat:     Mouth: Mucous membranes are moist.  Eyes:     General: No scleral icterus.    Conjunctiva/sclera: Conjunctivae normal.  Cardiovascular:     Rate and Rhythm: Normal rate and regular rhythm.     Heart sounds: Murmur heard.    Systolic murmur is present with a grade of 1/6. No friction rub.     Comments: EKG-  Sinus bradycardia, 57 bpm Poor quality - wandering baseline (CMA tells me this is the best she could get) No LVH No Q waves  Pulmonary:     Effort: Pulmonary effort is normal.     Breath sounds: No stridor. No wheezing, rhonchi or rales.  Abdominal:     General: Abdomen is protuberant. Bowel sounds are normal. There is no distension.     Palpations: Abdomen is soft. There is no hepatomegaly, splenomegaly or mass.  Musculoskeletal:     Cervical back: Neck supple.     Right lower leg: 1+ Pitting Edema present.     Left lower leg: 1+ Pitting Edema present.  Lymphadenopathy:     Cervical: No cervical adenopathy.  Skin:    General: Skin is warm and dry.     Coloration: Skin is not pale.  Neurological:     General: No focal deficit present.     Mental Status: She is alert.  Psychiatric:        Mood and Affect: Mood normal.     Lab Results  Component Value Date   WBC 9.0 07/05/2020   HGB 12.7 07/05/2020   HCT 38.6 07/05/2020   PLT 248.0 07/05/2020   GLUCOSE 80 07/05/2020   CHOL 166 01/19/2020   TRIG 114.0 01/19/2020   HDL 64.80 01/19/2020   LDLDIRECT 120.6 11/24/2012   LDLCALC 79 01/19/2020   ALT 18 03/08/2015   AST 19 03/08/2015   NA 140 07/05/2020   K 4.7 07/05/2020   CL 104 07/05/2020   CREATININE 1.02 07/05/2020   BUN 21 07/05/2020   CO2 30 07/05/2020   TSH 4.30 01/19/2020   INR 1.0 10/01/2011   HGBA1C 7.0 (H) 07/06/2020   MICROALBUR 0.8 06/23/2014    DG Pelvis 1-2 Views  Result Date: 01/16/2020 CLINICAL DATA:  Multiple falls.  Left hip pain EXAM: PELVIS - 1-2 VIEW COMPARISON:  None. FINDINGS: There is no evidence of pelvic fracture or diastasis. No pelvic bone lesions are seen. IMPRESSION: Negative. Electronically Signed   By: Rolm Baptise M.D.   On: 01/16/2020 17:31   CT Head Wo Contrast  Result Date: 01/16/2020 CLINICAL DATA:  Fall, facial trauma EXAM: CT HEAD WITHOUT  CONTRAST TECHNIQUE: Contiguous axial images were obtained from the base of the skull through the vertex without intravenous contrast. COMPARISON:  None. FINDINGS: Brain: There is atrophy and chronic small vessel disease changes. No acute intracranial abnormality. Specifically, no hemorrhage, hydrocephalus, mass lesion, acute infarction, or significant intracranial injury. Vascular: No hyperdense vessel or unexpected calcification. Skull: No acute calvarial abnormality. Sinuses/Orbits: Visualized paranasal sinuses and mastoids clear. Orbital soft tissues unremarkable. Other: None IMPRESSION: Atrophy, chronic microvascular disease. No acute intracranial abnormality. Electronically Signed   By: Rolm Baptise M.D.   On: 01/16/2020 18:16   CT Cervical  Spine Wo Contrast  Result Date: 01/16/2020 CLINICAL DATA:  Fall, neck trauma EXAM: CT CERVICAL SPINE WITHOUT CONTRAST TECHNIQUE: Multidetector CT imaging of the cervical spine was performed without intravenous contrast. Multiplanar CT image reconstructions were also generated. COMPARISON:  None. FINDINGS: Alignment: Negative Skull base and vertebrae: No acute fracture. No primary bone lesion or focal pathologic process. Soft tissues and spinal canal: No prevertebral fluid or swelling. No visible canal hematoma. Disc levels: Mild diffuse degenerative disc disease and facet disease. Upper chest: No acute findings Other: None IMPRESSION: No acute bony abnormality in the cervical spine. Electronically Signed   By: Rolm Baptise M.D.   On: 01/16/2020 18:18   DG Knee Complete 4 Views Left  Result Date: 01/16/2020 CLINICAL DATA:  Fall, left leg pain EXAM: LEFT KNEE - COMPLETE 4+ VIEW COMPARISON:  None. FINDINGS: No acute bony abnormality. Specifically, no fracture, subluxation, or dislocation. Joint spaces maintained. No joint effusion. Sclerotic area in the distal femur could reflect bone infarct or enchondroma. IMPRESSION: No acute bony abnormality. Electronically Signed    By: Rolm Baptise M.D.   On: 01/16/2020 17:29   DG Femur Min 2 Views Left  Result Date: 01/16/2020 CLINICAL DATA:  Multiple falls.  Left leg pain, hip pain EXAM: LEFT FEMUR 2 VIEWS COMPARISON:  12/16/2008 FINDINGS: No acute bony abnormality. Specifically, no fracture, subluxation, or dislocation. Sclerotic area in the distal femur could reflect bone infarct or enchondroma. IMPRESSION: No acute bony abnormality. Electronically Signed   By: Rolm Baptise M.D.   On: 01/16/2020 17:31    No results found.  Assessment & Plan:   Jenness was seen today for cough and diabetes.  Diagnoses and all orders for this visit:  Pneumonia of left lower lobe due to infectious organism- This has resolved. -     DG Chest 2 View; Future  Cough- Her CXR is normal now. -     DG Chest 2 View; Future  DOE (dyspnea on exertion)- Her BNP is mildly elevated. I recommend that she see cardiology. -     CBC with Differential/Platelet; Future -     Basic metabolic panel; Future -     Troponin I (High Sensitivity); Future -     Brain natriuretic peptide; Future -     EKG 12-Lead -     Brain natriuretic peptide -     Troponin I (High Sensitivity) -     Basic metabolic panel -     CBC with Differential/Platelet -     Ambulatory referral to Cardiology  Essential hypertension- Her BP is well controlled. -     CBC with Differential/Platelet; Future -     Basic metabolic panel; Future -     Basic metabolic panel -     CBC with Differential/Platelet  Deficiency anemia- Her H/H are normal now. -     Vitamin B12; Future -     Iron; Future -     Vitamin B1; Future -     Folate; Future -     Ferritin; Future -     Ferritin -     Folate -     Vitamin B1 -     Iron -     Vitamin B12  Type II diabetes mellitus with manifestations ([place])- Her blood sugar is adequately well controlled. -     Hemoglobin A1c; Future -     Hemoglobin A1c -     Hemoglobin A1c; Future -     Hemoglobin A1c  Elevated brain natriuretic  peptide (BNP) level -     Ambulatory referral to Cardiology   I am having Priseis Schissler maintain her Multiple Vitamins-Minerals (CENTRUM SILVER PO), ONE TOUCH LANCETS, potassium chloride SA, diclofenac Sodium, levothyroxine, indapamide, metFORMIN, Accu-Chek Guide Me, Accu-Chek Guide, carvedilol, losartan, Accu-Chek Guide Control, and FLUoxetine.  No orders of the defined types were placed in this encounter.    Follow-up: No follow-ups on file.  Scarlette Calico, MD

## 2020-07-06 ENCOUNTER — Encounter: Payer: Self-pay | Admitting: Internal Medicine

## 2020-07-06 DIAGNOSIS — R7989 Other specified abnormal findings of blood chemistry: Secondary | ICD-10-CM | POA: Insufficient documentation

## 2020-07-06 LAB — HEMOGLOBIN A1C: Hgb A1c MFr Bld: 7 % — ABNORMAL HIGH (ref 4.6–6.5)

## 2020-07-06 NOTE — Patient Instructions (Signed)
Type 2 Diabetes Mellitus, Diagnosis, Adult Type 2 diabetes (type 2 diabetes mellitus) is a long-term, or chronic, disease. In type 2 diabetes, one or both of these problems may be present:  The pancreas does not make enough of a hormone called insulin.  Cells in the body do not respond properly to insulin that the body makes (insulin resistance). Normally, insulin allows blood sugar (glucose) to enter cells in the body. The cells use glucose for energy. Insulin resistance or lack of insulin causes excess glucose to build up in the blood instead of going into cells. This causes high blood glucose (hyperglycemia).  What are the causes? The exact cause of type 2 diabetes is not known. What increases the risk? The following factors may make you more likely to develop this condition:  Having a family member with type 2 diabetes.  Being overweight or obese.  Being inactive (sedentary).  Having been diagnosed with insulin resistance.  Having a history of prediabetes, diabetes when you were pregnant (gestational diabetes), or polycystic ovary syndrome (PCOS). What are the signs or symptoms? In the early stage of this condition, you may not have symptoms. Symptoms develop slowly and may include:  Increased thirst or hunger.  Increased urination.  Unexplained weight loss.  Tiredness (fatigue) or weakness.  Vision changes, such as blurry vision.  Dark patches on the skin. How is this diagnosed? This condition is diagnosed based on your symptoms, your medical history, a physical exam, and your blood glucose level. Your blood glucose may be checked with one or more of the following blood tests:  A fasting blood glucose (FBG) test. You will not be allowed to eat (you will fast) for 8 hours or longer before a blood sample is taken.  A random blood glucose test. This test checks blood glucose at any time of day regardless of when you ate.  An A1C (hemoglobin A1C) blood test. This test  provides information about blood glucose levels over the previous 2-3 months.  An oral glucose tolerance test (OGTT). This test measures your blood glucose at two times: ? After fasting. This is your baseline blood glucose level. ? Two hours after drinking a beverage that contains glucose. You may be diagnosed with type 2 diabetes if:  Your fasting blood glucose level is 126 mg/dL (7.0 mmol/L) or higher.  Your random blood glucose level is 200 mg/dL (11.1 mmol/L) or higher.  Your A1C level is 6.5% or higher.  Your oral glucose tolerance test result is higher than 200 mg/dL (11.1 mmol/L). These blood tests may be repeated to confirm your diagnosis.   How is this treated? Your treatment may be managed by a specialist called an endocrinologist. Type 2 diabetes may be treated by following instructions from your health care provider about:  Making dietary and lifestyle changes. These may include: ? Following a personalized nutrition plan that is developed by a registered dietitian. ? Exercising regularly. ? Finding ways to manage stress.  Checking your blood glucose level as often as told.  Taking diabetes medicines or insulin daily. This helps to keep your blood glucose levels in the healthy range.  Taking medicines to help prevent complications from diabetes. Medicines may include: ? Aspirin. ? Medicine to lower cholesterol. ? Medicine to control blood pressure. Your health care provider will set treatment goals for you. Your goals will be based on your age, other medical conditions you have, and how you respond to diabetes treatment. Generally, the goal of treatment is to maintain the   following blood glucose levels:  Before meals: 80-130 mg/dL (4.4-7.2 mmol/L).  After meals: below 180 mg/dL (10 mmol/L).  A1C level: less than 7%. Follow these instructions at home: Questions to ask your health care provider Consider asking the following questions:  Should I meet with a certified  diabetes care and education specialist?  What diabetes medicines do I need, and when should I take them?  What equipment will I need to manage my diabetes at home?  How often do I need to check my blood glucose?  Where can I find a support group for people with diabetes?  What number can I call if I have questions?  When is my next appointment? General instructions  Take over-the-counter and prescription medicines only as told by your health care provider.  Keep all follow-up visits as told by your health care provider. This is important. Where to find more information  American Diabetes Association (ADA): www.diabetes.org  American Association of Diabetes Care and Education Specialists (ADCES): www.diabeteseducator.org  International Diabetes Federation (IDF): www.idf.org Contact a health care provider if:  Your blood glucose is at or above 240 mg/dL (13.3 mmol/L) for 2 days in a row.  You have been sick or have had a fever for 2 days or longer, and you are not getting better.  You have any of the following problems for more than 6 hours: ? You cannot eat or drink. ? You have nausea and vomiting. ? You have diarrhea. Get help right away if:  You have severe hypoglycemia. This means your blood glucose is lower than 54 mg/dL (3.0 mmol/L).  You become confused or you have trouble thinking clearly.  You have difficulty breathing.  You have moderate or large ketone levels in your urine. These symptoms may represent a serious problem that is an emergency. Do not wait to see if the symptoms will go away. Get medical help right away. Call your local emergency services (911 in the [place] Do not drive yourself to the hospital. Summary  Type 2 diabetes (type 2 diabetes mellitus) is a long-term, or chronic, disease. In type 2 diabetes, the pancreas does not make enough of a hormone called insulin, or cells in the body do not respond properly to insulin that the body makes (insulin  resistance).  This condition is treated by making dietary and lifestyle changes and taking diabetes medicines or insulin.  Your health care provider will set treatment goals for you. Your goals will be based on your age, other medical conditions you have, and how you respond to diabetes treatment.  Keep all follow-up visits as told by your health care provider. This is important. This information is not intended to replace advice given to you by your health care provider. Make sure you discuss any questions you have with your health care provider. Document Revised: 09/30/2019 Document Reviewed: 09/30/2019 Elsevier Patient Education  2021 Elsevier Inc.  

## 2020-07-07 DIAGNOSIS — H903 Sensorineural hearing loss, bilateral: Secondary | ICD-10-CM | POA: Diagnosis not present

## 2020-07-08 DIAGNOSIS — M6281 Muscle weakness (generalized): Secondary | ICD-10-CM | POA: Diagnosis not present

## 2020-07-08 DIAGNOSIS — G4733 Obstructive sleep apnea (adult) (pediatric): Secondary | ICD-10-CM | POA: Diagnosis not present

## 2020-07-10 LAB — VITAMIN B1: Vitamin B1 (Thiamine): 61 nmol/L — ABNORMAL HIGH (ref 8–30)

## 2020-07-19 DIAGNOSIS — F331 Major depressive disorder, recurrent, moderate: Secondary | ICD-10-CM | POA: Diagnosis not present

## 2020-07-19 DIAGNOSIS — F411 Generalized anxiety disorder: Secondary | ICD-10-CM | POA: Diagnosis not present

## 2020-07-22 DIAGNOSIS — F411 Generalized anxiety disorder: Secondary | ICD-10-CM | POA: Diagnosis not present

## 2020-07-22 DIAGNOSIS — F331 Major depressive disorder, recurrent, moderate: Secondary | ICD-10-CM | POA: Diagnosis not present

## 2020-07-29 ENCOUNTER — Ambulatory Visit: Payer: Medicare HMO | Admitting: Cardiovascular Disease

## 2020-07-29 ENCOUNTER — Encounter: Payer: Self-pay | Admitting: Interventional Cardiology

## 2020-07-29 ENCOUNTER — Ambulatory Visit: Payer: Medicare HMO | Admitting: Interventional Cardiology

## 2020-07-29 ENCOUNTER — Other Ambulatory Visit: Payer: Self-pay

## 2020-07-29 VITALS — BP 124/76 | HR 73 | Ht 59.0 in | Wt 163.2 lb

## 2020-07-29 DIAGNOSIS — M79604 Pain in right leg: Secondary | ICD-10-CM

## 2020-07-29 DIAGNOSIS — R0600 Dyspnea, unspecified: Secondary | ICD-10-CM | POA: Diagnosis not present

## 2020-07-29 DIAGNOSIS — E119 Type 2 diabetes mellitus without complications: Secondary | ICD-10-CM

## 2020-07-29 DIAGNOSIS — R0609 Other forms of dyspnea: Secondary | ICD-10-CM

## 2020-07-29 DIAGNOSIS — I10 Essential (primary) hypertension: Secondary | ICD-10-CM

## 2020-07-29 DIAGNOSIS — M79605 Pain in left leg: Secondary | ICD-10-CM | POA: Diagnosis not present

## 2020-07-29 MED ORDER — ROSUVASTATIN CALCIUM 10 MG PO TABS: 10.0000 mg | ORAL_TABLET | Freq: Every day | ORAL | 3 refills | Status: DC

## 2020-07-29 NOTE — Patient Instructions (Addendum)
Medication Instructions:  NO CHANGES  *If you need a refill on your cardiac medications before your next appointment, please call your pharmacy*   Lab Work: NONE If you have labs (blood work) drawn today and your tests are completely normal, you will receive your results only by: Marland Kitchen MyChart Message (if you have MyChart) OR . A paper copy in the mail If you have any lab test that is abnormal or we need to change your treatment, we will call you to review the results.   Testing/Procedures: Your physician has requested that you have an echocardiogram. Echocardiography is a painless test that uses sound waves to create images of your heart. It provides your doctor with information about the size and shape of your heart and how well your heart's chambers and valves are working. This procedure takes approximately one hour. There are no restrictions for this procedure.     Follow-Up: At [facility], you and your health needs are our priority.  As part of our continuing mission to provide you with exceptional heart care, we have created designated Provider Care Teams.  These Care Teams include your primary Cardiologist (physician) and Advanced Practice Providers (APPs -  Physician Assistants and Nurse Practitioners) who all work together to provide you with the care you need, when you need it.  We recommend signing up for the patient portal called "MyChart".  Sign up information is provided on this After Visit Summary.  MyChart is used to connect with patients for Virtual Visits (Telemedicine).  Patients are able to view lab/test results, encounter notes, upcoming appointments, etc.  Non-urgent messages can be sent to your provider as well.   To learn more about what you can do with MyChart, go to [email or web address].    RETURN AS NEEDED

## 2020-07-29 NOTE — Progress Notes (Signed)
Cardiology Office Note   Date:  07/29/2020   ID:  [name], DOB 18-Oct-1940, MRN [id number]  PCP:  Janith Lima, MD    No chief complaint on file.  DOE  [facility] Readings from Last 3 Encounters:  07/29/20 163 lb 3.2 oz (74 kg)  07/05/20 167 lb (75.8 kg)  06/10/20 162 lb (73.5 kg)       History of Present Illness: [name] is a 80 y.o. female who is being seen today for the evaluation of shortness of breath at the request of Janith Lima, MD.  Echo in 2015: "Left ventricle: The cavity size was normal. Systolic function was  normal. The estimated ejection fraction was in the range of 55%  to 60%. Wall motion was normal; there were no regional wall  motion abnormalities.  - Mitral valve: There was trivial regurgitation. "  She was travelling from [place] and developed some SHOB and a cough.  SHe saw Dr. Ronnald Ramp who did a w/u and sent her here for f/u.  She had pneumonia.  She gets shots in her knees from Dr. Georgina Snell.    Her biggest complaint is leg pain.  She describes it as starting in her knees going all the way down to the soles of her feet.  Her diabetes control has been quite good for many years.  Her A1c was around 6.5.  More recently it is increased to 7.  Denies : Chest pain. Dizziness.  Nitroglycerin use. Orthopnea. Palpitations. Paroxysmal nocturnal dyspnea.  Syncope.   Past Medical History:  Diagnosis Date  . Allergic rhinitis   . Anxiety   . Arthritis   . Chronic cough    Dr Melvyn Novas, onset 11/2007, Sinus CT July 7,2010>neg, Resolved 10/2008 on ppi two times a day  . Colon polyp   . Depression   . Diabetes mellitus type II    diet  . Diverticulosis   . Dyslipidemia   . GERD (gastroesophageal reflux disease)   . Hiatal hernia   . Hypertension   . Hypothyroidism   . Lung nodule   . Obesity (BMI 30-39.9)   . Pneumonia   . Sleep apnea    Dr Gwenette Greet CPAP 2009    Past Surgical History:  Procedure Laterality Date  . ABDOMINAL  HYSTERECTOMY    . APPENDECTOMY    . BLADDER SUSPENSION    . CHOLECYSTECTOMY     1998 in [place]  . EYE SURGERY  10/13   and 12/13; cataract surgery  . Left breast lumpectomy    . NISSEN FUNDOPLICATION     March '11 Dr Johney Maine  . RECTOCELE REPAIR    . TONSILLECTOMY     x 2  . VENTRAL HERNIA REPAIR       Current Outpatient Medications  Medication Sig Dispense Refill  . Blood Glucose Calibration (ACCU-CHEK GUIDE CONTROL) LIQD 1 Act by In Vitro route 2 (two) times daily as needed. 1 each 3  . Blood Glucose Monitoring Suppl (ACCU-CHEK GUIDE ME) w/Device KIT 1 Act by Does not apply route 2 (two) times daily as needed. 2 kit 0  . carvedilol (COREG) 3.125 MG tablet Take 1 tablet (3.125 mg total) by mouth 2 (two) times daily with a meal. 180 tablet 0  . diclofenac Sodium (VOLTAREN) 1 % GEL Apply 4 g topically 4 (four) times daily. 100 g 5  . diltiazem (TIAZAC) 180 MG 24 hr capsule     . FLUoxetine (PROZAC) 20 MG capsule TAKE 1 CAPSULE  AT BEDTIME 90 capsule 1  . glucose blood (ACCU-CHEK GUIDE) test strip Use as instructed 100 each 5  . indapamide (LOZOL) 2.5 MG tablet Take 1 tablet (2.5 mg total) by mouth daily. 90 tablet 0  . levothyroxine (SYNTHROID) 75 MCG tablet Take 1 tablet (75 mcg total) by mouth daily. 90 tablet 0  . losartan (COZAAR) 100 MG tablet Take 1 tablet (100 mg total) by mouth daily. 90 tablet 1  . metFORMIN (GLUCOPHAGE XR) 750 MG 24 hr tablet Take 1 tablet (750 mg total) by mouth daily with breakfast. 90 tablet 0  . Multiple Vitamins-Minerals (CENTRUM SILVER PO) Take by mouth daily.    . ONE TOUCH LANCETS MISC Use with One Touch Verio monitor as directed Twice Daily to test blood glucose levels. DX: 250.60 200 each 5  . potassium chloride SA (KLOR-CON) 20 MEQ tablet Take 1 tablet (20 mEq total) by mouth daily. 3 tablet 0  . rosuvastatin (CRESTOR) 10 MG tablet     . triamcinolone cream (KENALOG) 0.5 %      No current facility-administered medications for this visit.     Allergies:   Amoxicillin, Codeine sulfate, Hydrochlorothiazide, Iodine, Iohexol, Paroxetine, Penicillins, and Prednisone    Social History:  The patient  reports that she quit smoking about 38 years ago. Her smoking use included cigarettes. She has a 37.50 pack-year smoking history. She has never used smokeless tobacco. She reports that she does not drink alcohol and does not use drugs.   Family History:  The patient's *family history includes Lung cancer in her father; Stroke (age of onset: 13) in her mother.    ROS:  Please see the history of present illness.   Otherwise, review of systems are positive for leg pain.   All other systems are reviewed and negative.    PHYSICAL EXAM: VS:  BP 124/76   Pulse 73   Ht _0  (1.499 m)   Wt 163 lb 3.2 oz (74 kg)   SpO2 99%   BMI 32.96 kg/m  , BMI Body mass index is 32.96 kg/m. GEN: Well nourished, well developed, in no acute distress  HEENT: normal  Neck: no JVD, carotid bruits, or masses Cardiac: RRR; no murmurs, rubs, or gallops,no edema  Respiratory:  clear to auscultation bilaterally, normal work of breathing GI: soft, nontender, nondistended, + BS MS: no deformity or atrophy  Skin: warm and dry, no rash Neuro:  Strength and sensation are intact Psych: euthymic mood, full affect   EKG:   The ekg ordered today demonstrates    Recent Labs: 01/19/2020: Magnesium 1.9; TSH 4.30 07/05/2020: BUN 21; Creatinine, Ser 1.02; Hemoglobin 12.7; Platelets 248.0; Potassium 4.7; Pro B Natriuretic peptide (BNP) 217.0; Sodium 140   Lipid Panel    Component Value Date/Time   CHOL 166 01/19/2020 1515   TRIG 114.0 01/19/2020 1515   HDL 64.80 01/19/2020 1515   CHOLHDL 3 01/19/2020 1515   VLDL 22.8 01/19/2020 1515   LDLCALC 79 01/19/2020 1515   LDLDIRECT 120.6 11/24/2012 1710     Other studies Reviewed: Additional studies/ records that were reviewed today with results demonstrating: Mildly elevated BNP, labs reviewed, 2015  echocardiogram reviewed.   ASSESSMENT AND PLAN:  1. DOE: Increased BNP.  Check echo.  I suspect she has some degree of diastolic dysfunction.  This is age-related.  She is taking it diuretic chronically.  She appears euvolemic on exam today.  The dose of her diuretic may need to be adjusted depending on her volume  status.  I do want to confirm that her systolic function and valvular function are normal, as they were in 2015. 2. DM: A1C 7.0 in 2022.  Okay to continue with regular exercise to help manage her diabetes. 3. Hypertension: The current medical regimen is effective;  continue present plan and medications. 4. Knee pain: Likely received cortisone shots.  She was unsure of what the medication was.  Her knee and leg pain are her primary concern.  I will defer to her primary care doctor, Dr. Ronnald Ramp and to Dr. Georgina Snell regarding further management.   Current medicines are reviewed at length with the patient today.  The patient concerns regarding her medicines were addressed.  The following changes have been made:  No change  Labs/ tests ordered today include: Echocardiogram No orders of the defined types were placed in this encounter.   Recommend 150 minutes/week of aerobic exercise Low fat, low carb, high fiber diet recommended  Disposition:   FU for echocardiogram   Signed, Larae Grooms, MD  07/29/2020 12:28 PM    [facility] [address], [place], [place]  [address] Phone: [phone]; Fax: [phone]

## 2020-07-30 ENCOUNTER — Other Ambulatory Visit: Payer: Self-pay | Admitting: Internal Medicine

## 2020-07-30 DIAGNOSIS — E118 Type 2 diabetes mellitus with unspecified complications: Secondary | ICD-10-CM

## 2020-08-04 NOTE — Progress Notes (Deleted)
   I, Wendy Poet, LAT, ATC, am serving as scribe for Dr. [name].  [name] is a 80 y.o. female who presents to [place] at [facility] today for leg pain.  She was last seen by Dr. Georgina Snell on 05/26/20 for L shoulder pain.  Since her last visit, pt reports leg pain x .  She locates her pain to .  Leg swelling: LE numbness/tingling: Aggravating factors: Treatments tried:   Pertinent review of systems: ***  Relevant historical information: ***   Exam:  There were no vitals taken for this visit. General: Well Developed, well nourished, and in no acute distress.   MSK: ***    Lab and Radiology Results No results found for this or any previous visit (from the past 72 hour(s)). No results found.     Assessment and Plan: 80 y.o. female with ***   PDMP not reviewed this encounter. No orders of the defined types were placed in this encounter.  No orders of the defined types were placed in this encounter.    Discussed warning signs or symptoms. Please see discharge instructions. Patient expresses understanding.   ***

## 2020-08-05 ENCOUNTER — Ambulatory Visit: Payer: Medicare HMO | Admitting: Family Medicine

## 2020-08-07 DIAGNOSIS — M6281 Muscle weakness (generalized): Secondary | ICD-10-CM | POA: Diagnosis not present

## 2020-08-07 DIAGNOSIS — G4733 Obstructive sleep apnea (adult) (pediatric): Secondary | ICD-10-CM | POA: Diagnosis not present

## 2020-08-08 ENCOUNTER — Ambulatory Visit: Payer: Medicare HMO | Admitting: Family Medicine

## 2020-08-08 ENCOUNTER — Ambulatory Visit: Payer: Self-pay

## 2020-08-08 ENCOUNTER — Telehealth: Payer: Self-pay | Admitting: Internal Medicine

## 2020-08-08 ENCOUNTER — Other Ambulatory Visit: Payer: Self-pay

## 2020-08-08 ENCOUNTER — Ambulatory Visit (INDEPENDENT_AMBULATORY_CARE_PROVIDER_SITE_OTHER): Payer: Medicare HMO

## 2020-08-08 VITALS — BP 110/76 | HR 62 | Ht 59.0 in | Wt 161.8 lb

## 2020-08-08 DIAGNOSIS — M25562 Pain in left knee: Secondary | ICD-10-CM

## 2020-08-08 DIAGNOSIS — M25561 Pain in right knee: Secondary | ICD-10-CM | POA: Diagnosis not present

## 2020-08-08 DIAGNOSIS — F331 Major depressive disorder, recurrent, moderate: Secondary | ICD-10-CM | POA: Diagnosis not present

## 2020-08-08 DIAGNOSIS — G8929 Other chronic pain: Secondary | ICD-10-CM

## 2020-08-08 DIAGNOSIS — M1711 Unilateral primary osteoarthritis, right knee: Secondary | ICD-10-CM | POA: Diagnosis not present

## 2020-08-08 DIAGNOSIS — M5416 Radiculopathy, lumbar region: Secondary | ICD-10-CM

## 2020-08-08 DIAGNOSIS — F411 Generalized anxiety disorder: Secondary | ICD-10-CM | POA: Diagnosis not present

## 2020-08-08 DIAGNOSIS — Z03818 Encounter for observation for suspected exposure to other biological agents ruled out: Secondary | ICD-10-CM | POA: Diagnosis not present

## 2020-08-08 DIAGNOSIS — M4726 Other spondylosis with radiculopathy, lumbar region: Secondary | ICD-10-CM | POA: Diagnosis not present

## 2020-08-08 MED ORDER — GABAPENTIN 100 MG PO CAPS: 100.0000 mg | ORAL_CAPSULE | Freq: Every evening | ORAL | 3 refills | Status: DC | PRN

## 2020-08-08 NOTE — Patient Instructions (Addendum)
Thank you for coming in today.  Try the gabapentin at bedtime.   Please get an Xray today before you leave  Recheck in 1 month.   Continue the diclofenac gel.

## 2020-08-08 NOTE — Progress Notes (Signed)
I, [name], LAT, ATC acting as a scribe for Gina Leader, MD.  [name] is a 80 y.o. female who presents to [place] at [facility] today for bilat leg pain, R>L. Pt was last seen by Dr. Georgina Mathews on 05/26/20 for L shoulder pain and 05/11/20 for chronic bilat knee pain. Today, pt locates pain to bilat knee w/ radiating pain all over thigh. Pt c/o pain waking her in the middle of the night. Pt reports sometimes pain will run proximally to low back.  Dx imaging: 01/16/20 L knee XR  12/16/08 R & L knee XR  Pertinent review of systems: No fevers or chills  Relevant historical information: Mild intolerance to steroids.  She can take them but it causes jitteriness and obnoxious side effects and hyperglycemia.  She notes that if she needs to take steroid she should be given low doses for short periods of time. Diabetes.  CKD.   Exam:  BP 110/76 (BP Location: Right Arm, Patient Position: Sitting, Cuff Size: Normal)   Pulse 62   Ht 4\' 11"  (1.499 m)   Wt 161 lb 12.8 oz (73.4 kg)   SpO2 99%   BMI 32.68 kg/m  General: Well Developed, well nourished, and in no acute distress.   MSK: L-spine normal-appearing nontender.  Decreased lumbar motion.  Mildly positive right-sided slump test. Lower extremity strength is intact. Reflexes are intact. Right knee normal-appearing nontender normal motion with crepitation.     Lab and Radiology Results  X-ray images lumbar spine and right knee obtained today personally and independently interpreted  Lumbar spine: DDD L5-S1.  No acute fractures.  Right knee: Mild DJD.  No acute fractures.    Assessment and Plan: 80 y.o. female with right leg pain concerning for L4 radiculopathy.  It is also possible that her pain is multifactorial right knee referring to right lower leg.  Discussed options.  Plan for trial of gabapentin at bedtime and recheck in about a month.  Ideally would be using steroids in this situation but based on  her experience with them previously will hold off for now.  Recheck in the near future.  PDMP not reviewed this encounter. Orders Placed This Encounter  Procedures  . DG Knee AP/LAT W/Sunrise Right    Standing Status:   Future    Number of Occurrences:   1    Standing Expiration Date:   08/08/2021    Order Specific Question:   Reason for Exam (SYMPTOM  OR DIAGNOSIS REQUIRED)    Answer:   knee pain r    Order Specific Question:   Preferred imaging location?    Answer:   [facility]  . DG Lumbar Spine 2-3 Views    Standing Status:   Future    Number of Occurrences:   1    Standing Expiration Date:   08/08/2021    Order Specific Question:   Reason for Exam (SYMPTOM  OR DIAGNOSIS REQUIRED)    Answer:   eval poss rt L4 radiculopathy    Order Specific Question:   Preferred imaging location?    Answer:   [facility]   Meds ordered this encounter  Medications  . gabapentin (NEURONTIN) 100 MG capsule    Sig: Take 1-2 capsules (100-200 mg total) by mouth at bedtime as needed.    Dispense:  60 capsule    Refill:  3     Discussed warning signs or symptoms. Please see discharge instructions. Patient expresses understanding.  The above documentation has been reviewed and is accurate and complete [name], M.D.

## 2020-08-08 NOTE — Telephone Encounter (Signed)
Form completed and given to pt.

## 2020-08-08 NOTE — Telephone Encounter (Signed)
Patient said that she sent a message to Dr. Ronnald Ramp about a handicap placard. She is here for an appointment downstairs. She can be reached at [phone]

## 2020-08-10 ENCOUNTER — Telehealth: Payer: Self-pay | Admitting: Internal Medicine

## 2020-08-10 NOTE — Progress Notes (Signed)
Right knee x-ray shows mild arthritis changes

## 2020-08-10 NOTE — Telephone Encounter (Signed)
Patient called and said that she tested positive for Covid 19 on 08-08-20. She wanted to know what she should be doing. She said that she does not have any symptoms. She is requesting a call back at [phone]. Please advise

## 2020-08-10 NOTE — Progress Notes (Signed)
Lumbar spine x-ray shows mild arthritis changes.

## 2020-08-11 ENCOUNTER — Other Ambulatory Visit: Payer: Self-pay | Admitting: Internal Medicine

## 2020-08-11 DIAGNOSIS — U071 COVID-19: Secondary | ICD-10-CM | POA: Insufficient documentation

## 2020-08-12 ENCOUNTER — Telehealth: Payer: Self-pay

## 2020-08-12 NOTE — Telephone Encounter (Signed)
Called to discuss with patient about COVID-19 symptoms and the use of one of the available treatments for those with mild to moderate Covid symptoms and at a high risk of hospitalization.  Pt appears to qualify for outpatient treatment due to co-morbid conditions and/or a member of an at-risk group in accordance with the FDA Emergency Use Authorization.    Symptom onset: No symptoms Vaccinated: Yes Booster? Yes Immunocompromised? No Qualifiers: DM,HTN.Obesity NIH Criteria: Tier 3  No symptoms.   [name]

## 2020-08-16 ENCOUNTER — Other Ambulatory Visit: Payer: Self-pay | Admitting: Internal Medicine

## 2020-08-16 DIAGNOSIS — E038 Other specified hypothyroidism: Secondary | ICD-10-CM

## 2020-08-17 ENCOUNTER — Other Ambulatory Visit: Payer: Self-pay | Admitting: Physician Assistant

## 2020-08-17 NOTE — Telephone Encounter (Signed)
Patient called hotline and left VM requesting a callback. I left her VM and mychart message   KT

## 2020-08-17 NOTE — Progress Notes (Signed)
Called to discuss with [name] about Covid symptoms and the use of bebtelivomab, remdisivir or oral therapies for those with mild to moderate Covid symptoms and at a high risk of hospitalization.     Pt does not qualify as pt's symptoms first presented > 5 days so out of window for oral therapy and would be >7 days from symptom onset by the time we have infusion availability. Symptoms tier reviewed as well as criteria for ending isolation. Preventative practices reviewed. Patient verbalized understanding  Pt called back saying she has had shortness of breath since march when she had PNA. She also recently tested + for covid on 5/23 due to an outbreak at her SNF. We discussed how treatment would likely be un beneficial at this time and she was thankful for the call.    Patient Active Problem List   Diagnosis Date Noted  . COVID-19 virus detected 08/11/2020  . Elevated brain natriuretic peptide (BNP) level 07/06/2020  . Cough 07/05/2020  . DOE (dyspnea on exertion) 07/05/2020  . Pneumonia of left lower lobe due to infectious organism 06/10/2020  . Recurrent major depressive disorder, in full remission ([place]) 05/26/2020  . Sensorineural hearing loss (SNHL) of both ears 05/26/2020  . Routine general medical examination at a health care facility 01/22/2020  . Stage 3b chronic kidney disease ([place]) 01/20/2020  . Acromioclavicular arthrosis 12/30/2014  . Uncomplicated type 2 diabetes mellitus ([place]) 11/29/2014  . GAD (generalized anxiety disorder) 08/19/2014  . Hyperlipidemia with target LDL less than 100 06/25/2013  . Deficiency anemia 10/24/2010  . GERD 12/03/2008  . OBSTRUCTIVE SLEEP APNEA 05/22/2007  . Type II diabetes mellitus with manifestations ([place]) 02/19/2007  . Allergic rhinitis 02/19/2007  . Hypothyroidism 12/21/2006  . Essential hypertension 12/21/2006    Angelena Form PA-C

## 2020-08-26 ENCOUNTER — Ambulatory Visit (HOSPITAL_COMMUNITY): Payer: Medicare HMO | Attending: Cardiology

## 2020-08-26 ENCOUNTER — Other Ambulatory Visit: Payer: Self-pay

## 2020-08-26 DIAGNOSIS — R0600 Dyspnea, unspecified: Secondary | ICD-10-CM | POA: Diagnosis not present

## 2020-08-26 DIAGNOSIS — R0609 Other forms of dyspnea: Secondary | ICD-10-CM

## 2020-08-26 LAB — ECHOCARDIOGRAM COMPLETE
Area-P 1/2: 3.93 cm2
S' Lateral: 1.5 cm

## 2020-08-31 ENCOUNTER — Ambulatory Visit: Payer: Medicare HMO | Admitting: Family Medicine

## 2020-08-31 ENCOUNTER — Ambulatory Visit: Payer: Medicare HMO | Admitting: Internal Medicine

## 2020-09-01 ENCOUNTER — Ambulatory Visit: Payer: Medicare HMO | Admitting: Family Medicine

## 2020-09-01 ENCOUNTER — Ambulatory Visit: Payer: Medicare HMO | Admitting: Internal Medicine

## 2020-09-07 DIAGNOSIS — M6281 Muscle weakness (generalized): Secondary | ICD-10-CM | POA: Diagnosis not present

## 2020-09-07 DIAGNOSIS — G4733 Obstructive sleep apnea (adult) (pediatric): Secondary | ICD-10-CM | POA: Diagnosis not present

## 2020-09-07 NOTE — Progress Notes (Signed)
I, [name], LAT, ATC acting as a scribe for Gina Leader, MD.  [name] is a 80 y.o. female who presents to [place] at [facility] today for f/u bilat knee and lower leg pain, R>L concerning for L4 radiculopathy. Pt was last seen by Dr. Georgina Mathews on 08/08/20 and was prescribed gabapentin. Today, pt reports no relief from gabapentin. Pt notes tenderness in her bilat lower legs and pain in her L-side low back.   Dx imaging: 08/08/20 L-spine & R knee XR 01/16/20 L knee XR             12/16/08 R & L knee XR  Pertinent review of systems: No fevers or chills  Relevant historical information: Diabetes   Exam:  BP 128/84   Pulse 76   Ht 4\' 11"  (1.499 m)   Wt 155 lb 12.8 oz (70.7 kg)   SpO2 95%   BMI 31.47 kg/m  General: Well Developed, well nourished, and in no acute distress.   MSK: Bilateral knees minimal effusion normal motion with crepitation stable ligamentous exam.  L-spine decreased lumbar motion.  Mildly positive slump test bilaterally.    Lab and Radiology Results  Procedure: Real-time Ultrasound Guided Injection of right knee superior lateral patellar space Device: Philips Affiniti 50G Images permanently stored and available for review in PACS Verbal informed consent obtained.  Discussed risks and benefits of procedure. Warned about infection bleeding damage to structures skin hypopigmentation and fat atrophy among others. Patient expresses understanding and agreement Time-out conducted.   Noted no overlying erythema, induration, or other signs of local infection.   Skin prepped in a sterile fashion.   Local anesthesia: Topical Ethyl chloride.   With sterile technique and under real time ultrasound guidance: 40 mg of Kenalog and 2 mL of Marcaine injected into knee joint. Fluid seen entering the joint capsule.   Completed without difficulty   Pain immediately resolved suggesting accurate placement of the medication.   Advised to call if  fevers/chills, erythema, induration, drainage, or persistent bleeding.   Images permanently stored and available for review in the ultrasound unit.  Impression: Technically successful ultrasound guided injection.    Procedure: Real-time Ultrasound Guided Injection of left knee superior lateral patellar space Device: Philips Affiniti 50G Images permanently stored and available for review in PACS Verbal informed consent obtained.  Discussed risks and benefits of procedure. Warned about infection bleeding damage to structures skin hypopigmentation and fat atrophy among others. Patient expresses understanding and agreement Time-out conducted.   Noted no overlying erythema, induration, or other signs of local infection.   Skin prepped in a sterile fashion.   Local anesthesia: Topical Ethyl chloride.   With sterile technique and under real time ultrasound guidance: 40 mg of Kenalog and 2 mL of Marcaine injected into knee joint. Fluid seen entering the joint capsule.   Completed without difficulty   Pain immediately resolved suggesting accurate placement of the medication.   Advised to call if fevers/chills, erythema, induration, drainage, or persistent bleeding.   Images permanently stored and available for review in the ultrasound unit.  Impression: Technically successful ultrasound guided injection.   EXAM: LUMBAR SPINE - 2-3 VIEW   COMPARISON:  None.   FINDINGS: Five lumbar type vertebral bodies. There is no evidence of lumbar spine fracture. Alignment is normal. Mild L4-L5 and L5-S1 disc space narrowing. Mild lower lumbar facet hypertrophy. Right upper quadrant surgical clips. Hernia repair mesh overlies the anterior abdominal wall. Aortic atherosclerosis.   IMPRESSION: Mild  lower lumbar spondylosis, no acute osseous abnormality.     Electronically Signed   By: Gina Bailiff MD   On: 08/10/2020 09:41  EXAM: RIGHT KNEE 3 VIEWS   COMPARISON:  December 16, 2008.    FINDINGS: No evidence of fracture, dislocation, or joint effusion. Mild patellofemoral and medial tibiofemoral degenerative change. Intramedullary sclerotic area in distal femur could reflect bone infarct or enchondroma. Soft tissues are unremarkable.   IMPRESSION: No acute osseous abnormality.  Mild degenerative joint disease.     Electronically Signed   By: Gina Bailiff MD   On: 08/10/2020 09:39  I, [name], personally (independently) visualized and performed the interpretation of the images attached in this note.     Assessment and Plan: 80 y.o. female with bilateral knee pain due to DJD exacerbation.  Plan for steroid injection bilaterally continue activity as tolerated.  Also recommend Voltaren gel.  Additionally patient has pain extending to the medial lower leg with hyperesthesia thought to be L4 radiculopathy bilaterally.  She is failing conservative management for this over the last month.  She been trying home exercise program with little benefit along with gabapentin.  X-rays were obtained last month L-spine which did show some degeneration at a level that does make sense for current symptoms.  Plan for MRI to further characterize possible L4 radiculopathy and for potential epidural steroid injection planning.  Recheck after MRI.   PDMP not reviewed this encounter. Orders Placed This Encounter  Procedures   [place] LIMITED JOINT SPACE STRUCTURES LOW BILAT(NO LINKED CHARGES)    Standing Status:   Future    Number of Occurrences:   1    Standing Expiration Date:   03/10/2021    Order Specific Question:   Reason for Exam (SYMPTOM  OR DIAGNOSIS REQUIRED)    Answer:   bilat knee pain    Order Specific Question:   Preferred imaging location?    Answer:   [place]   MR Lumbar Spine Wo Contrast    Standing Status:   Future    Standing Expiration Date:   09/08/2021    Order Specific Question:   What is the patient's sedation requirement?     Answer:   No Sedation    Order Specific Question:   Does the patient have a pacemaker or implanted devices?    Answer:   No    Order Specific Question:   Preferred imaging location?    Answer:   [facility] (table limit-550lbs)   No orders of the defined types were placed in this encounter.    Discussed warning signs or symptoms. Please see discharge instructions. Patient expresses understanding.   The above documentation has been reviewed and is accurate and complete [name], M.D.  Total encounter time 30 minutes including face-to-face time with the patient and, reviewing past medical record, and charting on the date of service.   Extensive discussion of treatment plan and options independent of time to perform injection.

## 2020-09-08 ENCOUNTER — Other Ambulatory Visit: Payer: Self-pay

## 2020-09-08 ENCOUNTER — Ambulatory Visit (INDEPENDENT_AMBULATORY_CARE_PROVIDER_SITE_OTHER): Payer: Medicare HMO | Admitting: Internal Medicine

## 2020-09-08 ENCOUNTER — Encounter: Payer: Self-pay | Admitting: Internal Medicine

## 2020-09-08 ENCOUNTER — Ambulatory Visit: Payer: Self-pay

## 2020-09-08 ENCOUNTER — Ambulatory Visit: Payer: Medicare HMO | Admitting: Family Medicine

## 2020-09-08 VITALS — BP 128/84 | HR 76 | Ht 59.0 in | Wt 155.8 lb

## 2020-09-08 VITALS — BP 112/70 | HR 66 | Temp 97.6°F | Ht 59.0 in | Wt 156.0 lb

## 2020-09-08 DIAGNOSIS — E118 Type 2 diabetes mellitus with unspecified complications: Secondary | ICD-10-CM

## 2020-09-08 DIAGNOSIS — M25561 Pain in right knee: Secondary | ICD-10-CM | POA: Diagnosis not present

## 2020-09-08 DIAGNOSIS — M25562 Pain in left knee: Secondary | ICD-10-CM | POA: Diagnosis not present

## 2020-09-08 DIAGNOSIS — G8929 Other chronic pain: Secondary | ICD-10-CM

## 2020-09-08 DIAGNOSIS — E039 Hypothyroidism, unspecified: Secondary | ICD-10-CM | POA: Diagnosis not present

## 2020-09-08 DIAGNOSIS — F411 Generalized anxiety disorder: Secondary | ICD-10-CM | POA: Diagnosis not present

## 2020-09-08 DIAGNOSIS — M5416 Radiculopathy, lumbar region: Secondary | ICD-10-CM | POA: Diagnosis not present

## 2020-09-08 DIAGNOSIS — F331 Major depressive disorder, recurrent, moderate: Secondary | ICD-10-CM | POA: Diagnosis not present

## 2020-09-08 DIAGNOSIS — I10 Essential (primary) hypertension: Secondary | ICD-10-CM

## 2020-09-08 NOTE — Patient Instructions (Signed)
Thank you for coming in today.   Call or go to the ER if you develop a large red swollen joint with extreme pain or oozing puss.   

## 2020-09-09 LAB — TSH: TSH: 0.04 u[IU]/mL — ABNORMAL LOW (ref 0.35–4.50)

## 2020-09-09 LAB — HEMOGLOBIN A1C: Hgb A1c MFr Bld: 6.6 % — ABNORMAL HIGH (ref 4.6–6.5)

## 2020-09-09 MED ORDER — LEVOTHYROXINE SODIUM 50 MCG PO TABS: 50.0000 ug | ORAL_TABLET | Freq: Every day | ORAL | 0 refills | Status: DC

## 2020-09-09 NOTE — Progress Notes (Signed)
Subjective:  Patient ID: [name], female    DOB: Jan 09, 1941  Age: 80 y.o. MRN: [id number]  CC: Diabetes, Hypertension, and Hypothyroidism  This visit occurred during the SARS-CoV-2 public health emergency.  Safety protocols were in place, including screening questions prior to the visit, additional usage of staff PPE, and extensive cleaning of exam room while observing appropriate contact time as indicated for disinfecting solutions.    HPI [name] presents for f/up - She offers no new complaints today.  She has mild, intermittent shortness of breath.  She denies chest pain, edema, diaphoresis, dizziness, or lightheadedness.  Outpatient Medications Prior to Visit  Medication Sig Dispense Refill   Blood Glucose Calibration (ACCU-CHEK GUIDE CONTROL) LIQD 1 Act by In Vitro route 2 (two) times daily as needed. 1 each 3   Blood Glucose Monitoring Suppl (ACCU-CHEK GUIDE ME) w/Device KIT 1 Act by Does not apply route 2 (two) times daily as needed. 2 kit 0   carvedilol (COREG) 3.125 MG tablet Take 1 tablet (3.125 mg total) by mouth 2 (two) times daily with a meal. 180 tablet 0   diclofenac Sodium (VOLTAREN) 1 % GEL Apply 4 g topically 4 (four) times daily. 100 g 5   diltiazem (TIAZAC) 180 MG 24 hr capsule      FLUoxetine (PROZAC) 20 MG capsule TAKE 1 CAPSULE AT BEDTIME 90 capsule 1   glucose blood (ACCU-CHEK GUIDE) test strip Use as instructed 100 each 5   indapamide (LOZOL) 2.5 MG tablet Take 1 tablet (2.5 mg total) by mouth daily. 90 tablet 0   losartan (COZAAR) 100 MG tablet Take 1 tablet (100 mg total) by mouth daily. 90 tablet 1   metFORMIN (GLUCOPHAGE-XR) 750 MG 24 hr tablet TAKE 1 TABLET BY MOUTH EVERY DAY WITH BREAKFAST 90 tablet 0   ONE TOUCH LANCETS MISC Use with One Touch Verio monitor as directed Twice Daily to test blood glucose levels. DX: 250.60 200 each 5   potassium chloride SA (KLOR-CON) 20 MEQ tablet Take 1 tablet (20 mEq total) by mouth daily. 3 tablet 0    rosuvastatin (CRESTOR) 10 MG tablet Take 1 tablet (10 mg total) by mouth daily. 90 tablet 3   triamcinolone cream (KENALOG) 0.5 %      levothyroxine (SYNTHROID) 75 MCG tablet TAKE 1 TABLET BY MOUTH EVERY DAY 90 tablet 0   gabapentin (NEURONTIN) 100 MG capsule Take 1-2 capsules (100-200 mg total) by mouth at bedtime as needed. (Patient not taking: Reported on 09/08/2020) 60 capsule 3   Multiple Vitamins-Minerals (CENTRUM SILVER PO) Take by mouth daily. (Patient not taking: Reported on 09/08/2020)     No facility-administered medications prior to visit.    ROS Review of Systems  Constitutional:  Negative for diaphoresis, fatigue and unexpected weight change.  HENT: Negative.    Eyes: Negative.   Respiratory:  Positive for shortness of breath. Negative for cough, wheezing and stridor.   Cardiovascular:  Negative for chest pain, palpitations and leg swelling.  Gastrointestinal:  Negative for abdominal pain, constipation, diarrhea and nausea.  Endocrine: Negative for cold intolerance and heat intolerance.  Musculoskeletal: Negative.   Skin: Negative.  Negative for color change and pallor.  Neurological:  Negative for dizziness, weakness, light-headedness and headaches.  Hematological:  Negative for adenopathy. Does not bruise/bleed easily.  Psychiatric/Behavioral: Negative.     Objective:  BP 112/70 (BP Location: Left Arm, Patient Position: Sitting, Cuff Size: Large)   Pulse 66   Temp 97.6 F (36.4 C) (Oral)  Ht _0  (1.499 m)   Wt 156 lb (70.8 kg)   SpO2 99%   BMI 31.51 kg/m   BP Readings from Last 3 Encounters:  09/08/20 112/70  09/08/20 128/84  08/08/20 110/76    Wt Readings from Last 3 Encounters:  09/08/20 156 lb (70.8 kg)  09/08/20 155 lb 12.8 oz (70.7 kg)  08/08/20 161 lb 12.8 oz (73.4 kg)    Physical Exam Vitals reviewed.  Constitutional:      Appearance: Normal appearance.  HENT:     Mouth/Throat:     Mouth: Mucous membranes are moist.  Eyes:      Conjunctiva/sclera: Conjunctivae normal.  Cardiovascular:     Rate and Rhythm: Normal rate and regular rhythm.     Heart sounds: No murmur heard. Pulmonary:     Effort: Pulmonary effort is normal.     Breath sounds: No stridor. No wheezing, rhonchi or rales.  Abdominal:     General: Abdomen is protuberant. Bowel sounds are normal. There is no distension.     Palpations: Abdomen is soft. There is no hepatomegaly, splenomegaly or mass.     Tenderness: There is no abdominal tenderness. There is no guarding.  Musculoskeletal:        General: Normal range of motion.     Cervical back: Neck supple.     Right lower leg: No edema.     Left lower leg: No edema.  Skin:    General: Skin is warm and dry.  Neurological:     General: No focal deficit present.     Mental Status: She is alert.  Psychiatric:        Mood and Affect: Mood normal.        Behavior: Behavior normal.    Lab Results  Component Value Date   WBC 9.0 07/05/2020   HGB 12.7 07/05/2020   HCT 38.6 07/05/2020   PLT 248.0 07/05/2020   GLUCOSE 80 07/05/2020   CHOL 166 01/19/2020   TRIG 114.0 01/19/2020   HDL 64.80 01/19/2020   LDLDIRECT 120.6 11/24/2012   LDLCALC 79 01/19/2020   ALT 18 03/08/2015   AST 19 03/08/2015   NA 140 07/05/2020   K 4.7 07/05/2020   CL 104 07/05/2020   CREATININE 1.02 07/05/2020   BUN 21 07/05/2020   CO2 30 07/05/2020   TSH 0.04 (L) 09/08/2020   INR 1.0 10/01/2011   HGBA1C 6.6 (H) 09/08/2020   MICROALBUR 0.8 06/23/2014    DG Pelvis 1-2 Views  Result Date: 01/16/2020 CLINICAL DATA:  Multiple falls.  Left hip pain EXAM: PELVIS - 1-2 VIEW COMPARISON:  None. FINDINGS: There is no evidence of pelvic fracture or diastasis. No pelvic bone lesions are seen. IMPRESSION: Negative. Electronically Signed   By: Rolm Baptise M.D.   On: 01/16/2020 17:31   CT Head Wo Contrast  Result Date: 01/16/2020 CLINICAL DATA:  Fall, facial trauma EXAM: CT HEAD WITHOUT CONTRAST TECHNIQUE: Contiguous axial  images were obtained from the base of the skull through the vertex without intravenous contrast. COMPARISON:  None. FINDINGS: Brain: There is atrophy and chronic small vessel disease changes. No acute intracranial abnormality. Specifically, no hemorrhage, hydrocephalus, mass lesion, acute infarction, or significant intracranial injury. Vascular: No hyperdense vessel or unexpected calcification. Skull: No acute calvarial abnormality. Sinuses/Orbits: Visualized paranasal sinuses and mastoids clear. Orbital soft tissues unremarkable. Other: None IMPRESSION: Atrophy, chronic microvascular disease. No acute intracranial abnormality. Electronically Signed   By: Rolm Baptise M.D.   On: 01/16/2020 18:16  CT Cervical Spine Wo Contrast  Result Date: 01/16/2020 CLINICAL DATA:  Fall, neck trauma EXAM: CT CERVICAL SPINE WITHOUT CONTRAST TECHNIQUE: Multidetector CT imaging of the cervical spine was performed without intravenous contrast. Multiplanar CT image reconstructions were also generated. COMPARISON:  None. FINDINGS: Alignment: Negative Skull base and vertebrae: No acute fracture. No primary bone lesion or focal pathologic process. Soft tissues and spinal canal: No prevertebral fluid or swelling. No visible canal hematoma. Disc levels: Mild diffuse degenerative disc disease and facet disease. Upper chest: No acute findings Other: None IMPRESSION: No acute bony abnormality in the cervical spine. Electronically Signed   By: Rolm Baptise M.D.   On: 01/16/2020 18:18   DG Knee Complete 4 Views Left  Result Date: 01/16/2020 CLINICAL DATA:  Fall, left leg pain EXAM: LEFT KNEE - COMPLETE 4+ VIEW COMPARISON:  None. FINDINGS: No acute bony abnormality. Specifically, no fracture, subluxation, or dislocation. Joint spaces maintained. No joint effusion. Sclerotic area in the distal femur could reflect bone infarct or enchondroma. IMPRESSION: No acute bony abnormality. Electronically Signed   By: Rolm Baptise M.D.   On:  01/16/2020 17:29   DG Femur Min 2 Views Left  Result Date: 01/16/2020 CLINICAL DATA:  Multiple falls.  Left leg pain, hip pain EXAM: LEFT FEMUR 2 VIEWS COMPARISON:  12/16/2008 FINDINGS: No acute bony abnormality. Specifically, no fracture, subluxation, or dislocation. Sclerotic area in the distal femur could reflect bone infarct or enchondroma. IMPRESSION: No acute bony abnormality. Electronically Signed   By: Rolm Baptise M.D.   On: 01/16/2020 17:31    Assessment & Plan:   [name] was seen today for diabetes, hypertension and hypothyroidism.  Diagnoses and all orders for this visit:  Essential hypertension- Her blood pressure is adequately well controlled.  Type II diabetes mellitus with manifestations ([place])- Her blood sugar is adequately well controlled. -     Hemoglobin A1c; Future -     Hemoglobin A1c  Acquired hypothyroidism- Her T4 dose is too high.  I recommended that she lower her dose to 50 mcg a day. -     TSH; Future -     TSH -     levothyroxine (SYNTHROID) 50 MCG tablet; Take 1 tablet (50 mcg total) by mouth daily.  I have discontinued Nealie Giese's levothyroxine. I am also having her start on levothyroxine. Additionally, I am having her maintain her Multiple Vitamins-Minerals (CENTRUM SILVER PO), ONE TOUCH LANCETS, potassium chloride SA, diclofenac Sodium, indapamide, Accu-Chek Guide Me, Accu-Chek Guide, carvedilol, losartan, Accu-Chek Guide Control, FLUoxetine, diltiazem, triamcinolone cream, rosuvastatin, metFORMIN, and gabapentin.  Meds ordered this encounter  Medications   levothyroxine (SYNTHROID) 50 MCG tablet    Sig: Take 1 tablet (50 mcg total) by mouth daily.    Dispense:  90 tablet    Refill:  0     Follow-up: No follow-ups on file.  Scarlette Calico, MD

## 2020-09-23 ENCOUNTER — Other Ambulatory Visit: Payer: Medicare HMO

## 2020-09-26 ENCOUNTER — Other Ambulatory Visit: Payer: Medicare HMO

## 2020-09-26 ENCOUNTER — Other Ambulatory Visit: Payer: Self-pay | Admitting: Internal Medicine

## 2020-09-26 DIAGNOSIS — G4733 Obstructive sleep apnea (adult) (pediatric): Secondary | ICD-10-CM

## 2020-09-28 ENCOUNTER — Telehealth: Payer: Self-pay | Admitting: Internal Medicine

## 2020-09-28 NOTE — Telephone Encounter (Signed)
LVM for pt to rtn my call to schedule AWV with [facility]. Please schedule this appt if pt calls the office.  °

## 2020-09-30 ENCOUNTER — Ambulatory Visit
Admission: RE | Admit: 2020-09-30 | Discharge: 2020-09-30 | Disposition: A | Discharge status: Home / Self Care | Payer: Medicare HMO | Source: Ambulatory Visit | Attending: Family Medicine | Admitting: Family Medicine

## 2020-09-30 ENCOUNTER — Other Ambulatory Visit: Payer: Self-pay | Admitting: Internal Medicine

## 2020-09-30 ENCOUNTER — Other Ambulatory Visit: Payer: Self-pay

## 2020-09-30 DIAGNOSIS — I10 Essential (primary) hypertension: Secondary | ICD-10-CM

## 2020-09-30 DIAGNOSIS — M5416 Radiculopathy, lumbar region: Secondary | ICD-10-CM

## 2020-09-30 DIAGNOSIS — M545 Low back pain, unspecified: Secondary | ICD-10-CM | POA: Diagnosis not present

## 2020-10-03 NOTE — Progress Notes (Signed)
MRI lumbar spine does not show evidence of pinched nerves causing your leg pain.  You do not have much arthritis in the low back.  The radiologist read your arthritis as mild for age.  Okay to schedule a follow-up appoint with me to go over this result in further detail and to discuss potential next steps if you would like.  I think we have a little more looking to do to explain your lower leg pain.  It could be coming from just your knees possibly.

## 2020-10-05 ENCOUNTER — Telehealth: Payer: Self-pay | Admitting: Internal Medicine

## 2020-10-05 DIAGNOSIS — Z03818 Encounter for observation for suspected exposure to other biological agents ruled out: Secondary | ICD-10-CM | POA: Diagnosis not present

## 2020-10-05 NOTE — Telephone Encounter (Signed)
LVM for pt to rtn mycall to schedule AWV with [facility]. Please schedule AWV with [facility] if pt calls the office.

## 2020-10-07 DIAGNOSIS — G4733 Obstructive sleep apnea (adult) (pediatric): Secondary | ICD-10-CM | POA: Diagnosis not present

## 2020-10-07 DIAGNOSIS — L603 Nail dystrophy: Secondary | ICD-10-CM | POA: Diagnosis not present

## 2020-10-07 DIAGNOSIS — L84 Corns and callosities: Secondary | ICD-10-CM | POA: Diagnosis not present

## 2020-10-07 DIAGNOSIS — R2681 Unsteadiness on feet: Secondary | ICD-10-CM | POA: Diagnosis not present

## 2020-10-07 DIAGNOSIS — M79673 Pain in unspecified foot: Secondary | ICD-10-CM | POA: Diagnosis not present

## 2020-10-07 DIAGNOSIS — B351 Tinea unguium: Secondary | ICD-10-CM | POA: Diagnosis not present

## 2020-10-07 DIAGNOSIS — E118 Type 2 diabetes mellitus with unspecified complications: Secondary | ICD-10-CM | POA: Diagnosis not present

## 2020-10-12 ENCOUNTER — Ambulatory Visit: Payer: Medicare HMO

## 2020-10-13 ENCOUNTER — Ambulatory Visit (INDEPENDENT_AMBULATORY_CARE_PROVIDER_SITE_OTHER): Payer: Medicare HMO | Admitting: Internal Medicine

## 2020-10-13 ENCOUNTER — Other Ambulatory Visit: Payer: Self-pay

## 2020-10-13 ENCOUNTER — Encounter: Payer: Self-pay | Admitting: Internal Medicine

## 2020-10-13 VITALS — BP 126/82 | HR 76 | Temp 98.1°F | Resp 16 | Ht 59.0 in | Wt 155.0 lb

## 2020-10-13 DIAGNOSIS — E039 Hypothyroidism, unspecified: Secondary | ICD-10-CM

## 2020-10-13 DIAGNOSIS — I10 Essential (primary) hypertension: Secondary | ICD-10-CM | POA: Diagnosis not present

## 2020-10-13 DIAGNOSIS — G4733 Obstructive sleep apnea (adult) (pediatric): Secondary | ICD-10-CM | POA: Diagnosis not present

## 2020-10-13 DIAGNOSIS — G4452 New daily persistent headache (NDPH): Secondary | ICD-10-CM

## 2020-10-13 LAB — TSH: TSH: 2.13 u[IU]/mL (ref 0.35–5.50)

## 2020-10-13 NOTE — Progress Notes (Signed)
Subjective:  Patient ID: [name], female    DOB: 12/05/1940  Age: 80 y.o. MRN: [id number]  CC: Headache, Hypertension, Diabetes, and Hypothyroidism  This visit occurred during the SARS-CoV-2 public health emergency.  Safety protocols were in place, including screening questions prior to the visit, additional usage of staff PPE, and extensive cleaning of exam room while observing appropriate contact time as indicated for disinfecting solutions.    HPI [name] presents for f/up -  Since I last saw her she has developed a daily headache that occurs all day and all night.  She describes it as a diffuse achy sensation in the frontal and facial regions.  She has dizziness and lightheadedness but denies nausea, vomiting, or paresthesias.  She has had a few episodes of presyncope.  She also complains of a discomfort around her left abdomen, flank and breast that she describes as a wrapping sensation.  She complains that her memory is failing.  She would like to see someone about sleep apnea.  Outpatient Medications Prior to Visit  Medication Sig Dispense Refill   Blood Glucose Calibration (ACCU-CHEK GUIDE CONTROL) LIQD 1 Act by In Vitro route 2 (two) times daily as needed. 1 each 3   Blood Glucose Monitoring Suppl (ACCU-CHEK GUIDE ME) w/Device KIT 1 Act by Does not apply route 2 (two) times daily as needed. 2 kit 0   carvedilol (COREG) 3.125 MG tablet Take 1 tablet (3.125 mg total) by mouth 2 (two) times daily with a meal. 180 tablet 0   diclofenac Sodium (VOLTAREN) 1 % GEL Apply 4 g topically 4 (four) times daily. 100 g 5   diltiazem (TIAZAC) 180 MG 24 hr capsule      FLUoxetine (PROZAC) 20 MG capsule TAKE 1 CAPSULE AT BEDTIME 90 capsule 1   gabapentin (NEURONTIN) 100 MG capsule Take 1-2 capsules (100-200 mg total) by mouth at bedtime as needed. 60 capsule 3   glucose blood (ACCU-CHEK GUIDE) test strip Use as instructed 100 each 5   levothyroxine (SYNTHROID) 50 MCG tablet Take 1 tablet  (50 mcg total) by mouth daily. 90 tablet 0   losartan (COZAAR) 100 MG tablet Take 1 tablet (100 mg total) by mouth daily. 90 tablet 1   metFORMIN (GLUCOPHAGE-XR) 750 MG 24 hr tablet TAKE 1 TABLET BY MOUTH EVERY DAY WITH BREAKFAST 90 tablet 0   Multiple Vitamins-Minerals (CENTRUM SILVER PO) Take by mouth daily.     potassium chloride SA (KLOR-CON) 20 MEQ tablet Take 1 tablet (20 mEq total) by mouth daily. 3 tablet 0   rosuvastatin (CRESTOR) 10 MG tablet Take 1 tablet (10 mg total) by mouth daily. 90 tablet 3   triamcinolone cream (KENALOG) 0.5 %      indapamide (LOZOL) 2.5 MG tablet TAKE 1 TABLET BY MOUTH EVERY DAY 90 tablet 0   ONE TOUCH LANCETS MISC Use with One Touch Verio monitor as directed Twice Daily to test blood glucose levels. DX: 250.60 200 each 5   No facility-administered medications prior to visit.    ROS Review of Systems  Constitutional:  Negative for diaphoresis and fatigue.  HENT: Negative.    Eyes:  Negative for visual disturbance.  Respiratory:  Positive for apnea. Negative for cough, shortness of breath and wheezing.   Cardiovascular:  Negative for chest pain, palpitations and leg swelling.  Gastrointestinal:  Negative for abdominal pain, diarrhea, nausea and vomiting.  Genitourinary: Negative.  Negative for difficulty urinating.  Musculoskeletal: Negative.  Negative for arthralgias and myalgias.  Skin:  Negative.  Negative for color change.  Neurological:  Positive for dizziness, light-headedness and headaches. Negative for weakness.  Hematological:  Negative for adenopathy. Does not bruise/bleed easily.  Psychiatric/Behavioral:  Positive for decreased concentration. Negative for confusion and sleep disturbance. The patient is not nervous/anxious.    Objective:  BP 126/82 (BP Location: Right Arm, Patient Position: Sitting, Cuff Size: Large)   Pulse 76   Temp 98.1 F (36.7 C)   Resp 16   Ht $R'4\' 11"'Vh$  (1.499 m)   Wt 155 lb (70.3 kg)   SpO2 99%   BMI 31.31 kg/m    BP Readings from Last 3 Encounters:  10/13/20 126/82  09/08/20 112/70  09/08/20 128/84    Wt Readings from Last 3 Encounters:  10/13/20 155 lb (70.3 kg)  09/08/20 156 lb (70.8 kg)  09/08/20 155 lb 12.8 oz (70.7 kg)    Physical Exam Vitals reviewed.  HENT:     Nose: Nose normal.     Mouth/Throat:     Mouth: Mucous membranes are moist.  Eyes:     General: No scleral icterus.    Pupils: Pupils are equal, round, and reactive to light.  Cardiovascular:     Rate and Rhythm: Normal rate and regular rhythm.     Heart sounds: No murmur heard. Pulmonary:     Effort: Pulmonary effort is normal.     Breath sounds: No stridor. No wheezing, rhonchi or rales.  Abdominal:     General: Abdomen is flat.     Palpations: There is no mass.     Tenderness: There is no abdominal tenderness. There is no guarding.  Musculoskeletal:        General: Normal range of motion.     Cervical back: Neck supple.     Right lower leg: No edema.     Left lower leg: No edema.  Lymphadenopathy:     Cervical: No cervical adenopathy.  Skin:    General: Skin is warm and dry.  Neurological:     General: No focal deficit present.     Mental Status: She is alert and oriented to person, place, and time. Mental status is at baseline.  Psychiatric:        Mood and Affect: Mood normal.        Behavior: Behavior normal.    Lab Results  Component Value Date   WBC 9.0 07/05/2020   HGB 12.7 07/05/2020   HCT 38.6 07/05/2020   PLT 248.0 07/05/2020   GLUCOSE 80 07/05/2020   CHOL 166 01/19/2020   TRIG 114.0 01/19/2020   HDL 64.80 01/19/2020   LDLDIRECT 120.6 11/24/2012   LDLCALC 79 01/19/2020   ALT 18 03/08/2015   AST 19 03/08/2015   NA 140 07/05/2020   K 4.7 07/05/2020   CL 104 07/05/2020   CREATININE 1.02 07/05/2020   BUN 21 07/05/2020   CO2 30 07/05/2020   TSH 2.13 10/13/2020   INR 1.0 10/01/2011   HGBA1C 6.6 (H) 09/08/2020   MICROALBUR 0.8 06/23/2014    MR Lumbar Spine Wo Contrast  Result  Date: 10/02/2020 CLINICAL DATA:  Low back pain for over 6 weeks. EXAM: MRI LUMBAR SPINE WITHOUT CONTRAST TECHNIQUE: Multiplanar, multisequence MR imaging of the lumbar spine was performed. No intravenous contrast was administered. COMPARISON:  None. FINDINGS: Segmentation:  5 lumbar type vertebrae Alignment:  Physiologic Vertebrae:  No fracture, evidence of discitis, or bone lesion. Conus medullaris and cauda equina: Conus extends to the L1-2 level. Conus and cauda equina appear  normal. Paraspinal and other soft tissues: No evidence of mass or inflammation. Disc levels: Well preserved disc height and hydration for age. Mild for age facet spurring at L4-5 and L5-S1. No herniation or neural impingement in the lumbar spine. IMPRESSION: Less than typical degenerative changes for age. No neural impingement or visible inflammation. Electronically Signed   By: Monte Fantasia M.D.   On: 10/02/2020 21:41    Assessment & Plan:   [name] was seen today for headache, hypertension, diabetes and hypothyroidism.  Diagnoses and all orders for this visit:  Acquired hypothyroidism- Her TSH is in the normal range.  She will stay on the current dose of levothyroxine. -     TSH; Future -     TSH  Essential hypertension- Her blood pressure is overcontrolled and she is symptomatic.  Will discontinue indapamide.  OBSTRUCTIVE SLEEP APNEA -     Ambulatory referral to Pulmonology  New daily persistent headache- I recommended she undergo an MRI to screen for mass, tumor, NPH, CVA, or bleed. -     MR Brain Wo Contrast; Future  I have discontinued [name]'s indapamide. I am also having her maintain her Multiple Vitamins-Minerals (CENTRUM SILVER PO), ONE TOUCH LANCETS, potassium chloride SA, diclofenac Sodium, Accu-Chek Guide Me, Accu-Chek Guide, carvedilol, losartan, Accu-Chek Guide Control, FLUoxetine, diltiazem, triamcinolone cream, rosuvastatin, metFORMIN, gabapentin, and levothyroxine.  No orders of the defined  types were placed in this encounter.    Follow-up: Return in about 3 months (around 01/13/2021).  Gina Calico, MD

## 2020-10-13 NOTE — Patient Instructions (Signed)

## 2020-10-18 ENCOUNTER — Ambulatory Visit: Payer: Medicare HMO | Admitting: Family Medicine

## 2020-10-20 NOTE — Progress Notes (Deleted)
   I, [name], LAT, ATC acting as a scribe for [name], MD.  [name] is a 80 y.o. female who presents to [place] at [facility] today for f/u bilat knee and lower leg pain, R>L concerning for L4 radiculopathy, and L-spine MRI review. Pt was last seen by Dr. Georgina Snell on 09/08/20 and was given bilat knee steroid injections and advised to cont activity as tolerated, use Voltaren gel, and proceed to L-spine MRI. Today, pt reports   Dx imaging: 09/30/20 L-spine MRI 08/08/20 L-spine & R knee XR 01/16/20 L knee XR             12/16/08 R & L knee XR   Pertinent review of systems: ***  Relevant historical information: ***   Exam:  There were no vitals taken for this visit. General: Well Developed, well nourished, and in no acute distress.   MSK: ***    Lab and Radiology Results No results found for this or any previous visit (from the past 72 hour(s)). No results found.     Assessment and Plan: 80 y.o. female with ***   PDMP not reviewed this encounter. No orders of the defined types were placed in this encounter.  No orders of the defined types were placed in this encounter.    Discussed warning signs or symptoms. Please see discharge instructions. Patient expresses understanding.   ***

## 2020-10-21 ENCOUNTER — Ambulatory Visit: Payer: Medicare HMO | Admitting: Family Medicine

## 2020-10-21 ENCOUNTER — Ambulatory Visit: Payer: Medicare HMO

## 2020-10-25 ENCOUNTER — Telehealth: Payer: Self-pay | Admitting: Internal Medicine

## 2020-10-25 NOTE — Chronic Care Management (AMB) (Signed)
  Chronic Care Management   Outreach Note  10/25/2020 Name: [name] MRN: [id number] DOB: 1940/05/19  Referred by: Janith Lima, MD Reason for referral : No chief complaint on file.   An unsuccessful telephone outreach was attempted today. The patient was referred to the pharmacist for assistance with care management and care coordination.   Follow Up Plan:   Lauretta Grill [employer] Scheduler

## 2020-10-30 ENCOUNTER — Other Ambulatory Visit: Payer: Medicare HMO

## 2020-10-31 ENCOUNTER — Ambulatory Visit
Admission: RE | Admit: 2020-10-31 | Discharge: 2020-10-31 | Disposition: A | Discharge status: Home / Self Care | Payer: Medicare HMO | Source: Ambulatory Visit | Attending: Internal Medicine | Admitting: Internal Medicine

## 2020-10-31 ENCOUNTER — Other Ambulatory Visit: Payer: Self-pay

## 2020-10-31 DIAGNOSIS — G4452 New daily persistent headache (NDPH): Secondary | ICD-10-CM | POA: Diagnosis not present

## 2020-10-31 DIAGNOSIS — I6782 Cerebral ischemia: Secondary | ICD-10-CM | POA: Diagnosis not present

## 2020-10-31 DIAGNOSIS — J341 Cyst and mucocele of nose and nasal sinus: Secondary | ICD-10-CM | POA: Diagnosis not present

## 2020-10-31 DIAGNOSIS — G319 Degenerative disease of nervous system, unspecified: Secondary | ICD-10-CM | POA: Diagnosis not present

## 2020-11-04 ENCOUNTER — Telehealth: Payer: Self-pay | Admitting: Internal Medicine

## 2020-11-04 NOTE — Telephone Encounter (Signed)
Patient wants to know if she could continue taking Prevagen or does provider suggest something else  Also would like provider to submit referral for mammogram

## 2020-11-07 DIAGNOSIS — G4733 Obstructive sleep apnea (adult) (pediatric): Secondary | ICD-10-CM | POA: Diagnosis not present

## 2020-11-08 ENCOUNTER — Other Ambulatory Visit: Payer: Self-pay | Admitting: Internal Medicine

## 2020-11-08 DIAGNOSIS — Z0001 Encounter for general adult medical examination with abnormal findings: Secondary | ICD-10-CM | POA: Insufficient documentation

## 2020-11-08 DIAGNOSIS — Z1231 Encounter for screening mammogram for malignant neoplasm of breast: Secondary | ICD-10-CM

## 2020-11-10 ENCOUNTER — Telehealth: Payer: Self-pay | Admitting: Lab

## 2020-11-10 NOTE — Chronic Care Management (AMB) (Signed)
  Chronic Care Management   Outreach Note  11/10/2020 Name: [name] MRN: [id number] DOB: 01-21-1941  Referred by: Janith Lima, MD Reason for referral : Medication Management   Third unsuccessful telephone outreach was attempted today. The patient was referred to the pharmacist for assistance with care management and care coordination.   Follow Up Plan:   [place]

## 2020-11-12 ENCOUNTER — Other Ambulatory Visit: Payer: Self-pay | Admitting: Internal Medicine

## 2020-11-12 DIAGNOSIS — E118 Type 2 diabetes mellitus with unspecified complications: Secondary | ICD-10-CM

## 2020-11-30 ENCOUNTER — Institutional Professional Consult (permissible substitution): Payer: Medicare HMO | Admitting: Pulmonary Disease

## 2020-12-03 ENCOUNTER — Other Ambulatory Visit: Payer: Self-pay | Admitting: Internal Medicine

## 2020-12-03 DIAGNOSIS — I10 Essential (primary) hypertension: Secondary | ICD-10-CM

## 2020-12-03 MED ORDER — DILTIAZEM HCL ER BEADS 180 MG PO CP24: 180.0000 mg | ORAL_CAPSULE | Freq: Every day | ORAL | 0 refills | Status: DC

## 2020-12-03 MED ORDER — POTASSIUM CHLORIDE CRYS ER 20 MEQ PO TBCR: 20.0000 meq | EXTENDED_RELEASE_TABLET | Freq: Every day | ORAL | 0 refills | Status: DC

## 2020-12-07 ENCOUNTER — Other Ambulatory Visit: Payer: Self-pay | Admitting: Internal Medicine

## 2020-12-07 DIAGNOSIS — E039 Hypothyroidism, unspecified: Secondary | ICD-10-CM

## 2020-12-08 DIAGNOSIS — G4733 Obstructive sleep apnea (adult) (pediatric): Secondary | ICD-10-CM | POA: Diagnosis not present

## 2020-12-14 ENCOUNTER — Institutional Professional Consult (permissible substitution): Payer: Medicare HMO | Admitting: Pulmonary Disease

## 2020-12-21 ENCOUNTER — Other Ambulatory Visit: Payer: Self-pay

## 2020-12-22 ENCOUNTER — Ambulatory Visit (INDEPENDENT_AMBULATORY_CARE_PROVIDER_SITE_OTHER): Payer: Medicare HMO | Admitting: Family Medicine

## 2020-12-22 ENCOUNTER — Ambulatory Visit: Payer: Self-pay

## 2020-12-22 ENCOUNTER — Encounter: Payer: Self-pay | Admitting: Internal Medicine

## 2020-12-22 ENCOUNTER — Ambulatory Visit (INDEPENDENT_AMBULATORY_CARE_PROVIDER_SITE_OTHER): Payer: Medicare HMO | Admitting: Internal Medicine

## 2020-12-22 VITALS — BP 132/86 | HR 67 | Ht 59.0 in | Wt 151.6 lb

## 2020-12-22 VITALS — BP 128/76 | HR 65 | Temp 97.9°F | Resp 16 | Ht 59.0 in | Wt 151.0 lb

## 2020-12-22 DIAGNOSIS — N1832 Chronic kidney disease, stage 3b: Secondary | ICD-10-CM | POA: Diagnosis not present

## 2020-12-22 DIAGNOSIS — I10 Essential (primary) hypertension: Secondary | ICD-10-CM

## 2020-12-22 DIAGNOSIS — G8929 Other chronic pain: Secondary | ICD-10-CM | POA: Diagnosis not present

## 2020-12-22 DIAGNOSIS — M25561 Pain in right knee: Secondary | ICD-10-CM | POA: Diagnosis not present

## 2020-12-22 DIAGNOSIS — H6123 Impacted cerumen, bilateral: Secondary | ICD-10-CM | POA: Diagnosis not present

## 2020-12-22 DIAGNOSIS — M25562 Pain in left knee: Secondary | ICD-10-CM | POA: Diagnosis not present

## 2020-12-22 DIAGNOSIS — E039 Hypothyroidism, unspecified: Secondary | ICD-10-CM

## 2020-12-22 DIAGNOSIS — E118 Type 2 diabetes mellitus with unspecified complications: Secondary | ICD-10-CM

## 2020-12-22 DIAGNOSIS — F5104 Psychophysiologic insomnia: Secondary | ICD-10-CM

## 2020-12-22 LAB — BASIC METABOLIC PANEL
BUN: 13 mg/dL (ref 6–23)
CO2: 22 mEq/L (ref 19–32)
Calcium: 9.8 mg/dL (ref 8.4–10.5)
Chloride: 106 mEq/L (ref 96–112)
Creatinine, Ser: 0.84 mg/dL (ref 0.40–1.20)
GFR: 65.11 mL/min (ref >60.00)
Glucose, Bld: 93 mg/dL (ref 70–99)
Potassium: 4 mEq/L (ref 3.5–5.1)
Sodium: 139 mEq/L (ref 135–145)

## 2020-12-22 LAB — CBC WITH DIFFERENTIAL/PLATELET
Basophils Absolute: 0 10*3/uL (ref 0.0–0.1)
Basophils Relative: 0.4 % (ref 0.0–3.0)
Eosinophils Absolute: 0 10*3/uL (ref 0.0–0.7)
Eosinophils Relative: 0.4 % (ref 0.0–5.0)
HCT: 37.3 % (ref 36.0–46.0)
Hemoglobin: 12.1 g/dL (ref 12.0–15.0)
Lymphocytes Relative: 21.9 % (ref 12.0–46.0)
Lymphs Abs: 1.6 10*3/uL (ref 0.7–4.0)
MCHC: 32.5 g/dL (ref 30.0–36.0)
MCV: 95.5 fl (ref 78.0–100.0)
Monocytes Absolute: 0.4 10*3/uL (ref 0.1–1.0)
Monocytes Relative: 5 % (ref 3.0–12.0)
Neutro Abs: 5.3 10*3/uL (ref 1.4–7.7)
Neutrophils Relative %: 72.3 % (ref 43.0–77.0)
Platelets: 272 10*3/uL (ref 150.0–400.0)
RBC: 3.9 Mil/uL (ref 3.87–5.11)
RDW: 15.2 % (ref 11.5–15.5)
WBC: 7.3 10*3/uL (ref 4.0–10.5)

## 2020-12-22 LAB — HEMOGLOBIN A1C: Hgb A1c MFr Bld: 5.7 % (ref 4.6–6.5)

## 2020-12-22 LAB — TSH: TSH: 0.43 u[IU]/mL (ref 0.35–5.50)

## 2020-12-22 MED ORDER — BELSOMRA 15 MG PO TABS: 15.0000 mg | ORAL_TABLET | Freq: Every evening | ORAL | 1 refills | Status: DC | PRN

## 2020-12-22 NOTE — Patient Instructions (Signed)
Thank you for coming in today.   You received steroid injections today. Seek immediate medical attention if the joint becomes red, extremely painful, or is oozing fluid.   I recommend you obtained a compression sleeve to help with your joint problems. There are many options on the market however I recommend obtaining a full knee Body Helix compression sleeve.  You can find information (including how to appropriate measure yourself for sizing) can be found at www.Body http://www.lambert.com/.  Many of these products are health savings account (HSA) eligible.   You can use the compression sleeve at any time throughout the day but is most important to use while being active as well as for 2 hours post-activity.   It is appropriate to ice following activity with the compression sleeve in place.   Please go to [facility] supply to get the full knee compression Body Helix we talked about today. You may also be able to get it from [employer].    Recheck back as needed.

## 2020-12-22 NOTE — Progress Notes (Signed)
Patient consent obtained. Irrigation with water and peroxide performed. Full view of tympanic membranes after procedure.  Patient tolerated procedure well.   

## 2020-12-22 NOTE — Progress Notes (Signed)
I, [name], LAT, ATC acting as a scribe for [name], MD.  [name] is a 80 y.o. female who presents to [place] at [facility] today for continued chronic bilat knee pain. Pt was last seen by Dr. Georgina Snell on 09/08/20 and was given bilat knee steroid injections and advised to use Voltaren gel. At last visit, pt also c/o pain extending to the medial lower leg with hyperesthesia thought to be L4 radiculopathy bilaterally and was advised to proceed to MRI to further characterize the cause of pain. Today, pt reports bilat knees started hurting again over the last few weeks. Pt c/o weakness, disoriented, HA, and out of focus and will address these issues w/ her PCP later today.  Dx imaging: 09/30/20 L-spine MRI 08/08/20 L-spine & R knee XR 01/16/20 L knee XR             12/16/08 R & L knee XR  Pertinent review of systems: No fevers or chills.  Positive for fatigue  Relevant historical information: Sleep apnea.  Diabetes.  CKD 3.   Exam:  BP 132/86   Pulse 67   Ht 4\' 11"  (1.499 m)   Wt 151 lb 9.6 oz (68.8 kg)   SpO2 98%   BMI 30.62 kg/m  General: Well Developed, well nourished, and in no acute distress.   MSK: Right knee mild effusion normal-appearing otherwise normal motion with crepitation.  Stable ligamentous exam  Left knee mild effusion normal-appearing otherwise normal motion with crepitation stable ligamentous exam  Mild antalgic gait.    Lab and Radiology Results   Procedure: Real-time Ultrasound Guided Injection of right knee superior lateral patellar space Device: Philips Affiniti 50G Images permanently stored and available for review in PACS Verbal informed consent obtained.  Discussed risks and benefits of procedure. Warned about infection bleeding damage to structures skin hypopigmentation and fat atrophy among others. Patient expresses understanding and agreement Time-out conducted.   Noted no overlying erythema, induration, or other  signs of local infection.   Skin prepped in a sterile fashion.   Local anesthesia: Topical Ethyl chloride.   With sterile technique and under real time ultrasound guidance: 40 mg of Kenalog and 2 mL of Marcaine injected into knee joint. Fluid seen entering the joint capsule.   Completed without difficulty   Pain immediately resolved suggesting accurate placement of the medication.   Advised to call if fevers/chills, erythema, induration, drainage, or persistent bleeding.   Images permanently stored and available for review in the ultrasound unit.  Impression: Technically successful ultrasound guided injection.   Procedure: Real-time Ultrasound Guided Injection of left knee superior lateral patellar space Device: Philips Affiniti 50G Images permanently stored and available for review in PACS Verbal informed consent obtained.  Discussed risks and benefits of procedure. Warned about infection bleeding damage to structures skin hypopigmentation and fat atrophy among others. Patient expresses understanding and agreement Time-out conducted.   Noted no overlying erythema, induration, or other signs of local infection.   Skin prepped in a sterile fashion.   Local anesthesia: Topical Ethyl chloride.   With sterile technique and under real time ultrasound guidance: 40 mg of Kenalog and 2 mL of Marcaine injected into knee joint. Fluid seen entering the joint capsule.   Completed without difficulty   Pain immediately resolved suggesting accurate placement of the medication.   Advised to call if fevers/chills, erythema, induration, drainage, or persistent bleeding.   Images permanently stored and available for review in the ultrasound unit.  Impression: Technically successful ultrasound guided injection.     EXAM: RIGHT KNEE 3 VIEWS   COMPARISON:  December 16, 2008.   FINDINGS: No evidence of fracture, dislocation, or joint effusion. Mild patellofemoral and medial tibiofemoral degenerative  change. Intramedullary sclerotic area in distal femur could reflect bone infarct or enchondroma. Soft tissues are unremarkable.   IMPRESSION: No acute osseous abnormality.  Mild degenerative joint disease.     Electronically Signed   By: Dahlia Bailiff MD   On: 08/10/2020 09:39     EXAM: LEFT KNEE - COMPLETE 4+ VIEW   COMPARISON:  None.   FINDINGS: No acute bony abnormality. Specifically, no fracture, subluxation, or dislocation. Joint spaces maintained. No joint effusion. Sclerotic area in the distal femur could reflect bone infarct or enchondroma.   IMPRESSION: No acute bony abnormality.     Electronically Signed   By: Rolm Baptise M.D.   On: 01/16/2020 17:29  EXAM: MRI LUMBAR SPINE WITHOUT CONTRAST   TECHNIQUE: Multiplanar, multisequence MR imaging of the lumbar spine was performed. No intravenous contrast was administered.   COMPARISON:  None.   FINDINGS: Segmentation:  5 lumbar type vertebrae   Alignment:  Physiologic   Vertebrae:  No fracture, evidence of discitis, or bone lesion.   Conus medullaris and cauda equina: Conus extends to the L1-2 level. Conus and cauda equina appear normal.   Paraspinal and other soft tissues: No evidence of mass or inflammation.   Disc levels:   Well preserved disc height and hydration for age. Mild for age facet spurring at L4-5 and L5-S1. No herniation or neural impingement in the lumbar spine.   IMPRESSION: Less than typical degenerative changes for age. No neural impingement or visible inflammation.     Electronically Signed   By: Monte Fantasia M.D.   On: 10/02/2020 21:41   I, [name], personally (independently) visualized and performed the interpretation of the images attached in this note.     Assessment and Plan: 80 y.o. female with bilateral knee pain due to DJD exacerbation.  Distal leg pain thought not to be due to lumbar radiculopathy and more due to the knee pain.  Patient has done pretty  well with intermittent steroid injections.  Plan for repeat injection today and check back as needed.  Certainly could proceed with hyaluronic acid injections in the future should the steroid injections stopped lasting longer than 3 months however for now continue intermittent steroid injections.  Continue quad strengthening exercises and Voltaren gel and recheck back as needed.   PDMP not reviewed this encounter. Orders Placed This Encounter  Procedures   [place] LIMITED JOINT SPACE STRUCTURES LOW BILAT(NO LINKED CHARGES)    Standing Status:   Future    Number of Occurrences:   1    Standing Expiration Date:   06/22/2021    Order Specific Question:   Reason for Exam (SYMPTOM  OR DIAGNOSIS REQUIRED)    Answer:   bilateral knee pain    Order Specific Question:   Preferred imaging location?    Answer:   [facility]   No orders of the defined types were placed in this encounter.    Discussed warning signs or symptoms. Please see discharge instructions. Patient expresses understanding.   The above documentation has been reviewed and is accurate and complete [name], M.D.

## 2020-12-22 NOTE — Progress Notes (Signed)
Subjective:  Patient ID: [name], female    DOB: [birth year]  Age: 80 y.o. MRN: [id number]  CC: Hypertension, Diabetes, and Hypothyroidism  This visit occurred during the SARS-CoV-2 public health emergency.  Safety protocols were in place, including screening questions prior to the visit, additional usage of staff PPE, and extensive cleaning of exam room while observing appropriate contact time as indicated for disinfecting solutions.    HPI [name] presents for f/up -  She complains of insomnia with difficulty falling asleep.  She tells me that the lack of sleep causes a headache the next day.  She also complains of decreased hearing and says her hearing aids are not effective.  Outpatient Medications Prior to Visit  Medication Sig Dispense Refill   Blood Glucose Calibration (ACCU-CHEK GUIDE CONTROL) LIQD 1 Act by In Vitro route 2 (two) times daily as needed. 1 each 3   Blood Glucose Monitoring Suppl (ACCU-CHEK GUIDE ME) w/Device KIT 1 Act by Does not apply route 2 (two) times daily as needed. 2 kit 0   carvedilol (COREG) 3.125 MG tablet Take 1 tablet (3.125 mg total) by mouth 2 (two) times daily with a meal. 180 tablet 0   diclofenac Sodium (VOLTAREN) 1 % GEL Apply 4 g topically 4 (four) times daily. 100 g 5   diltiazem (TIAZAC) 180 MG 24 hr capsule Take 1 capsule (180 mg total) by mouth daily. 90 capsule 0   FLUoxetine (PROZAC) 20 MG capsule TAKE 1 CAPSULE AT BEDTIME 90 capsule 1   glucose blood (ACCU-CHEK GUIDE) test strip Use as instructed 100 each 5   levothyroxine (SYNTHROID) 50 MCG tablet TAKE 1 TABLET BY MOUTH EVERY DAY 90 tablet 0   losartan (COZAAR) 100 MG tablet Take 1 tablet (100 mg total) by mouth daily. 90 tablet 1   Multiple Vitamins-Minerals (CENTRUM SILVER PO) Take by mouth daily.     potassium chloride SA (KLOR-CON) 20 MEQ tablet Take 1 tablet (20 mEq total) by mouth daily. 90 tablet 0   rosuvastatin (CRESTOR) 10 MG tablet Take 1 tablet (10 mg total) by  mouth daily. 90 tablet 3   triamcinolone cream (KENALOG) 0.5 %      metFORMIN (GLUCOPHAGE-XR) 750 MG 24 hr tablet TAKE 1 TABLET BY MOUTH EVERY DAY WITH BREAKFAST 90 tablet 0   gabapentin (NEURONTIN) 100 MG capsule Take 1-2 capsules (100-200 mg total) by mouth at bedtime as needed. 60 capsule 3   No facility-administered medications prior to visit.    ROS Review of Systems  Constitutional:  Negative for diaphoresis and fatigue.  HENT:  Positive for hearing loss. Negative for sinus pressure.   Eyes: Negative.   Respiratory:  Negative for cough, chest tightness and wheezing.   Cardiovascular:  Negative for chest pain, palpitations and leg swelling.  Gastrointestinal:  Negative for abdominal pain, diarrhea and nausea.  Endocrine: Negative.   Genitourinary: Negative.  Negative for difficulty urinating.  Musculoskeletal: Negative.   Skin: Negative.   Neurological:  Positive for headaches. Negative for dizziness, weakness and light-headedness.  Hematological:  Negative for adenopathy. Does not bruise/bleed easily.  Psychiatric/Behavioral:  Positive for sleep disturbance. Negative for dysphoric mood. The patient is not nervous/anxious.    Objective:  BP 128/76 (BP Location: Left Arm, Patient Position: Sitting, Cuff Size: Large)   Pulse 65   Temp 97.9 F (36.6 C) (Oral)   Resp 16   Ht $R'4\' 11"'KG$  (1.499 m)   Wt 151 lb (68.5 kg)   SpO2 97%   BMI  30.50 kg/m   BP Readings from Last 3 Encounters:  12/22/20 128/76  12/22/20 132/86  10/13/20 126/82    Wt Readings from Last 3 Encounters:  12/22/20 151 lb (68.5 kg)  12/22/20 151 lb 9.6 oz (68.8 kg)  10/13/20 155 lb (70.3 kg)    Physical Exam Vitals reviewed.  HENT:     Right Ear: Tympanic membrane normal. Decreased hearing noted. There is impacted cerumen.     Left Ear: Tympanic membrane normal. Decreased hearing noted. There is impacted cerumen.     Ears:     Comments: I put Colace in both EACs and then I used water and an ear pick  to remove the cerumen.  She tolerated this well.  The examination afterwards shows that the cerumen has been removed and the EACs and TMs are normal.    Mouth/Throat:     Mouth: Mucous membranes are moist.  Eyes:     General: No scleral icterus.    Conjunctiva/sclera: Conjunctivae normal.  Cardiovascular:     Rate and Rhythm: Normal rate and regular rhythm.     Heart sounds: No murmur heard. Pulmonary:     Effort: Pulmonary effort is normal.     Breath sounds: No stridor. No wheezing, rhonchi or rales.  Abdominal:     General: Abdomen is protuberant. Bowel sounds are normal. There is no distension.     Palpations: Abdomen is soft. There is no hepatomegaly.     Tenderness: There is no abdominal tenderness.  Musculoskeletal:        General: Normal range of motion.     Cervical back: Neck supple.     Right lower leg: No edema.     Left lower leg: No edema.  Lymphadenopathy:     Cervical: No cervical adenopathy.  Skin:    General: Skin is warm and dry.  Neurological:     General: No focal deficit present.     Mental Status: She is alert.    Lab Results  Component Value Date   WBC 7.3 12/22/2020   HGB 12.1 12/22/2020   HCT 37.3 12/22/2020   PLT 272.0 12/22/2020   GLUCOSE 93 12/22/2020   CHOL 166 01/19/2020   TRIG 114.0 01/19/2020   HDL 64.80 01/19/2020   LDLDIRECT 120.6 11/24/2012   LDLCALC 79 01/19/2020   ALT 18 03/08/2015   AST 19 03/08/2015   NA 139 12/22/2020   K 4.0 12/22/2020   CL 106 12/22/2020   CREATININE 0.84 12/22/2020   BUN 13 12/22/2020   CO2 22 12/22/2020   TSH 0.43 12/22/2020   INR 1.0 10/01/2011   HGBA1C 5.7 12/22/2020   MICROALBUR 0.8 06/23/2014    MR Brain Wo Contrast  Result Date: 10/31/2020 CLINICAL DATA:  New daily persistent headache G44.52 (ICD-10-CM). Headache, new or worsening (Age >= 50y). Additional history provided by scanning technologist: Patient reports headaches for 8 months. EXAM: MRI HEAD WITHOUT CONTRAST TECHNIQUE: Multiplanar,  multiecho pulse sequences of the brain and surrounding structures were obtained without intravenous contrast. COMPARISON:  Head CT 01/16/2020. FINDINGS: Brain: Mild-to-moderate for age generalized cerebral atrophy. Comparatively mild cerebellar atrophy. Mild multifocal T2/FLAIR hyperintensity within the cerebral white matter, nonspecific but compatible with chronic small vessel ischemic disease. There is no acute infarct. No evidence of an intracranial mass. No chronic intracranial blood products. No extra-axial fluid collection. No midline shift. Vascular: Maintained flow voids within the proximal large arterial vessels. Skull and upper cervical spine: No focal suspicious marrow lesion. Incompletely assessed cervical spondylosis.  Sinuses/Orbits: Visualized orbits show no acute finding. Bilateral lens replacements. Small mucous retention cysts within the right ethmoid air cells. IMPRESSION: No evidence of acute intracranial abnormality. Mild chronic small vessel ischemic changes within the cerebral white matter. Mild-to-moderate generalized cerebral atrophy. Comparatively mild cerebellar atrophy. Small right ethmoid sinus mucous retention cyst. Electronically Signed   By: Kellie Simmering D.O.   On: 10/31/2020 16:03    Assessment & Plan:   Ceazia was seen today for hypertension, diabetes and hypothyroidism.  Diagnoses and all orders for this visit:  Essential hypertension- Her BP is well controlled. -     CBC with Differential/Platelet; Future -     Basic metabolic panel; Future -     Urinalysis, Routine w reflex microscopic; Future -     Urinalysis, Routine w reflex microscopic -     Basic metabolic panel -     CBC with Differential/Platelet  Type II diabetes mellitus with manifestations ([place])- Her A1c is down to 5.7%.  I recommended that she stop taking metformin. -     Basic metabolic panel; Future -     Hemoglobin A1c; Future -     Microalbumin / creatinine urine ratio; Future -     Microalbumin  / creatinine urine ratio -     Hemoglobin A1c -     Basic metabolic panel  Acquired hypothyroidism- Her TSH is in the normal range. -     TSH; Future -     TSH  Stage 3b chronic kidney disease ([place])- Her renal function is stable.  She will avoid nephrotoxic agents. -     CBC with Differential/Platelet; Future -     Basic metabolic panel; Future -     Urinalysis, Routine w reflex microscopic; Future -     Microalbumin / creatinine urine ratio; Future -     Microalbumin / creatinine urine ratio -     Urinalysis, Routine w reflex microscopic -     Basic metabolic panel -     CBC with Differential/Platelet  Psychophysiological insomnia -     Suvorexant (BELSOMRA) 15 MG TABS; Take 15 mg by mouth at bedtime as needed.  I have discontinued [name]'s gabapentin and metFORMIN. I am also having her start on Belsomra. Additionally, I am having her maintain her Multiple Vitamins-Minerals (CENTRUM SILVER PO), diclofenac Sodium, Accu-Chek Guide Me, Accu-Chek Guide, carvedilol, losartan, Accu-Chek Guide Control, FLUoxetine, triamcinolone cream, rosuvastatin, diltiazem, potassium chloride SA, and levothyroxine.  Meds ordered this encounter  Medications   Suvorexant (BELSOMRA) 15 MG TABS    Sig: Take 15 mg by mouth at bedtime as needed.    Dispense:  90 tablet    Refill:  1      Follow-up: No follow-ups on file.  Gina Calico, MD

## 2020-12-23 DIAGNOSIS — H6123 Impacted cerumen, bilateral: Secondary | ICD-10-CM | POA: Insufficient documentation

## 2020-12-23 NOTE — Assessment & Plan Note (Signed)
Hearing improved after the cerumen was removed.

## 2021-01-04 ENCOUNTER — Ambulatory Visit: Payer: Medicare HMO | Admitting: Family Medicine

## 2021-01-04 NOTE — Progress Notes (Deleted)
   I, [name], LAT, ATC acting as a scribe for Gina Leader, MD.  [name] is a 80 y.o. female who presents to [place] at [facility] today for R wrist and hand pain/swelling. Pt was previously seen by Dr. Georgina Snell on 12/22/20 for bilat chronic knee pain and was given bilat knee steroid injections. Today, pt c/o R wrist and hand pain/swelling ongoing since /. Pt locates pain to   Pertinent review of systems: ***  Relevant historical information: ***   Exam:  There were no vitals taken for this visit. General: Well Developed, well nourished, and in no acute distress.   MSK: ***    Lab and Radiology Results No results found for this or any previous visit (from the past 72 hour(s)). No results found.     Assessment and Plan: 80 y.o. female with ***   PDMP not reviewed this encounter. No orders of the defined types were placed in this encounter.  No orders of the defined types were placed in this encounter.    Discussed warning signs or symptoms. Please see discharge instructions. Patient expresses understanding.   ***

## 2021-01-05 ENCOUNTER — Ambulatory Visit (INDEPENDENT_AMBULATORY_CARE_PROVIDER_SITE_OTHER): Payer: Medicare HMO | Admitting: Family Medicine

## 2021-01-05 ENCOUNTER — Other Ambulatory Visit (INDEPENDENT_AMBULATORY_CARE_PROVIDER_SITE_OTHER): Payer: Medicare HMO

## 2021-01-05 ENCOUNTER — Other Ambulatory Visit: Payer: Self-pay

## 2021-01-05 ENCOUNTER — Ambulatory Visit (INDEPENDENT_AMBULATORY_CARE_PROVIDER_SITE_OTHER): Payer: Medicare HMO

## 2021-01-05 ENCOUNTER — Telehealth: Payer: Self-pay | Admitting: Internal Medicine

## 2021-01-05 ENCOUNTER — Ambulatory Visit: Payer: Self-pay

## 2021-01-05 VITALS — BP 144/90 | HR 63 | Ht 59.0 in | Wt 150.8 lb

## 2021-01-05 DIAGNOSIS — N1832 Chronic kidney disease, stage 3b: Secondary | ICD-10-CM | POA: Diagnosis not present

## 2021-01-05 DIAGNOSIS — I10 Essential (primary) hypertension: Secondary | ICD-10-CM

## 2021-01-05 DIAGNOSIS — M79641 Pain in right hand: Secondary | ICD-10-CM | POA: Diagnosis not present

## 2021-01-05 DIAGNOSIS — S6991XA Unspecified injury of right wrist, hand and finger(s), initial encounter: Secondary | ICD-10-CM | POA: Diagnosis not present

## 2021-01-05 DIAGNOSIS — M7989 Other specified soft tissue disorders: Secondary | ICD-10-CM | POA: Diagnosis not present

## 2021-01-05 DIAGNOSIS — M25531 Pain in right wrist: Secondary | ICD-10-CM | POA: Diagnosis not present

## 2021-01-05 LAB — URINALYSIS, ROUTINE W REFLEX MICROSCOPIC
Bilirubin Urine: NEGATIVE
Hgb urine dipstick: NEGATIVE
Ketones, ur: NEGATIVE
Leukocytes,Ua: NEGATIVE
Nitrite: NEGATIVE
RBC / HPF: NONE SEEN (ref 0–?)
Specific Gravity, Urine: 1.02 (ref 1.000–1.030)
Urine Glucose: NEGATIVE
Urobilinogen, UA: 0.2 (ref 0.0–1.0)
pH: 6 (ref 5.0–8.0)

## 2021-01-05 LAB — MICROALBUMIN / CREATININE URINE RATIO
Creatinine,U: 122.1 mg/dL
Microalb Creat Ratio: 1 mg/g (ref 0.0–30.0)
Microalb, Ur: 1.2 mg/dL (ref 0.0–1.9)

## 2021-01-05 LAB — BASIC METABOLIC PANEL
BUN: 18 mg/dL (ref 6–23)
CO2: 24 mEq/L (ref 19–32)
Calcium: 9.1 mg/dL (ref 8.4–10.5)
Chloride: 110 mEq/L (ref 96–112)
Creatinine, Ser: 0.83 mg/dL (ref 0.40–1.20)
GFR: 66.04 mL/min (ref >60.00)
Glucose, Bld: 103 mg/dL — ABNORMAL HIGH (ref 70–99)
Potassium: 3.3 mEq/L — ABNORMAL LOW (ref 3.5–5.1)
Sodium: 141 mEq/L (ref 135–145)

## 2021-01-05 LAB — URIC ACID: Uric Acid, Serum: 3.1 mg/dL (ref 2.4–7.0)

## 2021-01-05 NOTE — Progress Notes (Signed)
I, [name], LAT, ATC acting as a scribe for [name], MD.  [name] is a 80 y.o. female who presents to [place] at [facility] today for R hand and wrist pain/swelling. Pt was last seen by Dr. Georgina Snell on 12/22/20 for bilat knee pain and was given bilat knee steroid injection. Today, pt report R hand pain and swelling ongoing since Monday, 10/17, w/ no MOI. Pt locates pain and a large "nodule" over the R 2-3 MC w/ pain over her distal forearm and wrist.  Grip strength: yes Aggravates: playing on her phone Treatments tried: Voltaren gel, compression glove  Pertinent review of systems: No fevers or chills  Relevant historical information: Controlled diabetes.  CKD 3.  No known history of gout.   Exam:  BP (!) 144/90   Pulse 63   Ht 4\' 11"  (1.499 m)   Wt 150 lb 12.8 oz (68.4 kg)   SpO2 99%   BMI 30.46 kg/m  General: Well Developed, well nourished, and in no acute distress.   MSK: Right hand swelling over second and third MCPs.  Decreased hand motion to flexion with pain.  Tender palpation second third MCPs. Dorsal wrist mildly painful.  Normal wrist motion.    Lab and Radiology Results  Diagnostic Limited MSK Ultrasound of: Right hand Mild wrist effusion dorsal wrist Bony structures dorsal wrist otherwise normal-appearing Mild effusion second and third MCPs dorsal.  Intact extensor tendons.  Bony structures otherwise normal-appearing Impression: Mild wrist effusion and mild joint effusion second and third MCPs.   X-ray images right hand obtained today personally and independently interpreted DJD radial wrist.  No severe or significant DJD second and third MCP.  No acute fractures. Await formal radiology review   Assessment and Plan: 80 y.o. female with right hand pain and swelling at second and third MCPs.  Etiology is unclear.  Obtain x-ray and uric acid today to help evaluate source of pain.  I offered injection and [name] wanted to wait  until we have a better sense of what is wrong before proceeding with more interventional treatment.  I think this is reasonable.  Plan to proceed with further work-up and check back in a week.  For now Voltaren gel and Tylenol.  Discussed safety of prescription strength NSAIDs.   PDMP not reviewed this encounter. Orders Placed This Encounter  Procedures   [place] LIMITED JOINT SPACE STRUCTURES UP RIGHT(NO LINKED CHARGES)    Standing Status:   Future    Number of Occurrences:   1    Standing Expiration Date:   07/06/2021    Order Specific Question:   Reason for Exam (SYMPTOM  OR DIAGNOSIS REQUIRED)    Answer:   right hand pain    Order Specific Question:   Preferred imaging location?    Answer:   [place]   DG Hand Complete Right    Standing Status:   Future    Number of Occurrences:   1    Standing Expiration Date:   01/05/2022    Order Specific Question:   Reason for Exam (SYMPTOM  OR DIAGNOSIS REQUIRED)    Answer:   right hand pain    Order Specific Question:   Preferred imaging location?    Answer:   [facility]   Uric acid    Standing Status:   Future    Number of Occurrences:   1    Standing Expiration Date:   01/05/2022   Basic  Metabolic Panel (BMET)   No orders of the defined types were placed in this encounter.    Discussed warning signs or symptoms. Please see discharge instructions. Patient expresses understanding.   The above documentation has been reviewed and is accurate and complete [name], M.D.

## 2021-01-05 NOTE — Patient Instructions (Addendum)
Thank you for coming in today.   Please get an Xray today before you leave   Please get labs today before you leave   Recheck back in 1 week.

## 2021-01-05 NOTE — Addendum Note (Signed)
Addended by: Azzie Almas on: 01/05/2021 04:34 PM   Modules accepted: Orders

## 2021-01-05 NOTE — Telephone Encounter (Signed)
Patient think she needs hearing aids. She found a article through her insurance that shows they would possibly cover them. Article is placed in provider's box. She would to discuss the need further. Are you able to put in a referral for audiology?   Please advise.

## 2021-01-06 NOTE — Progress Notes (Signed)
Uric acid level is normal.  No evidence of gout.  Kidney function is stable.  Potassium is a tad low.

## 2021-01-07 DIAGNOSIS — G4733 Obstructive sleep apnea (adult) (pediatric): Secondary | ICD-10-CM | POA: Diagnosis not present

## 2021-01-09 ENCOUNTER — Telehealth: Payer: Self-pay | Admitting: Internal Medicine

## 2021-01-09 NOTE — Telephone Encounter (Signed)
Patient is requesting pneumonia vaccination  Patient is requesting vaccination on 11-2-202  Please advise

## 2021-01-09 NOTE — Progress Notes (Signed)
Right hand x-ray shows some soft tissue swelling but otherwise looks okay to radiology

## 2021-01-10 ENCOUNTER — Ambulatory Visit
Admission: RE | Admit: 2021-01-10 | Discharge: 2021-01-10 | Disposition: A | Discharge status: Home / Self Care | Payer: Medicare HMO | Source: Ambulatory Visit | Attending: Internal Medicine | Admitting: Internal Medicine

## 2021-01-10 ENCOUNTER — Other Ambulatory Visit: Payer: Self-pay

## 2021-01-10 DIAGNOSIS — Z1231 Encounter for screening mammogram for malignant neoplasm of breast: Secondary | ICD-10-CM

## 2021-01-12 ENCOUNTER — Institutional Professional Consult (permissible substitution): Payer: Medicare HMO | Admitting: Pulmonary Disease

## 2021-01-16 NOTE — Progress Notes (Signed)
I, Wendy Poet, LAT, ATC, am serving as scribe for Dr. [name].  [name] is a 80 y.o. female who presents to [place] at [facility] today for f/u of R hand (2nd/3rd MC) and wrist pain.  She was last seen by Dr. Georgina Snell on 01/05/21 and was advised to use Voltaren gel and Tylenol and f/u in 1 week.  Today, pt reports that her R hand and wrist pain is unchanged.  She states that she needs some type of support for her wrist.  She tried the diclofenac and that did not help.    She lives at [facility] assisted living and does have access to occupational therapy. She would like to avoid an injection if possible.  On a different note, she would like an order for a new walker that is not so heavy and difficult to transport.  Diagnostic testing: R hand XR- 01/05/21  Pertinent review of systems: No fevers or chills  Relevant historical information: Diabetes   Exam:  BP 128/84 (BP Location: Right Arm, Patient Position: Sitting, Cuff Size: Normal)   Pulse 60   Ht 4\' 11"  (1.499 m)   Wt 149 lb 3.2 oz (67.7 kg)   SpO2 97%   BMI 30.13 kg/m  General: Well Developed, well nourished, and in no acute distress.   MSK: Right wrist mild swelling right wrist and second and third MCP.  Mildly tender palpation wrist and second and third MCP.    Lab and Radiology Results  EXAM: RIGHT HAND - COMPLETE 3+ VIEW   COMPARISON:  None.   FINDINGS: There is no evidence of an acute fracture or dislocation. There is no evidence of arthropathy or other focal bone abnormality. Moderate severity soft tissue swelling is seen along the distal right forearm.   IMPRESSION: 1. No acute osseous abnormality. 2. Soft tissue swelling along the distal right forearm.     Electronically Signed   By: Virgina Norfolk M.D.   On: 01/06/2021 20:43 I, [name], personally (independently) visualized and performed the interpretation of the images attached in this  note.      Assessment and Plan: 80 y.o. female with left hand and wrist pain ongoing for over a month now.  Etiology is somewhat unclear.  X-ray and lab work-up so far has been unrevealing.  Obvious neck step could be steroid injection although patient would like to avoid that if possible which I do think is reasonable.  Proceed with trial of occupational therapy at [facility] assisted living.  Additionally she mentions that she could use a lighter folding walker.  She does have a heavy quad wheeled walker that she uses most the time but when she travels she cannot lift that heavy walker in and out of her car.  We will write an order for a light folding wheel walker which I think would allow her greater mobility.  Recheck in 6 -8 weeks.  On March 24, 2021 in about a week she could have a knee injection at that time if needed so we may want to consolidate that visit.   PDMP not reviewed this encounter. Orders Placed This Encounter  Procedures   Ambulatory referral to Occupational Therapy    Referral Priority:   Routine    Referral Type:   Occupational Therapy    Referral Reason:   Specialty Services Required    Requested Specialty:   Occupational Therapy    Number of Visits Requested:   1   Meds ordered this  encounter  Medications   AMBULATORY NON FORMULARY MEDICATION    Sig: Folding walker with front wheels.  Use as needed. Disp 1 R26.89    Dispense:  1 each    Refill:  0     Discussed warning signs or symptoms. Please see discharge instructions. Patient expresses understanding.   The above documentation has been reviewed and is accurate and complete [name], M.D.   Total encounter time 30 minutes including face-to-face time with the patient and, reviewing past medical record, and charting on the date of service.   Discussion treatment plan for hand right hand and wrist as well as her walker.

## 2021-01-17 ENCOUNTER — Other Ambulatory Visit: Payer: Self-pay

## 2021-01-17 ENCOUNTER — Encounter: Payer: Self-pay | Admitting: Family Medicine

## 2021-01-17 ENCOUNTER — Ambulatory Visit: Payer: Medicare HMO | Admitting: Family Medicine

## 2021-01-17 ENCOUNTER — Telehealth: Payer: Self-pay | Admitting: Internal Medicine

## 2021-01-17 ENCOUNTER — Ambulatory Visit: Payer: Self-pay

## 2021-01-17 VITALS — BP 128/84 | HR 60 | Ht 59.0 in | Wt 149.2 lb

## 2021-01-17 DIAGNOSIS — M25531 Pain in right wrist: Secondary | ICD-10-CM

## 2021-01-17 DIAGNOSIS — R2689 Other abnormalities of gait and mobility: Secondary | ICD-10-CM | POA: Insufficient documentation

## 2021-01-17 DIAGNOSIS — M79641 Pain in right hand: Secondary | ICD-10-CM

## 2021-01-17 MED ORDER — AMBULATORY NON FORMULARY MEDICATION: 0 refills | Status: DC

## 2021-01-17 NOTE — Telephone Encounter (Signed)
Pt. Dropped off copy of COVID-19 records. Also, wanted to let provider know she is losing weight.

## 2021-01-17 NOTE — Patient Instructions (Addendum)
Good to see you today.  I written a prescription for a new walker that you will need to take to a medical supply store like [facility] [phone] [place], [place] [address]).  I've referred you to Occupational Therapy at your assisted living facility.  Follow-up: 6 weeks

## 2021-01-18 ENCOUNTER — Ambulatory Visit: Payer: Medicare HMO

## 2021-01-18 ENCOUNTER — Ambulatory Visit: Payer: Medicare HMO | Admitting: Family Medicine

## 2021-01-19 ENCOUNTER — Telehealth: Payer: Self-pay | Admitting: Family Medicine

## 2021-01-19 DIAGNOSIS — M79641 Pain in right hand: Secondary | ICD-10-CM

## 2021-01-19 DIAGNOSIS — G8929 Other chronic pain: Secondary | ICD-10-CM

## 2021-01-19 DIAGNOSIS — R2689 Other abnormalities of gait and mobility: Secondary | ICD-10-CM

## 2021-01-19 DIAGNOSIS — M25562 Pain in left knee: Secondary | ICD-10-CM

## 2021-01-19 DIAGNOSIS — M25531 Pain in right wrist: Secondary | ICD-10-CM

## 2021-01-19 NOTE — Telephone Encounter (Signed)
New order for home health physical therapy and Occupational Therapy ordered today.

## 2021-01-19 NOTE — Telephone Encounter (Signed)
Referral sent to [facility] for OT. Levada Dy (with [facility]) said that due to insurance coverage, home health cannot only do OT. They must also have another service to initiate the start of care. Levada Dy asked if Dr Georgina Snell would put in an order for PT as well? Even if the therapist does not think patent is a full candidate for PT, they would still need to assess the patient which will begin the start of care and allow OT to see the patient as well.

## 2021-01-19 NOTE — Telephone Encounter (Signed)
Informed Angela with [facility].

## 2021-02-07 ENCOUNTER — Telehealth: Payer: Self-pay | Admitting: Internal Medicine

## 2021-02-07 ENCOUNTER — Other Ambulatory Visit: Payer: Self-pay | Admitting: Internal Medicine

## 2021-02-07 DIAGNOSIS — I10 Essential (primary) hypertension: Secondary | ICD-10-CM

## 2021-02-07 MED ORDER — CARVEDILOL 3.125 MG PO TABS: 3.1250 mg | ORAL_TABLET | Freq: Two times a day (BID) | ORAL | 1 refills | Status: DC

## 2021-02-07 NOTE — Telephone Encounter (Signed)
Pt called to advise she is out of her carvedilol. Pharmacy states they have contacted PCP for refill.

## 2021-02-07 NOTE — Telephone Encounter (Signed)
RX refilled by PCP.

## 2021-02-14 ENCOUNTER — Telehealth: Payer: Self-pay | Admitting: Internal Medicine

## 2021-02-14 NOTE — Telephone Encounter (Signed)
Called pt, LVM.   

## 2021-02-14 NOTE — Telephone Encounter (Signed)
Patient states she is having excessive coughing and fatigue  Patient declined appt with another [facility] provider due to her provider not having ov available   Patient is requesting a call back

## 2021-02-15 DIAGNOSIS — R0602 Shortness of breath: Secondary | ICD-10-CM | POA: Diagnosis not present

## 2021-02-15 DIAGNOSIS — J0100 Acute maxillary sinusitis, unspecified: Secondary | ICD-10-CM | POA: Diagnosis not present

## 2021-02-15 DIAGNOSIS — R051 Acute cough: Secondary | ICD-10-CM | POA: Diagnosis not present

## 2021-02-15 DIAGNOSIS — Z20822 Contact with and (suspected) exposure to covid-19: Secondary | ICD-10-CM | POA: Diagnosis not present

## 2021-02-17 ENCOUNTER — Telehealth: Payer: Self-pay | Admitting: Internal Medicine

## 2021-02-17 DIAGNOSIS — G4733 Obstructive sleep apnea (adult) (pediatric): Secondary | ICD-10-CM

## 2021-02-17 NOTE — Telephone Encounter (Signed)
Patient states [facility] pulmonary is requesting a new referral  Please advise

## 2021-02-20 ENCOUNTER — Other Ambulatory Visit: Payer: Self-pay

## 2021-02-20 ENCOUNTER — Ambulatory Visit (INDEPENDENT_AMBULATORY_CARE_PROVIDER_SITE_OTHER): Payer: Medicare HMO | Admitting: Internal Medicine

## 2021-02-20 ENCOUNTER — Encounter: Payer: Self-pay | Admitting: Internal Medicine

## 2021-02-20 VITALS — BP 146/92 | HR 69 | Temp 98.5°F | Resp 16 | Ht 59.0 in | Wt 151.0 lb

## 2021-02-20 DIAGNOSIS — R052 Subacute cough: Secondary | ICD-10-CM | POA: Diagnosis not present

## 2021-02-20 DIAGNOSIS — J0101 Acute recurrent maxillary sinusitis: Secondary | ICD-10-CM | POA: Insufficient documentation

## 2021-02-20 LAB — SARS-COV-2 IGG: SARS-COV-2 IgG: 8.38

## 2021-02-20 LAB — CBC WITH DIFFERENTIAL/PLATELET
Basophils Absolute: 0.1 10*3/uL (ref 0.0–0.1)
Basophils Relative: 0.9 % (ref 0.0–3.0)
Eosinophils Absolute: 0 10*3/uL (ref 0.0–0.7)
Eosinophils Relative: 0.4 % (ref 0.0–5.0)
HCT: 35.1 % — ABNORMAL LOW (ref 36.0–46.0)
Hemoglobin: 11.4 g/dL — ABNORMAL LOW (ref 12.0–15.0)
Lymphocytes Relative: 15.7 % (ref 12.0–46.0)
Lymphs Abs: 1.1 10*3/uL (ref 0.7–4.0)
MCHC: 32.3 g/dL (ref 30.0–36.0)
MCV: 95.5 fl (ref 78.0–100.0)
Monocytes Absolute: 0.9 10*3/uL (ref 0.1–1.0)
Monocytes Relative: 12.2 % — ABNORMAL HIGH (ref 3.0–12.0)
Neutro Abs: 4.9 10*3/uL (ref 1.4–7.7)
Neutrophils Relative %: 70.8 % (ref 43.0–77.0)
Platelets: 205 10*3/uL (ref 150.0–400.0)
RBC: 3.68 Mil/uL — ABNORMAL LOW (ref 3.87–5.11)
RDW: 15.4 % (ref 11.5–15.5)
WBC: 7 10*3/uL (ref 4.0–10.5)

## 2021-02-20 MED ORDER — SULFAMETHOXAZOLE-TRIMETHOPRIM 800-160 MG PO TABS
1.0000 | ORAL_TABLET | Freq: Two times a day (BID) | ORAL | 0 refills | Status: AC
Start: 2021-02-20 — End: 2021-03-02

## 2021-02-20 NOTE — Progress Notes (Signed)
Subjective:  Patient ID: [name], female    DOB: 10-09-1940  Age: 80 y.o. MRN: [id number]  CC: Cough and Sinusitis  This visit occurred during the SARS-CoV-2 public health emergency.  Safety protocols were in place, including screening questions prior to the visit, additional usage of staff PPE, and extensive cleaning of exam room while observing appropriate contact time as indicated for disinfecting solutions.    HPI [name] presents for f/up -   She developed sore throat, nonproductive cough, and chills about a week ago.  She tells me that 3 days ago she was seen at an urgent care center and her COVID antigen was negative, her flu test was negative, and her chest x-ray was normal.  She has been prescribed doxycycline but complains of worsening left facial pain and runny nose.   Outpatient Medications Prior to Visit  Medication Sig Dispense Refill   AMBULATORY NON FORMULARY MEDICATION Folding walker with front wheels.  Use as needed. Disp 1 R26.89 1 each 0   Blood Glucose Calibration (ACCU-CHEK GUIDE CONTROL) LIQD 1 Act by In Vitro route 2 (two) times daily as needed. 1 each 3   Blood Glucose Monitoring Suppl (ACCU-CHEK GUIDE ME) w/Device KIT 1 Act by Does not apply route 2 (two) times daily as needed. 2 kit 0   carvedilol (COREG) 3.125 MG tablet Take 1 tablet (3.125 mg total) by mouth 2 (two) times daily with a meal. 180 tablet 1   diclofenac Sodium (VOLTAREN) 1 % GEL Apply 4 g topically 4 (four) times daily. 100 g 5   diltiazem (TIAZAC) 180 MG 24 hr capsule Take 1 capsule (180 mg total) by mouth daily. 90 capsule 0   FLUoxetine (PROZAC) 20 MG capsule TAKE 1 CAPSULE AT BEDTIME 90 capsule 1   glucose blood (ACCU-CHEK GUIDE) test strip Use as instructed 100 each 5   levothyroxine (SYNTHROID) 50 MCG tablet TAKE 1 TABLET BY MOUTH EVERY DAY 90 tablet 0   losartan (COZAAR) 100 MG tablet Take 1 tablet (100 mg total) by mouth daily. 90 tablet 1   Multiple Vitamins-Minerals  (CENTRUM SILVER PO) Take by mouth daily.     potassium chloride SA (KLOR-CON) 20 MEQ tablet Take 1 tablet (20 mEq total) by mouth daily. 90 tablet 0   rosuvastatin (CRESTOR) 10 MG tablet Take 1 tablet (10 mg total) by mouth daily. 90 tablet 3   Suvorexant (BELSOMRA) 15 MG TABS Take 15 mg by mouth at bedtime as needed. 90 tablet 1   triamcinolone cream (KENALOG) 0.5 %      No facility-administered medications prior to visit.    ROS Review of Systems  Constitutional:  Positive for chills. Negative for fatigue, fever and unexpected weight change.  HENT:  Positive for sore throat. Negative for ear pain and facial swelling.   Eyes: Negative.   Respiratory:  Positive for cough. Negative for shortness of breath and wheezing.   Cardiovascular:  Negative for chest pain, palpitations and leg swelling.  Gastrointestinal:  Negative for abdominal pain, constipation, diarrhea, nausea and vomiting.  Endocrine: Negative.   Genitourinary: Negative.  Negative for difficulty urinating.  Musculoskeletal: Negative.   Skin: Negative.  Negative for rash.  Neurological: Negative.  Negative for dizziness and weakness.  Hematological:  Negative for adenopathy. Does not bruise/bleed easily.  Psychiatric/Behavioral: Negative.     Objective:  BP (!) 146/92 (BP Location: Right Arm, Patient Position: Sitting, Cuff Size: Large)   Pulse 69   Temp 98.5 F (36.9 C) (Oral)  Resp 16   Ht _0  (1.499 m)   Wt 151 lb (68.5 kg)   SpO2 94%   BMI 30.50 kg/m   BP Readings from Last 3 Encounters:  02/20/21 (!) 146/92  01/17/21 128/84  01/05/21 (!) 144/90    Wt Readings from Last 3 Encounters:  02/20/21 151 lb (68.5 kg)  01/17/21 149 lb 3.2 oz (67.7 kg)  01/05/21 150 lb 12.8 oz (68.4 kg)    Physical Exam Vitals reviewed.  HENT:     Right Ear: Tympanic membrane normal.     Left Ear: Tympanic membrane normal.     Nose: Rhinorrhea present. Rhinorrhea is purulent.     Right Sinus: No maxillary sinus  tenderness or frontal sinus tenderness.     Left Sinus: Maxillary sinus tenderness present. No frontal sinus tenderness.     Mouth/Throat:     Mouth: Mucous membranes are moist.  Eyes:     General: No scleral icterus.    Conjunctiva/sclera: Conjunctivae normal.  Cardiovascular:     Rate and Rhythm: Normal rate and regular rhythm.     Heart sounds: No murmur heard. Pulmonary:     Effort: Pulmonary effort is normal.     Breath sounds: No stridor. No wheezing, rhonchi or rales.  Abdominal:     General: Abdomen is flat.     Palpations: There is no mass.     Tenderness: There is no abdominal tenderness. There is no guarding.  Musculoskeletal:        General: Normal range of motion.     Cervical back: Neck supple.     Right lower leg: No edema.     Left lower leg: No edema.  Lymphadenopathy:     Cervical: No cervical adenopathy.  Skin:    General: Skin is warm and dry.  Neurological:     General: No focal deficit present.     Mental Status: She is alert.  Psychiatric:        Mood and Affect: Mood normal.        Behavior: Behavior normal.    Lab Results  Component Value Date   WBC 7.0 02/20/2021   HGB 11.4 (L) 02/20/2021   HCT 35.1 (L) 02/20/2021   PLT 205.0 02/20/2021   GLUCOSE 103 (H) 01/05/2021   CHOL 166 01/19/2020   TRIG 114.0 01/19/2020   HDL 64.80 01/19/2020   LDLDIRECT 120.6 11/24/2012   LDLCALC 79 01/19/2020   ALT 18 03/08/2015   AST 19 03/08/2015   NA 141 01/05/2021   K 3.3 (L) 01/05/2021   CL 110 01/05/2021   CREATININE 0.83 01/05/2021   BUN 18 01/05/2021   CO2 24 01/05/2021   TSH 0.43 12/22/2020   INR 1.0 10/01/2011   HGBA1C 5.7 12/22/2020   MICROALBUR 1.2 01/05/2021    MM 3D SCREEN BREAST BILATERAL  Result Date: 01/12/2021 CLINICAL DATA:  Screening. EXAM: DIGITAL SCREENING BILATERAL MAMMOGRAM WITH TOMOSYNTHESIS AND CAD TECHNIQUE: Bilateral screening digital craniocaudal and mediolateral oblique mammograms were obtained. Bilateral screening digital  breast tomosynthesis was performed. The images were evaluated with computer-aided detection. COMPARISON:  Previous exam(s). ACR Breast Density Category c: The breast tissue is heterogeneously dense, which may obscure small masses. FINDINGS: There are no findings suspicious for malignancy. IMPRESSION: No mammographic evidence of malignancy. A result letter of this screening mammogram will be mailed directly to the patient. RECOMMENDATION: Screening mammogram in one year. (Code:SM-B-01Y) BI-RADS CATEGORY  1: Negative. Electronically Signed   By: Cleatis Polka.D.  On: 01/12/2021 16:19    Assessment & Plan:   Lyrika was seen today for cough and sinusitis.  Diagnoses and all orders for this visit:  Subacute cough- Her COVID antibody is very mildly elevated.  Her CBC is reassuring.  Will treat for acute bacterial sinusitis that is resistant to doxycycline.  She does not tolerate penicillins or amoxicillin. -     CBC with Differential/Platelet; Future -     SARS-COV-2 IgG; Future -     SARS-COV-2 IgG -     CBC with Differential/Platelet  Acute recurrent maxillary sinusitis -     sulfamethoxazole-trimethoprim (BACTRIM DS) 800-160 MG tablet; Take 1 tablet by mouth 2 (two) times daily for 10 days.  I am having Sanyla Mcgahee start on sulfamethoxazole-trimethoprim. I am also having her maintain her Multiple Vitamins-Minerals (CENTRUM SILVER PO), diclofenac Sodium, Accu-Chek Guide Me, Accu-Chek Guide, losartan, Accu-Chek Guide Control, FLUoxetine, triamcinolone cream, rosuvastatin, diltiazem, potassium chloride SA, levothyroxine, Belsomra, AMBULATORY NON FORMULARY MEDICATION, and carvedilol.  Meds ordered this encounter  Medications   sulfamethoxazole-trimethoprim (BACTRIM DS) 800-160 MG tablet    Sig: Take 1 tablet by mouth 2 (two) times daily for 10 days.    Dispense:  20 tablet    Refill:  0      Follow-up: No follow-ups on file.  Scarlette Calico, MD

## 2021-02-21 ENCOUNTER — Encounter: Payer: Self-pay | Admitting: Internal Medicine

## 2021-02-23 ENCOUNTER — Ambulatory Visit: Payer: Medicare HMO | Admitting: Family Medicine

## 2021-02-23 ENCOUNTER — Ambulatory Visit: Payer: Medicare HMO | Admitting: Internal Medicine

## 2021-02-25 ENCOUNTER — Other Ambulatory Visit: Payer: Self-pay | Admitting: Internal Medicine

## 2021-02-25 DIAGNOSIS — I10 Essential (primary) hypertension: Secondary | ICD-10-CM

## 2021-03-02 ENCOUNTER — Institutional Professional Consult (permissible substitution): Payer: Medicare HMO | Admitting: Internal Medicine

## 2021-03-16 ENCOUNTER — Ambulatory Visit: Payer: Self-pay

## 2021-03-16 ENCOUNTER — Ambulatory Visit: Payer: Medicare HMO | Admitting: Family Medicine

## 2021-03-16 ENCOUNTER — Other Ambulatory Visit: Payer: Self-pay

## 2021-03-16 ENCOUNTER — Encounter: Payer: Self-pay | Admitting: Family Medicine

## 2021-03-16 VITALS — BP 120/82 | HR 71 | Ht 59.0 in | Wt 146.4 lb

## 2021-03-16 DIAGNOSIS — M25562 Pain in left knee: Secondary | ICD-10-CM

## 2021-03-16 DIAGNOSIS — M25561 Pain in right knee: Secondary | ICD-10-CM | POA: Diagnosis not present

## 2021-03-16 DIAGNOSIS — G8929 Other chronic pain: Secondary | ICD-10-CM | POA: Diagnosis not present

## 2021-03-16 DIAGNOSIS — M5416 Radiculopathy, lumbar region: Secondary | ICD-10-CM | POA: Diagnosis not present

## 2021-03-16 NOTE — Progress Notes (Signed)
I, Wendy Poet, LAT, ATC, am serving as scribe for Dr. [name].  [name] is a 80 y.o. female who presents to [facility] today for f/u of R hand (2nd/3rd MC) and wrist pain. She lives at [facility] assisted [facility]. Pt was last seen by Dr. Georgina Snell on 01/17/21 and was advised to proceed w/ a trial of OT at her assisted living community and an order was written for a light folding wheel walker. Today, pt reports  that her R wrist and hand are doing a little bit better.  However, she states that she is having B knee pain w/ radiating pain into her B lower legs and into her L thigh and groin.  She notes that her L knee pain is worse than the R.  She was seen previously for her knees on 12/22/20 and had B knee injections.  Dx testing: R knee XR- 08/08/20; L knee XR- 01/16/20             01/05/21 R hand XR  01/05/21 Labs  Pertinent review of systems: No fevers or chills  Relevant historical information: Hypertension.  Diabetes.  CKD 3   Exam:  BP 120/82 (BP Location: Left Arm, Patient Position: Sitting, Cuff Size: Normal)    Pulse 71    Ht 4\' 11"  (1.499 m)    Wt 146 lb 6.4 oz (66.4 kg)    SpO2 (!) 86%    BMI 29.57 kg/m  General: Well Developed, well nourished, and in no acute distress.   MSK: Right knee normal-appearing Normal motion. Tender palpation anterior and posterior knee.  Pain is present in the knee and distally with extension. Stable ligamentous exam. Intact strength.  Left knee normal-appearing Normal motion. Tender palpation anterior and posterior knee.  Stable ligamentous exam. Intact strength.  L-spine: Nontender midline decreased lumbar motion.  Positive slump test bilaterally.     Lab and Radiology Results  Procedure: Real-time Ultrasound Guided Injection of right knee superior lateral patellar space Device: Philips Affiniti 50G Images permanently stored and available for review in PACS Verbal informed consent obtained.   Discussed risks and benefits of procedure. Warned about infection bleeding damage to structures skin hypopigmentation and fat atrophy among others. Patient expresses understanding and agreement Time-out conducted.   Noted no overlying erythema, induration, or other signs of local infection.   Skin prepped in a sterile fashion.   Local anesthesia: Topical Ethyl chloride.   With sterile technique and under real time ultrasound guidance: 40 mg of Kenalog and 2 mL of Marcaine injected into the knee joint. Fluid seen entering the joint capsule.   Completed without difficulty   Pain immediately resolved suggesting accurate placement of the medication.   Advised to call if fevers/chills, erythema, induration, drainage, or persistent bleeding.   Images permanently stored and available for review in the ultrasound unit.  Impression: Technically successful ultrasound guided injection.    Procedure: Real-time Ultrasound Guided Injection of left knee superior lateral patellar space Device: Philips Affiniti 50G Images permanently stored and available for review in PACS Verbal informed consent obtained.  Discussed risks and benefits of procedure. Warned about infection bleeding damage to structures skin hypopigmentation and fat atrophy among others. Patient expresses understanding and agreement Time-out conducted.   Noted no overlying erythema, induration, or other signs of local infection.   Skin prepped in a sterile fashion.   Local anesthesia: Topical Ethyl chloride.   With sterile technique and under real time ultrasound guidance: 40 mg  of Kenalog and 2 mL of Marcaine injected into knee joint. Fluid seen entering the joint capsule.   Completed without difficulty   Pain immediately resolved suggesting accurate placement of the medication.   Advised to call if fevers/chills, erythema, induration, drainage, or persistent bleeding.   Images permanently stored and available for review in the ultrasound  unit.  Impression: Technically successful ultrasound guided injection.  EXAM: LEFT KNEE - COMPLETE 4+ VIEW   COMPARISON:  None.   FINDINGS: No acute bony abnormality. Specifically, no fracture, subluxation, or dislocation. Joint spaces maintained. No joint effusion. Sclerotic area in the distal femur could reflect bone infarct or enchondroma.   IMPRESSION: No acute bony abnormality.     Electronically Signed   By: Rolm Baptise M.D.   On: 01/16/2020 17:29  EXAM: RIGHT KNEE 3 VIEWS   COMPARISON:  December 16, 2008.   FINDINGS: No evidence of fracture, dislocation, or joint effusion. Mild patellofemoral and medial tibiofemoral degenerative change. Intramedullary sclerotic area in distal femur could reflect bone infarct or enchondroma. Soft tissues are unremarkable.   IMPRESSION: No acute osseous abnormality.  Mild degenerative joint disease.     Electronically Signed   By: Dahlia Bailiff MD   On: 08/10/2020 09:39     EXAM: MRI LUMBAR SPINE WITHOUT CONTRAST   TECHNIQUE: Multiplanar, multisequence MR imaging of the lumbar spine was performed. No intravenous contrast was administered.   COMPARISON:  None.   FINDINGS: Segmentation:  5 lumbar type vertebrae   Alignment:  Physiologic   Vertebrae:  No fracture, evidence of discitis, or bone lesion.   Conus medullaris and cauda equina: Conus extends to the L1-2 level. Conus and cauda equina appear normal.   Paraspinal and other soft tissues: No evidence of mass or inflammation.   Disc levels:   Well preserved disc height and hydration for age. Mild for age facet spurring at L4-5 and L5-S1. No herniation or neural impingement in the lumbar spine.   IMPRESSION: Less than typical degenerative changes for age. No neural impingement or visible inflammation.     Electronically Signed   By: Monte Fantasia M.D.   On: 10/02/2020 21:41  I, [name], personally (independently) visualized and performed the  interpretation of the images attached in this note.   Assessment and Plan: 80 y.o. female with knee pain bilaterally thought to be due to some DJD exacerbation.  Plan for bilateral steroid injection.  Certainly could proceed with hyaluronic acid injection in the future if needed.  Additionally she has some pain more distal to the knees that could be lumbar radiculopathy.  Her lumbar MRI from July does not suggest lumbar radiculopathy so this is less likely.  Plan for trial of physical therapy in addition to the steroid injection as above.  Recheck back in 3 months or sooner if needed.     PDMP not reviewed this encounter. Orders Placed This Encounter  Procedures   [place] LIMITED JOINT SPACE STRUCTURES LOW BILAT(NO LINKED CHARGES)    Order Specific Question:   Reason for Exam (SYMPTOM  OR DIAGNOSIS REQUIRED)    Answer:   B knee pain    Order Specific Question:   Preferred imaging location?    Answer:   [facility]   Ambulatory referral to Physical Therapy    Referral Priority:   Routine    Referral Type:   Physical Medicine    Referral Reason:   Specialty Services Required    Requested Specialty:   Physical Therapy  Number of Visits Requested:   1   No orders of the defined types were placed in this encounter.    Discussed warning signs or symptoms. Please see discharge instructions. Patient expresses understanding.  The above documentation has been reviewed and is accurate and complete [name], M.D.   Total encounter time 40 minutes including face-to-face time with the patient and, reviewing past medical record, and charting on the date of service.   Extensive discussion with the patient and her family in the clinic today.  Time-based billing excludes time perform injection.

## 2021-03-16 NOTE — Patient Instructions (Addendum)
Good to see you today.  You had B knee injections.  Call or go to the ER if you develop a large red swollen joint with extreme pain or oozing puss.   I've referred you to Physical Therapy.  That office will call you to schedule but if you haven't heard from them by the end of next week, let us know.  Follow-up: 3 months for B knees or as needed if something else is bothering you.  We can do knee gel injections if you want.  Let us know in advance if you want to do the gel injections so that we can get them authorized with your insurance company.

## 2021-03-24 ENCOUNTER — Other Ambulatory Visit: Payer: Self-pay | Admitting: Internal Medicine

## 2021-03-24 DIAGNOSIS — E118 Type 2 diabetes mellitus with unspecified complications: Secondary | ICD-10-CM

## 2021-03-29 ENCOUNTER — Ambulatory Visit: Payer: Medicare HMO | Admitting: Family Medicine

## 2021-03-29 ENCOUNTER — Ambulatory Visit: Payer: Medicare HMO | Admitting: Internal Medicine

## 2021-03-30 ENCOUNTER — Ambulatory Visit: Payer: Medicare HMO | Admitting: Internal Medicine

## 2021-03-30 ENCOUNTER — Ambulatory Visit: Payer: Medicare HMO | Admitting: Family Medicine

## 2021-03-31 ENCOUNTER — Ambulatory Visit (INDEPENDENT_AMBULATORY_CARE_PROVIDER_SITE_OTHER): Payer: Medicare HMO | Admitting: Pulmonary Disease

## 2021-03-31 ENCOUNTER — Other Ambulatory Visit: Payer: Self-pay

## 2021-03-31 ENCOUNTER — Encounter: Payer: Self-pay | Admitting: Pulmonary Disease

## 2021-03-31 VITALS — BP 126/72 | HR 64 | Ht 59.0 in | Wt 149.8 lb

## 2021-03-31 DIAGNOSIS — G4733 Obstructive sleep apnea (adult) (pediatric): Secondary | ICD-10-CM

## 2021-03-31 NOTE — Progress Notes (Signed)
[name]    [id number]    1940/12/29  Primary Care Physician:Jones, Arvid Right, MD  Referring Physician: Janith Lima, MD [address] [place],  [place] [address]  Chief complaint:   Patient being seen for obstructive sleep apnea  HPI:  Diagnosed with obstructive sleep apnea about 2014 with mild obstructive sleep apnea  She recently relocated back to [facility], was living in [place] She had left her machine in [place] and was not using any CPAP for the last year and a half  She feels that CPAP helps her sleep better and is willing to start using CPAP  She did bring the machine into the office and we were able to start the machine on an auto titrating machine  Supplies are from [facility]  Usually goes to bed about 12:50 AM, falls asleep quickly About 3 awakenings Usual wake up time between 11 and 1 PM  History of hypertension, diabetes, hypercholesterolemia, she does have musculoskeletal pain and discomfort uses a walker for ambulation  Outpatient Encounter Medications as of 03/31/2021  Medication Sig   AMBULATORY NON FORMULARY MEDICATION Folding walker with front wheels.  Use as needed. Disp 1 R26.89   Blood Glucose Calibration (ACCU-CHEK GUIDE CONTROL) LIQD 1 Act by In Vitro route 2 (two) times daily as needed.   Blood Glucose Monitoring Suppl (ACCU-CHEK GUIDE ME) w/Device KIT 1 Act by Does not apply route 2 (two) times daily as needed.   carvedilol (COREG) 3.125 MG tablet Take 1 tablet (3.125 mg total) by mouth 2 (two) times daily with a meal.   diclofenac Sodium (VOLTAREN) 1 % GEL Apply 4 g topically 4 (four) times daily.   FLUoxetine (PROZAC) 20 MG capsule TAKE 1 CAPSULE AT BEDTIME   glucose blood (ACCU-CHEK GUIDE) test strip Use as instructed   KLOR-CON M20 20 MEQ tablet TAKE 1 TABLET BY MOUTH EVERY DAY   levothyroxine (SYNTHROID) 50 MCG tablet TAKE 1 TABLET BY MOUTH EVERY DAY   losartan (COZAAR) 100 MG tablet Take 1 tablet (100 mg total) by mouth  daily.   metFORMIN (GLUCOPHAGE-XR) 750 MG 24 hr tablet TAKE 1 TABLET BY MOUTH EVERY DAY WITH BREAKFAST   Multiple Vitamins-Minerals (CENTRUM SILVER PO) Take by mouth daily.   rosuvastatin (CRESTOR) 10 MG tablet Take 1 tablet (10 mg total) by mouth daily.   Suvorexant (BELSOMRA) 15 MG TABS Take 15 mg by mouth at bedtime as needed.   TIADYLT ER 180 MG 24 hr capsule TAKE 1 CAPSULE BY MOUTH EVERY DAY   triamcinolone cream (KENALOG) 0.5 %    No facility-administered encounter medications on file as of 03/31/2021.    Allergies as of 03/31/2021 - Review Complete 03/31/2021  Allergen Reaction Noted   Amoxicillin Other (See Comments) 06/28/2011   Codeine sulfate     Hydrochlorothiazide     Iodine     Iohexol  02/27/2008   Paroxetine     Penicillins     Prednisone      Past Medical History:  Diagnosis Date   Allergic rhinitis    Anxiety    Arthritis    Chronic cough    Dr Melvyn Novas, onset 11/2007, Sinus CT July 7,2010>neg, Resolved 10/2008 on ppi two times a day   Colon polyp    Depression    Diabetes mellitus type II    diet   Diverticulosis    Dyslipidemia    GERD (gastroesophageal reflux disease)    Hiatal hernia  Hypertension    Hypothyroidism    Lung nodule    Obesity (BMI 30-39.9)    Pneumonia    Sleep apnea    Dr Gwenette Greet CPAP 2009    Past Surgical History:  Procedure Laterality Date   ABDOMINAL HYSTERECTOMY     APPENDECTOMY     BLADDER SUSPENSION     CHOLECYSTECTOMY     1998 in [place]  10/13   and 12/13; cataract surgery   Left breast lumpectomy     NISSEN FUNDOPLICATION     March '11 Dr Johney Maine   RECTOCELE REPAIR     TONSILLECTOMY     x 2   VENTRAL HERNIA REPAIR      Family History  Problem Relation Age of Onset   Stroke Mother 5   Lung cancer Father    Breast cancer Neg Hx     Social History   Socioeconomic History   Marital status: Single    Spouse name: Not on file   Number of children: Not on file   Years of education: Not on  file   Highest education level: Not on file  Occupational History   Occupation: retired Systems analyst  Tobacco Use   Smoking status: Former    Packs/day: 1.50    Years: 25.00    Pack years: 37.50    Types: Cigarettes    Quit date: 10/28/1981    Years since quitting: 39.4   Smokeless tobacco: Never  Substance and Sexual Activity   Alcohol use: No   Drug use: No   Sexual activity: Not Currently  Other Topics Concern   Not on file  Social History Narrative   HSG. Married -divorced. 2 sons. 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 Patient does not get regular exercise. Loves to read. Strong faith. Lives alone.   Social Determinants of Health   Financial Resource Strain: Not on file  Food Insecurity: Not on file  Transportation Needs: Not on file  Physical Activity: Not on file  Stress: Not on file  Social Connections: Not on file  Intimate Partner Violence: Not on file    Review of Systems  Constitutional:  Negative for fatigue.  Respiratory:  Positive for apnea.   Psychiatric/Behavioral:  Positive for sleep disturbance.    Vitals:   03/31/21 1148  BP: 126/72  Pulse: 64  SpO2: 100%     Physical Exam Constitutional:      Appearance: She is obese.  HENT:     Head: Normocephalic.     Mouth/Throat:     Mouth: Mucous membranes are moist.  Cardiovascular:     Rate and Rhythm: Normal rate and regular rhythm.     Heart sounds: No murmur heard.   No friction rub.  Pulmonary:     Effort: No respiratory distress.     Breath sounds: No stridor. No wheezing or rhonchi.  Musculoskeletal:     Cervical back: No rigidity or tenderness.  Neurological:     Mental Status: She is alert.  Psychiatric:        Mood and Affect: Mood normal.   Data Reviewed: No machine download available as she has not been using the machine  Assessment:  Mild obstructive sleep apnea  To restart using  CPAP  Hypertension Diabetes Hypercholesterolemia -Controlled  Plan/Recommendations: I will see her back in 3 months  We will contact [employer] for CPAP supplies  Encouraged to give [place] a call if she has any significant concerns  She  describes significant improvement in symptoms with CPAP use and she is willing to resume use  We may need to make changes to pressures, will get download in a few weeks   Sherrilyn Rist MD [facility] 03/31/2021, 12:18 PM  CC: Janith Lima, MD

## 2021-03-31 NOTE — Patient Instructions (Signed)
Contact [employer] to make sure machine is working well-we will send a prescription for supplies to [facility]  We were able to start the machine in the office today, I will suggest that you start using it on a regular basis so that we can get a download from it in a few weeks  Call with significant concerns  I will see you in about 3 months

## 2021-04-11 ENCOUNTER — Encounter (HOSPITAL_BASED_OUTPATIENT_CLINIC_OR_DEPARTMENT_OTHER): Payer: Self-pay | Admitting: Physical Therapy

## 2021-04-11 ENCOUNTER — Other Ambulatory Visit: Payer: Self-pay

## 2021-04-11 ENCOUNTER — Ambulatory Visit (HOSPITAL_BASED_OUTPATIENT_CLINIC_OR_DEPARTMENT_OTHER): Payer: Medicare HMO | Admitting: Physical Therapy

## 2021-04-11 ENCOUNTER — Ambulatory Visit (HOSPITAL_BASED_OUTPATIENT_CLINIC_OR_DEPARTMENT_OTHER): Payer: Medicare HMO | Attending: Family Medicine | Admitting: Physical Therapy

## 2021-04-11 DIAGNOSIS — M25562 Pain in left knee: Secondary | ICD-10-CM | POA: Insufficient documentation

## 2021-04-11 DIAGNOSIS — M5416 Radiculopathy, lumbar region: Secondary | ICD-10-CM | POA: Diagnosis not present

## 2021-04-11 DIAGNOSIS — M25561 Pain in right knee: Secondary | ICD-10-CM | POA: Diagnosis not present

## 2021-04-11 DIAGNOSIS — G8929 Other chronic pain: Secondary | ICD-10-CM | POA: Diagnosis not present

## 2021-04-11 DIAGNOSIS — R2689 Other abnormalities of gait and mobility: Secondary | ICD-10-CM | POA: Diagnosis not present

## 2021-04-11 NOTE — Therapy (Addendum)
OUTPATIENT PHYSICAL THERAPY LOWER EXTREMITY EVALUATION/Discharge    Patient Name: [name] MRN: [id number] DOB:11-07-1940, 81 y.o., female Today's Date: 04/12/2021   PT End of Session - 04/12/21 2000     Visit Number 1    Number of Visits 12    Date for PT Re-Evaluation 05/24/21    Authorization Type humana    PT Start Time 1515    PT Stop Time 1558    PT Time Calculation (min) 43 min    Activity Tolerance Patient tolerated treatment well    Behavior During Therapy [facility] for tasks assessed/performed             Past Medical History:  Diagnosis Date   Allergic rhinitis    Anxiety    Arthritis    Chronic cough    Dr Melvyn Novas, onset 11/2007, Sinus CT July 7,2010>neg, Resolved 10/2008 on ppi two times a day   Colon polyp    Depression    Diabetes mellitus type II    diet   Diverticulosis    Dyslipidemia    GERD (gastroesophageal reflux disease)    Hiatal hernia    Hypertension    Hypothyroidism    Lung nodule    Obesity (BMI 30-39.9)    Pneumonia    Sleep apnea    Dr Gwenette Greet CPAP 2009   Past Surgical History:  Procedure Laterality Date   ABDOMINAL HYSTERECTOMY     APPENDECTOMY     BLADDER SUSPENSION     CHOLECYSTECTOMY     1998 in [place]  10/13   and 12/13; cataract surgery   Left breast lumpectomy     NISSEN FUNDOPLICATION     March '11 Dr Johney Maine   RECTOCELE REPAIR     TONSILLECTOMY     x 2   VENTRAL HERNIA REPAIR     Patient Active Problem List   Diagnosis Date Noted   Acute recurrent maxillary sinusitis 02/20/2021   Subacute cough 02/20/2021   Impaired gait and mobility 01/17/2021   Bilateral hearing loss due to cerumen impaction 12/23/2020   Psychophysiological insomnia 12/22/2020   Visit for screening mammogram 11/08/2020   Recurrent major depressive disorder, in full remission ([place]) 05/26/2020   Sensorineural hearing loss (SNHL) of both ears 05/26/2020   Routine general medical examination at a health care facility 01/22/2020    Stage 3b chronic kidney disease ([place]) 01/20/2020   Acromioclavicular arthrosis 53/66/4403   Uncomplicated type 2 diabetes mellitus ([place]) 11/29/2014   GAD (generalized anxiety disorder) 08/19/2014   Hyperlipidemia with target LDL less than 100 06/25/2013   GERD 12/03/2008   OBSTRUCTIVE SLEEP APNEA 05/22/2007   Type II diabetes mellitus with manifestations ([place]) 02/19/2007   Allergic rhinitis 02/19/2007   Hypothyroidism 12/21/2006   Essential hypertension 12/21/2006    PCP: Janith Lima, MD  REFERRING PROVIDER: Gregor Hams, MD  REFERRING DIAG:  M25.561,M25.562,G89.29 (ICD-10-CM) - Chronic pain of both knees  M54.16 (ICD-10-CM) - Lumbar radiculopathy    THERAPY DIAG:  Chronic pain of left knee  Chronic pain of right knee  Other abnormalities of gait and mobility  ONSET DATE: Several years   SUBJECTIVE:   SUBJECTIVE STATEMENT: Patient has periodic knee pain with activity. She has shots every few months. She reports the recent round of shots have worked. She is having very little pain at this time. She also has back pain on her script but she reports her back pain has not been much of an issue. She would  like to focus on the knees.   PERTINENT HISTORY: Anxiety, depression, DMII, diverticulosis   PAIN:  Are you having pain? Yes NPRS scale: 0/10 as long as they stay wamr. Patien reports the shots ward off the pain  Pain location: bilateral knees  Pain orientation: Bilateral in bilateral knees and quads. Pain can also travel down to her foot at times  PAIN TYPE: aching Pain description: intermittent  Aggravating factors: the weather  Relieving factors: voltarin,   PRECAUTIONS: None  WEIGHT BEARING RESTRICTIONS No  FALLS:  Has patient fallen in last 6 months? No, Number of falls: did get down on her knees and couldn't get up  LIVING ENVIRONMENT: No steps or stairs    OCCUPATION: Retired    Office manager: Walking   PLOF: Independent with basic ADLs  PATIENT  GOALS  To be able to keep moving with less Knee pain    OBJECTIVE:   DIAGNOSTIC FINDINGS:   PATIENT SURVEYS:  FOTO 41% ability 55 % expected in 11 visits   COGNITION:  Overall cognitive status: Within functional limits for tasks assessed     SENSATION:  Light touch: Appears intact  Stereognosis: Appears intact  Hot/Cold: Appears intact  Proprioception: Appears intact  MUSCLE LENGTH:  POSTURE:  Mild forward head   PALPATION: No unexpected tenderness to palpation   LE AROM/PROM:  No significant pain with end range bilateral knee flexion. Full knee extension bilateral   LE MMT:  MMT Right 04/12/2021 Left 04/12/2021  Hip flexion 10.3 9.9  Hip extension    Hip abduction 28.9 25.6  Hip adduction    Hip internal rotation    Hip external rotation    Knee flexion    Knee extension 29.6 17.8  Ankle dorsiflexion    Ankle plantarflexion    Ankle inversion    Ankle eversion     (Blank rows = not tested)   FUNCTIONAL TESTS:  Slow transfer from sit to stand from a low surface  GAIT: Mild decrease in hip flexion and lateral movement. She reports she feels safer with a walker but hers is too difficult to get in and out of the car. She was advised she can request a prescription from her MD for a walker.    TODAY'S TREATMENT: Exercises Seated Knee Extension with Resistance 3 sets - 10 reps yellow  Seated Hip Abduction with Resistance - 3 sets - 10 reps yellow  Seated Knee Lifts with Resistance 3 sets - 10 reps yellow     PATIENT EDUCATION:  Education details: reviewed HEP and symptom management  Person educated: Patient Education method: Explanation, Demonstration, Tactile cues, and Verbal cues Education comprehension: verbalized understanding, returned demonstration, verbal cues required, tactile cues required, and needs further education   HOME EXERCISE PROGRAM: Access Code: [email or web address] URL: [email or web address] Date: 04/12/2021 Prepared by: Carolyne Littles  Exercises Seated Knee Extension with Resistance - 1 x daily - 7 x weekly - 3 sets - 10 reps Seated Hip Abduction with Resistance - 1 x daily - 7 x weekly - 3 sets - 10 reps Seated Knee Lifts with Resistance - 1 x daily - 7 x weekly - 3 sets - 10 reps   ASSESSMENT:  CLINICAL IMPRESSION: Patient is an 81 y.o female who was seen today for physical therapy evaluation and treatment for bilateral knee pain L> R. She presents with decreased strength with left knee extension. She also has decreased abductor strength compared to age related norms, but only mild. She has not had pain  since her shot but she would like to work on a program to decrease her strength. She will be moving soon as well. She would benefit from skilled therapy to increase strength and stability of bilateral LE in order to perform ADL's/ IADL's   Objective impairments include Abnormal gait, decreased activity tolerance, decreased endurance, decreased mobility, difficulty walking, decreased strength, and pain. These impairments are limiting patient from cleaning, community activity, meal prep, yard work, and shopping. Personal factors including Age and 1-2 comorbidities: DMII, depression, anxiety   are also affecting patient's functional outcome. Patient will benefit from skilled PT to address above impairments and improve overall function.  REHAB POTENTIAL: Good  CLINICAL DECISION MAKING: Stable/uncomplicated  EVALUATION COMPLEXITY: Low   GOALS: Goals reviewed with patient? Yes  SHORT TERM GOALS:  STG Name Target Date Goal status  1 Patient will increase hip abduction strength by 10 lbs  Baseline:  05/03/2021 INITIAL  2 Patient will increase left knee extension strength by 10 lbs  Baseline:  05/03/2021 INITIAL  3 Patient will be independent with HEP  Baseline: 05/03/2021 INITIAL  LONG TERM GOALS:   LTG Name Target Date Goal status  1 Patient will report no significant knee pain with ADL's  Baseline: 05/24/2021  INITIAL  2 Patient will be independent with a complete strengthening program without increased knee pain  Baseline: 05/24/2021 INITIAL  3 Patient will demonstrate  ability on FOTO in order to demonstrate improved self perceived function    Baseline: 05/24/2021 INITIAL  PLAN: PT FREQUENCY:  2x/week PT DURATION: 6 weeks  PLANNED INTERVENTIONS: Therapeutic exercises, Therapeutic activity, Neuro Muscular re-education, Balance training, Gait training, Patient/Family education, Joint mobilization, Stair training, DME instructions, Aquatic Therapy, Electrical stimulation, Cryotherapy, Moist heat, Ultrasound, and Manual therapy  PLAN FOR NEXT SESSION: add in supine series off quad strengthening exercises; Add in standing weight shifts and marching; review balance activity,    PHYSICAL THERAPY DISCHARGE SUMMARY  Visits from Start of Care: 1  Current functional level related to goals / functional outcomes: Patient did not return for therapy    Remaining deficits: Unknown    Education / Equipment: Unknown    Patient agrees to discharge. Patient goals were not met. Patient is being discharged due to not returning since the last visit.   Referring diagnosis? M25.561,M25.562,G89.29 (ICD-10-CM) - Chronic pain of both knees Treatment diagnosis? (if different than referring diagnosis) same  What was this (referring dx) caused by? _0  Surgery _1  Fall _2  Ongoing issue _3  Arthritis _4  Other: ____________  Laterality: _5  Rt _6  Lt _7  Both  Check all possible CPT codes:  *CHOOSE 10 OR LESS*    _8  97110 (Therapeutic Exercise)  _9  92507 (SLP Treatment)  _10  16606 (Neuro Re-ed)   _11  92526 (Swallowing Treatment)   _12  30160 (Gait Training)   _13  10932 (Cognitive Training, 1st 15 minutes) _14  97140 (Manual Therapy)   _15  97130 (Cognitive Training, each add'l 15 minutes)  _16  97530 (Therapeutic Activities)  _17  Other, List CPT Code ____________    _18  35573 (Self Care)       _19  All codes above (97110 -  97535)  _20  97012 (Mechanical Traction)  _21  97014 (E-stim Unattended)  _22  97032 (E-stim manual)  _23  97033 (Ionto)  _24  97035 (Ultrasound)  _25  97760 (Orthotic Fit) _26  97750 (Physical Performance Training) _27  [id number] (Aquatic Therapy) _28  22025 (Contrast Bath) _29  42706 (Paraffin) _30  97597 (Wound Care 1st 20 sq cm) _31  97598 (Wound Care each add'l 20 sq cm) _32  97016 (Vasopneumatic Device) _33   97760 Comptroller) _0  [phone] (Prosthetic Training)   Carney Living, PT 04/12/2021, 8:04 PM

## 2021-04-12 ENCOUNTER — Encounter (HOSPITAL_BASED_OUTPATIENT_CLINIC_OR_DEPARTMENT_OTHER): Payer: Self-pay | Admitting: Physical Therapy

## 2021-04-13 ENCOUNTER — Telehealth: Payer: Self-pay | Admitting: Internal Medicine

## 2021-04-13 ENCOUNTER — Ambulatory Visit: Payer: Medicare HMO | Admitting: Family Medicine

## 2021-04-13 ENCOUNTER — Ambulatory Visit: Payer: Medicare HMO | Admitting: Internal Medicine

## 2021-04-13 NOTE — Telephone Encounter (Signed)
1.Medication Requested: Multiple Vitamins-Minerals (CENTRUM SILVER PO) rosuvastatin (CRESTOR) 10 MG tablet Suvorexant (BELSOMRA) 15 MG TABS TIADYLT ER 180 MG 24 hr capsule triamcinolone cream (KENALOG) 0.5 % Blood Glucose Calibration (ACCU-CHEK GUIDE CONTROL) LIQD Blood Glucose Monitoring Suppl (ACCU-CHEK GUIDE ME) w/Device KIT carvedilol (COREG) 3.125 MG tablet diclofenac Sodium (VOLTAREN) 1 % GEL FLUoxetine (PROZAC) 20 MG capsule glucose blood (ACCU-CHEK GUIDE) test strip KLOR-CON M20 20 MEQ tablet levothyroxine (SYNTHROID) 50 MCG tablet losartan (COZAAR) 100 MG tablet   2. Pharmacy (Name, [place], [facility]): [place], [place]  Phone:  [phone] Fax:  [phone]   3. On Med List: yes  4. Last Visit with PCP: 01.12.23  5. Next visit date with PCP: 01.26.23   Agent: Please be advised that RX refills may take up to 3 business days. We ask that you follow-up with your pharmacy.

## 2021-04-14 ENCOUNTER — Other Ambulatory Visit: Payer: Self-pay | Admitting: Internal Medicine

## 2021-04-14 DIAGNOSIS — E118 Type 2 diabetes mellitus with unspecified complications: Secondary | ICD-10-CM

## 2021-04-14 DIAGNOSIS — F5104 Psychophysiologic insomnia: Secondary | ICD-10-CM

## 2021-04-14 DIAGNOSIS — E039 Hypothyroidism, unspecified: Secondary | ICD-10-CM

## 2021-04-14 DIAGNOSIS — I10 Essential (primary) hypertension: Secondary | ICD-10-CM

## 2021-04-14 DIAGNOSIS — F3342 Major depressive disorder, recurrent, in full remission: Secondary | ICD-10-CM

## 2021-04-14 MED ORDER — POTASSIUM CHLORIDE CRYS ER 20 MEQ PO TBCR: 20.0000 meq | EXTENDED_RELEASE_TABLET | Freq: Every day | ORAL | 1 refills | Status: DC

## 2021-04-14 MED ORDER — LEVOTHYROXINE SODIUM 50 MCG PO TABS: 50.0000 ug | ORAL_TABLET | Freq: Every day | ORAL | 0 refills | Status: DC

## 2021-04-14 MED ORDER — LOSARTAN POTASSIUM 100 MG PO TABS: 100.0000 mg | ORAL_TABLET | Freq: Every day | ORAL | 1 refills | Status: DC

## 2021-04-14 MED ORDER — DILTIAZEM HCL ER BEADS 180 MG PO CP24: 180.0000 mg | ORAL_CAPSULE | Freq: Every day | ORAL | 1 refills | Status: DC

## 2021-04-14 MED ORDER — CARVEDILOL 3.125 MG PO TABS: 3.1250 mg | ORAL_TABLET | Freq: Two times a day (BID) | ORAL | 1 refills | Status: DC

## 2021-04-14 MED ORDER — FLUOXETINE HCL 20 MG PO CAPS: ORAL_CAPSULE | ORAL | 1 refills | Status: DC

## 2021-04-14 MED ORDER — ACCU-CHEK GUIDE ME W/DEVICE KIT: 1.0000 | PACK | Freq: Two times a day (BID) | 2 refills | Status: AC | PRN

## 2021-04-14 MED ORDER — ACCU-CHEK GUIDE VI STRP: 1.0000 | ORAL_STRIP | Freq: Two times a day (BID) | 1 refills | Status: AC

## 2021-04-14 MED ORDER — ACCU-CHEK GUIDE CONTROL VI LIQD: 1.0000 | Freq: Two times a day (BID) | 3 refills | Status: AC | PRN

## 2021-04-14 MED ORDER — BELSOMRA 15 MG PO TABS: 15.0000 mg | ORAL_TABLET | Freq: Every evening | ORAL | 1 refills | Status: DC | PRN

## 2021-04-27 ENCOUNTER — Encounter (HOSPITAL_BASED_OUTPATIENT_CLINIC_OR_DEPARTMENT_OTHER): Payer: Medicare HMO | Admitting: Physical Therapy

## 2021-05-02 ENCOUNTER — Encounter (HOSPITAL_BASED_OUTPATIENT_CLINIC_OR_DEPARTMENT_OTHER): Payer: Medicare HMO | Admitting: Physical Therapy

## 2021-05-08 ENCOUNTER — Encounter (HOSPITAL_BASED_OUTPATIENT_CLINIC_OR_DEPARTMENT_OTHER): Payer: Medicare HMO | Admitting: Physical Therapy

## 2021-05-15 ENCOUNTER — Encounter (HOSPITAL_BASED_OUTPATIENT_CLINIC_OR_DEPARTMENT_OTHER): Payer: Medicare HMO | Admitting: Physical Therapy

## 2021-05-15 DIAGNOSIS — J0100 Acute maxillary sinusitis, unspecified: Secondary | ICD-10-CM | POA: Diagnosis not present

## 2021-05-22 ENCOUNTER — Ambulatory Visit (INDEPENDENT_AMBULATORY_CARE_PROVIDER_SITE_OTHER): Payer: Medicare HMO | Admitting: Family Medicine

## 2021-05-22 ENCOUNTER — Ambulatory Visit (INDEPENDENT_AMBULATORY_CARE_PROVIDER_SITE_OTHER): Payer: Medicare HMO | Admitting: Internal Medicine

## 2021-05-22 ENCOUNTER — Encounter: Payer: Self-pay | Admitting: Internal Medicine

## 2021-05-22 ENCOUNTER — Other Ambulatory Visit: Payer: Self-pay

## 2021-05-22 VITALS — BP 132/78 | HR 62 | Temp 97.7°F | Resp 16 | Ht 59.0 in | Wt 148.0 lb

## 2021-05-22 VITALS — BP 144/86 | HR 61 | Ht 59.0 in | Wt 148.8 lb

## 2021-05-22 DIAGNOSIS — E118 Type 2 diabetes mellitus with unspecified complications: Secondary | ICD-10-CM

## 2021-05-22 DIAGNOSIS — D539 Nutritional anemia, unspecified: Secondary | ICD-10-CM | POA: Insufficient documentation

## 2021-05-22 DIAGNOSIS — R7989 Other specified abnormal findings of blood chemistry: Secondary | ICD-10-CM

## 2021-05-22 DIAGNOSIS — I5032 Chronic diastolic (congestive) heart failure: Secondary | ICD-10-CM | POA: Diagnosis not present

## 2021-05-22 DIAGNOSIS — I10 Essential (primary) hypertension: Secondary | ICD-10-CM | POA: Diagnosis not present

## 2021-05-22 DIAGNOSIS — F3342 Major depressive disorder, recurrent, in full remission: Secondary | ICD-10-CM

## 2021-05-22 DIAGNOSIS — M5416 Radiculopathy, lumbar region: Secondary | ICD-10-CM | POA: Diagnosis not present

## 2021-05-22 DIAGNOSIS — M25561 Pain in right knee: Secondary | ICD-10-CM

## 2021-05-22 DIAGNOSIS — R0609 Other forms of dyspnea: Secondary | ICD-10-CM | POA: Diagnosis not present

## 2021-05-22 DIAGNOSIS — M25562 Pain in left knee: Secondary | ICD-10-CM

## 2021-05-22 DIAGNOSIS — N3281 Overactive bladder: Secondary | ICD-10-CM | POA: Diagnosis not present

## 2021-05-22 DIAGNOSIS — G8929 Other chronic pain: Secondary | ICD-10-CM | POA: Diagnosis not present

## 2021-05-22 DIAGNOSIS — E039 Hypothyroidism, unspecified: Secondary | ICD-10-CM | POA: Diagnosis not present

## 2021-05-22 LAB — IBC + FERRITIN
Ferritin: 25.6 ng/mL (ref 10.0–291.0)
Iron: 70 ug/dL (ref 42–145)
Saturation Ratios: 20.7 % (ref 20.0–50.0)
TIBC: 338.8 ug/dL (ref 250.0–450.0)
Transferrin: 242 mg/dL (ref 212.0–360.0)

## 2021-05-22 LAB — CBC WITH DIFFERENTIAL/PLATELET
Basophils Absolute: 0.1 10*3/uL (ref 0.0–0.1)
Basophils Relative: 1 % (ref 0.0–3.0)
Eosinophils Absolute: 0.1 10*3/uL (ref 0.0–0.7)
Eosinophils Relative: 1.2 % (ref 0.0–5.0)
HCT: 37.2 % (ref 36.0–46.0)
Hemoglobin: 12.3 g/dL (ref 12.0–15.0)
Lymphocytes Relative: 25.6 % (ref 12.0–46.0)
Lymphs Abs: 1.9 10*3/uL (ref 0.7–4.0)
MCHC: 33 g/dL (ref 30.0–36.0)
MCV: 96.4 fl (ref 78.0–100.0)
Monocytes Absolute: 0.7 10*3/uL (ref 0.1–1.0)
Monocytes Relative: 9.5 % (ref 3.0–12.0)
Neutro Abs: 4.6 10*3/uL (ref 1.4–7.7)
Neutrophils Relative %: 62.7 % (ref 43.0–77.0)
Platelets: 272 10*3/uL (ref 150.0–400.0)
RBC: 3.86 Mil/uL — ABNORMAL LOW (ref 3.87–5.11)
RDW: 15.1 % (ref 11.5–15.5)
WBC: 7.3 10*3/uL (ref 4.0–10.5)

## 2021-05-22 LAB — BASIC METABOLIC PANEL
BUN: 13 mg/dL (ref 6–23)
CO2: 27 mEq/L (ref 19–32)
Calcium: 9.6 mg/dL (ref 8.4–10.5)
Chloride: 106 mEq/L (ref 96–112)
Creatinine, Ser: 0.86 mg/dL (ref 0.40–1.20)
GFR: 63.12 mL/min (ref >60.00)
Glucose, Bld: 90 mg/dL (ref 70–99)
Potassium: 4 mEq/L (ref 3.5–5.1)
Sodium: 142 mEq/L (ref 135–145)

## 2021-05-22 LAB — TSH: TSH: 1.18 u[IU]/mL (ref 0.35–5.50)

## 2021-05-22 LAB — HEMOGLOBIN A1C: Hgb A1c MFr Bld: 6.3 % (ref 4.6–6.5)

## 2021-05-22 LAB — VITAMIN B12: Vitamin B-12: 554 pg/mL (ref 211–911)

## 2021-05-22 LAB — FOLATE: Folate: >24.2 ng/mL (ref >5.9)

## 2021-05-22 MED ORDER — EMPAGLIFLOZIN 10 MG PO TABS: 10.0000 mg | ORAL_TABLET | Freq: Every day | ORAL | 1 refills | Status: DC

## 2021-05-22 MED ORDER — AMBULATORY NON FORMULARY MEDICATION: 0 refills | Status: AC

## 2021-05-22 MED ORDER — FLUOXETINE HCL 40 MG PO CAPS: 40.0000 mg | ORAL_CAPSULE | Freq: Every day | ORAL | 1 refills | Status: DC

## 2021-05-22 MED ORDER — GEMTESA 75 MG PO TABS: 1.0000 | ORAL_TABLET | Freq: Every day | ORAL | 1 refills | Status: DC

## 2021-05-22 NOTE — Progress Notes (Signed)
Subjective:  Patient ID: [name], female    DOB: Jul 02, 1940  Age: 81 y.o. MRN: [id number]  CC: Anemia and Congestive Heart Failure  This visit occurred during the SARS-CoV-2 public health emergency.  Safety protocols were in place, including screening questions prior to the visit, additional usage of staff PPE, and extensive cleaning of exam room while observing appropriate contact time as indicated for disinfecting solutions.    HPI Mackenzey Maggart presents for f/up -   She wants to increase the prozac dose. She continues to experience DOE but no CP.  Outpatient Medications Prior to Visit  Medication Sig Dispense Refill   Blood Glucose Calibration (ACCU-CHEK GUIDE CONTROL) LIQD 1 Act by In Vitro route 2 (two) times daily as needed. 1 each 3   Blood Glucose Monitoring Suppl (ACCU-CHEK GUIDE ME) w/Device KIT 1 Act by Does not apply route 2 (two) times daily as needed. 2 kit 2   carvedilol (COREG) 3.125 MG tablet Take 1 tablet (3.125 mg total) by mouth 2 (two) times daily with a meal. 180 tablet 1   diclofenac Sodium (VOLTAREN) 1 % GEL Apply 4 g topically 4 (four) times daily. 100 g 5   diltiazem (TIADYLT ER) 180 MG 24 hr capsule Take 1 capsule (180 mg total) by mouth daily. 90 capsule 1   glucose blood (ACCU-CHEK GUIDE) test strip 1 each by Other route 2 (two) times daily. 300 each 1   levothyroxine (SYNTHROID) 50 MCG tablet Take 1 tablet (50 mcg total) by mouth daily. 90 tablet 0   losartan (COZAAR) 100 MG tablet Take 1 tablet (100 mg total) by mouth daily. 90 tablet 1   metFORMIN (GLUCOPHAGE-XR) 750 MG 24 hr tablet TAKE 1 TABLET BY MOUTH EVERY DAY WITH BREAKFAST 90 tablet 0   Multiple Vitamins-Minerals (CENTRUM SILVER PO) Take by mouth daily.     potassium chloride SA (KLOR-CON M20) 20 MEQ tablet Take 1 tablet (20 mEq total) by mouth daily. 90 tablet 1   rosuvastatin (CRESTOR) 10 MG tablet Take 1 tablet (10 mg total) by mouth daily. 90 tablet 3   Suvorexant (BELSOMRA) 15 MG TABS  Take 15 mg by mouth at bedtime as needed. 90 tablet 1   triamcinolone cream (KENALOG) 0.5 %      AMBULATORY NON FORMULARY MEDICATION Folding walker with front wheels.  Use as needed. Disp 1 R26.89 1 each 0   FLUoxetine (PROZAC) 20 MG capsule TAKE 1 CAPSULE AT BEDTIME 90 capsule 1   No facility-administered medications prior to visit.    ROS Review of Systems  Constitutional:  Positive for fatigue. Negative for diaphoresis and unexpected weight change.  HENT: Negative.    Eyes: Negative.   Respiratory:  Positive for shortness of breath. Negative for cough, chest tightness and wheezing.   Cardiovascular:  Negative for chest pain, palpitations and leg swelling.  Gastrointestinal:  Negative for abdominal pain, constipation, diarrhea, nausea and vomiting.  Endocrine: Positive for polyuria.  Genitourinary:  Positive for frequency and urgency. Negative for decreased urine volume, difficulty urinating, dyspareunia, dysuria, flank pain, hematuria and vaginal bleeding.  Musculoskeletal: Negative.  Negative for arthralgias and myalgias.  Skin: Negative.   Neurological: Negative.  Negative for dizziness, weakness and light-headedness.  Hematological: Negative.  Negative for adenopathy. Does not bruise/bleed easily.  Psychiatric/Behavioral:  Positive for decreased concentration and dysphoric mood. Negative for behavioral problems, confusion, sleep disturbance and suicidal ideas. The patient is not nervous/anxious.    Objective:  BP 132/78 (BP Location: Right Arm, Patient Position:  Sitting, Cuff Size: Large)    Pulse 62    Temp 97.7 F (36.5 C) (Oral)    Resp 16    Ht _0  (1.499 m)    Wt 148 lb (67.1 kg)    SpO2 98%    BMI 29.89 kg/m   BP Readings from Last 3 Encounters:  05/22/21 (!) 144/86  05/22/21 132/78  03/31/21 126/72    Wt Readings from Last 3 Encounters:  05/22/21 148 lb 12.8 oz (67.5 kg)  05/22/21 148 lb (67.1 kg)  03/31/21 149 lb 12.8 oz (67.9 kg)    Physical Exam Vitals  reviewed.  Constitutional:      Appearance: She is not ill-appearing.  HENT:     Nose: Nose normal.     Mouth/Throat:     Mouth: Mucous membranes are moist.  Eyes:     General: No scleral icterus.    Conjunctiva/sclera: Conjunctivae normal.  Cardiovascular:     Rate and Rhythm: Regular rhythm. Bradycardia present.     Heart sounds: Normal heart sounds, S1 normal and S2 normal. No murmur heard.   No friction rub. No gallop.     Comments: EKG- SB, 57 bpm Anterior infarct pattern is old Unchanged EKG Pulmonary:     Effort: Pulmonary effort is normal.     Breath sounds: No stridor. No wheezing, rhonchi or rales.  Abdominal:     General: Abdomen is protuberant. Bowel sounds are normal. There is no distension.     Palpations: Abdomen is soft. There is no hepatomegaly, splenomegaly or mass.     Tenderness: There is no abdominal tenderness.  Musculoskeletal:     Cervical back: Neck supple.     Right lower leg: No edema.     Left lower leg: No edema.  Lymphadenopathy:     Cervical: No cervical adenopathy.  Skin:    General: Skin is warm and dry.     Findings: No rash.  Neurological:     General: No focal deficit present.     Mental Status: She is alert. Mental status is at baseline.  Psychiatric:        Mood and Affect: Mood normal.        Behavior: Behavior normal.        Thought Content: Thought content normal.    Lab Results  Component Value Date   WBC 7.3 05/22/2021   HGB 12.3 05/22/2021   HCT 37.2 05/22/2021   PLT 272.0 05/22/2021   GLUCOSE 90 05/22/2021   CHOL 166 01/19/2020   TRIG 114.0 01/19/2020   HDL 64.80 01/19/2020   LDLDIRECT 120.6 11/24/2012   LDLCALC 79 01/19/2020   ALT 18 03/08/2015   AST 19 03/08/2015   NA 142 05/22/2021   K 4.0 05/22/2021   CL 106 05/22/2021   CREATININE 0.86 05/22/2021   BUN 13 05/22/2021   CO2 27 05/22/2021   TSH 1.18 05/22/2021   INR 1.0 10/01/2011   HGBA1C 6.3 05/22/2021   MICROALBUR 1.2 01/05/2021    MM 3D SCREEN  BREAST BILATERAL  Result Date: 01/12/2021 CLINICAL DATA:  Screening. EXAM: DIGITAL SCREENING BILATERAL MAMMOGRAM WITH TOMOSYNTHESIS AND CAD TECHNIQUE: Bilateral screening digital craniocaudal and mediolateral oblique mammograms were obtained. Bilateral screening digital breast tomosynthesis was performed. The images were evaluated with computer-aided detection. COMPARISON:  Previous exam(s). ACR Breast Density Category c: The breast tissue is heterogeneously dense, which may obscure small masses. FINDINGS: There are no findings suspicious for malignancy. IMPRESSION: No mammographic evidence of malignancy. A result  letter of this screening mammogram will be mailed directly to the patient. RECOMMENDATION: Screening mammogram in one year. (Code:SM-B-01Y) BI-RADS CATEGORY  1: Negative. Electronically Signed   By: Margarette Canada M.D.   On: 01/12/2021 16:19    Assessment & Plan:   Yasira was seen today for anemia and congestive heart failure.  Diagnoses and all orders for this visit:  Deficiency anemia - Her H/H are normal now. Will screen for vit deficiencies. -     IBC + Ferritin; Future -     Vitamin B1; Future -     Folate; Future -     CBC with Differential/Platelet; Future -     Reticulocytes; Future -     Vitamin B12; Future -     Vitamin B12 -     Reticulocytes -     CBC with Differential/Platelet -     Folate -     Vitamin B1 -     IBC + Ferritin  DOE (dyspnea on exertion)- EKG is reassuring. Will treat the HFpEF. -     NT-proBNP; Future -     EKG 12-Lead -     NT-proBNP  Elevated brain natriuretic peptide (BNP) level -     NT-proBNP; Future -     NT-proBNP  Essential hypertension- Her BP is adequately well controlled. -     Basic metabolic panel; Future -     Basic metabolic panel  Recurrent major depressive disorder, in full remission (HCC) -     FLUoxetine (PROZAC) 40 MG capsule; Take 1 capsule (40 mg total) by mouth daily.  Acquired hypothyroidism- TSH is in the normal  range. -     TSH; Future -     TSH  Chronic heart failure with preserved ejection fraction (HCC) -     empagliflozin (JARDIANCE) 10 MG TABS tablet; Take 1 tablet (10 mg total) by mouth daily before breakfast.  OAB (overactive bladder) -     Vibegron (GEMTESA) 75 MG TABS; Take 1 tablet by mouth daily.  Type II diabetes mellitus with manifestations (HCC) -     Hemoglobin A1c; Future -     Hemoglobin A1c   I have discontinued Ameli Burtis's FLUoxetine. I am also having her start on FLUoxetine, empagliflozin, and Gemtesa. Additionally, I am having her maintain her Multiple Vitamins-Minerals (CENTRUM SILVER PO), diclofenac Sodium, triamcinolone cream, rosuvastatin, metFORMIN, Accu-Chek Guide Control, Accu-Chek Guide Me, carvedilol, Accu-Chek Guide, potassium chloride SA, levothyroxine, losartan, Belsomra, and diltiazem.  Meds ordered this encounter  Medications   FLUoxetine (PROZAC) 40 MG capsule    Sig: Take 1 capsule (40 mg total) by mouth daily.    Dispense:  90 capsule    Refill:  1   empagliflozin (JARDIANCE) 10 MG TABS tablet    Sig: Take 1 tablet (10 mg total) by mouth daily before breakfast.    Dispense:  90 tablet    Refill:  1   Vibegron (GEMTESA) 75 MG TABS    Sig: Take 1 tablet by mouth daily.    Dispense:  90 tablet    Refill:  1     Follow-up: Return in about 3 months (around 08/22/2021).  Scarlette Calico, MD

## 2021-05-22 NOTE — Patient Instructions (Signed)
Anemia ?Anemia is a condition in which there is not enough red blood cells or hemoglobin in the blood. Hemoglobin is a substance in red blood cells that carries oxygen. ?When you do not have enough red blood cells or hemoglobin (are anemic), your body cannot get enough oxygen and your organs may not work properly. As a result, you may feel very tired or have other problems. ?What are the causes? ?Common causes of anemia include: ?Excessive bleeding. Anemia can be caused by excessive bleeding inside or outside the body, including bleeding from the intestines or from heavy menstrual periods in females. ?Poor nutrition. ?Long-lasting (chronic) kidney, thyroid, and liver disease. ?Bone marrow disorders, spleen problems, and blood disorders. ?Cancer and treatments for cancer. ?HIV (human immunodeficiency virus) and AIDS (acquired immunodeficiency syndrome). ?Infections, medicines, and autoimmune disorders that destroy red blood cells. ?What are the signs or symptoms? ?Symptoms of this condition include: ?Minor weakness. ?Dizziness. ?Headache, or difficulties concentrating and sleeping. ?Heartbeats that feel irregular or faster than normal (palpitations). ?Shortness of breath, especially with exercise. ?Pale skin, lips, and nails, or cold hands and feet. ?Indigestion and nausea. ?Symptoms may occur suddenly or develop slowly. If your anemia is mild, you may not have symptoms. ?How is this diagnosed? ?This condition is diagnosed based on blood tests, your medical history, and a physical exam. In some cases, a test may be needed in which cells are removed from the soft tissue inside of a bone and looked at under a microscope (bone marrow biopsy). Your health care provider may also check your stool (feces) for blood and may do additional testing to look for the cause of your bleeding. ?Other tests may include: ?Imaging tests, such as a CT scan or MRI. ?A procedure to see inside your esophagus and stomach (endoscopy). ?A  procedure to see inside your colon and rectum (colonoscopy). ?How is this treated? ?Treatment for this condition depends on the cause. If you continue to lose a lot of blood, you may need to be treated at a hospital. Treatment may include: ?Taking supplements of iron, vitamin E09, or folic acid. ?Taking a hormone medicine (erythropoietin) that can help to stimulate red blood cell growth. ?Having a blood transfusion. This may be needed if you lose a lot of blood. ?Making changes to your diet. ?Having surgery to remove your spleen. ?Follow these instructions at home: ?Take over-the-counter and prescription medicines only as told by your health care provider. ?Take supplements only as told by your health care provider. ?Follow any diet instructions that you were given by your health care provider. ?Keep all follow-up visits as told by your health care provider. This is important. ?Contact a health care provider if: ?You develop new bleeding anywhere in the body. ?Get help right away if: ?You are very weak. ?You are short of breath. ?You have pain in your abdomen or chest. ?You are dizzy or feel faint. ?You have trouble concentrating. ?You have bloody stools, black stools, or tarry stools. ?You vomit repeatedly or you vomit up blood. ?These symptoms may represent a serious problem that is an emergency. Do not wait to see if the symptoms will go away. Get medical help right away. Call your local emergency services (911 in the [place] Do not drive yourself to the hospital. ?Summary ?Anemia is a condition in which you do not have enough red blood cells or enough of a substance in your red blood cells that carries oxygen (hemoglobin). ?Symptoms may occur suddenly or develop slowly. ?If your anemia is  mild, you may not have symptoms. ?This condition is diagnosed with blood tests, a medical history, and a physical exam. Other tests may be needed. ?Treatment for this condition depends on the cause of the anemia. ?This  information is not intended to replace advice given to you by your health care provider. Make sure you discuss any questions you have with your health care provider. ?Document Revised: 02/10/2019 Document Reviewed: 02/10/2019 ?Elsevier Patient Education ? 2022 Elsevier Inc. ? ?

## 2021-05-22 NOTE — Progress Notes (Signed)
? ?I, [name], LAT, ATC acting as a scribe for [name], MD. ? ?[name] is a 81 y.o. female who presents to [place] at [facility] today for f/u lumbar radiculopathy radiating into bilat legs. She lives at [facility] assisted living. Pt was last seen by Dr. Georgina Snell on 03/16/21 and was given bilat knee steroid injections and was advised to cont PT, completing 1 visit. Today, pt reports swelling on the posterior aspect of bilat knees. Pt also reports increased pain over the anterior aspect of bilat knees, R>L. Pt locates pain to the posterior-lateral aspect of the R hip/buttock w/ radiating pain distally, R>L. ? ?Dx testing: 09/30/20 L-spine MRI ? 08/08/20 R& L knee XR, and L-spine XR ?            01/05/21 R hand XR ?            01/05/21 Labs ? ?Pertinent review of systems: No fevers or chills ? ?Relevant historical information: Hypertension.  Heart failure with preserved ejection fraction.  Diabetes. ? ? ?Exam:  ?BP (!) 144/86   Pulse 61   Ht '4\' 11"'$  (1.499 m)   Wt 148 lb 12.8 oz (67.5 kg)   SpO2 98%   BMI 30.05 kg/m?  ?General: Well Developed, well nourished, and in no acute distress.  ? ?MSK: L-spine: Nontender midline.  Normal lumbar motion. ? ?Knees bilaterally minimal joint effusion normal motion with crepitation. ? ? ? ?Lab and Radiology Results ? ?  ?EXAM: ?MRI LUMBAR SPINE WITHOUT CONTRAST ?  ?TECHNIQUE: ?Multiplanar, multisequence MR imaging of the lumbar spine was ?performed. No intravenous contrast was administered. ?  ?COMPARISON:  None. ?  ?FINDINGS: ?Segmentation:  5 lumbar type vertebrae ?  ?Alignment:  Physiologic ?  ?Vertebrae:  No fracture, evidence of discitis, or bone lesion. ?  ?Conus medullaris and cauda equina: Conus extends to the L1-2 level. ?Conus and cauda equina appear normal. ?  ?Paraspinal and other soft tissues: No evidence of mass or ?inflammation. ?  ?Disc levels: ?  ?Well preserved disc height and hydration for age. Mild for age facet ?spurring  at L4-5 and L5-S1. No herniation or neural impingement in ?the lumbar spine. ?  ?IMPRESSION: ?Less than typical degenerative changes for age. No neural ?impingement or visible inflammation. ?  ?  ?Electronically Signed ?  By: Monte Fantasia M.D. ?  On: 10/02/2020 21:41 ?  ?I, [name], personally (independently) visualized and performed the interpretation of the images attached in this note. ? ? ? ? ? ?Assessment and Plan: ?81 y.o. female with bilateral knee pain.  She does have some lower leg pain that could be due to L5 lumbar radiculopathy bilaterally right worse than left.  However the majority of her pain is due to her knees.  She last had steroid injection a little over 2 months ago and is eligible for repeat steroid injection March 29th.  She is scheduled with me on March 30 for follow-up visit and the plan is to do injections then. ? ?We spent quite a bit of time talking about the potential for lumbar radiculopathy and we both agreed to wait until after her knee injections to see if her lower leg pain improves.  If it does not likely will proceed with trial of lumbar epidural steroid injection. ? ?Of note she needs a new prescription for a rolling walker.  She had trouble getting it last time it was written. ? ?PDMP not reviewed this encounter. ?No orders of the  defined types were placed in this encounter. ? ?Meds ordered this encounter  ?Medications  ? AMBULATORY NON FORMULARY MEDICATION  ?  Sig: Folding walker with front wheels.  ?Use as needed. ?Disp 1 ?R26.89  ?  Dispense:  1 each  ?  Refill:  0  ? ? ? ?Discussed warning signs or symptoms. Please see discharge instructions. Patient expresses understanding. ? ? ?The above documentation has been reviewed and is accurate and complete [name], M.D. ? ? ?

## 2021-05-22 NOTE — Patient Instructions (Addendum)
Thank you for coming in today.  ? ?We will see each other on March 30th and do an injection.  ? ?We will see about doing a back injection if the knee shots doing work well enough.  ? ?I like the idea of walking more.  ? ? ? ? ?

## 2021-05-23 ENCOUNTER — Ambulatory Visit: Payer: Medicare HMO | Admitting: Family Medicine

## 2021-05-24 ENCOUNTER — Encounter (HOSPITAL_BASED_OUTPATIENT_CLINIC_OR_DEPARTMENT_OTHER): Payer: Medicare HMO | Admitting: Physical Therapy

## 2021-05-28 LAB — RETICULOCYTES
ABS Retic: 75400 cells/uL (ref 20000–80000)
Retic Ct Pct: 2 %

## 2021-05-28 LAB — NT-PROBNP: Pro B Natriuretic peptide (BNP): 482 pg/mL — ABNORMAL HIGH

## 2021-05-28 LAB — VITAMIN B1: Vitamin B1 (Thiamine): 29 nmol/L (ref 8–30)

## 2021-05-31 ENCOUNTER — Encounter (HOSPITAL_BASED_OUTPATIENT_CLINIC_OR_DEPARTMENT_OTHER): Payer: Medicare HMO | Admitting: Physical Therapy

## 2021-06-07 ENCOUNTER — Encounter (HOSPITAL_BASED_OUTPATIENT_CLINIC_OR_DEPARTMENT_OTHER): Payer: Medicare HMO | Admitting: Physical Therapy

## 2021-06-08 ENCOUNTER — Telehealth: Payer: Self-pay

## 2021-06-08 NOTE — Telephone Encounter (Signed)
Pt was calling to see if she could talk to Dr. Ronnald Ramp.  ? ?She reports that she is in no pain but is having trouble breathing. Pt states she never received the medication that was prescribed a few weeks ago (didn't know name).  ? ?I advised the pt after asking CMA that the ER is the best option. ? ?FYI ?

## 2021-06-15 ENCOUNTER — Ambulatory Visit (INDEPENDENT_AMBULATORY_CARE_PROVIDER_SITE_OTHER): Payer: Medicare HMO | Admitting: Family Medicine

## 2021-06-15 ENCOUNTER — Ambulatory Visit: Payer: Self-pay

## 2021-06-15 VITALS — BP 132/84 | HR 65 | Ht 59.0 in | Wt 152.4 lb

## 2021-06-15 DIAGNOSIS — G8929 Other chronic pain: Secondary | ICD-10-CM | POA: Diagnosis not present

## 2021-06-15 DIAGNOSIS — M25561 Pain in right knee: Secondary | ICD-10-CM | POA: Diagnosis not present

## 2021-06-15 DIAGNOSIS — M25562 Pain in left knee: Secondary | ICD-10-CM | POA: Diagnosis not present

## 2021-06-15 NOTE — Patient Instructions (Addendum)
Thank you for coming in today.  ? ?You received steroid injections in both of your today. Seek immediate medical attention if the joint becomes red, extremely painful, or is oozing fluid.  ? ?Recheck back in 3 months. ? ? ?

## 2021-06-15 NOTE — Progress Notes (Signed)
? ?I, [name], LAT, ATC acting as a scribe for [name], MD. ? ?[name] is a 81 y.o. female who presents to [place] at [facility] today for f/u bilat knee pain and lumbar radiculopathy. She lives at [facility] assisted living. Pt was last seen by Dr. Georgina Snell on 05/22/21 and was advised to proceed w/ bilat knee steroid injections at her next visit and then further investigate her radicular pain. Today, pt reports bilat knee are very painful and stiff esp w/ prolonged sitting. Pt continues to have radiating pain in her R lower leg, posteriorly. ? ?She is also having trouble finding a medical supply company that accepts her insurance that she can get a folding walker at. ? ?She is moving to a new apartment/assisted living facility ? ?Dx testing: 09/30/20 L-spine MRI ? 08/08/20 R& L knee XR, and L-spine XR ?            01/05/21 R hand XR ?            01/05/21 Labs ? ?Pertinent review of systems: No fevers or chills ? ?Relevant historical information: Diabetes ? ? ?Exam:  ?BP 132/84   Pulse 65   Ht '4\' 11"'$  (1.499 m)   Wt 152 lb 6.4 oz (69.1 kg)   SpO2 98%   BMI 30.78 kg/m?  ?General: Well Developed, well nourished, and in no acute distress.  ? ?MSK: Right knee: Mild effusion normal motion with crepitation. ? ?Left knee: Mild effusion normal motion with crepitation. ? ? ? ?Lab and Radiology Results ? ?Procedure: Real-time Ultrasound Guided Injection of right knee superior lateral patellar space ?Device: Philips Affiniti 50G ?Images permanently stored and available for review in PACS ?Verbal informed consent obtained.  Discussed risks and benefits of procedure. Warned about infection bleeding damage to structures skin hypopigmentation and fat atrophy among others. ?Patient expresses understanding and agreement ?Time-out conducted.   ?Noted no overlying erythema, induration, or other signs of local infection.   ?Skin prepped in a sterile fashion.   ?Local anesthesia: Topical Ethyl  chloride.   ?With sterile technique and under real time ultrasound guidance: 40 mg of Kenalog and 2 mL of Marcaine injected into knee joint. Fluid seen entering the joint capsule.   ?Completed without difficulty   ?Pain immediately resolved suggesting accurate placement of the medication.   ?Advised to call if fevers/chills, erythema, induration, drainage, or persistent bleeding.   ?Images permanently stored and available for review in the ultrasound unit.  ?Impression: Technically successful ultrasound guided injection. ? ? ? ?Procedure: Real-time Ultrasound Guided Injection of left knee superior lateral patellar space ?Device: Philips Affiniti 50G ?Images permanently stored and available for review in PACS ?Verbal informed consent obtained.  Discussed risks and benefits of procedure. Warned about infection bleeding damage to structures skin hypopigmentation and fat atrophy among others. ?Patient expresses understanding and agreement ?Time-out conducted.   ?Noted no overlying erythema, induration, or other signs of local infection.   ?Skin prepped in a sterile fashion.   ?Local anesthesia: Topical Ethyl chloride.   ?With sterile technique and under real time ultrasound guidance: 40 mg of Kenalog and 2 mL of Marcaine injected into knee joint. Fluid seen entering the joint capsule.   ?Completed without difficulty   ?Pain immediately resolved suggesting accurate placement of the medication.   ?Advised to call if fevers/chills, erythema, induration, drainage, or persistent bleeding.   ?Images permanently stored and available for review in the ultrasound unit.  ?Impression: Technically successful ultrasound guided injection. ? ? ? ? ?  Assessment and Plan: ?81 y.o. female with bilateral knee pain.  Pain thought to be due to DJD.  Plan for repeat steroid injection bilaterally.  Last injection was about 3 months ago. ? ?She will call her insurance company to investigate the walker.  She will let me know where she wants me  to send the prescription for a walker to. ? ? ?PDMP not reviewed this encounter. ?Orders Placed This Encounter  ?Procedures  ? [place] LIMITED JOINT SPACE STRUCTURES LOW BILAT(NO LINKED CHARGES)  ?  Order Specific Question:   Reason for Exam (SYMPTOM  OR DIAGNOSIS REQUIRED)  ?  Answer:   bilateral knee pain  ?  Order Specific Question:   Preferred imaging location?  ?  Answer:   [place]  ? ?No orders of the defined types were placed in this encounter. ? ? ? ?Discussed warning signs or symptoms. Please see discharge instructions. Patient expresses understanding. ? ? ?The above documentation has been reviewed and is accurate and complete [name], M.D. ? ? ?

## 2021-06-21 ENCOUNTER — Other Ambulatory Visit: Payer: Self-pay

## 2021-06-21 ENCOUNTER — Telehealth: Payer: Self-pay | Admitting: Family Medicine

## 2021-06-21 MED ORDER — AMBULATORY NON FORMULARY MEDICATION: 0 refills | Status: DC

## 2021-06-21 NOTE — Telephone Encounter (Signed)
Per pt request, faxed RX for Walker/OV Notes/ins card to  ?[facility] ?Phone [phone] ?Fax [phone] ? ? ?

## 2021-06-23 ENCOUNTER — Ambulatory Visit: Payer: Medicare HMO | Admitting: Pulmonary Disease

## 2021-06-29 ENCOUNTER — Telehealth: Payer: Self-pay | Admitting: Internal Medicine

## 2021-06-30 ENCOUNTER — Other Ambulatory Visit: Payer: Self-pay | Admitting: Internal Medicine

## 2021-06-30 DIAGNOSIS — N3281 Overactive bladder: Secondary | ICD-10-CM

## 2021-06-30 MED ORDER — MIRABEGRON ER 50 MG PO TB24: 50.0000 mg | ORAL_TABLET | Freq: Every day | ORAL | 1 refills | Status: DC

## 2021-06-30 NOTE — Telephone Encounter (Signed)
Called pt, LVM.   

## 2021-07-19 ENCOUNTER — Telehealth: Payer: Self-pay | Admitting: Internal Medicine

## 2021-07-19 ENCOUNTER — Ambulatory Visit (HOSPITAL_BASED_OUTPATIENT_CLINIC_OR_DEPARTMENT_OTHER): Payer: Medicare HMO | Attending: Family Medicine | Admitting: Physical Therapy

## 2021-07-19 NOTE — Therapy (Incomplete)
?OUTPATIENT PHYSICAL THERAPY TREATMENT NOTE ? ? ?Patient Name: [name] ?MRN: [id number] ?DOB:[birth year], 81 y.o., female ?Today's Date: 07/19/2021 ? ?PCP: *** ?REFERRING PROVIDER: *** ? ?END OF SESSION:  ? ? ?Past Medical History:  ?Diagnosis Date  ? Allergic rhinitis   ? Anxiety   ? Arthritis   ? Chronic cough   ? Dr Melvyn Novas, onset 11/2007, Sinus CT July 7,2010>neg, Resolved 10/2008 on ppi two times a day  ? Colon polyp   ? Depression   ? Diabetes mellitus type II   ? diet  ? Diverticulosis   ? Dyslipidemia   ? GERD (gastroesophageal reflux disease)   ? Hiatal hernia   ? Hypertension   ? Hypothyroidism   ? Lung nodule   ? Obesity (BMI 30-39.9)   ? Pneumonia   ? Sleep apnea   ? Dr Gwenette Greet CPAP 2009  ? ?Past Surgical History:  ?Procedure Laterality Date  ? ABDOMINAL HYSTERECTOMY    ? APPENDECTOMY    ? BLADDER SUSPENSION    ? CHOLECYSTECTOMY    ? 1998 in [place]  ? EYE SURGERY  10/13  ? and 12/13; cataract surgery  ? Left breast lumpectomy    ? NISSEN FUNDOPLICATION    ? March '11 Dr Johney Maine  ? RECTOCELE REPAIR    ? TONSILLECTOMY    ? x 2  ? VENTRAL HERNIA REPAIR    ? ?Patient Active Problem List  ? Diagnosis Date Noted  ? Deficiency anemia 05/22/2021  ? Chronic heart failure with preserved ejection fraction ([place]) 05/22/2021  ? OAB (overactive bladder) 05/22/2021  ? Subacute cough 02/20/2021  ? Impaired gait and mobility 01/17/2021  ? Bilateral hearing loss due to cerumen impaction 12/23/2020  ? Psychophysiological insomnia 12/22/2020  ? Visit for screening mammogram 11/08/2020  ? Recurrent major depressive disorder, in full remission ([place]) 05/26/2020  ? Sensorineural hearing loss (SNHL) of both ears 05/26/2020  ? Routine general medical examination at a health care facility 01/22/2020  ? Stage 3b chronic kidney disease ([place]) 01/20/2020  ? Acromioclavicular arthrosis 12/30/2014  ? GAD (generalized anxiety disorder) 08/19/2014  ? Hyperlipidemia with target LDL less than 100 06/25/2013  ? GERD 12/03/2008  ? OBSTRUCTIVE  SLEEP APNEA 05/22/2007  ? Type II diabetes mellitus with manifestations ([place]) 02/19/2007  ? Allergic rhinitis 02/19/2007  ? Hypothyroidism 12/21/2006  ? Essential hypertension 12/21/2006  ? ?PCP: Janith Lima, MD ?  ?REFERRING PROVIDER: Gregor Hams, MD ?  ?REFERRING DIAG:  ?M25.561,M25.562,G89.29 (ICD-10-CM) - Chronic pain of both knees  ?M54.16 (ICD-10-CM) - Lumbar radiculopathy  ?  ?  ?THERAPY DIAG:  ?Chronic pain of left knee ?  ?Chronic pain of right knee ?  ?Other abnormalities of gait and mobility ?  ?ONSET DATE: Several years  ?  ?SUBJECTIVE:  ?  ?SUBJECTIVE STATEMENT: ?Pt received bilat knee injections on 03/16/2021 and had improved sx's in bilat knees.   Pt was evaluated by PT on 04/11/2021 received a HEP.  Cx???? ? ?Pt received bilat US guided injections to bilat knees on 06/15/2021 ? ?She returned to MD and note indicates pt havi some lower leg pain that could be due to L5 lumbar radiculopathy bilaterally right worse than left.  However the majority of her pain is due to her knees.  ? ?She lives at [facility] assisted [facility]  is moving to a new apartment/assisted living facility ? ?Patient has periodic knee pain with activity. She has shots every few months. She reports the recent round of shots have worked. She  is having very little pain at this time. She also has back pain on her script but she reports her back pain has not been much of an issue. She would like to focus on the knees.  ?  ?PERTINENT HISTORY: ?Anxiety, depression, DMII, diverticulosis  ?  ?PAIN:  ?Are you having pain? Yes ?NPRS scale: 0/10 as long as they stay wamr. Patien reports the shots ward off the pain  ?Pain location: bilateral knees  ?Pain orientation: Bilateral in bilateral knees and quads. Pain can also travel down to her foot at times  ?PAIN TYPE: aching ?Pain description: intermittent  ?Aggravating factors: the weather  ?Relieving factors: voltarin,  ?  ?PRECAUTIONS: None ?  ?WEIGHT BEARING RESTRICTIONS No ?  ?FALLS:  ?Has  patient fallen in last 6 months? No, Number of falls: did get down on her knees and couldn't get up ?  ?LIVING ENVIRONMENT: ?No steps or stairs  ?  ?  ?OCCUPATION: Retired   ?  ?Hobbies: Walking  ?  ?PLOF: Independent with basic ADLs.  She lives at [facility] assisted [facility] ?  ?PATIENT GOALS  ?To be able to keep moving with less Knee pain  ?  ?  ?OBJECTIVE:  ?  ?DIAGNOSTIC FINDINGS:  ? Lumbar x ray in May 2022:  IMPRESSION: ?Mild lower lumbar spondylosis, no acute osseous abnormality ?Lumbar MRI in July 2022:  IMPRESSION: ?Less than typical degenerative changes for age. No neural ?impingement or visible inflammation. ? ?Knees x rays in May 2022:  IMPRESSION: ?No acute osseous abnormality.  Mild degenerative joint disease. ?PATIENT SURVEYS:  ?FOTO 41% ability 55 % expected in 11 visits  ?  ?COGNITION: ?         Overall cognitive status: Within functional limits for tasks assessed              ?          ?SENSATION: ?         Light touch: Appears intact ?         Stereognosis: Appears intact ?         Hot/Cold: Appears intact ?         Proprioception: Appears intact ?  ?MUSCLE LENGTH: ?  ?POSTURE:  ?Mild forward head  ?  ?PALPATION: ?No unexpected tenderness to palpation  ?  ?LE AROM/PROM: ?  ?No significant pain with end range bilateral knee flexion. Full knee extension bilateral  ?  ?LE MMT: ?  ?MMT Right ?04/12/2021 Left ?04/12/2021  ?Hip flexion 10.3 9.9  ?Hip extension      ?Hip abduction 28.9 25.6  ?Hip adduction      ?Hip internal rotation      ?Hip external rotation      ?Knee flexion      ?Knee extension 29.6 17.8  ?Ankle dorsiflexion      ?Ankle plantarflexion      ?Ankle inversion      ?Ankle eversion      ? (Blank rows = not tested) ?  ?  ?FUNCTIONAL TESTS:  ?Slow transfer from sit to stand from a low surface  ? ?GAIT: ?Mild decrease in hip flexion and lateral movement. She reports she feels safer with a walker but hers is too difficult to get in and out of the car. She was advised she can request a  prescription from her MD for a walker.  ?  ?  ?TODAY'S TREATMENT: ?Exercises ?Seated Knee Extension with Resistance 3 sets - 10 reps yellow  ?Seated Hip  Abduction with Resistance - 3 sets - 10 reps yellow  ?Seated Knee Lifts with Resistance 3 sets - 10 reps yellow  ?  ?  ?  ?PATIENT EDUCATION:  ?Education details: reviewed HEP and symptom management  ?Person educated: Patient ?Education method: Explanation, Demonstration, Tactile cues, and Verbal cues ?Education comprehension: verbalized understanding, returned demonstration, verbal cues required, tactile cues required, and needs further education ?  ?  ?HOME EXERCISE PROGRAM: ?Access Code: [email or web address] ?URL: [email or web address] ?Date: 04/12/2021 ?Prepared by: Carolyne Littles ?  ?Exercises ?Seated Knee Extension with Resistance - 1 x daily - 7 x weekly - 3 sets - 10 reps ?Seated Hip Abduction with Resistance - 1 x daily - 7 x weekly - 3 sets - 10 reps ?Seated Knee Lifts with Resistance - 1 x daily - 7 x weekly - 3 sets - 10 reps ?  ?  ?ASSESSMENT: ?  ?CLINICAL IMPRESSION: ?Patient is an 81 y.o female who was seen today for physical therapy evaluation and treatment for bilateral knee pain L> R. She presents with decreased strength with left knee extension. She also has decreased abductor strength compared to age related norms, but only mild. She has not had pain since her shot but she would like to work on a program to decrease her strength. She will be moving soon as well. She would benefit from skilled therapy to increase strength and stability of bilateral LE in order to perform ADL's/ IADL's  ?  ?Objective impairments include Abnormal gait, decreased activity tolerance, decreased endurance, decreased mobility, difficulty walking, decreased strength, and pain. These impairments are limiting patient from cleaning, community activity, meal prep, yard work, and shopping. Personal factors including Age and 1-2 comorbidities: DMII, depression, anxiety   are  also affecting patient's functional outcome. Patient will benefit from skilled PT to address above impairments and improve overall function. ?  ?REHAB POTENTIAL: Good ?  ?CLINICAL DECISION MAKING: Stable/uncompli

## 2021-07-19 NOTE — Telephone Encounter (Signed)
Requesting a callback from assistant In regards to breast pain. Need a referral out to speak with a breast surgeon.  ?

## 2021-07-19 NOTE — Telephone Encounter (Signed)
Pt scheduled for 5/8 @ 2.20pm to discuss breast pain ?

## 2021-07-24 ENCOUNTER — Ambulatory Visit (INDEPENDENT_AMBULATORY_CARE_PROVIDER_SITE_OTHER): Payer: Medicare HMO

## 2021-07-24 ENCOUNTER — Ambulatory Visit (INDEPENDENT_AMBULATORY_CARE_PROVIDER_SITE_OTHER): Payer: Medicare HMO | Admitting: Internal Medicine

## 2021-07-24 ENCOUNTER — Ambulatory Visit: Payer: Medicare HMO | Admitting: Pulmonary Disease

## 2021-07-24 ENCOUNTER — Encounter: Payer: Self-pay | Admitting: Internal Medicine

## 2021-07-24 VITALS — BP 128/72 | HR 64 | Temp 98.1°F | Ht 59.0 in | Wt 150.0 lb

## 2021-07-24 DIAGNOSIS — R052 Subacute cough: Secondary | ICD-10-CM

## 2021-07-24 DIAGNOSIS — I10 Essential (primary) hypertension: Secondary | ICD-10-CM | POA: Diagnosis not present

## 2021-07-24 DIAGNOSIS — E118 Type 2 diabetes mellitus with unspecified complications: Secondary | ICD-10-CM

## 2021-07-24 DIAGNOSIS — R059 Cough, unspecified: Secondary | ICD-10-CM | POA: Diagnosis not present

## 2021-07-24 DIAGNOSIS — Z23 Encounter for immunization: Secondary | ICD-10-CM | POA: Diagnosis not present

## 2021-07-24 DIAGNOSIS — J069 Acute upper respiratory infection, unspecified: Secondary | ICD-10-CM | POA: Insufficient documentation

## 2021-07-24 MED ORDER — SHINGRIX 50 MCG/0.5ML IM SUSR: 0.5000 mL | Freq: Once | INTRAMUSCULAR | 1 refills | Status: AC

## 2021-07-24 MED ORDER — HYDROCODONE BIT-HOMATROP MBR 5-1.5 MG/5ML PO SOLN: 5.0000 mL | Freq: Three times a day (TID) | ORAL | 0 refills | Status: DC | PRN

## 2021-07-24 NOTE — Progress Notes (Signed)
? ?Subjective:  ?Patient ID: [name], female    DOB: 1941-01-26  Age: 81 y.o. MRN: [id number] ? ?CC: URI ? ? ?HPI ?[name] presents for f/up - ? ?She complains of a 3-day history of cough productive of clear phlegm with sore throat, runny nose, and nasal congestion. ? ? ?Outpatient Medications Prior to Visit  ?Medication Sig Dispense Refill  ? AMBULATORY NON FORMULARY MEDICATION Folding walker with front wheels.  ?Use as needed. ?Disp 1 ?R26.89 1 each 0  ? AMBULATORY NON FORMULARY MEDICATION Folding walker with front wheels. ?Dispense 1 ?Dx code: R26.89 ?Use as needed 1 Device 0  ? Blood Glucose Calibration (ACCU-CHEK GUIDE CONTROL) LIQD 1 Act by In Vitro route 2 (two) times daily as needed. 1 each 3  ? Blood Glucose Monitoring Suppl (ACCU-CHEK GUIDE ME) w/Device KIT 1 Act by Does not apply route 2 (two) times daily as needed. 2 kit 2  ? carvedilol (COREG) 3.125 MG tablet Take 1 tablet (3.125 mg total) by mouth 2 (two) times daily with a meal. 180 tablet 1  ? diclofenac Sodium (VOLTAREN) 1 % GEL Apply 4 g topically 4 (four) times daily. 100 g 5  ? diltiazem (TIADYLT ER) 180 MG 24 hr capsule Take 1 capsule (180 mg total) by mouth daily. 90 capsule 1  ? empagliflozin (JARDIANCE) 10 MG TABS tablet Take 1 tablet (10 mg total) by mouth daily before breakfast. 90 tablet 1  ? FLUoxetine (PROZAC) 40 MG capsule Take 1 capsule (40 mg total) by mouth daily. 90 capsule 1  ? glucose blood (ACCU-CHEK GUIDE) test strip 1 each by Other route 2 (two) times daily. 300 each 1  ? levothyroxine (SYNTHROID) 50 MCG tablet Take 1 tablet (50 mcg total) by mouth daily. 90 tablet 0  ? losartan (COZAAR) 100 MG tablet Take 1 tablet (100 mg total) by mouth daily. 90 tablet 1  ? metFORMIN (GLUCOPHAGE-XR) 750 MG 24 hr tablet TAKE 1 TABLET BY MOUTH EVERY DAY WITH BREAKFAST 90 tablet 0  ? mirabegron ER (MYRBETRIQ) 50 MG TB24 tablet Take 1 tablet (50 mg total) by mouth daily. 100 tablet 1  ? Multiple Vitamins-Minerals (CENTRUM SILVER PO)  Take by mouth daily.    ? potassium chloride SA (KLOR-CON M20) 20 MEQ tablet Take 1 tablet (20 mEq total) by mouth daily. 90 tablet 1  ? rosuvastatin (CRESTOR) 10 MG tablet Take 1 tablet (10 mg total) by mouth daily. 90 tablet 3  ? Suvorexant (BELSOMRA) 15 MG TABS Take 15 mg by mouth at bedtime as needed. 90 tablet 1  ? triamcinolone cream (KENALOG) 0.5 %     ? ?No facility-administered medications prior to visit.  ? ? ?ROS ?Review of Systems  ?Constitutional:  Negative for chills, diaphoresis, fatigue and fever.  ?HENT:  Positive for congestion and sore throat. Negative for nosebleeds, postnasal drip, rhinorrhea, sinus pain and trouble swallowing.   ?Eyes: Negative.   ?Respiratory:  Positive for cough. Negative for chest tightness, shortness of breath and wheezing.   ?Cardiovascular:  Negative for chest pain and leg swelling.  ?Gastrointestinal:  Negative for abdominal pain, blood in stool, constipation, diarrhea, nausea and vomiting.  ?Endocrine: Negative.   ?Genitourinary: Negative.  Negative for difficulty urinating.  ?Musculoskeletal: Negative.  Negative for arthralgias.  ?Skin: Negative.   ?Neurological: Negative.  Negative for dizziness, weakness, light-headedness and headaches.  ?Hematological:  Negative for adenopathy. Does not bruise/bleed easily.  ?Psychiatric/Behavioral: Negative.    ? ?Objective:  ?BP 128/72 (BP Location: Right Arm, Patient  Position: Sitting, Cuff Size: Large)   Pulse 64   Temp 98.1 ?F (36.7 ?C) (Oral)   Ht $R'4\' 11"'VQ$  (1.499 m)   Wt 150 lb (68 kg)   SpO2 98%   BMI 30.30 kg/m?  ? ?BP Readings from Last 3 Encounters:  ?07/24/21 128/72  ?06/15/21 132/84  ?05/22/21 (!) 144/86  ? ? ?Wt Readings from Last 3 Encounters:  ?07/24/21 150 lb (68 kg)  ?06/15/21 152 lb 6.4 oz (69.1 kg)  ?05/22/21 148 lb 12.8 oz (67.5 kg)  ? ? ?Physical Exam ?Vitals reviewed.  ?HENT:  ?   Nose: Nose normal.  ?   Mouth/Throat:  ?   Mouth: Mucous membranes are moist.  ?   Pharynx: No oropharyngeal exudate or  posterior oropharyngeal erythema.  ?Eyes:  ?   General: No scleral icterus. ?   Conjunctiva/sclera: Conjunctivae normal.  ?Cardiovascular:  ?   Rate and Rhythm: Normal rate and regular rhythm.  ?   Heart sounds: No murmur heard. ?Pulmonary:  ?   Effort: Pulmonary effort is normal.  ?   Breath sounds: No stridor. No wheezing, rhonchi or rales.  ?Abdominal:  ?   General: Abdomen is flat.  ?   Palpations: There is no mass.  ?   Tenderness: There is no abdominal tenderness. There is no guarding.  ?   Hernia: No hernia is present.  ?Musculoskeletal:     ?   General: Normal range of motion.  ?   Cervical back: Neck supple.  ?   Right lower leg: No edema.  ?   Left lower leg: No edema.  ?Lymphadenopathy:  ?   Cervical: No cervical adenopathy.  ?Skin: ?   General: Skin is warm and dry.  ?Neurological:  ?   General: No focal deficit present.  ?   Mental Status: She is alert.  ?Psychiatric:     ?   Mood and Affect: Mood normal.     ?   Behavior: Behavior normal.  ? ? ?Lab Results  ?Component Value Date  ? WBC 7.3 05/22/2021  ? HGB 12.3 05/22/2021  ? HCT 37.2 05/22/2021  ? PLT 272.0 05/22/2021  ? GLUCOSE 90 05/22/2021  ? CHOL 166 01/19/2020  ? TRIG 114.0 01/19/2020  ? HDL 64.80 01/19/2020  ? LDLDIRECT 120.6 11/24/2012  ? [place] 79 01/19/2020  ? ALT 18 03/08/2015  ? AST 19 03/08/2015  ? NA 142 05/22/2021  ? K 4.0 05/22/2021  ? CL 106 05/22/2021  ? CREATININE 0.86 05/22/2021  ? BUN 13 05/22/2021  ? CO2 27 05/22/2021  ? TSH 1.18 05/22/2021  ? INR 1.0 10/01/2011  ? HGBA1C 6.3 05/22/2021  ? MICROALBUR 1.2 01/05/2021  ? ? ?MM 3D SCREEN BREAST BILATERAL ? ?Result Date: 01/12/2021 ?CLINICAL DATA:  Screening. EXAM: DIGITAL SCREENING BILATERAL MAMMOGRAM WITH TOMOSYNTHESIS AND CAD TECHNIQUE: Bilateral screening digital craniocaudal and mediolateral oblique mammograms were obtained. Bilateral screening digital breast tomosynthesis was performed. The images were evaluated with computer-aided detection. COMPARISON:  Previous exam(s). ACR  Breast Density Category c: The breast tissue is heterogeneously dense, which may obscure small masses. FINDINGS: There are no findings suspicious for malignancy. IMPRESSION: No mammographic evidence of malignancy. A result letter of this screening mammogram will be mailed directly to the patient. RECOMMENDATION: Screening mammogram in one year. (Code:SM-B-01Y) BI-RADS CATEGORY  1: Negative. Electronically Signed   By: Margarette Canada M.D.   On: 01/12/2021 16:19  ? ?DG Chest 2 View ? ?Result Date: 07/24/2021 ?CLINICAL DATA:  Cough EXAM:  CHEST - 2 VIEW COMPARISON:  07/05/2020 FINDINGS: Cardiac and mediastinal contours are within normal limits. No focal pulmonary opacity. No pleural effusion or pneumothorax. No acute osseous abnormality. IMPRESSION: No acute cardiopulmonary process. Electronically Signed   By: Merilyn Baba M.D.   On: 07/24/2021 15:36    ? ?Assessment & Plan:  ? ?[name] was seen today for uri. ? ?Diagnoses and all orders for this visit: ? ?Essential hypertension- Her blood pressure is well controlled. ? ?Subacute cough- Chest x-ray is negative for mass or infiltrate. ?-     DG Chest 2 View; Future ?-     HYDROcodone bit-homatropine (HYCODAN) 5-1.5 MG/5ML syrup; Take 5 mLs by mouth every 8 (eight) hours as needed for cough. ? ?URI with cough and congestion ?-     HYDROcodone bit-homatropine (HYCODAN) 5-1.5 MG/5ML syrup; Take 5 mLs by mouth every 8 (eight) hours as needed for cough. ? ?Type II diabetes mellitus with manifestations ([place]) ?-     HM Diabetes Foot Exam ?-     Ambulatory referral to Ophthalmology ? ?Need for prophylactic vaccination and inoculation against varicella ?-     Zoster Vaccine Adjuvanted [facility]) injection; Inject 0.5 mLs into the muscle once for 1 dose. ? ?Other orders ?-     Pneumococcal polysaccharide vaccine 23-valent greater than or equal to 2yo subcutaneous/IM ? ? ?I am having [name] start on HYDROcodone bit-homatropine and Shingrix. I am also having her maintain her  Multiple Vitamins-Minerals (CENTRUM SILVER PO), diclofenac Sodium, triamcinolone cream, rosuvastatin, metFORMIN, Accu-Chek Guide Control, Accu-Chek Guide Me, carvedilol, Accu-Chek Guide, potassium chloride SA, levothyro

## 2021-07-24 NOTE — Patient Instructions (Signed)
Viral Respiratory Infection A respiratory infection is an illness that affects part of the respiratory system, such as the lungs, nose, or throat. A respiratory infection that is caused by a virus is called a viral respiratory infection. Common types of viral respiratory infections include: A cold. The flu (influenza). A respiratory syncytial virus (RSV) infection. What are the causes? This condition is caused by a virus. The virus may spread through contact with droplets or direct contact with infected people or their mucus or secretions. The virus may spread from person to person (is contagious). What are the signs or symptoms? Symptoms of this condition include: A stuffy or runny nose. A sore throat or cough. Shortness of breath or difficulty breathing. Yellow or green mucus (sputum). Other symptoms may include: A fever. Sweating or chills. Fatigue. Achy muscles. A headache. How is this diagnosed? This condition may be diagnosed based on: Your symptoms. A physical exam. Testing of secretions from the nose or throat. Chest X-ray. How is this treated? This condition may be treated with medicines, such as: Antiviral medicine. This may shorten the length of time a person has symptoms. Expectorants. These make it easier to cough up mucus. Decongestant nasal sprays. Acetaminophen or NSAIDs, such as ibuprofen, to relieve fever and pain. Antibiotic medicines are not prescribed for viral infections.This is because antibiotics are designed to kill bacteria. They do not kill viruses. Follow these instructions at home: Managing pain and congestion Take over-the-counter and prescription medicines only as told by your health care provider. If you have a sore throat, gargle with a mixture of salt and water 3-4 times a day or as needed. To make salt water, completely dissolve -1 tsp (3-6 g) of salt in 1 cup (237 mL) of warm water. Use nose drops made from salt water to ease congestion and  soften raw skin around your nose. Take 2 tsp (10 mL) of honey at bedtime to lessen coughing at night. Do not give honey to children who are younger than 1 year. Drink enough fluid to keep your urine pale yellow. This helps prevent dehydration and helps loosen up mucus. General instructions  Rest as much as possible. Do not drink alcohol. Do not use any products that contain nicotine or tobacco. These products include cigarettes, chewing tobacco, and vaping devices, such as e-cigarettes. If you need help quitting, ask your health care provider. Keep all follow-up visits. This is important. How is this prevented?     Get an annual flu shot. You may get the flu shot in late summer, fall, or winter. Ask your health care provider when you should get your flu shot. Avoid spreading your infection to other people. If you are sick: Wash your hands with soap and water often, especially after you cough or sneeze. Wash for at least 20 seconds. If soap and water are not available, use alcohol-based hand sanitizer. Cover your mouth when you cough. Cover your nose and mouth when you sneeze. Do not share cups or eating utensils. Clean commonly used objects often. Clean commonly touched surfaces. Stay home from work or school as told by your health care provider. Avoid contact with people who are sick during cold and flu season. This is generally fall and winter. Contact a health care provider if: Your symptoms last for 10 days or longer. Your symptoms get worse over time. You have severe sinus pain in your face or forehead. The glands in your jaw or neck become very swollen. You have shortness of breath. Get   help right away if you: Feel pain or pressure in your chest. Have trouble breathing. Faint or feel like you will faint. Have severe and persistent vomiting. Feel confused or disoriented. These symptoms may represent a serious problem that is an emergency. Do not wait to see if the symptoms will  go away. Get medical help right away. Call your local emergency services (911 in the [place] Do not drive yourself to the hospital. Summary A respiratory infection is an illness that affects part of the respiratory system, such as the lungs, nose, or throat. A respiratory infection that is caused by a virus is called a viral respiratory infection. Common types of viral respiratory infections include a cold, influenza, and respiratory syncytial virus (RSV) infection. Symptoms of this condition include a stuffy or runny nose, cough, fatigue, achy muscles, sore throat, and fevers or chills. Antibiotic medicines are not prescribed for viral infections. This is because antibiotics are designed to kill bacteria. They are not effective against viruses. This information is not intended to replace advice given to you by your health care provider. Make sure you discuss any questions you have with your health care provider. Document Revised: 06/09/2020 Document Reviewed: 06/09/2020 Elsevier Patient Education  2023 Elsevier Inc.  

## 2021-07-26 ENCOUNTER — Ambulatory Visit (HOSPITAL_BASED_OUTPATIENT_CLINIC_OR_DEPARTMENT_OTHER): Payer: Medicare HMO | Admitting: Physical Therapy

## 2021-07-26 NOTE — Therapy (Incomplete)
?OUTPATIENT PHYSICAL THERAPY TREATMENT NOTE ? ? ?Patient Name: [name] ?MRN: [id number] ?DOB:July 15, 1940, 81 y.o., female ?Today's Date: 07/26/2021 ? ?PCP: *** ?REFERRING PROVIDER: *** ? ?END OF SESSION:  ? ? ?Past Medical History:  ?Diagnosis Date  ? Allergic rhinitis   ? Anxiety   ? Arthritis   ? Chronic cough   ? Dr Melvyn Novas, onset 11/2007, Sinus CT July 7,2010>neg, Resolved 10/2008 on ppi two times a day  ? Colon polyp   ? Depression   ? Diabetes mellitus type II   ? diet  ? Diverticulosis   ? Dyslipidemia   ? GERD (gastroesophageal reflux disease)   ? Hiatal hernia   ? Hypertension   ? Hypothyroidism   ? Lung nodule   ? Obesity (BMI 30-39.9)   ? Pneumonia   ? Sleep apnea   ? Dr Gwenette Greet CPAP 2009  ? ?Past Surgical History:  ?Procedure Laterality Date  ? ABDOMINAL HYSTERECTOMY    ? APPENDECTOMY    ? BLADDER SUSPENSION    ? CHOLECYSTECTOMY    ? 1998 in [place]  ? EYE SURGERY  10/13  ? and 12/13; cataract surgery  ? Left breast lumpectomy    ? NISSEN FUNDOPLICATION    ? March '11 Dr Johney Maine  ? RECTOCELE REPAIR    ? TONSILLECTOMY    ? x 2  ? VENTRAL HERNIA REPAIR    ? ?Patient Active Problem List  ? Diagnosis Date Noted  ? URI with cough and congestion 07/24/2021  ? Deficiency anemia 05/22/2021  ? Chronic heart failure with preserved ejection fraction ([place]) 05/22/2021  ? OAB (overactive bladder) 05/22/2021  ? Subacute cough 02/20/2021  ? Impaired gait and mobility 01/17/2021  ? Bilateral hearing loss due to cerumen impaction 12/23/2020  ? Psychophysiological insomnia 12/22/2020  ? Visit for screening mammogram 11/08/2020  ? Recurrent major depressive disorder, in full remission ([place]) 05/26/2020  ? Sensorineural hearing loss (SNHL) of both ears 05/26/2020  ? Routine general medical examination at a health care facility 01/22/2020  ? Stage 3b chronic kidney disease ([place]) 01/20/2020  ? Acromioclavicular arthrosis 12/30/2014  ? GAD (generalized anxiety disorder) 08/19/2014  ? Hyperlipidemia with target LDL less than 100  06/25/2013  ? GERD 12/03/2008  ? OBSTRUCTIVE SLEEP APNEA 05/22/2007  ? Type II diabetes mellitus with manifestations ([place]) 02/19/2007  ? Allergic rhinitis 02/19/2007  ? Hypothyroidism 12/21/2006  ? Essential hypertension 12/21/2006  ? ?PCP: Janith Lima, MD ?  ?REFERRING PROVIDER: Gregor Hams, MD ?  ?REFERRING DIAG:  ?M25.561,M25.562,G89.29 (ICD-10-CM) - Chronic pain of both knees  ?M54.16 (ICD-10-CM) - Lumbar radiculopathy  ?  ?  ?THERAPY DIAG:  ?Chronic pain of left knee ?  ?Chronic pain of right knee ?  ?Other abnormalities of gait and mobility ?  ?ONSET DATE: Several years  ?  ?SUBJECTIVE:  ?  ?SUBJECTIVE STATEMENT: ?Pt received bilat knee injections on 03/16/2021 and had improved sx's in bilat knees.   Pt was evaluated by PT on 04/11/2021 received a HEP.  Cx???? ? ?Pt received bilat US guided injections to bilat knees on 06/15/2021 ? ?She returned to MD and note indicates pt havi some lower leg pain that could be due to L5 lumbar radiculopathy bilaterally right worse than left.  However the majority of her pain is due to her knees.  ? ?She lives at [facility]  is moving to a new apartment/assisted living facility ? ?Patient has periodic knee pain with activity. She has shots every few months. She reports  the recent round of shots have worked. She is having very little pain at this time. She also has back pain on her script but she reports her back pain has not been much of an issue. She would like to focus on the knees.  ?  ?PERTINENT HISTORY: ?Anxiety, depression, DMII, diverticulosis  ?  ?PAIN:  ?Are you having pain? Yes ?NPRS scale: 0/10 as long as they stay wamr. Patien reports the shots ward off the pain  ?Pain location: bilateral knees  ?Pain orientation: Bilateral in bilateral knees and quads. Pain can also travel down to her foot at times  ?PAIN TYPE: aching ?Pain description: intermittent  ?Aggravating factors: the weather  ?Relieving factors: voltarin,  ?  ?PRECAUTIONS: None ?   ?WEIGHT BEARING RESTRICTIONS No ?  ?FALLS:  ?Has patient fallen in last 6 months? No, Number of falls: did get down on her knees and couldn't get up ?  ?LIVING ENVIRONMENT: ?No steps or stairs  ?  ?  ?OCCUPATION: Retired   ?  ?Hobbies: Walking  ?  ?PLOF: Independent with basic ADLs.  She lives at [facility] ?  ?PATIENT GOALS  ?To be able to keep moving with less Knee pain  ?  ?  ?OBJECTIVE:  ?  ?DIAGNOSTIC FINDINGS:  ? Lumbar x ray in May 2022:  IMPRESSION: ?Mild lower lumbar spondylosis, no acute osseous abnormality ?Lumbar MRI in July 2022:  IMPRESSION: ?Less than typical degenerative changes for age. No neural ?impingement or visible inflammation. ? ?Knees x rays in May 2022:  IMPRESSION: ?No acute osseous abnormality.  Mild degenerative joint disease. ?PATIENT SURVEYS:  ?FOTO 41% ability 55 % expected in 11 visits  ?  ?COGNITION: ?         Overall cognitive status: Within functional limits for tasks assessed              ?          ?SENSATION: ?         Light touch: Appears intact ?         Stereognosis: Appears intact ?         Hot/Cold: Appears intact ?         Proprioception: Appears intact ?  ?MUSCLE LENGTH: ?  ?POSTURE:  ?Mild forward head  ?  ?PALPATION: ?No unexpected tenderness to palpation  ?  ?LE AROM/PROM: ?  ?No significant pain with end range bilateral knee flexion. Full knee extension bilateral  ?  ?LE MMT: ?  ?MMT Right ?04/12/2021 Left ?04/12/2021  ?Hip flexion 10.3 9.9  ?Hip extension      ?Hip abduction 28.9 25.6  ?Hip adduction      ?Hip internal rotation      ?Hip external rotation      ?Knee flexion      ?Knee extension 29.6 17.8  ?Ankle dorsiflexion      ?Ankle plantarflexion      ?Ankle inversion      ?Ankle eversion      ? (Blank rows = not tested) ?  ?  ?FUNCTIONAL TESTS:  ?Slow transfer from sit to stand from a low surface  ? ?GAIT: ?Mild decrease in hip flexion and lateral movement. She reports she feels safer with a walker but hers is too difficult to get in and out of  the car. She was advised she can request a prescription from her MD for a walker.  ?  ?  ?TODAY'S TREATMENT: ?Exercises ?Seated Knee Extension with Resistance 3  sets - 10 reps yellow  ?Seated Hip Abduction with Resistance - 3 sets - 10 reps yellow  ?Seated Knee Lifts with Resistance 3 sets - 10 reps yellow  ?  ?  ?  ?PATIENT EDUCATION:  ?Education details: reviewed HEP and symptom management  ?Person educated: Patient ?Education method: Explanation, Demonstration, Tactile cues, and Verbal cues ?Education comprehension: verbalized understanding, returned demonstration, verbal cues required, tactile cues required, and needs further education ?  ?  ?HOME EXERCISE PROGRAM: ?Access Code: [email or web address] ?URL: [email or web address] ?Date: 04/12/2021 ?Prepared by: Carolyne Littles ?  ?Exercises ?Seated Knee Extension with Resistance - 1 x daily - 7 x weekly - 3 sets - 10 reps ?Seated Hip Abduction with Resistance - 1 x daily - 7 x weekly - 3 sets - 10 reps ?Seated Knee Lifts with Resistance - 1 x daily - 7 x weekly - 3 sets - 10 reps ?  ?  ?ASSESSMENT: ?  ?CLINICAL IMPRESSION: ?Patient is an 81 y.o female who was seen today for physical therapy evaluation and treatment for bilateral knee pain L> R. She presents with decreased strength with left knee extension. She also has decreased abductor strength compared to age related norms, but only mild. She has not had pain since her shot but she would like to work on a program to decrease her strength. She will be moving soon as well. She would benefit from skilled therapy to increase strength and stability of bilateral LE in order to perform ADL's/ IADL's  ?  ?Objective impairments include Abnormal gait, decreased activity tolerance, decreased endurance, decreased mobility, difficulty walking, decreased strength, and pain. These impairments are limiting patient from cleaning, community activity, meal prep, yard work, and shopping. Personal factors including Age and 1-2  comorbidities: DMII, depression, anxiety   are also affecting patient's functional outcome. Patient will benefit from skilled PT to address above impairments and improve overall function. ?  ?REHAB POTENTIAL: Good

## 2021-08-02 ENCOUNTER — Ambulatory Visit (HOSPITAL_BASED_OUTPATIENT_CLINIC_OR_DEPARTMENT_OTHER): Payer: Medicare HMO | Admitting: Physical Therapy

## 2021-08-10 DIAGNOSIS — R41841 Cognitive communication deficit: Secondary | ICD-10-CM | POA: Diagnosis not present

## 2021-08-14 DIAGNOSIS — R41841 Cognitive communication deficit: Secondary | ICD-10-CM | POA: Diagnosis not present

## 2021-08-15 ENCOUNTER — Other Ambulatory Visit: Payer: Self-pay | Admitting: Internal Medicine

## 2021-08-15 DIAGNOSIS — E039 Hypothyroidism, unspecified: Secondary | ICD-10-CM

## 2021-08-16 IMAGING — DX DG CHEST 2V
2 series · 2 of 2 positions shown · non-contrast
Comparison: March 08, 2015

CLINICAL DATA: Cough and shortness of breath

EXAM:
CHEST - 2 VIEW

[chest pa]
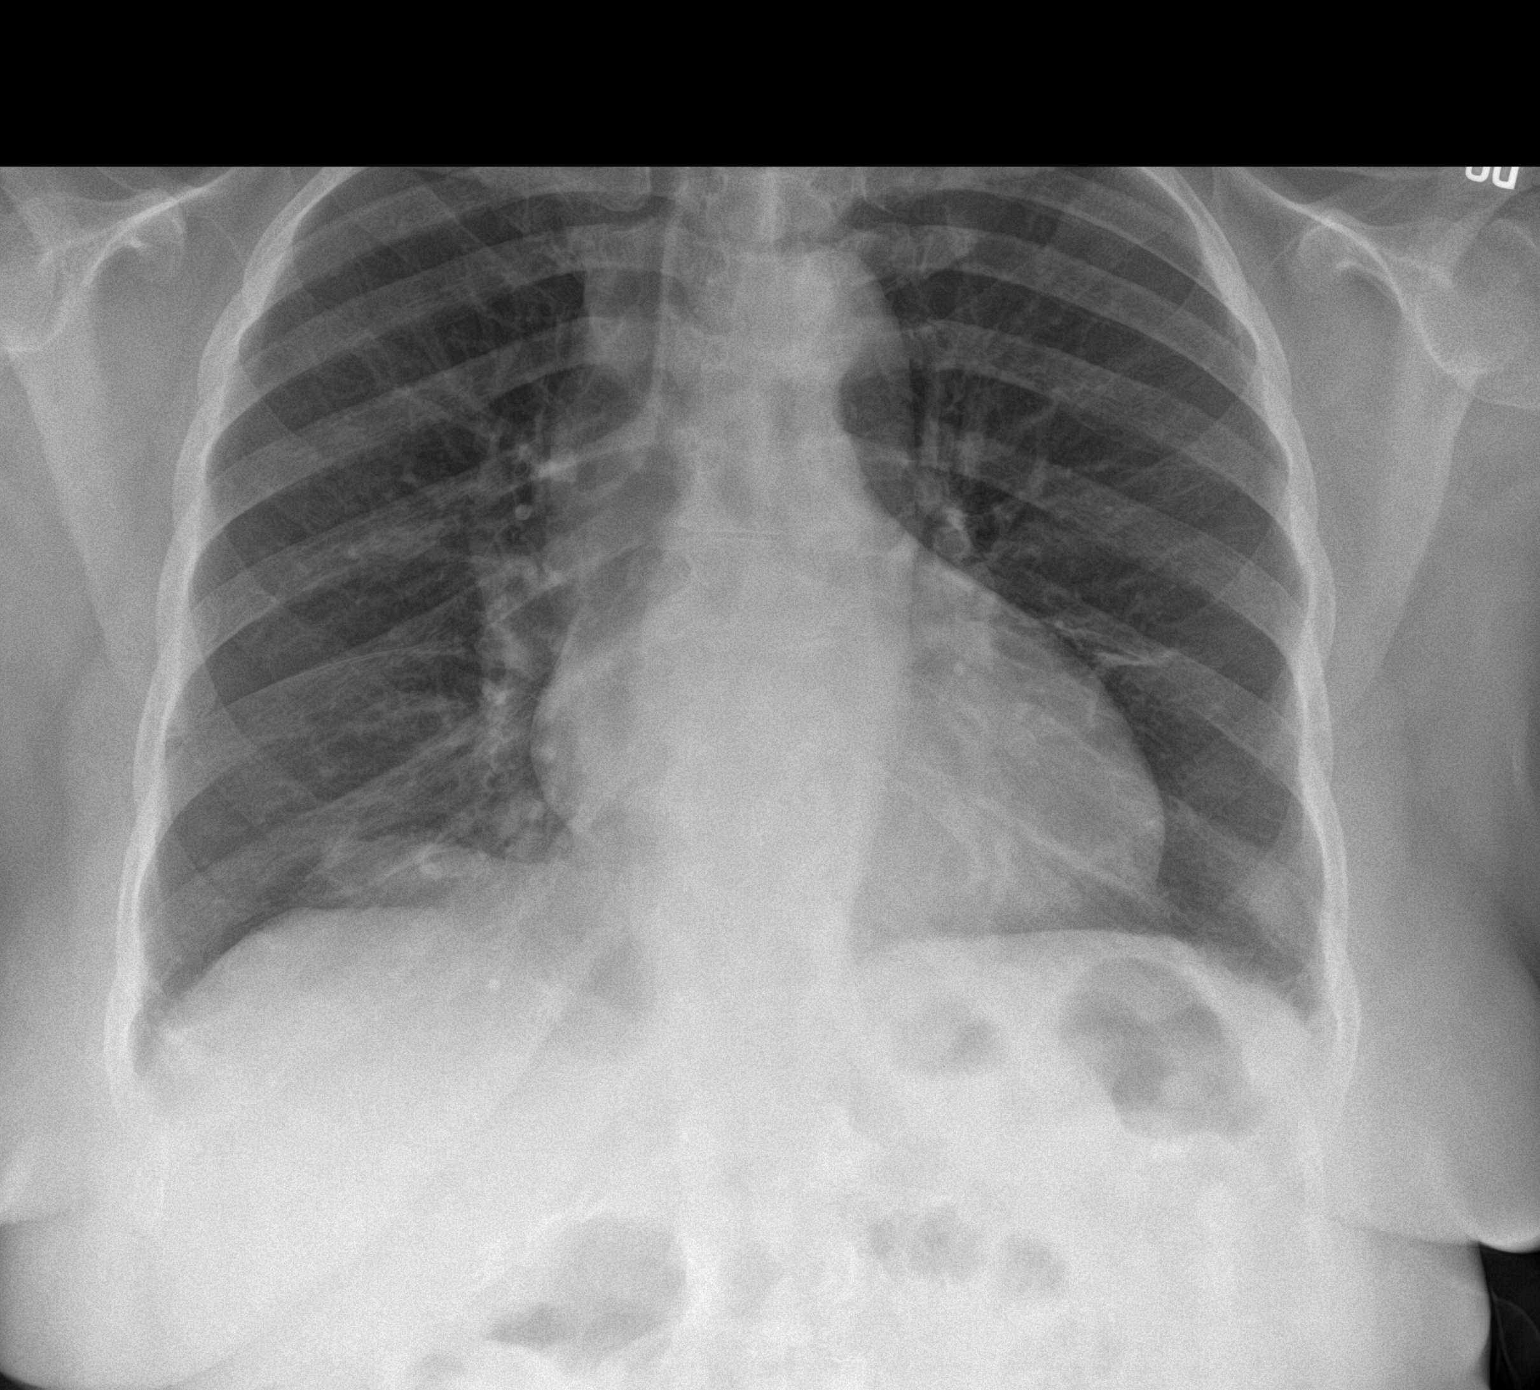

[chest lat]
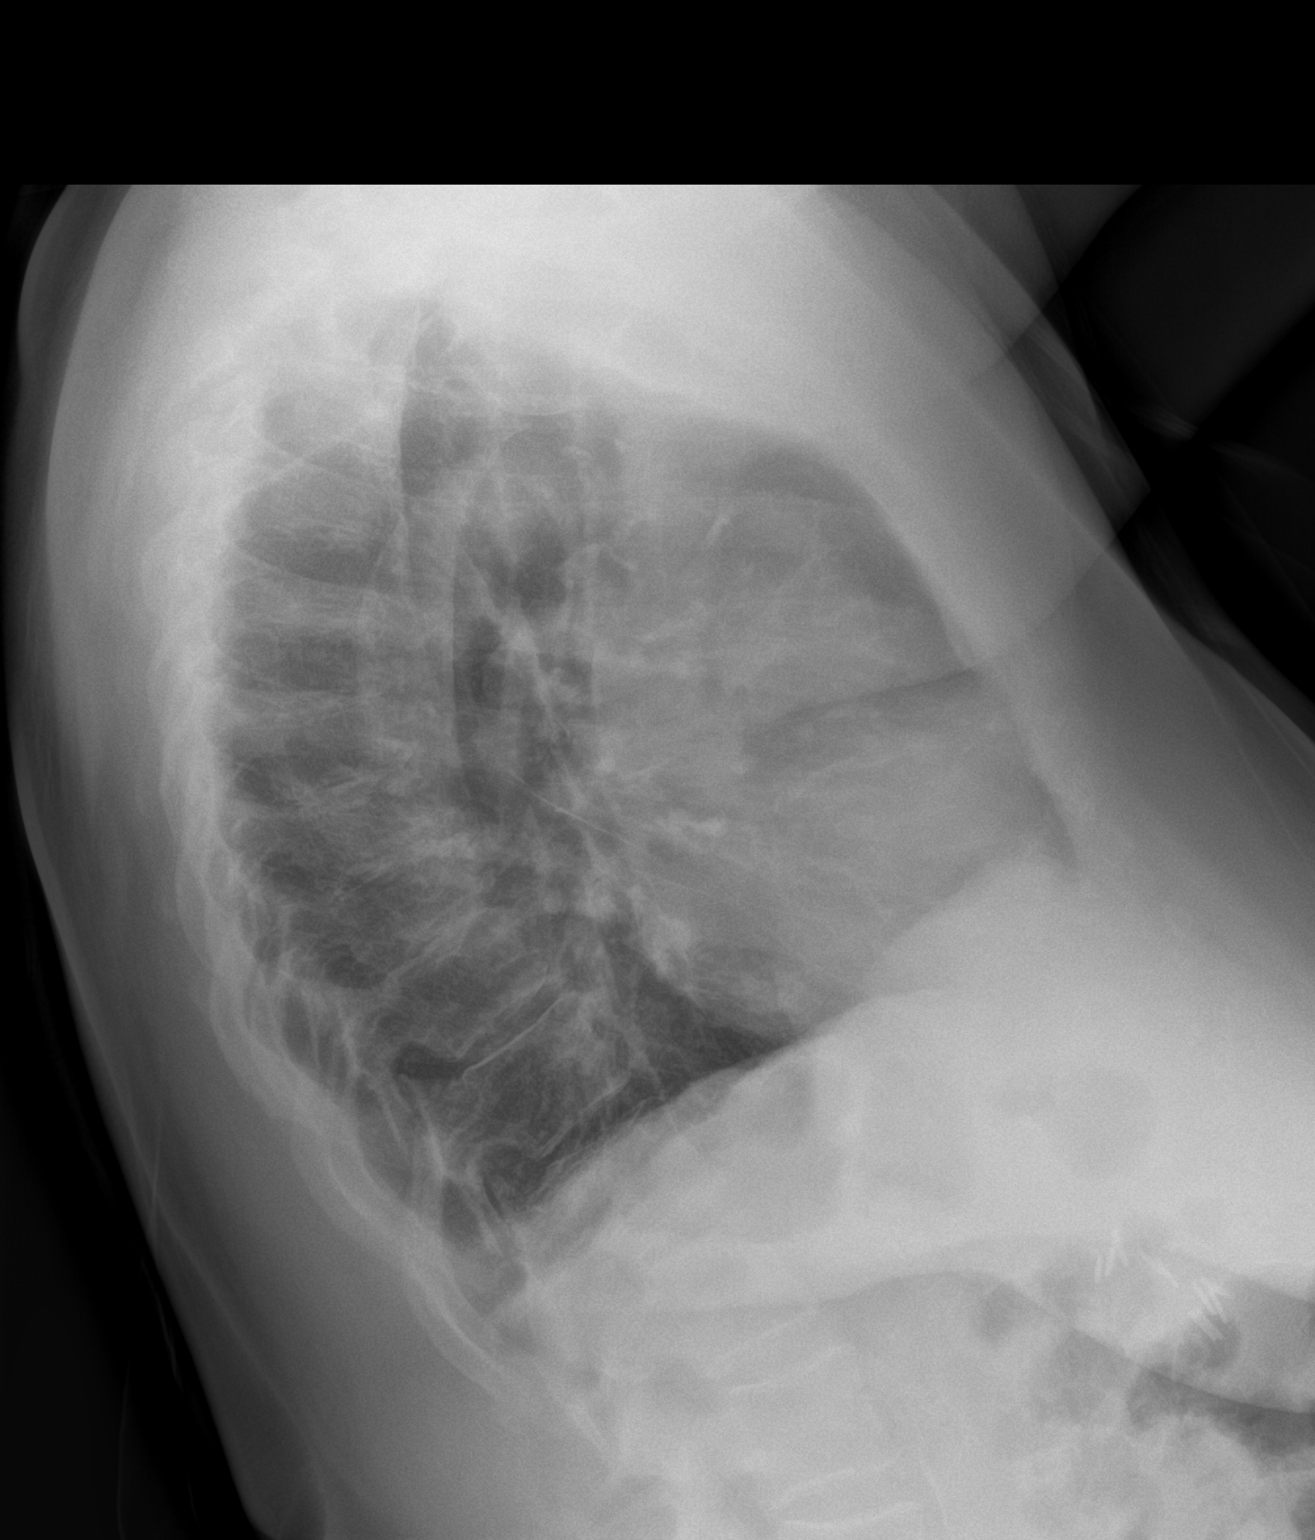

[2 of 2 positions shown; findings below may reference images not displayed]

FINDINGS: There is slight atelectatic change in the left lower lobe. Lungs
otherwise are clear. Heart is upper normal in size with pulmonary
vascularity normal. No adenopathy. There is degenerative change in
the thoracic spine with slight anterior wedging of a lower thoracic
vertebral body.
IMPRESSION: Slight left lower lung region atelectasis. Lungs otherwise clear.
Heart upper normal in size.

## 2021-08-17 DIAGNOSIS — R41841 Cognitive communication deficit: Secondary | ICD-10-CM | POA: Diagnosis not present

## 2021-08-23 DIAGNOSIS — R41841 Cognitive communication deficit: Secondary | ICD-10-CM | POA: Diagnosis not present

## 2021-08-28 DIAGNOSIS — R41841 Cognitive communication deficit: Secondary | ICD-10-CM | POA: Diagnosis not present

## 2021-09-05 DIAGNOSIS — R41841 Cognitive communication deficit: Secondary | ICD-10-CM | POA: Diagnosis not present

## 2021-09-05 DIAGNOSIS — R296 Repeated falls: Secondary | ICD-10-CM | POA: Diagnosis not present

## 2021-09-05 DIAGNOSIS — M6281 Muscle weakness (generalized): Secondary | ICD-10-CM | POA: Diagnosis not present

## 2021-09-06 DIAGNOSIS — R296 Repeated falls: Secondary | ICD-10-CM | POA: Diagnosis not present

## 2021-09-06 DIAGNOSIS — M6281 Muscle weakness (generalized): Secondary | ICD-10-CM | POA: Diagnosis not present

## 2021-09-08 DIAGNOSIS — R262 Difficulty in walking, not elsewhere classified: Secondary | ICD-10-CM | POA: Diagnosis not present

## 2021-09-08 DIAGNOSIS — M25561 Pain in right knee: Secondary | ICD-10-CM | POA: Diagnosis not present

## 2021-09-08 DIAGNOSIS — M25562 Pain in left knee: Secondary | ICD-10-CM | POA: Diagnosis not present

## 2021-09-08 DIAGNOSIS — R41841 Cognitive communication deficit: Secondary | ICD-10-CM | POA: Diagnosis not present

## 2021-09-11 DIAGNOSIS — R41841 Cognitive communication deficit: Secondary | ICD-10-CM | POA: Diagnosis not present

## 2021-09-11 DIAGNOSIS — R296 Repeated falls: Secondary | ICD-10-CM | POA: Diagnosis not present

## 2021-09-11 DIAGNOSIS — M6281 Muscle weakness (generalized): Secondary | ICD-10-CM | POA: Diagnosis not present

## 2021-09-12 DIAGNOSIS — R296 Repeated falls: Secondary | ICD-10-CM | POA: Diagnosis not present

## 2021-09-12 DIAGNOSIS — M25561 Pain in right knee: Secondary | ICD-10-CM | POA: Diagnosis not present

## 2021-09-12 DIAGNOSIS — M25562 Pain in left knee: Secondary | ICD-10-CM | POA: Diagnosis not present

## 2021-09-12 DIAGNOSIS — R262 Difficulty in walking, not elsewhere classified: Secondary | ICD-10-CM | POA: Diagnosis not present

## 2021-09-12 DIAGNOSIS — M6281 Muscle weakness (generalized): Secondary | ICD-10-CM | POA: Diagnosis not present

## 2021-09-13 DIAGNOSIS — M25562 Pain in left knee: Secondary | ICD-10-CM | POA: Diagnosis not present

## 2021-09-13 DIAGNOSIS — M6281 Muscle weakness (generalized): Secondary | ICD-10-CM | POA: Diagnosis not present

## 2021-09-13 DIAGNOSIS — R262 Difficulty in walking, not elsewhere classified: Secondary | ICD-10-CM | POA: Diagnosis not present

## 2021-09-13 DIAGNOSIS — M25561 Pain in right knee: Secondary | ICD-10-CM | POA: Diagnosis not present

## 2021-09-13 DIAGNOSIS — R296 Repeated falls: Secondary | ICD-10-CM | POA: Diagnosis not present

## 2021-09-14 ENCOUNTER — Ambulatory Visit: Payer: Medicare HMO | Admitting: Family Medicine

## 2021-09-14 ENCOUNTER — Ambulatory Visit: Payer: Self-pay

## 2021-09-14 ENCOUNTER — Encounter: Payer: Self-pay | Admitting: Internal Medicine

## 2021-09-14 ENCOUNTER — Ambulatory Visit (INDEPENDENT_AMBULATORY_CARE_PROVIDER_SITE_OTHER): Payer: Medicare HMO | Admitting: Internal Medicine

## 2021-09-14 VITALS — BP 126/74 | HR 63 | Temp 98.2°F | Ht 59.0 in | Wt 148.0 lb

## 2021-09-14 VITALS — BP 142/84 | HR 68 | Ht 59.0 in | Wt 148.4 lb

## 2021-09-14 DIAGNOSIS — E118 Type 2 diabetes mellitus with unspecified complications: Secondary | ICD-10-CM | POA: Diagnosis not present

## 2021-09-14 DIAGNOSIS — F02A0 Dementia in other diseases classified elsewhere, mild, without behavioral disturbance, psychotic disturbance, mood disturbance, and anxiety: Secondary | ICD-10-CM

## 2021-09-14 DIAGNOSIS — N3281 Overactive bladder: Secondary | ICD-10-CM | POA: Diagnosis not present

## 2021-09-14 DIAGNOSIS — I5032 Chronic diastolic (congestive) heart failure: Secondary | ICD-10-CM

## 2021-09-14 DIAGNOSIS — M25562 Pain in left knee: Secondary | ICD-10-CM | POA: Diagnosis not present

## 2021-09-14 DIAGNOSIS — G8929 Other chronic pain: Secondary | ICD-10-CM | POA: Diagnosis not present

## 2021-09-14 DIAGNOSIS — G301 Alzheimer's disease with late onset: Secondary | ICD-10-CM | POA: Diagnosis not present

## 2021-09-14 DIAGNOSIS — Z0000 Encounter for general adult medical examination without abnormal findings: Secondary | ICD-10-CM

## 2021-09-14 DIAGNOSIS — M25561 Pain in right knee: Secondary | ICD-10-CM

## 2021-09-14 DIAGNOSIS — N1832 Chronic kidney disease, stage 3b: Secondary | ICD-10-CM

## 2021-09-14 DIAGNOSIS — E785 Hyperlipidemia, unspecified: Secondary | ICD-10-CM

## 2021-09-14 LAB — HEMOGLOBIN A1C: Hgb A1c MFr Bld: 5.9 % (ref 4.6–6.5)

## 2021-09-14 LAB — LIPID PANEL
Cholesterol: 150 mg/dL (ref 0–200)
HDL: 72 mg/dL (ref >39.00)
LDL Cholesterol: 59 mg/dL (ref 0–99)
NonHDL: 78.11
Total CHOL/HDL Ratio: 2
Triglycerides: 95 mg/dL (ref 0.0–149.0)
VLDL: 19 mg/dL (ref 0.0–40.0)

## 2021-09-14 LAB — HEPATIC FUNCTION PANEL
ALT: 24 U/L (ref 0–35)
AST: 28 U/L (ref 0–37)
Albumin: 4.3 g/dL (ref 3.5–5.2)
Alkaline Phosphatase: 70 U/L (ref 39–117)
Bilirubin, Direct: 0.1 mg/dL (ref 0.0–0.3)
Total Bilirubin: 0.6 mg/dL (ref 0.2–1.2)
Total Protein: 6.8 g/dL (ref 6.0–8.3)

## 2021-09-14 LAB — BASIC METABOLIC PANEL
BUN: 17 mg/dL (ref 6–23)
CO2: 24 mEq/L (ref 19–32)
Calcium: 10 mg/dL (ref 8.4–10.5)
Chloride: 106 mEq/L (ref 96–112)
Creatinine, Ser: 0.93 mg/dL (ref 0.40–1.20)
GFR: 57.33 mL/min — ABNORMAL LOW (ref >60.00)
Glucose, Bld: 95 mg/dL (ref 70–99)
Potassium: 4.3 mEq/L (ref 3.5–5.1)
Sodium: 141 mEq/L (ref 135–145)

## 2021-09-14 LAB — BRAIN NATRIURETIC PEPTIDE: Pro B Natriuretic peptide (BNP): 170 pg/mL — ABNORMAL HIGH (ref 0.0–100.0)

## 2021-09-14 MED ORDER — MEMANTINE HCL 5 MG PO TABS: 5.0000 mg | ORAL_TABLET | Freq: Two times a day (BID) | ORAL | 0 refills | Status: DC

## 2021-09-14 MED ORDER — DONEPEZIL HCL 5 MG PO TBDP: 5.0000 mg | ORAL_TABLET | Freq: Every day | ORAL | 0 refills | Status: DC

## 2021-09-14 MED ORDER — MIRABEGRON ER 50 MG PO TB24: 50.0000 mg | ORAL_TABLET | Freq: Every day | ORAL | 1 refills | Status: DC

## 2021-09-14 NOTE — Patient Instructions (Addendum)
Thank you for coming in today.   You received steroid injections in both of your knees today. Seek immediate medical attention if the joint becomes red, extremely painful, or is oozing fluid.   Check back in 3 months, sooner if you aren't doing well.

## 2021-09-14 NOTE — Progress Notes (Signed)
Subjective:  Patient ID: [name], female    DOB: 05/27/1940  Age: 81 y.o. MRN: [id number]  CC: Annual Exam, Hypertension, Congestive Heart Failure, Hyperlipidemia, Hypothyroidism, and Diabetes   HPI [name] presents for a CPX and f/up -   She complains of declining memory and wants to try a medication to treat this.  She is active and denies chest pain, shortness of breath, diaphoresis, or edema.   Outpatient Medications Prior to Visit  Medication Sig Dispense Refill   AMBULATORY NON FORMULARY MEDICATION Folding walker with front wheels.  Use as needed. Disp 1 R26.89 1 each 0   AMBULATORY NON FORMULARY MEDICATION Folding walker with front wheels. Dispense 1 Dx code: R26.89 Use as needed 1 Device 0   Blood Glucose Calibration (ACCU-CHEK GUIDE CONTROL) LIQD 1 Act by In Vitro route 2 (two) times daily as needed. 1 each 3   Blood Glucose Monitoring Suppl (ACCU-CHEK GUIDE ME) w/Device KIT 1 Act by Does not apply route 2 (two) times daily as needed. 2 kit 2   carvedilol (COREG) 3.125 MG tablet Take 1 tablet (3.125 mg total) by mouth 2 (two) times daily with a meal. 180 tablet 1   diclofenac Sodium (VOLTAREN) 1 % GEL Apply 4 g topically 4 (four) times daily. 100 g 5   diltiazem (TIADYLT ER) 180 MG 24 hr capsule Take 1 capsule (180 mg total) by mouth daily. 90 capsule 1   empagliflozin (JARDIANCE) 10 MG TABS tablet Take 1 tablet (10 mg total) by mouth daily before breakfast. 90 tablet 1   FLUoxetine (PROZAC) 40 MG capsule Take 1 capsule (40 mg total) by mouth daily. 90 capsule 1   glucose blood (ACCU-CHEK GUIDE) test strip 1 each by Other route 2 (two) times daily. 300 each 1   levothyroxine (SYNTHROID) 50 MCG tablet TAKE 1 TABLET EVERY DAY 90 tablet 0   losartan (COZAAR) 100 MG tablet Take 1 tablet (100 mg total) by mouth daily. 90 tablet 1   Multiple Vitamins-Minerals (CENTRUM SILVER PO) Take by mouth daily.     potassium chloride SA (KLOR-CON M20) 20 MEQ tablet Take 1  tablet (20 mEq total) by mouth daily. 90 tablet 1   rosuvastatin (CRESTOR) 10 MG tablet Take 1 tablet (10 mg total) by mouth daily. 90 tablet 3   triamcinolone cream (KENALOG) 0.5 %      metFORMIN (GLUCOPHAGE-XR) 750 MG 24 hr tablet TAKE 1 TABLET BY MOUTH EVERY DAY WITH BREAKFAST 90 tablet 0   mirabegron ER (MYRBETRIQ) 50 MG TB24 tablet Take 1 tablet (50 mg total) by mouth daily. 100 tablet 1   Suvorexant (BELSOMRA) 15 MG TABS Take 15 mg by mouth at bedtime as needed. 90 tablet 1   No facility-administered medications prior to visit.    ROS Review of Systems  Constitutional: Negative.  Negative for diaphoresis, fatigue and unexpected weight change.  HENT: Negative.    Eyes: Negative.   Respiratory:  Negative for cough, chest tightness, shortness of breath and wheezing.   Cardiovascular:  Negative for chest pain, palpitations and leg swelling.  Gastrointestinal:  Negative for abdominal pain, constipation, diarrhea, nausea and vomiting.  Endocrine: Positive for polyuria. Negative for polydipsia and polyphagia.  Genitourinary:  Positive for frequency. Negative for difficulty urinating.  Musculoskeletal: Negative.  Negative for arthralgias.  Skin: Negative.   Neurological: Negative.  Negative for dizziness, weakness, light-headedness and numbness.  Hematological:  Negative for adenopathy. Does not bruise/bleed easily.  Psychiatric/Behavioral:  Positive for confusion and decreased concentration.  Negative for dysphoric mood and sleep disturbance. The patient is not nervous/anxious.     Objective:  BP 126/74 (BP Location: Right Arm, Patient Position: Sitting, Cuff Size: Large)   Pulse 63   Temp 98.2 F (36.8 C) (Oral)   Ht _0  (1.499 m)   Wt 148 lb (67.1 kg)   SpO2 99%   BMI 29.89 kg/m   BP Readings from Last 3 Encounters:  09/14/21 126/74  09/14/21 (!) 142/84  07/24/21 128/72    Wt Readings from Last 3 Encounters:  09/14/21 148 lb (67.1 kg)  09/14/21 148 lb 6.4 oz (67.3 kg)   07/24/21 150 lb (68 kg)    Physical Exam Vitals reviewed.  Constitutional:      Appearance: She is not ill-appearing.  HENT:     Nose: Nose normal.     Mouth/Throat:     Mouth: Mucous membranes are moist.  Eyes:     General: No scleral icterus.    Conjunctiva/sclera: Conjunctivae normal.  Cardiovascular:     Rate and Rhythm: Normal rate and regular rhythm.     Heart sounds: No murmur heard. Pulmonary:     Effort: Pulmonary effort is normal.     Breath sounds: No stridor. No wheezing, rhonchi or rales.  Abdominal:     General: Abdomen is flat.     Palpations: There is no mass.     Tenderness: There is no abdominal tenderness. There is no guarding.     Hernia: No hernia is present.  Musculoskeletal:        General: Normal range of motion.     Cervical back: Neck supple.     Right lower leg: No edema.     Left lower leg: No edema.  Lymphadenopathy:     Cervical: No cervical adenopathy.  Skin:    General: Skin is warm and dry.  Neurological:     General: No focal deficit present.     Mental Status: She is alert.  Psychiatric:        Mood and Affect: Mood normal.        Behavior: Behavior normal.     Lab Results  Component Value Date   WBC 7.3 05/22/2021   HGB 12.3 05/22/2021   HCT 37.2 05/22/2021   PLT 272.0 05/22/2021   GLUCOSE 95 09/14/2021   CHOL 150 09/14/2021   TRIG 95.0 09/14/2021   HDL 72.00 09/14/2021   LDLDIRECT 120.6 11/24/2012   LDLCALC 59 09/14/2021   ALT 24 09/14/2021   AST 28 09/14/2021   NA 141 09/14/2021   K 4.3 09/14/2021   CL 106 09/14/2021   CREATININE 0.93 09/14/2021   BUN 17 09/14/2021   CO2 24 09/14/2021   TSH 1.18 05/22/2021   INR 1.0 10/01/2011   HGBA1C 5.9 09/14/2021   MICROALBUR 1.2 01/05/2021    MM 3D SCREEN BREAST BILATERAL  Result Date: 01/12/2021 CLINICAL DATA:  Screening. EXAM: DIGITAL SCREENING BILATERAL MAMMOGRAM WITH TOMOSYNTHESIS AND CAD TECHNIQUE: Bilateral screening digital craniocaudal and mediolateral oblique  mammograms were obtained. Bilateral screening digital breast tomosynthesis was performed. The images were evaluated with computer-aided detection. COMPARISON:  Previous exam(s). ACR Breast Density Category c: The breast tissue is heterogeneously dense, which may obscure small masses. FINDINGS: There are no findings suspicious for malignancy. IMPRESSION: No mammographic evidence of malignancy. A result letter of this screening mammogram will be mailed directly to the patient. RECOMMENDATION: Screening mammogram in one year. (Code:SM-B-01Y) BI-RADS CATEGORY  1: Negative. Electronically Signed   By:  Margarette Canada M.D.   On: 01/12/2021 16:19    Assessment & Plan:   Sarenity was seen today for annual exam, hypertension, congestive heart failure, hyperlipidemia, hypothyroidism and diabetes.  Diagnoses and all orders for this visit:  Chronic heart failure with preserved ejection fraction ([place])- She has a normal volume status. -     Brain natriuretic peptide; Future -     Brain natriuretic peptide  Type II diabetes mellitus with manifestations ([place])- Her A1c is down to 5.9%.  Will discontinue metformin. -     Basic metabolic panel; Future -     Basic metabolic panel -     Hemoglobin A1c; Future -     Hemoglobin A1c  Stage 3b chronic kidney disease ([place])- Will discontinue metformin. -     Basic metabolic panel; Future -     Basic metabolic panel  Routine general medical examination at a health care facility- Exam completed, labs reviewed, vaccines reviewed and updated, cancer screenings are up-to-date, patient education was given. -     Hepatic function panel; Future -     Hepatic function panel  Hyperlipidemia with target LDL less than 100- Will continue the statin for cardiovascular risk reduction. -     Lipid panel; Future -     Lipid panel  OAB (overactive bladder) -     mirabegron ER (MYRBETRIQ) 50 MG TB24 tablet; Take 1 tablet (50 mg total) by mouth daily.  Mild late onset Alzheimer's  dementia without behavioral disturbance, psychotic disturbance, mood disturbance, or anxiety (HCC) -     memantine (NAMENDA) 5 MG tablet; Take 1 tablet (5 mg total) by mouth 2 (two) times daily. -     donepezil (ARICEPT ODT) 5 MG disintegrating tablet; Take 1 tablet (5 mg total) by mouth at bedtime.   I have discontinued Kaylor Harpham's metFORMIN and Belsomra. I am also having her start on memantine and donepezil. Additionally, I am having her maintain her Multiple Vitamins-Minerals (CENTRUM SILVER PO), diclofenac Sodium, triamcinolone cream, rosuvastatin, Accu-Chek Guide Control, Accu-Chek Guide Me, carvedilol, Accu-Chek Guide, potassium chloride SA, losartan, diltiazem, FLUoxetine, empagliflozin, AMBULATORY NON FORMULARY MEDICATION, AMBULATORY NON FORMULARY MEDICATION, levothyroxine, and mirabegron ER.  Meds ordered this encounter  Medications   mirabegron ER (MYRBETRIQ) 50 MG TB24 tablet    Sig: Take 1 tablet (50 mg total) by mouth daily.    Dispense:  100 tablet    Refill:  1   memantine (NAMENDA) 5 MG tablet    Sig: Take 1 tablet (5 mg total) by mouth 2 (two) times daily.    Dispense:  60 tablet    Refill:  0   donepezil (ARICEPT ODT) 5 MG disintegrating tablet    Sig: Take 1 tablet (5 mg total) by mouth at bedtime.    Dispense:  30 tablet    Refill:  0     Follow-up: Return in about 6 months (around 03/16/2022).  Scarlette Calico, MD

## 2021-09-14 NOTE — Progress Notes (Signed)
I, [name], LAT, ATC acting as a scribe for [name], MD.  Airica Schwartzkopf is a 81 y.o. female who presents to [place] today for f/u chronic bilat knee painand lumbar radiculopathy. Pt is now living [place]. Pt was last seen by Dr. Georgina Snell on 06/15/21 and was given bilat knee steroid injections and was provided a scripts for a walker. Today, pt reports both knees are very painful, esp the L. Pt c/o pain in L is radiating into the lower leg and thigh, noting pain in the L leg is an 8/10. Pt also c/o SOB, tiredness, memory problems, and zoning out, that she will be seeing Dr. Ronnald Ramp later today.   Dx testing: 09/30/20 L-spine MRI  08/08/20 R & L knee XR, and L-spine XR             01/05/21 R hand XR             01/05/21 Labs  Pertinent review of systems: No fevers or chills  Relevant historical information: Hypertension.  Sleep apnea.  Diabetes.   Exam:  BP (!) 142/84   Pulse 68   Ht '4\' 11"'$  (1.499 m)   Wt 148 lb 6.4 oz (67.3 kg)   SpO2 98%   BMI 29.97 kg/m  General: Well Developed, well nourished, and in no acute distress.   MSK: Left knee: Mild effusion normal motion with crepitation.  Mildly tender palpation medial joint line. Right knee: Minimal effusion normal motion with crepitation.  Mildly tender palpation medial joint line.    Lab and Radiology Results  Procedure: Real-time Ultrasound Guided Injection of left knee superior lateral patellar space Device: Philips Affiniti 50G Images permanently stored and available for review in PACS Verbal informed consent obtained.  Discussed risks and benefits of procedure. Warned about infection, bleeding, hyperglycemia damage to structures among others. Patient expresses understanding and agreement Time-out conducted.   Noted no overlying erythema, induration, or other signs of local infection.   Skin prepped in a sterile fashion.   Local anesthesia: Topical Ethyl chloride.   With  sterile technique and under real time ultrasound guidance: 40 mg of Kenalog and 2 mL of Marcaine injected into knee joint. Fluid seen entering the joint capsule.   Completed without difficulty   Pain immediately resolved suggesting accurate placement of the medication.   Advised to call if fevers/chills, erythema, induration, drainage, or persistent bleeding.   Images permanently stored and available for review in the ultrasound unit.  Impression: Technically successful ultrasound guided injection.    Procedure: Real-time Ultrasound Guided Injection of right knee superior lateral patellar space Device: Philips Affiniti 50G Images permanently stored and available for review in PACS Verbal informed consent obtained.  Discussed risks and benefits of procedure. Warned about infection, bleeding, hyperglycemia damage to structures among others. Patient expresses understanding and agreement Time-out conducted.   Noted no overlying erythema, induration, or other signs of local infection.   Skin prepped in a sterile fashion.   Local anesthesia: Topical Ethyl chloride.   With sterile technique and under real time ultrasound guidance: 40 mg of Kenalog and 2 mL of Marcaine injected into knee joint. Fluid seen entering the joint capsule.   Completed without difficulty   Pain immediately resolved suggesting accurate placement of the medication.   Advised to call if fevers/chills, erythema, induration, drainage, or persistent bleeding.   Images permanently stored and available for review in the ultrasound unit.  Impression: Technically successful ultrasound guided injection.  Assessment and Plan: 81 y.o. female with bilateral knee pain thought to be exacerbation of DJD.  Plan for steroid injection bilaterally.  Recheck in 3 months.   PDMP not reviewed this encounter. Orders Placed This Encounter  Procedures   [place] LIMITED JOINT SPACE STRUCTURES LOW BILAT(NO LINKED CHARGES)    Order  Specific Question:   Reason for Exam (SYMPTOM  OR DIAGNOSIS REQUIRED)    Answer:   bilateral knee pain    Order Specific Question:   Preferred imaging location?    Answer:   [place]   No orders of the defined types were placed in this encounter.    Discussed warning signs or symptoms. Please see discharge instructions. Patient expresses understanding.   The above documentation has been reviewed and is accurate and complete [name], M.D.

## 2021-09-14 NOTE — Patient Instructions (Signed)

## 2021-09-18 DIAGNOSIS — M6281 Muscle weakness (generalized): Secondary | ICD-10-CM | POA: Diagnosis not present

## 2021-09-18 DIAGNOSIS — R296 Repeated falls: Secondary | ICD-10-CM | POA: Diagnosis not present

## 2021-09-18 DIAGNOSIS — R41841 Cognitive communication deficit: Secondary | ICD-10-CM | POA: Diagnosis not present

## 2021-09-18 MED ORDER — ROSUVASTATIN CALCIUM 10 MG PO TABS: 10.0000 mg | ORAL_TABLET | Freq: Every day | ORAL | 1 refills | Status: DC

## 2021-09-20 DIAGNOSIS — M25562 Pain in left knee: Secondary | ICD-10-CM | POA: Diagnosis not present

## 2021-09-20 DIAGNOSIS — M25561 Pain in right knee: Secondary | ICD-10-CM | POA: Diagnosis not present

## 2021-09-20 DIAGNOSIS — R262 Difficulty in walking, not elsewhere classified: Secondary | ICD-10-CM | POA: Diagnosis not present

## 2021-09-20 DIAGNOSIS — R41841 Cognitive communication deficit: Secondary | ICD-10-CM | POA: Diagnosis not present

## 2021-09-21 DIAGNOSIS — R296 Repeated falls: Secondary | ICD-10-CM | POA: Diagnosis not present

## 2021-09-21 DIAGNOSIS — M6281 Muscle weakness (generalized): Secondary | ICD-10-CM | POA: Diagnosis not present

## 2021-09-21 DIAGNOSIS — R262 Difficulty in walking, not elsewhere classified: Secondary | ICD-10-CM | POA: Diagnosis not present

## 2021-09-21 DIAGNOSIS — M25562 Pain in left knee: Secondary | ICD-10-CM | POA: Diagnosis not present

## 2021-09-21 DIAGNOSIS — M25561 Pain in right knee: Secondary | ICD-10-CM | POA: Diagnosis not present

## 2021-09-22 ENCOUNTER — Telehealth: Payer: Self-pay | Admitting: Internal Medicine

## 2021-09-22 ENCOUNTER — Other Ambulatory Visit: Payer: Self-pay | Admitting: Internal Medicine

## 2021-09-22 DIAGNOSIS — F02A0 Dementia in other diseases classified elsewhere, mild, without behavioral disturbance, psychotic disturbance, mood disturbance, and anxiety: Secondary | ICD-10-CM

## 2021-09-22 MED ORDER — MEMANTINE HCL ER 14 MG PO CP24: 14.0000 mg | ORAL_CAPSULE | Freq: Every day | ORAL | 0 refills | Status: DC

## 2021-09-22 MED ORDER — DONEPEZIL HCL 10 MG PO TBDP: 10.0000 mg | ORAL_TABLET | Freq: Every day | ORAL | 0 refills | Status: DC

## 2021-09-22 NOTE — Telephone Encounter (Signed)
Aaliyah from [facility] called and stated pt is requesting refills on  donepezil (ARICEPT ODT) 5 MG disintegrating tablet  memantine (NAMENDA) 5 MG tablet  She would like them sent to  [facility], [place] Phone:  [phone]  Fax:  [phone]     Prescriptions were sent to [facility] on 09/14/21 but she wants them sent to [facility].

## 2021-09-25 DIAGNOSIS — R296 Repeated falls: Secondary | ICD-10-CM | POA: Diagnosis not present

## 2021-09-25 DIAGNOSIS — M25561 Pain in right knee: Secondary | ICD-10-CM | POA: Diagnosis not present

## 2021-09-25 DIAGNOSIS — R262 Difficulty in walking, not elsewhere classified: Secondary | ICD-10-CM | POA: Diagnosis not present

## 2021-09-25 DIAGNOSIS — M25562 Pain in left knee: Secondary | ICD-10-CM | POA: Diagnosis not present

## 2021-09-25 DIAGNOSIS — M6281 Muscle weakness (generalized): Secondary | ICD-10-CM | POA: Diagnosis not present

## 2021-09-26 DIAGNOSIS — M6281 Muscle weakness (generalized): Secondary | ICD-10-CM | POA: Diagnosis not present

## 2021-09-26 DIAGNOSIS — R41841 Cognitive communication deficit: Secondary | ICD-10-CM | POA: Diagnosis not present

## 2021-09-26 DIAGNOSIS — R296 Repeated falls: Secondary | ICD-10-CM | POA: Diagnosis not present

## 2021-09-27 DIAGNOSIS — R296 Repeated falls: Secondary | ICD-10-CM | POA: Diagnosis not present

## 2021-09-27 DIAGNOSIS — R262 Difficulty in walking, not elsewhere classified: Secondary | ICD-10-CM | POA: Diagnosis not present

## 2021-09-27 DIAGNOSIS — M6281 Muscle weakness (generalized): Secondary | ICD-10-CM | POA: Diagnosis not present

## 2021-09-27 DIAGNOSIS — M25561 Pain in right knee: Secondary | ICD-10-CM | POA: Diagnosis not present

## 2021-09-27 DIAGNOSIS — M25562 Pain in left knee: Secondary | ICD-10-CM | POA: Diagnosis not present

## 2021-09-28 DIAGNOSIS — R41841 Cognitive communication deficit: Secondary | ICD-10-CM | POA: Diagnosis not present

## 2021-10-02 DIAGNOSIS — M25561 Pain in right knee: Secondary | ICD-10-CM | POA: Diagnosis not present

## 2021-10-02 DIAGNOSIS — R41841 Cognitive communication deficit: Secondary | ICD-10-CM | POA: Diagnosis not present

## 2021-10-02 DIAGNOSIS — M25562 Pain in left knee: Secondary | ICD-10-CM | POA: Diagnosis not present

## 2021-10-02 DIAGNOSIS — M6281 Muscle weakness (generalized): Secondary | ICD-10-CM | POA: Diagnosis not present

## 2021-10-02 DIAGNOSIS — R296 Repeated falls: Secondary | ICD-10-CM | POA: Diagnosis not present

## 2021-10-02 DIAGNOSIS — R262 Difficulty in walking, not elsewhere classified: Secondary | ICD-10-CM | POA: Diagnosis not present

## 2021-10-03 DIAGNOSIS — R41841 Cognitive communication deficit: Secondary | ICD-10-CM | POA: Diagnosis not present

## 2021-10-03 DIAGNOSIS — M25562 Pain in left knee: Secondary | ICD-10-CM | POA: Diagnosis not present

## 2021-10-03 DIAGNOSIS — M25561 Pain in right knee: Secondary | ICD-10-CM | POA: Diagnosis not present

## 2021-10-03 DIAGNOSIS — M6281 Muscle weakness (generalized): Secondary | ICD-10-CM | POA: Diagnosis not present

## 2021-10-03 DIAGNOSIS — R262 Difficulty in walking, not elsewhere classified: Secondary | ICD-10-CM | POA: Diagnosis not present

## 2021-10-03 DIAGNOSIS — R296 Repeated falls: Secondary | ICD-10-CM | POA: Diagnosis not present

## 2021-10-03 NOTE — Progress Notes (Signed)
Cardiology Office Note   Date:  10/04/2021   ID:  [name], DOB [birth year], MRN [id number]  PCP:  Janith Lima, MD    No chief complaint on file.  DOE  [facility] Readings from Last 3 Encounters:  10/04/21 146 lb (66.2 kg)  09/14/21 148 lb (67.1 kg)  09/14/21 148 lb 6.4 oz (67.3 kg)       History of Present Illness: [name] is a 81 y.o. female  who has had DOE in the past.   Echo in 2015: "Left ventricle: The cavity size was normal. Systolic function was    normal. The estimated ejection fraction was in the range of 55%    to 60%. Wall motion was normal; there were no regional wall    motion abnormalities.  - Mitral valve: There was trivial regurgitation. "   She was travelling from [place] and developed some SHOB and a cough.  SHe saw Dr. Ronnald Ramp who did a w/u and sent her here for f/u.   She had pneumonia.   She gets shots in her knees from Dr. Georgina Snell.     In 2022: "Her biggest complaint is leg pain.  She describes it as starting in her knees going all the way down to the soles of her feet. Her diabetes control has been quite good for many years.  Her A1c was around 6.5.  More recently it is increased to 7."  Since the last visit, she has been doing speech therapy, occupational therapy and physical therapy regularly.  Her strength is improved.  She still has some dyspnea on exertion but overall, her energy level is better.  Denies : Chest pain. Dizziness. Leg edema. Nitroglycerin use. Orthopnea. Palpitations. Paroxysmal nocturnal dyspnea. Syncope.    Home blood pressure readings have been high as 509 systolic.  Past Medical History:  Diagnosis Date   Allergic rhinitis    Anxiety    Arthritis    Chronic cough    Dr Melvyn Novas, onset 11/2007, Sinus CT July 7,2010>neg, Resolved 10/2008 on ppi two times a day   Colon polyp    Depression    Diabetes mellitus type II    diet   Diverticulosis    Dyslipidemia    GERD (gastroesophageal reflux disease)    Hiatal  hernia    Hypertension    Hypothyroidism    Lung nodule    Obesity (BMI 30-39.9)    Pneumonia    Sleep apnea    Dr Gwenette Greet CPAP 2009    Past Surgical History:  Procedure Laterality Date   ABDOMINAL HYSTERECTOMY     APPENDECTOMY     BLADDER SUSPENSION     CHOLECYSTECTOMY     1998 in [place]  10/13   and 12/13; cataract surgery   Left breast lumpectomy     NISSEN FUNDOPLICATION     March '11 Dr Johney Maine   RECTOCELE REPAIR     TONSILLECTOMY     x 2   VENTRAL HERNIA REPAIR       Current Outpatient Medications  Medication Sig Dispense Refill   AMBULATORY NON FORMULARY MEDICATION Folding walker with front wheels.  Use as needed. Disp 1 R26.89 1 each 0   AMBULATORY NON FORMULARY MEDICATION Folding walker with front wheels. Dispense 1 Dx code: R26.89 Use as needed 1 Device 0   Blood Glucose Calibration (ACCU-CHEK GUIDE CONTROL) LIQD 1 Act by In Vitro route 2 (two) times daily as needed. 1 each 3  Blood Glucose Monitoring Suppl (ACCU-CHEK GUIDE ME) w/Device KIT 1 Act by Does not apply route 2 (two) times daily as needed. 2 kit 2   carvedilol (COREG) 6.25 MG tablet Take 1 tablet (6.25 mg total) by mouth 2 (two) times daily. 180 tablet 2   diclofenac Sodium (VOLTAREN) 1 % GEL Apply 4 g topically 4 (four) times daily. 100 g 5   diltiazem (TIADYLT ER) 180 MG 24 hr capsule Take 1 capsule (180 mg total) by mouth daily. 90 capsule 1   donepezil (ARICEPT ODT) 10 MG disintegrating tablet Take 1 tablet (10 mg total) by mouth at bedtime. 100 tablet 0   empagliflozin (JARDIANCE) 10 MG TABS tablet Take 1 tablet (10 mg total) by mouth daily before breakfast. 90 tablet 1   FLUoxetine (PROZAC) 40 MG capsule Take 1 capsule (40 mg total) by mouth daily. 90 capsule 1   glucose blood (ACCU-CHEK GUIDE) test strip 1 each by Other route 2 (two) times daily. 300 each 1   levothyroxine (SYNTHROID) 50 MCG tablet TAKE 1 TABLET EVERY DAY 90 tablet 0   losartan (COZAAR) 100 MG tablet Take 1  tablet (100 mg total) by mouth daily. 90 tablet 1   memantine (NAMENDA XR) 14 MG CP24 24 hr capsule Take 1 capsule (14 mg total) by mouth daily. 100 capsule 0   mirabegron ER (MYRBETRIQ) 50 MG TB24 tablet Take 1 tablet (50 mg total) by mouth daily. 100 tablet 1   Multiple Vitamins-Minerals (CENTRUM SILVER PO) Take by mouth daily.     potassium chloride SA (KLOR-CON M20) 20 MEQ tablet Take 1 tablet (20 mEq total) by mouth daily. 90 tablet 1   rosuvastatin (CRESTOR) 10 MG tablet Take 1 tablet (10 mg total) by mouth daily. 100 tablet 1   triamcinolone cream (KENALOG) 0.5 %      No current facility-administered medications for this visit.    Allergies:   Amoxicillin, Codeine sulfate, Hydrochlorothiazide, Iodine, Iohexol, Paroxetine, Penicillins, and Prednisone    Social History:  The patient  reports that she quit smoking about 39 years ago. Her smoking use included cigarettes. She has a 37.50 pack-year smoking history. She has never used smokeless tobacco. She reports that she does not drink alcohol and does not use drugs.   Family History:  The patient's family history includes Lung cancer in her father; Stroke (age of onset: 97) in her mother.    ROS:  Please see the history of present illness.   Otherwise, review of systems are positive for right pinky numbness- that she feels may be related to her phone usage playing solitaire. .   All other systems are reviewed and negative.    PHYSICAL EXAM: VS:  BP 140/70 (BP Location: Left Arm, Patient Position: Sitting, Cuff Size: Normal)   Pulse 64   Ht $R'4\' 11"'fg$  (1.499 m)   Wt 146 lb (66.2 kg)   SpO2 94%   BMI 29.49 kg/m  , BMI Body mass index is 29.49 kg/m. GEN: Well nourished, well developed, in no acute distress HEENT: normal Neck: no JVD, carotid bruits, or masses Cardiac: RRR; no murmurs, rubs, or gallops,no edema  Respiratory:  clear to auscultation bilaterally, normal work of breathing GI: soft, nontender, nondistended, + BS MS: no  deformity or atrophy Skin: warm and dry, no rash Neuro:  Strength and sensation are intact, using walker Psych: euthymic mood, full affect   EKG:   The ekg ordered 4/23 demonstrates NSR, PRWP   Recent Labs: 05/22/2021: Hemoglobin 12.3;  Platelets 272.0; TSH 1.18 09/14/2021: ALT 24; BUN 17; Creatinine, Ser 0.93; Potassium 4.3; Pro B Natriuretic peptide (BNP) 170.0; Sodium 141   Lipid Panel    Component Value Date/Time   CHOL 150 09/14/2021 1415   TRIG 95.0 09/14/2021 1415   HDL 72.00 09/14/2021 1415   CHOLHDL 2 09/14/2021 1415   VLDL 19.0 09/14/2021 1415   LDLCALC 59 09/14/2021 1415   LDLDIRECT 120.6 11/24/2012 1710     Other studies Reviewed: Additional studies/ records that were reviewed today with results demonstrating: labs reviewed.  LDL 59 in 6/23   ASSESSMENT AND PLAN:  DOE: Has had diastolic dysfunction, increased BNP in the past. Using walker.  Mobility is limited but improving with physical therapy.  She currently appears euvolemic. DM: A1C 5.9 in 6/23.  She does eat a healthy diet. HTN: Has some high BP reading at home.  Readings up to the 160s.  Will increase Coreg to 6.25 mg BID.  Low salt diet.  Avoid processed foods.  Knee pain: improved with PT.  She was previously on meds for dementia but these were stopped.    Current medicines are reviewed at length with the patient today.  The patient concerns regarding her medicines were addressed.  The following changes have been made: Increase carvedilol to 6.25 mg p.o. twice daily  Labs/ tests ordered today include:   Orders Placed This Encounter  Procedures   AMB Referral to [facility] Pharm-D    Recommend 150 minutes/week of aerobic exercise Low fat, low carb, high fiber diet recommended  Disposition:   FU in with PharmD in 3-4 weeks for BP check.  She should bring her home cuff  for comparison.   Signed, Larae Grooms, MD  10/04/2021 3:54 PM    [facility] [address],  [place], [place]  [address] Phone: [phone]; Fax: [phone]

## 2021-10-04 ENCOUNTER — Encounter: Payer: Self-pay | Admitting: Interventional Cardiology

## 2021-10-04 ENCOUNTER — Ambulatory Visit: Payer: Medicare HMO | Admitting: Interventional Cardiology

## 2021-10-04 VITALS — BP 140/70 | HR 64 | Ht 59.0 in | Wt 146.0 lb

## 2021-10-04 DIAGNOSIS — M79605 Pain in left leg: Secondary | ICD-10-CM

## 2021-10-04 DIAGNOSIS — M79604 Pain in right leg: Secondary | ICD-10-CM | POA: Diagnosis not present

## 2021-10-04 DIAGNOSIS — R262 Difficulty in walking, not elsewhere classified: Secondary | ICD-10-CM | POA: Diagnosis not present

## 2021-10-04 DIAGNOSIS — R0609 Other forms of dyspnea: Secondary | ICD-10-CM

## 2021-10-04 DIAGNOSIS — M6281 Muscle weakness (generalized): Secondary | ICD-10-CM | POA: Diagnosis not present

## 2021-10-04 DIAGNOSIS — M25562 Pain in left knee: Secondary | ICD-10-CM | POA: Diagnosis not present

## 2021-10-04 DIAGNOSIS — E119 Type 2 diabetes mellitus without complications: Secondary | ICD-10-CM | POA: Diagnosis not present

## 2021-10-04 DIAGNOSIS — M25561 Pain in right knee: Secondary | ICD-10-CM | POA: Diagnosis not present

## 2021-10-04 DIAGNOSIS — I10 Essential (primary) hypertension: Secondary | ICD-10-CM | POA: Diagnosis not present

## 2021-10-04 DIAGNOSIS — R296 Repeated falls: Secondary | ICD-10-CM | POA: Diagnosis not present

## 2021-10-04 MED ORDER — CARVEDILOL 6.25 MG PO TABS: 6.2500 mg | ORAL_TABLET | Freq: Two times a day (BID) | ORAL | 2 refills | Status: DC

## 2021-10-04 NOTE — Patient Instructions (Signed)
Medication Instructions:  Your physician has recommended you make the following change in your medication: Increase Carvedilol to 6.25 mg by mouth twice daily  *If you need a refill on your cardiac medications before your next appointment, please call your pharmacy*   Lab Work: none If you have labs (blood work) drawn today and your tests are completely normal, you will receive your results only by: [place] (if you have MyChart) OR A paper copy in the mail If you have any lab test that is abnormal or we need to change your treatment, we will call you to review the results.   Testing/Procedures: none   Follow-Up: At [facility], you and your health needs are our priority.  As part of our continuing mission to provide you with exceptional heart care, we have created designated Provider Care Teams.  These Care Teams include your primary Cardiologist (physician) and Advanced Practice Providers (APPs -  Physician Assistants and Nurse Practitioners) who all work together to provide you with the care you need, when you need it.  We recommend signing up for the patient portal called "MyChart".  Sign up information is provided on this After Visit Summary.  MyChart is used to connect with patients for Virtual Visits (Telemedicine).  Patients are able to view lab/test results, encounter notes, upcoming appointments, etc.  Non-urgent messages can be sent to your provider as well.   To learn more about what you can do with MyChart, go to [email or web address].    Your next appointment:   Based on hypertension clinic appointment   The format for your next appointment:   In Person  Provider:   Dr Irish Lack  If primary card or EP is not listed click here to update    :1}    Other Instructions You have been referred to see pharmacist in the hypertension clinic in our office.  Please schedule appointment for 3-4 weeks from now.  Check blood pressure at home and keep record of readings.   Bring these readings and your home blood pressure cuff to appointment with pharmacist   Important Information About Sugar

## 2021-10-05 ENCOUNTER — Telehealth: Payer: Self-pay | Admitting: Interventional Cardiology

## 2021-10-05 NOTE — Telephone Encounter (Signed)
Patient call sent straight to triage. Patient stated after she got back from her office visit yesterday she was extremely SOB (felt like she was having an asthma attack) and she had to have a wheelchair to get back to her place. Patient stated she is fine right now, because she has not gone back outside. Patient feels like it is due to the air quality. Encouraged patient to call her PCP about her SOB and see if what they are doing for patient's that are suffering from the air quality. Patient stated her PCP is out, so she wants to talk to Dr. Hassell Done nurse. Informed her that his nurse is not here today, but she is back tomorrow. Patient stated please have the nurse call her.

## 2021-10-05 NOTE — Telephone Encounter (Signed)
Pt c/o Shortness Of Breath: STAT if SOB developed within the last 24 hours or pt is noticeably SOB on the phone  1. Are you currently SOB (can you hear that pt is SOB on the phone)? No   2. How long have you been experiencing SOB? Yesterday afternoon after her appt   3. Are you SOB when sitting or when up moving around? Moving around   4. Are you currently experiencing any other symptoms? No

## 2021-10-06 NOTE — Telephone Encounter (Signed)
I spoke with patient. She has not been back outside and feels fine when she is inside.  Thinks shortness of breath was related to air quality.  She is asking where she could get a good mask.  I told her they should be available at any pharmacy.

## 2021-10-07 ENCOUNTER — Other Ambulatory Visit: Payer: Self-pay | Admitting: Internal Medicine

## 2021-10-07 DIAGNOSIS — G301 Alzheimer's disease with late onset: Secondary | ICD-10-CM

## 2021-10-09 DIAGNOSIS — R41841 Cognitive communication deficit: Secondary | ICD-10-CM | POA: Diagnosis not present

## 2021-10-09 DIAGNOSIS — M25562 Pain in left knee: Secondary | ICD-10-CM | POA: Diagnosis not present

## 2021-10-09 DIAGNOSIS — M25561 Pain in right knee: Secondary | ICD-10-CM | POA: Diagnosis not present

## 2021-10-09 DIAGNOSIS — R296 Repeated falls: Secondary | ICD-10-CM | POA: Diagnosis not present

## 2021-10-09 DIAGNOSIS — M6281 Muscle weakness (generalized): Secondary | ICD-10-CM | POA: Diagnosis not present

## 2021-10-09 DIAGNOSIS — R262 Difficulty in walking, not elsewhere classified: Secondary | ICD-10-CM | POA: Diagnosis not present

## 2021-10-10 DIAGNOSIS — R41841 Cognitive communication deficit: Secondary | ICD-10-CM | POA: Diagnosis not present

## 2021-10-10 DIAGNOSIS — R296 Repeated falls: Secondary | ICD-10-CM | POA: Diagnosis not present

## 2021-10-10 DIAGNOSIS — M6281 Muscle weakness (generalized): Secondary | ICD-10-CM | POA: Diagnosis not present

## 2021-10-11 DIAGNOSIS — M25561 Pain in right knee: Secondary | ICD-10-CM | POA: Diagnosis not present

## 2021-10-11 DIAGNOSIS — R296 Repeated falls: Secondary | ICD-10-CM | POA: Diagnosis not present

## 2021-10-11 DIAGNOSIS — R262 Difficulty in walking, not elsewhere classified: Secondary | ICD-10-CM | POA: Diagnosis not present

## 2021-10-11 DIAGNOSIS — M25562 Pain in left knee: Secondary | ICD-10-CM | POA: Diagnosis not present

## 2021-10-11 DIAGNOSIS — M6281 Muscle weakness (generalized): Secondary | ICD-10-CM | POA: Diagnosis not present

## 2021-10-16 ENCOUNTER — Ambulatory Visit: Payer: Medicare HMO | Admitting: Interventional Cardiology

## 2021-10-17 DIAGNOSIS — R41841 Cognitive communication deficit: Secondary | ICD-10-CM | POA: Diagnosis not present

## 2021-10-17 DIAGNOSIS — M6281 Muscle weakness (generalized): Secondary | ICD-10-CM | POA: Diagnosis not present

## 2021-10-17 DIAGNOSIS — R296 Repeated falls: Secondary | ICD-10-CM | POA: Diagnosis not present

## 2021-10-18 DIAGNOSIS — M6281 Muscle weakness (generalized): Secondary | ICD-10-CM | POA: Diagnosis not present

## 2021-10-18 DIAGNOSIS — R296 Repeated falls: Secondary | ICD-10-CM | POA: Diagnosis not present

## 2021-10-18 DIAGNOSIS — R41841 Cognitive communication deficit: Secondary | ICD-10-CM | POA: Diagnosis not present

## 2021-10-24 ENCOUNTER — Telehealth: Payer: Self-pay | Admitting: Internal Medicine

## 2021-10-24 NOTE — Telephone Encounter (Signed)
Caller & Relationship to patient: Psychiatrist from [facility]  Call back number: [phone]  Date of last office visit: 09/14/21  Date of next office visit: 12/12/21  Medication(s) to be refilled:  diltiazem (TIADYLT ER) 180 MG 24 hr capsule  FLUoxetine (PROZAC) 40 MG capsule  losartan (COZAAR) 100 MG tablet  potassium chloride SA (KLOR-CON M20) 20 MEQ tablet  Preferred Pharmacy:  [facility] - [place], [place] Phone:  [phone]  Fax:  [phone]

## 2021-10-25 ENCOUNTER — Other Ambulatory Visit: Payer: Self-pay | Admitting: Internal Medicine

## 2021-10-25 DIAGNOSIS — I10 Essential (primary) hypertension: Secondary | ICD-10-CM

## 2021-10-25 DIAGNOSIS — F3342 Major depressive disorder, recurrent, in full remission: Secondary | ICD-10-CM

## 2021-10-25 DIAGNOSIS — I5032 Chronic diastolic (congestive) heart failure: Secondary | ICD-10-CM

## 2021-10-25 DIAGNOSIS — E118 Type 2 diabetes mellitus with unspecified complications: Secondary | ICD-10-CM

## 2021-10-25 MED ORDER — DILTIAZEM HCL ER BEADS 180 MG PO CP24: 180.0000 mg | ORAL_CAPSULE | Freq: Every day | ORAL | 1 refills | Status: DC

## 2021-10-26 ENCOUNTER — Ambulatory Visit: Payer: Medicare HMO

## 2021-10-27 DIAGNOSIS — M6281 Muscle weakness (generalized): Secondary | ICD-10-CM | POA: Diagnosis not present

## 2021-10-27 DIAGNOSIS — R296 Repeated falls: Secondary | ICD-10-CM | POA: Diagnosis not present

## 2021-10-30 DIAGNOSIS — H04223 Epiphora due to insufficient drainage, bilateral lacrimal glands: Secondary | ICD-10-CM | POA: Diagnosis not present

## 2021-10-30 DIAGNOSIS — H04123 Dry eye syndrome of bilateral lacrimal glands: Secondary | ICD-10-CM | POA: Diagnosis not present

## 2021-10-30 DIAGNOSIS — H10433 Chronic follicular conjunctivitis, bilateral: Secondary | ICD-10-CM | POA: Diagnosis not present

## 2021-10-30 DIAGNOSIS — E113293 Type 2 diabetes mellitus with mild nonproliferative diabetic retinopathy without macular edema, bilateral: Secondary | ICD-10-CM | POA: Diagnosis not present

## 2021-11-02 DIAGNOSIS — M6281 Muscle weakness (generalized): Secondary | ICD-10-CM | POA: Diagnosis not present

## 2021-11-02 DIAGNOSIS — R296 Repeated falls: Secondary | ICD-10-CM | POA: Diagnosis not present

## 2021-11-07 ENCOUNTER — Telehealth: Payer: Self-pay | Admitting: Internal Medicine

## 2021-11-07 NOTE — Telephone Encounter (Signed)
Pt states that she has an appointment with [facility] on [address] on 12/27/21 at 10:35am and she said they advised her she needed to ask her pcp to fax over a referral to [phone].

## 2021-11-14 DIAGNOSIS — Z01419 Encounter for gynecological examination (general) (routine) without abnormal findings: Secondary | ICD-10-CM | POA: Diagnosis not present

## 2021-11-14 DIAGNOSIS — N644 Mastodynia: Secondary | ICD-10-CM | POA: Diagnosis not present

## 2021-11-14 NOTE — Progress Notes (Unsigned)
Patient ID: [name]                 DOB: 1941/02/27                      MRN: [id number]      HPI: [name] is a 81 y.o. female referred by Dr. Irish Lack to HTN clinic. PMH is significant for DOE, T2DM, HTN, hypothyroidism, obesity, sleep apnea, HLD, anxiety and depression. Pt seen most recently by Dr. Irish Lack on 7/19, BP was 140/70 and carvedilol was increased from 3.125 mg BID to 6.25 mg BID. Pt was referred for HTN management and instructed to take BP at home and bring BP cuff to visit with PharmD.   Ask about home readings Compare home cuff to manual  Depending how BP is today, consider incr coreg if HR permits vs switching to valsartan 160 mg or irbesartan 300 mg for more potent BP lowering  Ask if gets help with managing medications at home   BMET 09/14/21- Na 141, K 4.3, Scr 0.93  Current HTN meds: carvedilol 6.25 mg BID, diltiazem ER 180 mg daily, losartan 100 mg daily  Previously tried: indapamide 2.5 mg daily (pt was experiencing symptomatic hypotension and BP controlled on other medications)  BP goal: <130/80   Family History:  The patient's family history includes Lung cancer in her father; Stroke (age of onset: 40) in her mother  Social History: The patient  reports that she quit smoking about 39 years ago. Her smoking use included cigarettes. She has a 37.50 pack-year smoking history. She has never used smokeless tobacco. She reports that she does not drink alcohol and does not use drugs.  Diet:   Exercise: participates in occupational and physical therapy   Home BP readings:   Wt Readings from Last 3 Encounters:  10/04/21 146 lb (66.2 kg)  09/14/21 148 lb (67.1 kg)  09/14/21 148 lb 6.4 oz (67.3 kg)   BP Readings from Last 3 Encounters:  10/04/21 140/70  09/14/21 126/74  09/14/21 (!) 142/84   Pulse Readings from Last 3 Encounters:  10/04/21 64  09/14/21 63  09/14/21 68    Renal function: CrCl cannot be calculated (Patient's most recent lab result  is older than the maximum 21 days allowed.).  Past Medical History:  Diagnosis Date   Allergic rhinitis    Anxiety    Arthritis    Chronic cough    Dr Melvyn Novas, onset 11/2007, Sinus CT July 7,2010>neg, Resolved 10/2008 on ppi two times a day   Colon polyp    Depression    Diabetes mellitus type II    diet   Diverticulosis    Dyslipidemia    GERD (gastroesophageal reflux disease)    Hiatal hernia    Hypertension    Hypothyroidism    Lung nodule    Obesity (BMI 30-39.9)    Pneumonia    Sleep apnea    Dr Gwenette Greet CPAP 2009    Current Outpatient Medications on File Prior to Visit  Medication Sig Dispense Refill   AMBULATORY NON FORMULARY MEDICATION Folding walker with front wheels.  Use as needed. Disp 1 R26.89 1 each 0   AMBULATORY NON FORMULARY MEDICATION Folding walker with front wheels. Dispense 1 Dx code: R26.89 Use as needed 1 Device 0   Blood Glucose Calibration (ACCU-CHEK GUIDE CONTROL) LIQD 1 Act by In Vitro route 2 (two) times daily as needed. 1 each 3   Blood Glucose Monitoring Suppl (ACCU-CHEK GUIDE  ME) w/Device KIT 1 Act by Does not apply route 2 (two) times daily as needed. 2 kit 2   carvedilol (COREG) 6.25 MG tablet Take 1 tablet (6.25 mg total) by mouth 2 (two) times daily. 180 tablet 2   diclofenac Sodium (VOLTAREN) 1 % GEL Apply 4 g topically 4 (four) times daily. 100 g 5   diltiazem (TIADYLT ER) 180 MG 24 hr capsule Take 1 capsule (180 mg total) by mouth daily. 100 capsule 1   donepezil (ARICEPT ODT) 10 MG disintegrating tablet Take 1 tablet (10 mg total) by mouth at bedtime. 100 tablet 0   empagliflozin (JARDIANCE) 10 MG TABS tablet Take 1 tablet (10 mg total) by mouth daily before breakfast. 90 tablet 1   FLUoxetine (PROZAC) 40 MG capsule TAKE 1 CAPSULE EVERY DAY 90 capsule 1   glucose blood (ACCU-CHEK GUIDE) test strip 1 each by Other route 2 (two) times daily. 300 each 1   levothyroxine (SYNTHROID) 50 MCG tablet TAKE 1 TABLET EVERY DAY 90 tablet 0   losartan  (COZAAR) 100 MG tablet TAKE 1 TABLET EVERY DAY 90 tablet 1   memantine (NAMENDA XR) 14 MG CP24 24 hr capsule Take 1 capsule (14 mg total) by mouth daily. 100 capsule 0   mirabegron ER (MYRBETRIQ) 50 MG TB24 tablet Take 1 tablet (50 mg total) by mouth daily. 100 tablet 1   Multiple Vitamins-Minerals (CENTRUM SILVER PO) Take by mouth daily.     potassium chloride SA (KLOR-CON M) 20 MEQ tablet TAKE 1 TABLET EVERY DAY 90 tablet 1   PRED FORTE 1 % ophthalmic suspension SMARTSIG:In Eye(s)     rosuvastatin (CRESTOR) 10 MG tablet Take 1 tablet (10 mg total) by mouth daily. 100 tablet 1   triamcinolone cream (KENALOG) 0.5 %      No current facility-administered medications on file prior to visit.    Allergies  Allergen Reactions   Amoxicillin Other (See Comments)    GI Issues.   Codeine Sulfate    Hydrochlorothiazide    Iodine    Iohexol      Code: HIVES, Desc: PATIENT STATES SHE BREAKS OUT IN HIVES FROM IV DYE 02/27/08/RM, Onset Date: [id number]    Paroxetine    Penicillins    Prednisone     There were no vitals taken for this visit.   Assessment/Plan:  1. Hypertension -    Thank you  Eliseo Gum, PharmD PGY1 Pharmacy Resident   11/15/2021  10:26 AM

## 2021-11-15 ENCOUNTER — Other Ambulatory Visit: Payer: Self-pay | Admitting: Nurse Practitioner

## 2021-11-15 ENCOUNTER — Ambulatory Visit: Payer: Medicare HMO

## 2021-11-15 DIAGNOSIS — N644 Mastodynia: Secondary | ICD-10-CM

## 2021-11-20 ENCOUNTER — Emergency Department (HOSPITAL_BASED_OUTPATIENT_CLINIC_OR_DEPARTMENT_OTHER): Payer: Medicare HMO

## 2021-11-20 ENCOUNTER — Emergency Department (HOSPITAL_BASED_OUTPATIENT_CLINIC_OR_DEPARTMENT_OTHER)
Admission: EM | Admit: 2021-11-20 | Discharge: 2021-11-20 | Disposition: A | Discharge status: Home / Self Care | Payer: Medicare HMO | Attending: Emergency Medicine | Admitting: Emergency Medicine

## 2021-11-20 ENCOUNTER — Other Ambulatory Visit: Payer: Self-pay

## 2021-11-20 DIAGNOSIS — Z79899 Other long term (current) drug therapy: Secondary | ICD-10-CM | POA: Diagnosis not present

## 2021-11-20 DIAGNOSIS — Z7982 Long term (current) use of aspirin: Secondary | ICD-10-CM | POA: Insufficient documentation

## 2021-11-20 DIAGNOSIS — I10 Essential (primary) hypertension: Secondary | ICD-10-CM | POA: Insufficient documentation

## 2021-11-20 DIAGNOSIS — Z20822 Contact with and (suspected) exposure to covid-19: Secondary | ICD-10-CM | POA: Insufficient documentation

## 2021-11-20 DIAGNOSIS — F8081 Childhood onset fluency disorder: Secondary | ICD-10-CM

## 2021-11-20 DIAGNOSIS — R4182 Altered mental status, unspecified: Secondary | ICD-10-CM | POA: Diagnosis not present

## 2021-11-20 DIAGNOSIS — R519 Headache, unspecified: Secondary | ICD-10-CM | POA: Diagnosis not present

## 2021-11-20 LAB — BASIC METABOLIC PANEL
Anion gap: 13 (ref 5–15)
BUN: 13 mg/dL (ref 8–23)
CO2: 20 mmol/L — ABNORMAL LOW (ref 22–32)
Calcium: 9.6 mg/dL (ref 8.9–10.3)
Chloride: 105 mmol/L (ref 98–111)
Creatinine, Ser: 0.79 mg/dL (ref 0.44–1.00)
GFR, Estimated: >60 mL/min (ref >60)
Glucose, Bld: 90 mg/dL (ref 70–99)
Potassium: 3.8 mmol/L (ref 3.5–5.1)
Sodium: 138 mmol/L (ref 135–145)

## 2021-11-20 LAB — URINALYSIS, ROUTINE W REFLEX MICROSCOPIC
Bilirubin Urine: NEGATIVE
Glucose, UA: >1000 mg/dL — AB
Hgb urine dipstick: NEGATIVE
Leukocytes,Ua: NEGATIVE
Nitrite: NEGATIVE
Protein, ur: NEGATIVE mg/dL
Specific Gravity, Urine: <1.005 — ABNORMAL LOW (ref 1.005–1.030)
pH: 7.5 (ref 5.0–8.0)

## 2021-11-20 LAB — RESP PANEL BY RT-PCR (FLU A&B, COVID) ARPGX2
Influenza A by PCR: NEGATIVE
Influenza B by PCR: NEGATIVE
SARS Coronavirus 2 by RT PCR: NEGATIVE

## 2021-11-20 LAB — CBC
HCT: 36.9 % (ref 36.0–46.0)
Hemoglobin: 12.7 g/dL (ref 12.0–15.0)
MCH: 31.5 pg (ref 26.0–34.0)
MCHC: 34.4 g/dL (ref 30.0–36.0)
MCV: 91.6 fL (ref 80.0–100.0)
Platelets: 260 10*3/uL (ref 150–400)
RBC: 4.03 MIL/uL (ref 3.87–5.11)
RDW: 15.5 % (ref 11.5–15.5)
WBC: 7 10*3/uL (ref 4.0–10.5)
nRBC: 0 % (ref 0.0–0.2)

## 2021-11-20 LAB — RAPID URINE DRUG SCREEN, HOSP PERFORMED
Amphetamines: NOT DETECTED
Barbiturates: NOT DETECTED
Benzodiazepines: NOT DETECTED
Cocaine: NOT DETECTED
Opiates: NOT DETECTED
Tetrahydrocannabinol: NOT DETECTED

## 2021-11-20 LAB — ETHANOL: Alcohol, Ethyl (B): <10 mg/dL (ref <10)

## 2021-11-20 LAB — PROTIME-INR
INR: 1 (ref 0.8–1.2)
Prothrombin Time: 13.4 seconds (ref 11.4–15.2)

## 2021-11-20 LAB — APTT: aPTT: 32 seconds (ref 24–36)

## 2021-11-20 LAB — TROPONIN I (HIGH SENSITIVITY)
Troponin I (High Sensitivity): 3 ng/L (ref <18)
Troponin I (High Sensitivity): 4 ng/L (ref <18)

## 2021-11-20 MED ORDER — ASPIRIN 325 MG PO TBEC: 325.0000 mg | DELAYED_RELEASE_TABLET | Freq: Every day | ORAL | 0 refills | Status: DC

## 2021-11-20 NOTE — Discharge Instructions (Signed)
You were seen today for concern for shaking and high blood pressure.  Your work-up today is reassuring.  With your stuttering and speech difficulty, you were worked up for stroke and your work-up was reassuring.  You were offered MRI but declined.  It is important that you follow-up with neurology.  Increase aspirin to 325 mg daily.

## 2021-11-20 NOTE — ED Notes (Signed)
Patient transported to CT via w/c after being assisted via w/c to bathroom by this nurse

## 2021-11-20 NOTE — ED Provider Notes (Signed)
[facility] EMERGENCY DEPT Provider Note   CSN: [id number] Arrival date & time: 11/20/21  0103     History  Chief Complaint  Patient presents with   Hypertension    [name] is a 81 y.o. female.  HPI     This is an 81 year old female who presents with concerns for tremulousness and high blood pressure.  Patient reports that she was lying in bed at around 10 or 1030 when she "just started shaking all over."  Patient states that she got up to go to the bathroom and noted continued shaking.  She was hyperaware of this.  She also has developed some stuttering and word finding difficulty.  She noted her blood pressure to be 166/100.  No vision changes, no weakness, numbness, tingling.  Patient is very tearful and stating "what is wrong with me."  She prior to 10:00 had felt well.  No fevers.  No systemic symptoms.  Home Medications Prior to Admission medications   Medication Sig Start Date End Date Taking? Authorizing Provider  aspirin EC 325 MG tablet Take 1 tablet (325 mg total) by mouth daily. 11/20/21  Yes Tylin Force, Barbette Hair, MD  AMBULATORY NON FORMULARY MEDICATION Folding walker with front wheels.  Use as needed. Disp 1 R26.89 05/22/21   Gregor Hams, MD  AMBULATORY NON FORMULARY MEDICATION Folding walker with front wheels. Dispense 1 Dx code: R26.89 Use as needed 06/21/21   Gregor Hams, MD  Blood Glucose Calibration (ACCU-CHEK GUIDE CONTROL) LIQD 1 Act by In Vitro route 2 (two) times daily as needed. 04/14/21   Janith Lima, MD  Blood Glucose Monitoring Suppl (ACCU-CHEK GUIDE ME) w/Device KIT 1 Act by Does not apply route 2 (two) times daily as needed. 04/14/21   Janith Lima, MD  carvedilol (COREG) 6.25 MG tablet Take 1 tablet (6.25 mg total) by mouth 2 (two) times daily. 10/04/21   Jettie Booze, MD  diclofenac Sodium (VOLTAREN) 1 % GEL Apply 4 g topically 4 (four) times daily. 05/11/20   Gregor Hams, MD  diltiazem (TIADYLT ER) 180 MG 24 hr capsule  Take 1 capsule (180 mg total) by mouth daily. 10/25/21   Janith Lima, MD  donepezil (ARICEPT ODT) 10 MG disintegrating tablet Take 1 tablet (10 mg total) by mouth at bedtime. 09/22/21   Janith Lima, MD  empagliflozin (JARDIANCE) 10 MG TABS tablet Take 1 tablet (10 mg total) by mouth daily before breakfast. 05/22/21   Janith Lima, MD  FLUoxetine (PROZAC) 40 MG capsule TAKE 1 CAPSULE EVERY DAY 10/25/21   Janith Lima, MD  glucose blood (ACCU-CHEK GUIDE) test strip 1 each by Other route 2 (two) times daily. 04/14/21   Janith Lima, MD  levothyroxine (SYNTHROID) 50 MCG tablet TAKE 1 TABLET EVERY DAY 08/15/21   Janith Lima, MD  losartan (COZAAR) 100 MG tablet TAKE 1 TABLET EVERY DAY 10/25/21   Janith Lima, MD  memantine (NAMENDA XR) 14 MG CP24 24 hr capsule Take 1 capsule (14 mg total) by mouth daily. 09/22/21   Janith Lima, MD  mirabegron ER (MYRBETRIQ) 50 MG TB24 tablet Take 1 tablet (50 mg total) by mouth daily. 09/14/21   Janith Lima, MD  Multiple Vitamins-Minerals (CENTRUM SILVER PO) Take by mouth daily.    [provider]  potassium chloride SA (KLOR-CON M) 20 MEQ tablet TAKE 1 TABLET EVERY DAY 10/25/21   Janith Lima, MD  PRED FORTE 1 % ophthalmic  suspension SMARTSIG:In Eye(s) 10/30/21   [provider]  rosuvastatin (CRESTOR) 10 MG tablet Take 1 tablet (10 mg total) by mouth daily. 09/18/21   Janith Lima, MD  triamcinolone cream (KENALOG) 0.5 %  06/29/20   [provider]      Allergies    Amoxicillin, Codeine sulfate, Hydrochlorothiazide, Iodine, Iohexol, Paroxetine, Penicillins, and Prednisone    Review of Systems   Review of Systems  Constitutional:  Negative for fever.  Respiratory:  Negative for shortness of breath.   Neurological:  Positive for tremors and speech difficulty. Negative for headaches.  All other systems reviewed and are negative.   Physical Exam Updated Vital Signs BP (!) 177/83   Pulse 64   Temp 97.7 F (36.5 C)    Resp 19   Ht 1.499 m ($Remove'4\' 11"'lSFChxB$ )   Wt 66.2 kg   SpO2 96%   BMI 29.49 kg/m  Physical Exam Vitals and nursing note reviewed.  Constitutional:      Appearance: She is well-developed. She is not ill-appearing.  HENT:     Head: Normocephalic and atraumatic.  Eyes:     Pupils: Pupils are equal, round, and reactive to light.  Cardiovascular:     Rate and Rhythm: Normal rate and regular rhythm.     Heart sounds: Normal heart sounds.  Pulmonary:     Effort: Pulmonary effort is normal. No respiratory distress.     Breath sounds: No wheezing.  Abdominal:     Palpations: Abdomen is soft.     Tenderness: There is no abdominal tenderness.  Musculoskeletal:     Cervical back: Neck supple.  Skin:    General: Skin is warm and dry.  Neurological:     Mental Status: She is alert and oriented to person, place, and time.     Comments: Oriented x3, cranial nerves II through XII intact, patient can name and repeat, she frequently during history taking did appear to have difficulty word finding and would occasionally stutter, no dysmetria to finger-nose-finger, no tremor noted  Psychiatric:        Mood and Affect: Mood normal.     ED Results / Procedures / Treatments   Labs (all labs ordered are listed, but only abnormal results are displayed) Labs Reviewed  BASIC METABOLIC PANEL - Abnormal; Notable for the following components:      Result Value   CO2 20 (*)    All other components within normal limits  URINALYSIS, ROUTINE W REFLEX MICROSCOPIC - Abnormal; Notable for the following components:   Color, Urine COLORLESS (*)    Specific Gravity, Urine <1.005 (*)    Glucose, UA >1,000 (*)    Ketones, ur TRACE (*)    Bacteria, UA RARE (*)    All other components within normal limits  RESP PANEL BY RT-PCR (FLU A&B, COVID) ARPGX2  CBC  ETHANOL  PROTIME-INR  APTT  RAPID URINE DRUG SCREEN, HOSP PERFORMED  TROPONIN I (HIGH SENSITIVITY)  TROPONIN I (HIGH SENSITIVITY)    EKG EKG  Interpretation  Date/Time:  Monday November 20 2021 01:28:53 EDT Ventricular Rate:  58 PR Interval:  182 QRS Duration: 72 QT Interval:  406 QTC Calculation: 398 R Axis:   -4 Text Interpretation: Sinus bradycardia Cannot rule out Anterior infarct , age undetermined Abnormal ECG When compared with ECG of 16-Jan-2020 17:39, PREVIOUS ECG IS PRESENT Confirmed by Thayer Jew [phone]) on 11/20/2021 3:21:12 AM  Radiology CT HEAD WO CONTRAST  Result Date: 11/20/2021 CLINICAL DATA:  Mental status  change, unknown cause.  Headache. EXAM: CT HEAD WITHOUT CONTRAST TECHNIQUE: Contiguous axial images were obtained from the base of the skull through the vertex without intravenous contrast. RADIATION DOSE REDUCTION: This exam was performed according to the departmental dose-optimization program which includes automated exposure control, adjustment of the mA and/or kV according to patient size and/or use of iterative reconstruction technique. COMPARISON:  01/16/2020. FINDINGS: Brain: No acute intracranial hemorrhage, midline shift or mass effect. No extra-axial fluid collection. Diffuse atrophy is noted. Periventricular white matter hypodensities are present bilaterally. Hypodensity is present in the basal ganglia on the left, possible prominent perivascular space versus old lacunar infarct. No hydrocephalus. Vascular: No hyperdense vessel or unexpected calcification. Skull: Normal. Negative for fracture or focal lesion. Sinuses/Orbits: No acute finding. Other: None. IMPRESSION: 1. No acute intracranial process. 2. Atrophy with chronic microvascular ischemic changes. Electronically Signed   By: Brett Fairy M.D.   On: 11/20/2021 04:30   DG Chest Portable 1 View  Result Date: 11/20/2021 CLINICAL DATA:  Hypertension EXAM: PORTABLE CHEST 1 VIEW COMPARISON:  None Available. FINDINGS: The heart size and mediastinal contours are within normal limits. Both lungs are clear. The visualized skeletal structures are  unremarkable. IMPRESSION: No active disease. Electronically Signed   By: Fidela Salisbury M.D.   On: 11/20/2021 03:49    Procedures Procedures    Medications Ordered in ED Medications - No data to display  ED Course/ Medical Decision Making/ A&P Clinical Course as of 11/20/21 0533  Mon Nov 20, 2021  0528 On recheck, patient states she feels much better.  Blood pressure has down trended some.  Speech appears more fluent.  No tremulous noted.  We discussed her work-up.  Her symptoms are quite odd.  The word finding difficulty could implicate CVA or TIA; however, the shaking and stuttering would be uncharacteristic.  I have offered to send her over to [facility] for MRI.  Patient declines because she is feeling better.  We will increase her aspirin to full dose and have her follow-up with neurology.  She was given strict return precautions. [CH]    Clinical Course User Index [CH] Talvin Christianson, Barbette Hair, MD                           Medical Decision Making Amount and/or Complexity of Data Reviewed Labs: ordered. Radiology: ordered.   This patient presents to the ED for concern of high blood pressure, shakiness, this involves an extensive number of treatment options, and is a complaint that carries with it a high risk of complications and morbidity.  I considered the following differential and admission for this acute, potentially life threatening condition.  The differential diagnosis includes hypertensive urgency, hypertensive emergency, CVA, infectious etiology, fever  MDM:    This is an 81 year old female who presents with onset of shakiness at home.  She also has developed some word finding difficulty and stuttering.  She is hypertensive upon arrival but nontoxic.  Greater than 4 hours from onset of symptoms.  Her speech difficulties are subtle.  She is mostly fluent but occasionally pauses to find her words.  She can name and repeat.  She is also stuttering.  She has no significant tremor on  exam but occasionally will "shutter."  She is otherwise nonfocal.  EKG without acute ischemic changes.  Lab work obtained and largely unremarkable.  No evidence of infectious source.  Chest x-ray without pneumothorax or pneumonia.  CT head does not show  any acute abnormality.  Stroke work-up is reassuring.  On recheck, blood pressure has down trended some.  Patient states that her symptoms have essentially is resolved.  I discussed with her that the most concerning symptoms for me were her word finding difficulties although I could not explain the shaking and stuttering.  Patient declines [facility].  I recommended she increase her aspirin to a full dose aspirin daily and will provide with referral to neurology as an outpatient.  She was also given strict return precautions.  (Labs, imaging, consults)  Labs: I Ordered, and personally interpreted labs.  The pertinent results include: CBC, CMP, troponin, UDS, urinalysis  Imaging Studies ordered: I ordered imaging studies including chest x-ray, CT head I independently visualized and interpreted imaging. I agree with the radiologist interpretation  Additional history obtained from great niece at bedside.  External records from outside source obtained and reviewed including prior evaluations  Cardiac Monitoring: The patient was maintained on a cardiac monitor.  I personally viewed and interpreted the cardiac monitored which showed an underlying rhythm of: Sinus rhythm  Reevaluation: After the interventions noted above, I reevaluated the patient and found that they have :improved  Social Determinants of Health: Lives independently  Disposition: Discharge  Co morbidities that complicate the patient evaluation  Past Medical History:  Diagnosis Date   Allergic rhinitis    Anxiety    Arthritis    Chronic cough    Dr Melvyn Novas, onset 11/2007, Sinus CT July 7,2010>neg, Resolved 10/2008 on ppi two times a day   Colon polyp    Depression     Diabetes mellitus type II    diet   Diverticulosis    Dyslipidemia    GERD (gastroesophageal reflux disease)    Hiatal hernia    Hypertension    Hypothyroidism    Lung nodule    Obesity (BMI 30-39.9)    Pneumonia    Sleep apnea    Dr Gwenette Greet CPAP 2009     Medicines Meds ordered this encounter  Medications   aspirin EC 325 MG tablet    Sig: Take 1 tablet (325 mg total) by mouth daily.    Dispense:  30 tablet    Refill:  0    I have reviewed the patients home medicines and have made adjustments as needed  Problem List / ED Course: Problem List Items Addressed This Visit       Cardiovascular and Mediastinum   Essential hypertension - Primary   Relevant Medications   aspirin EC 325 MG tablet   Other Visit Diagnoses     Stuttering                       Final Clinical Impression(s) / ED Diagnoses Final diagnoses:  Essential hypertension  Stuttering    Rx / DC Orders ED Discharge Orders          Ordered    aspirin EC 325 MG tablet  Daily        11/20/21 0532    Ambulatory referral to Neurology       Comments: An appointment is requested in approximately: 1 week   11/20/21 0532              Merryl Hacker, MD 11/20/21 [phone]

## 2021-11-20 NOTE — ED Triage Notes (Signed)
POV, BIB wheelchair uses cane at home  Pt sts that around 2200 she was reading and began shaking without chills, endorses slight headache, took BP and it was 166/100. Denies SOB, vision changes.   Pt alert and oriented x 4.

## 2021-11-20 NOTE — ED Notes (Signed)
Rad Tech at bedside for [employer]

## 2021-11-22 ENCOUNTER — Encounter: Payer: Self-pay | Admitting: Neurology

## 2021-12-05 ENCOUNTER — Other Ambulatory Visit: Payer: Self-pay | Admitting: Internal Medicine

## 2021-12-05 ENCOUNTER — Telehealth: Payer: Self-pay

## 2021-12-05 DIAGNOSIS — K047 Periapical abscess without sinus: Secondary | ICD-10-CM | POA: Insufficient documentation

## 2021-12-05 MED ORDER — CLINDAMYCIN HCL 150 MG PO CAPS: 150.0000 mg | ORAL_CAPSULE | Freq: Three times a day (TID) | ORAL | 0 refills | Status: AC

## 2021-12-05 NOTE — Telephone Encounter (Signed)
Pt is requesting an abx. She is going to the dentist on Monday 12/11/21 but she has an abscess around the area that she needs the dental work.  I offered the pt an appt and she states that she lives in senior living now and its hard to get a ride.   Please advise

## 2021-12-12 ENCOUNTER — Ambulatory Visit: Payer: Medicare HMO | Admitting: Family Medicine

## 2021-12-12 ENCOUNTER — Ambulatory Visit: Payer: Medicare HMO | Admitting: Internal Medicine

## 2021-12-18 ENCOUNTER — Ambulatory Visit (INDEPENDENT_AMBULATORY_CARE_PROVIDER_SITE_OTHER): Payer: Medicare HMO

## 2021-12-18 VITALS — Ht 59.0 in | Wt 146.0 lb

## 2021-12-18 DIAGNOSIS — Z0000 Encounter for general adult medical examination without abnormal findings: Secondary | ICD-10-CM | POA: Diagnosis not present

## 2021-12-18 DIAGNOSIS — Z780 Asymptomatic menopausal state: Secondary | ICD-10-CM | POA: Diagnosis not present

## 2021-12-18 NOTE — Progress Notes (Signed)
Subjective:   [name] is a 81 y.o. female who presents for Medicare Annual (Subsequent) preventive examination.   Virtual Visit via Telephone Note  I connected with  [name] on 12/18/21 at  1:30 PM EDT by telephone and verified that I am speaking with the correct person using two identifiers.  Location: Patient: Home  Provider: [facility]  Persons participating in the virtual visit: patient/Nurse Health Advisor   I discussed the limitations, risks, security and privacy concerns of performing an evaluation and management service by telephone and the availability of in person appointments. The patient expressed understanding and agreed to proceed.  Interactive audio and video telecommunications were attempted between this nurse and patient, however failed, due to patient having technical difficulties OR patient did not have access to video capability.  We continued and completed visit with audio only.  Some vital signs may be absent or patient reported.   Lorrene Reid, LPN  Review of Systems     Cardiac Risk Factors include: advanced age (>60men, >20 women);diabetes mellitus;dyslipidemia;hypertension     Objective:    Today's Vitals   12/18/21 1342  Weight: 146 lb (66.2 kg)  Height: 4\' 11"  (1.499 m)   Body mass index is 29.49 kg/m.     12/18/2021    1:47 PM 11/20/2021    1:17 AM 10/05/2011    9:16 AM  Advanced Directives  Does Patient Have a Medical Advance Directive? Yes No   Type of 10/07/2011 of [place];Living will    Copy of Healthcare Power of Attorney in Chart? No - copy requested    Would patient like information on creating a medical advance directive?  No - Patient declined   Pre-existing out of facility DNR order (yellow form or pink MOST form)   No    Current Medications (verified) Outpatient Encounter Medications as of 12/18/2021  Medication Sig   AMBULATORY NON FORMULARY MEDICATION Folding walker with front wheels.   Use as needed. Disp 1 R26.89   AMBULATORY NON FORMULARY MEDICATION Folding walker with front wheels. Dispense 1 Dx code: R26.89 Use as needed   aspirin EC 325 MG tablet Take 1 tablet (325 mg total) by mouth daily.   Blood Glucose Calibration (ACCU-CHEK GUIDE CONTROL) LIQD 1 Act by In Vitro route 2 (two) times daily as needed.   Blood Glucose Monitoring Suppl (ACCU-CHEK GUIDE ME) w/Device KIT 1 Act by Does not apply route 2 (two) times daily as needed.   carvedilol (COREG) 6.25 MG tablet Take 1 tablet (6.25 mg total) by mouth 2 (two) times daily.   diclofenac Sodium (VOLTAREN) 1 % GEL Apply 4 g topically 4 (four) times daily.   diltiazem (TIADYLT ER) 180 MG 24 hr capsule Take 1 capsule (180 mg total) by mouth daily.   donepezil (ARICEPT ODT) 10 MG disintegrating tablet Take 1 tablet (10 mg total) by mouth at bedtime.   empagliflozin (JARDIANCE) 10 MG TABS tablet Take 1 tablet (10 mg total) by mouth daily before breakfast.   FLUoxetine (PROZAC) 40 MG capsule TAKE 1 CAPSULE EVERY DAY   glucose blood (ACCU-CHEK GUIDE) test strip 1 each by Other route 2 (two) times daily.   levothyroxine (SYNTHROID) 50 MCG tablet TAKE 1 TABLET EVERY DAY   losartan (COZAAR) 100 MG tablet TAKE 1 TABLET EVERY DAY   memantine (NAMENDA XR) 14 MG CP24 24 hr capsule Take 1 capsule (14 mg total) by mouth daily.   mirabegron ER (MYRBETRIQ) 50 MG TB24 tablet Take 1  tablet (50 mg total) by mouth daily.   Multiple Vitamins-Minerals (CENTRUM SILVER PO) Take by mouth daily.   potassium chloride SA (KLOR-CON M) 20 MEQ tablet TAKE 1 TABLET EVERY DAY   PRED FORTE 1 % ophthalmic suspension SMARTSIG:In Eye(s)   rosuvastatin (CRESTOR) 10 MG tablet Take 1 tablet (10 mg total) by mouth daily.   triamcinolone cream (KENALOG) 0.5 %    No facility-administered encounter medications on file as of 12/18/2021.    Allergies (verified) Amoxicillin, Codeine sulfate, Hydrochlorothiazide, Iodine, Iohexol, Paroxetine, Penicillins, and  Prednisone   History: Past Medical History:  Diagnosis Date   Allergic rhinitis    Anxiety    Arthritis    Chronic cough    Dr Melvyn Novas, onset 11/2007, Sinus CT July 7,2010>neg, Resolved 10/2008 on ppi two times a day   Colon polyp    Depression    Diabetes mellitus type II    diet   Diverticulosis    Dyslipidemia    GERD (gastroesophageal reflux disease)    Hiatal hernia    Hypertension    Hypothyroidism    Lung nodule    Obesity (BMI 30-39.9)    Pneumonia    Sleep apnea    Dr Gwenette Greet CPAP 2009   Past Surgical History:  Procedure Laterality Date   ABDOMINAL HYSTERECTOMY     APPENDECTOMY     BLADDER SUSPENSION     CHOLECYSTECTOMY     1998 in [place]  10/13   and 12/13; cataract surgery   Left breast lumpectomy     NISSEN FUNDOPLICATION     March '11 Dr Johney [place]   RECTOCELE REPAIR     TONSILLECTOMY     x 2   VENTRAL HERNIA REPAIR     Family History  Problem Relation Age of Onset   Stroke Mother 52   Lung cancer Father    Breast cancer Neg Hx    Social History   Socioeconomic History   Marital status: Single    Spouse name: Not on file   Number of children: Not on file   Years of education: Not on file   Highest education level: Not on file  Occupational History   Occupation: retired Systems analyst  Tobacco Use   Smoking status: Former    Packs/day: 1.50    Years: 25.00    Total pack years: 37.50    Types: Cigarettes    Quit date: 10/28/1981    Years since quitting: 40.1   Smokeless tobacco: Never  Substance and Sexual Activity   Alcohol use: No   Drug use: No   Sexual activity: Not Currently  Other Topics Concern   Not on file  Social History Narrative   HSG. Married -divorced. 2 sons. 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 Patient does not get regular exercise. Loves to read. Strong faith. Lives alone.   Social Determinants of Health   Financial Resource Strain: Low Risk  (12/18/2021)   Overall Financial Resource Strain  (CARDIA)    Difficulty of Paying Living Expenses: Not hard at all  Food Insecurity: No Food Insecurity (12/18/2021)   Hunger Vital Sign    Worried About Running Out of Food in the Last Year: Never true    Ran Out of Food in the Last Year: Never true  Transportation Needs: No Transportation Needs (12/18/2021)   PRAPARE - Hydrologist (Medical): No    Lack of Transportation (Non-Medical): No  Physical Activity: Insufficiently Active (  12/18/2021)   Exercise Vital Sign    Days of Exercise per Week: 3 days    Minutes of Exercise per Session: 30 min  Stress: No Stress Concern Present (12/18/2021)   [place]    Feeling of Stress : Not at all  Social Connections: Moderately Integrated (12/18/2021)   Social Connection and Isolation Panel [NHANES]    Frequency of Communication with Friends and Family: More than three times a week    Frequency of Social Gatherings with Friends and Family: More than three times a week    Attends Religious Services: More than 4 times per year    Active Member of [employer] or Organizations: Yes    Attends Music therapist: More than 4 times per year    Marital Status: Divorced    Tobacco Counseling Counseling given: Not Answered   Clinical Intake:  Pre-visit preparation completed: Yes  Pain : No/denies pain     Nutritional Risks: None Diabetes: No  How often do you need to have someone help you when you read instructions, pamphlets, or other written materials from your doctor or pharmacy?: 1 - Never  Diabetic?yes  Nutrition Risk Assessment:  Has the patient had any N/V/D within the last 2 months?  No  Does the patient have any non-healing wounds?  No  Has the patient had any unintentional weight loss or weight gain?  No   Diabetes:  Is the patient diabetic?  Yes  If diabetic, was a CBG obtained today?  No  Did the patient bring in their  glucometer from home?  No  How often do you monitor your CBG's? 2 x day   Financial Strains and Diabetes Management:  Are you having any financial strains with the device, your supplies or your medication? No .  Does the patient want to be seen by Chronic Care Management for management of their diabetes?  No  Would the patient like to be referred to a Nutritionist or for Diabetic Management?  No   Diabetic Exams:  Diabetic Eye Exam: Completed 11/2021 Diabetic Foot Exam: Overdue, Pt has been advised about the importance in completing this exam. Pt is scheduled for diabetic foot exam on next office visit .   Interpreter Needed?: No  Information entered by :: Jadene Pierini, LPN   Activities of Daily Living    12/18/2021    1:46 PM  In your present state of health, do you have any difficulty performing the following activities:  Hearing? 0  Vision? 0  Difficulty concentrating or making decisions? 0  Walking or climbing stairs? 0  Dressing or bathing? 0  Doing errands, shopping? 0  Preparing Food and eating ? N  Using the Toilet? N  In the past six months, have you accidently leaked urine? N  Do you have problems with loss of bowel control? N  Managing your Medications? N  Managing your Finances? N  Housekeeping or managing your Housekeeping? N    Patient Care Team: Janith Lima, MD as PCP - General (Internal Medicine) Jettie Booze, MD as PCP - Cardiology (Cardiology)  Indicate any recent Medical Services you may have received from other than [facility] providers in the past year (date may be approximate).     Assessment:   This is a routine wellness examination for [name].  Hearing/Vision screen Vision Screening - Comments:: Annual eye exams wear glasses   Dietary issues and exercise activities discussed: Current Exercise Habits: Home  exercise routine, Time (Minutes): 30, Frequency (Times/Week): 3, Weekly Exercise (Minutes/Week): 90, Intensity: Mild, Exercise  limited by: None identified   Goals Addressed             This Visit's Progress    DIET - INCREASE WATER INTAKE         Depression Screen    12/18/2021    1:44 PM 05/22/2021    1:05 PM 09/08/2020    3:16 PM 12/28/2014    4:39 PM 01/22/2012    4:26 PM  PHQ 2/9 Scores  PHQ - 2 Score 0 0 0 0 2  PHQ- 9 Score  0 0      Fall Risk    12/18/2021    1:43 PM 09/14/2021    1:35 PM 09/08/2020    3:16 PM 01/19/2020    1:46 PM 12/28/2014    4:39 PM  [place] in the past year? 0 0 1 0 No  Number falls in past yr: 0  0    Injury with Fall? 0  1    Risk for fall due to : No Fall Risks      Follow up Falls prevention discussed        [place]:  Any stairs in or around the home? No  If so, are there any without handrails? No  Home free of loose throw rugs in walkways, pet beds, electrical cords, etc? Yes  Adequate lighting in your home to reduce risk of falls? Yes   ASSISTIVE DEVICES UTILIZED TO PREVENT FALLS:  Life alert? Yes  Use of a cane, walker or w/c? Yes  Grab bars in the bathroom? Yes  Shower chair or bench in shower? Yes  Elevated toilet seat or a handicapped toilet? Yes       11/29/2014    1:20 PM  MMSE - Mini Mental State Exam  Orientation to time 5  Orientation to Place 5  Registration 3  Attention/ Calculation 5  Recall 3  Language- name 2 objects 2  Language- repeat 1  Language- follow 3 step command 3  Language- read & follow direction 1  Write a sentence 1  Copy design 1  Total score 30        12/18/2021    1:46 PM  6CIT Screen  What Year? 0 points  What month? 0 points  What time? 0 points  Count back from 20 0 points  Months in reverse 0 points  Repeat phrase 2 points  Total Score 2 points    Immunizations Immunization History  Administered Date(s) Administered   Fluad Quad(high Dose 65+) 01/19/2020   Influenza Split 12/15/2010, 01/24/2012   Influenza Whole 01/07/2006, 01/15/2007, 12/18/2007,  12/16/2008, 01/24/2010   Influenza, High Dose Seasonal PF 12/04/2013   Influenza,inj,Quad PF,6+ Mos 12/08/2012   Influenza-Unspecified 01/08/2016, 01/17/2021   Moderna Sars-Covid-2 Vaccination 03/30/2019, 04/27/2019, 01/14/2020   Pneumococcal Conjugate-13 12/04/2013   Pneumococcal Polysaccharide-23 12/16/2008, 07/24/2021   Td 04/25/2009   Zoster Recombinat (Shingrix) 08/26/2021   Zoster, Live 12/04/2013    TDAP status: Due, Education has been provided regarding the importance of this vaccine. Advised may receive this vaccine at local pharmacy or Health Dept. Aware to provide a copy of the vaccination record if obtained from local pharmacy or Health Dept. Verbalized acceptance and understanding.  Flu Vaccine status: Due, Education has been provided regarding the importance of this vaccine. Advised may receive this vaccine at local pharmacy or Health Dept.  Aware to provide a copy of the vaccination record if obtained from local pharmacy or Health Dept. Verbalized acceptance and understanding.  Pneumococcal vaccine status: Up to date  Covid-19 vaccine status: Completed vaccines  Qualifies for Shingles Vaccine? Yes   Zostavax completed No   Shingrix Completed?: No.    Education has been provided regarding the importance of this vaccine. Patient has been advised to call insurance company to determine out of pocket expense if they have not yet received this vaccine. Advised may also receive vaccine at local pharmacy or Health Dept. Verbalized acceptance and understanding.  Screening Tests Health Maintenance  Topic Date Due   OPHTHALMOLOGY EXAM  04/29/2014   TETANUS/TDAP  04/26/2019   COVID-19 Vaccine (4 - Moderna series) 03/10/2020   INFLUENZA VACCINE  10/17/2021   Zoster Vaccines- Shingrix (2 of 2) 10/21/2021   Diabetic kidney evaluation - Urine ACR  01/05/2022   FOOT EXAM  07/25/2022   Diabetic kidney evaluation - GFR measurement  11/21/2022   Pneumonia Vaccine 88+ Years old  Completed    DEXA SCAN  Completed   HPV VACCINES  Aged Out    Health Maintenance  Health Maintenance Due  Topic Date Due   OPHTHALMOLOGY EXAM  04/29/2014   TETANUS/TDAP  04/26/2019   COVID-19 Vaccine (4 - Moderna series) 03/10/2020   INFLUENZA VACCINE  10/17/2021   Zoster Vaccines- Shingrix (2 of 2) 10/21/2021   Diabetic kidney evaluation - Urine ACR  01/05/2022    Colorectal cancer screening: No longer required.   Mammogram status: No longer required due to age.  Bone Density status: Ordered 12/18/2021. Pt provided with contact info and advised to call to schedule appt.  Lung Cancer Screening: (Low Dose CT Chest recommended if Age 63-80 years, 30 pack-year currently smoking OR have quit w/in 15years.) does not qualify.   Lung Cancer Screening Referral: n/a  Additional Screening:  Hepatitis C Screening: does not qualify;   Vision Screening: Recommended annual ophthalmology exams for early detection of glaucoma and other disorders of the eye. Is the patient up to date with their annual eye exam?  Yes  Who is the provider or what is the name of the office in which the patient attends annual eye exams? Dr.Hecker  If pt is not established with a provider, would they like to be referred to a provider to establish care? No .   Dental Screening: Recommended annual dental exams for proper oral hygiene  Community Resource Referral / Chronic Care Management: CRR required this visit?  No   CCM required this visit?  No      Plan:     I have personally reviewed and noted the following in the patient's chart:   Medical and social history Use of alcohol, tobacco or illicit drugs  Current medications and supplements including opioid prescriptions. Patient is not currently taking opioid prescriptions. Functional ability and status Nutritional status Physical activity Advanced directives List of other physicians Hospitalizations, surgeries, and ER visits in previous 12  months Vitals Screenings to include cognitive, depression, and falls Referrals and appointments  In addition, I have reviewed and discussed with patient certain preventive protocols, quality metrics, and best practice recommendations. A written personalized care plan for preventive services as well as general preventive health recommendations were provided to patient.     Daphane Shepherd, LPN   [id number]/03/[id number]   Nurse Notes: Due TDAP vaccine

## 2021-12-18 NOTE — Patient Instructions (Signed)
[name] , Thank you for taking time to come for your Medicare Wellness Visit. I appreciate your ongoing commitment to your health goals. Please review the following plan we discussed and let me know if I can assist you in the future.   These are the goals we discussed:  Goals      DIET - INCREASE WATER INTAKE        This is a list of the screening recommended for you and due dates:  Health Maintenance  Topic Date Due   Eye exam for diabetics  04/29/2014   Tetanus Vaccine  04/26/2019   COVID-19 Vaccine (4 - Moderna series) 03/10/2020   Flu Shot  10/17/2021   Zoster (Shingles) Vaccine (2 of 2) 10/21/2021   Yearly kidney health urinalysis for diabetes  01/05/2022   Complete foot exam   07/25/2022   Yearly kidney function blood test for diabetes  11/21/2022   Pneumonia Vaccine  Completed   DEXA scan (bone density measurement)  Completed   HPV Vaccine  Aged Out    Advanced directives: Please bring a copy of your health care power of attorney and living will to the office to be added to your chart at your convenience.   Conditions/risks identified: Aim for 30 minutes of exercise or brisk walking, 6-8 glasses of water, and 5 servings of fruits and vegetables each day.   Next appointment: Follow up in one year for your annual wellness visit    Preventive Care 65 Years and Older, Female Preventive care refers to lifestyle choices and visits with your health care provider that can promote health and wellness. What does preventive care include? A yearly physical exam. This is also called an annual well check. Dental exams once or twice a year. Routine eye exams. Ask your health care provider how often you should have your eyes checked. Personal lifestyle choices, including: Daily care of your teeth and gums. Regular physical activity. Eating a healthy diet. Avoiding tobacco and drug use. Limiting alcohol use. Practicing safe sex. Taking low-dose aspirin every day. Taking  vitamin and mineral supplements as recommended by your health care provider. What happens during an annual well check? The services and screenings done by your health care provider during your annual well check will depend on your age, overall health, lifestyle risk factors, and family history of disease. Counseling  Your health care provider may ask you questions about your: Alcohol use. Tobacco use. Drug use. Emotional well-being. Home and relationship well-being. Sexual activity. Eating habits. History of falls. Memory and ability to understand (cognition). Work and work Statistician. Reproductive health. Screening  You may have the following tests or measurements: Height, weight, and BMI. Blood pressure. Lipid and cholesterol levels. These may be checked every 5 years, or more frequently if you are over 66 years old. Skin check. Lung cancer screening. You may have this screening every year starting at age 40 if you have a 30-pack-year history of smoking and currently smoke or have quit within the past 15 years. Fecal occult blood test (FOBT) of the stool. You may have this test every year starting at age 46. Flexible sigmoidoscopy or colonoscopy. You may have a sigmoidoscopy every 5 years or a colonoscopy every 10 years starting at age 67. Hepatitis C blood test. Hepatitis B blood test. Sexually transmitted disease (STD) testing. Diabetes screening. This is done by checking your blood sugar (glucose) after you have not eaten for a while (fasting). You may have this done every 1-3 years.  Bone density scan. This is done to screen for osteoporosis. You may have this done starting at age 37. Mammogram. This may be done every 1-2 years. Talk to your health care provider about how often you should have regular mammograms. Talk with your health care provider about your test results, treatment options, and if necessary, the need for more tests. Vaccines  Your health care provider may  recommend certain vaccines, such as: Influenza vaccine. This is recommended every year. Tetanus, diphtheria, and acellular pertussis (Tdap, Td) vaccine. You may need a Td booster every 10 years. Zoster vaccine. You may need this after age 2. Pneumococcal 13-valent conjugate (PCV13) vaccine. One dose is recommended after age 39. Pneumococcal polysaccharide (PPSV23) vaccine. One dose is recommended after age 70. Talk to your health care provider about which screenings and vaccines you need and how often you need them. This information is not intended to replace advice given to you by your health care provider. Make sure you discuss any questions you have with your health care provider. Document Released: 04/01/2015 Document Revised: 11/23/2015 Document Reviewed: 01/04/2015 Elsevier Interactive Patient Education  2017 [place] Prevention in the Home Falls can cause injuries. They can happen to people of all ages. There are many things you can do to make your home safe and to help prevent falls. What can I do on the outside of my home? Regularly fix the edges of walkways and driveways and fix any cracks. Remove anything that might make you trip as you walk through a door, such as a raised step or threshold. Trim any bushes or trees on the path to your home. Use bright outdoor lighting. Clear any walking paths of anything that might make someone trip, such as rocks or tools. Regularly check to see if handrails are loose or broken. Make sure that both sides of any steps have handrails. Any raised decks and porches should have guardrails on the edges. Have any leaves, snow, or ice cleared regularly. Use sand or salt on walking paths during winter. Clean up any spills in your garage right away. This includes oil or grease spills. What can I do in the bathroom? Use night lights. Install grab bars by the toilet and in the tub and shower. Do not use towel bars as grab bars. Use non-skid  mats or decals in the tub or shower. If you need to sit down in the shower, use a plastic, non-slip stool. Keep the floor dry. Clean up any water that spills on the floor as soon as it happens. Remove soap buildup in the tub or shower regularly. Attach bath mats securely with double-sided non-slip rug tape. Do not have throw rugs and other things on the floor that can make you trip. What can I do in the bedroom? Use night lights. Make sure that you have a light by your bed that is easy to reach. Do not use any sheets or blankets that are too big for your bed. They should not hang down onto the floor. Have a firm chair that has side arms. You can use this for support while you get dressed. Do not have throw rugs and other things on the floor that can make you trip. What can I do in the kitchen? Clean up any spills right away. Avoid walking on wet floors. Keep items that you use a lot in easy-to-reach places. If you need to reach something above you, use a strong step stool that has a grab bar.  Keep electrical cords out of the way. Do not use floor polish or wax that makes floors slippery. If you must use wax, use non-skid floor wax. Do not have throw rugs and other things on the floor that can make you trip. What can I do with my stairs? Do not leave any items on the stairs. Make sure that there are handrails on both sides of the stairs and use them. Fix handrails that are broken or loose. Make sure that handrails are as long as the stairways. Check any carpeting to make sure that it is firmly attached to the stairs. Fix any carpet that is loose or worn. Avoid having throw rugs at the top or bottom of the stairs. If you do have throw rugs, attach them to the floor with carpet tape. Make sure that you have a light switch at the top of the stairs and the bottom of the stairs. If you do not have them, ask someone to add them for you. What else can I do to help prevent falls? Wear shoes  that: Do not have high heels. Have rubber bottoms. Are comfortable and fit you well. Are closed at the toe. Do not wear sandals. If you use a stepladder: Make sure that it is fully opened. Do not climb a closed stepladder. Make sure that both sides of the stepladder are locked into place. Ask someone to hold it for you, if possible. Clearly mark and make sure that you can see: Any grab bars or handrails. First and last steps. Where the edge of each step is. Use tools that help you move around (mobility aids) if they are needed. These include: Canes. Walkers. Scooters. Crutches. Turn on the lights when you go into a dark area. Replace any light bulbs as soon as they burn out. Set up your furniture so you have a clear path. Avoid moving your furniture around. If any of your floors are uneven, fix them. If there are any pets around you, be aware of where they are. Review your medicines with your doctor. Some medicines can make you feel dizzy. This can increase your chance of falling. Ask your doctor what other things that you can do to help prevent falls. This information is not intended to replace advice given to you by your health care provider. Make sure you discuss any questions you have with your health care provider. Document Released: 12/30/2008 Document Revised: 08/11/2015 Document Reviewed: 04/09/2014 Elsevier Interactive Patient Education  2017 [employer].

## 2021-12-25 ENCOUNTER — Ambulatory Visit: Payer: Medicare HMO | Admitting: Family Medicine

## 2021-12-25 ENCOUNTER — Ambulatory Visit: Payer: Self-pay

## 2021-12-25 VITALS — BP 128/74 | HR 72 | Ht 59.0 in | Wt 146.0 lb

## 2021-12-25 DIAGNOSIS — H04123 Dry eye syndrome of bilateral lacrimal glands: Secondary | ICD-10-CM | POA: Diagnosis not present

## 2021-12-25 DIAGNOSIS — M25561 Pain in right knee: Secondary | ICD-10-CM | POA: Diagnosis not present

## 2021-12-25 DIAGNOSIS — E113291 Type 2 diabetes mellitus with mild nonproliferative diabetic retinopathy without macular edema, right eye: Secondary | ICD-10-CM | POA: Diagnosis not present

## 2021-12-25 DIAGNOSIS — G8929 Other chronic pain: Secondary | ICD-10-CM

## 2021-12-25 DIAGNOSIS — M25562 Pain in left knee: Secondary | ICD-10-CM | POA: Diagnosis not present

## 2021-12-25 DIAGNOSIS — H353132 Nonexudative age-related macular degeneration, bilateral, intermediate dry stage: Secondary | ICD-10-CM | POA: Diagnosis not present

## 2021-12-25 DIAGNOSIS — H40013 Open angle with borderline findings, low risk, bilateral: Secondary | ICD-10-CM | POA: Diagnosis not present

## 2021-12-25 DIAGNOSIS — H524 Presbyopia: Secondary | ICD-10-CM | POA: Diagnosis not present

## 2021-12-25 NOTE — Patient Instructions (Signed)
Thank you for coming in today.   You received an injection today. Seek immediate medical attention if the joint becomes red, extremely painful, or is oozing fluid.  

## 2021-12-25 NOTE — Progress Notes (Signed)
I, [name], LAT, ATC acting as a scribe for [name], MD.  [name] is a 81 y.o. female who presents to [place] at [facility] today for cont'd bilat knee pain. Pt was last seen by Dr. Georgina Snell on 09/14/21 and was given bilat knee steroid injections. Today, pt reports bilat knee pain returned over the last week or 2. Pt has been using the Voltaren gel and the compression sleeve, but it's to a point where it's not helping. Pt notes that the pain is radiating distally into the the L ankle and slightly proximally into the L thigh.   Dx testing: 09/30/20 L-spine MRI  08/08/20 R & L knee XR, and L-spine XR             01/05/21 R hand XR             01/05/21 Labs  Pertinent review of systems: No fevers or chills  Relevant historical information: Heart failure.  Alzheimer's   Exam:  BP 128/74   Pulse 72   Ht '4\' 11"'$  (1.499 m)   Wt 146 lb (66.2 kg)   SpO2 98%   BMI 29.49 kg/m  General: Well Developed, well nourished, and in no acute distress.   MSK: Bilateral knees mild effusion normal motion with crepitation.    Lab and Radiology Results  Procedure: Real-time Ultrasound Guided Injection of left knee superior lateral patellar space Device: Philips Affiniti 50G Images permanently stored and available for review in PACS Verbal informed consent obtained.  Discussed risks and benefits of procedure. Warned about infection, bleeding, hyperglycemia damage to structures among others. Patient expresses understanding and agreement Time-out conducted.   Noted no overlying erythema, induration, or other signs of local infection.   Skin prepped in a sterile fashion.   Local anesthesia: Topical Ethyl chloride.   With sterile technique and under real time ultrasound guidance: 40 mg of Kenalog and 2 mL of Marcaine injected into knee joint. Fluid seen entering the joint capsule.   Completed without difficulty   Pain immediately resolved suggesting accurate placement  of the medication.   Advised to call if fevers/chills, erythema, induration, drainage, or persistent bleeding.   Images permanently stored and available for review in the ultrasound unit.  Impression: Technically successful ultrasound guided injection.    Procedure: Real-time Ultrasound Guided Injection of right knee superior lateral patellar space Device: Philips Affiniti 50G Images permanently stored and available for review in PACS Verbal informed consent obtained.  Discussed risks and benefits of procedure. Warned about infection, bleeding, hyperglycemia damage to structures among others. Patient expresses understanding and agreement Time-out conducted.   Noted no overlying erythema, induration, or other signs of local infection.   Skin prepped in a sterile fashion.   Local anesthesia: Topical Ethyl chloride.   With sterile technique and under real time ultrasound guidance: 40 mg of Kenalog and 2 mL of Marcaine injected into knee joint. Fluid seen entering the joint capsule.   Completed without difficulty   Pain immediately resolved suggesting accurate placement of the medication.   Advised to call if fevers/chills, erythema, induration, drainage, or persistent bleeding.   Images permanently stored and available for review in the ultrasound unit.  Impression: Technically successful ultrasound guided injection.         Assessment and Plan: 81 y.o. female with bilateral knee pain due to DJD.  Plan for repeat steroid injection.  Last injection was over 3 months ago.  She will schedule for about 3 months  from now for the next visit where we can anticipate that we will consider steroid injections.  She will let me know ahead of time in 2024 or we can get Zilretta authorized as well.   PDMP not reviewed this encounter. Orders Placed This Encounter  Procedures   [place] LIMITED JOINT SPACE STRUCTURES LOW BILAT(NO LINKED CHARGES)    Order Specific Question:   Reason for Exam (SYMPTOM  OR  DIAGNOSIS REQUIRED)    Answer:   Bilateral knee pain    Order Specific Question:   Preferred imaging location?    Answer:   [place]   No orders of the defined types were placed in this encounter.    Discussed warning signs or symptoms. Please see discharge instructions. Patient expresses understanding.   The above documentation has been reviewed and is accurate and complete [name], M.D.

## 2021-12-27 ENCOUNTER — Other Ambulatory Visit: Payer: Self-pay | Admitting: Internal Medicine

## 2021-12-27 ENCOUNTER — Other Ambulatory Visit: Payer: Medicare HMO

## 2021-12-27 DIAGNOSIS — G301 Alzheimer's disease with late onset: Secondary | ICD-10-CM

## 2022-01-02 ENCOUNTER — Ambulatory Visit: Payer: Medicare HMO | Admitting: Neurology

## 2022-01-03 LAB — HM DIABETES EYE EXAM

## 2022-01-16 DIAGNOSIS — L292 Pruritus vulvae: Secondary | ICD-10-CM | POA: Diagnosis not present

## 2022-01-16 DIAGNOSIS — N898 Other specified noninflammatory disorders of vagina: Secondary | ICD-10-CM | POA: Diagnosis not present

## 2022-01-22 ENCOUNTER — Other Ambulatory Visit: Payer: Self-pay | Admitting: Internal Medicine

## 2022-01-22 DIAGNOSIS — E785 Hyperlipidemia, unspecified: Secondary | ICD-10-CM

## 2022-01-22 DIAGNOSIS — E039 Hypothyroidism, unspecified: Secondary | ICD-10-CM

## 2022-01-25 ENCOUNTER — Telehealth: Payer: Self-pay | Admitting: Internal Medicine

## 2022-01-25 DIAGNOSIS — I5032 Chronic diastolic (congestive) heart failure: Secondary | ICD-10-CM

## 2022-01-25 MED ORDER — EMPAGLIFLOZIN 10 MG PO TABS: 10.0000 mg | ORAL_TABLET | Freq: Every day | ORAL | 1 refills | Status: DC

## 2022-01-25 NOTE — Telephone Encounter (Signed)
Patient needs her jardiance 10 mg refilled - Please send to [place] - Next visit 03/27/2022

## 2022-01-29 ENCOUNTER — Telehealth: Payer: Self-pay | Admitting: Internal Medicine

## 2022-01-29 NOTE — Telephone Encounter (Signed)
Pt called to inform provider of night sweats causing her to change her shirt 3 times per night. Pt says she does not have any hot flashes or other symptoms. Age 81 and never had any problems with menopause and had a hysterectomy age 1, so pt believes it may be her thyroid. Pt says she takes levothyroxine. Please advise.  Pt phone [phone]

## 2022-01-30 NOTE — Telephone Encounter (Signed)
Patient scheduled.

## 2022-01-30 NOTE — Telephone Encounter (Signed)
Called pt, LVM to discuss and schedule a OV.

## 2022-02-01 ENCOUNTER — Ambulatory Visit: Payer: Medicare HMO

## 2022-02-01 ENCOUNTER — Ambulatory Visit
Admission: RE | Admit: 2022-02-01 | Discharge: 2022-02-01 | Disposition: A | Discharge status: Home / Self Care | Payer: Medicare HMO | Source: Ambulatory Visit | Attending: Nurse Practitioner | Admitting: Nurse Practitioner

## 2022-02-01 DIAGNOSIS — N644 Mastodynia: Secondary | ICD-10-CM

## 2022-02-01 DIAGNOSIS — R92333 Mammographic heterogeneous density, bilateral breasts: Secondary | ICD-10-CM | POA: Diagnosis not present

## 2022-02-06 ENCOUNTER — Ambulatory Visit: Payer: Medicare HMO | Admitting: Neurology

## 2022-02-12 ENCOUNTER — Telehealth: Payer: Self-pay | Admitting: Internal Medicine

## 2022-02-12 NOTE — Telephone Encounter (Signed)
Pt says she spoke to PCP on 02/08/22 and he tod her to come this Thursday after 9:30 am. Please advise pt [phone]

## 2022-02-13 ENCOUNTER — Ambulatory Visit: Payer: Medicare HMO | Admitting: Internal Medicine

## 2022-02-13 NOTE — Telephone Encounter (Signed)
She has been scheduled.

## 2022-02-15 ENCOUNTER — Ambulatory Visit (INDEPENDENT_AMBULATORY_CARE_PROVIDER_SITE_OTHER): Payer: Medicare HMO | Admitting: Internal Medicine

## 2022-02-15 ENCOUNTER — Encounter: Payer: Self-pay | Admitting: Internal Medicine

## 2022-02-15 VITALS — BP 144/60 | HR 65 | Temp 97.9°F | Ht 59.0 in | Wt 141.0 lb

## 2022-02-15 DIAGNOSIS — I10 Essential (primary) hypertension: Secondary | ICD-10-CM

## 2022-02-15 DIAGNOSIS — N1832 Chronic kidney disease, stage 3b: Secondary | ICD-10-CM

## 2022-02-15 DIAGNOSIS — E039 Hypothyroidism, unspecified: Secondary | ICD-10-CM | POA: Diagnosis not present

## 2022-02-15 DIAGNOSIS — G301 Alzheimer's disease with late onset: Secondary | ICD-10-CM | POA: Diagnosis not present

## 2022-02-15 DIAGNOSIS — F02A0 Dementia in other diseases classified elsewhere, mild, without behavioral disturbance, psychotic disturbance, mood disturbance, and anxiety: Secondary | ICD-10-CM

## 2022-02-15 DIAGNOSIS — E118 Type 2 diabetes mellitus with unspecified complications: Secondary | ICD-10-CM

## 2022-02-15 LAB — CBC WITH DIFFERENTIAL/PLATELET
Basophils Absolute: 0.1 10*3/uL (ref 0.0–0.1)
Basophils Relative: 0.9 % (ref 0.0–3.0)
Eosinophils Absolute: 0.1 10*3/uL (ref 0.0–0.7)
Eosinophils Relative: 0.8 % (ref 0.0–5.0)
HCT: 39.8 % (ref 36.0–46.0)
Hemoglobin: 13.3 g/dL (ref 12.0–15.0)
Lymphocytes Relative: 24.2 % (ref 12.0–46.0)
Lymphs Abs: 1.7 10*3/uL (ref 0.7–4.0)
MCHC: 33.5 g/dL (ref 30.0–36.0)
MCV: 94.9 fl (ref 78.0–100.0)
Monocytes Absolute: 0.7 10*3/uL (ref 0.1–1.0)
Monocytes Relative: 10.8 % (ref 3.0–12.0)
Neutro Abs: 4.3 10*3/uL (ref 1.4–7.7)
Neutrophils Relative %: 63.3 % (ref 43.0–77.0)
Platelets: 230 10*3/uL (ref 150.0–400.0)
RBC: 4.2 Mil/uL (ref 3.87–5.11)
RDW: 14.7 % (ref 11.5–15.5)
WBC: 6.8 10*3/uL (ref 4.0–10.5)

## 2022-02-15 LAB — URINALYSIS, ROUTINE W REFLEX MICROSCOPIC
Hgb urine dipstick: NEGATIVE
Ketones, ur: NEGATIVE
Leukocytes,Ua: NEGATIVE
Nitrite: NEGATIVE
Specific Gravity, Urine: >=1.03 — AB (ref 1.000–1.030)
Urine Glucose: 250 — AB
Urobilinogen, UA: 1 (ref 0.0–1.0)
pH: 6 (ref 5.0–8.0)

## 2022-02-15 LAB — HEMOGLOBIN A1C: Hgb A1c MFr Bld: 6.1 % (ref 4.6–6.5)

## 2022-02-15 LAB — BASIC METABOLIC PANEL
BUN: 14 mg/dL (ref 6–23)
CO2: 26 mEq/L (ref 19–32)
Calcium: 9.6 mg/dL (ref 8.4–10.5)
Chloride: 106 mEq/L (ref 96–112)
Creatinine, Ser: 0.93 mg/dL (ref 0.40–1.20)
GFR: 57.16 mL/min — ABNORMAL LOW (ref >60.00)
Glucose, Bld: 122 mg/dL — ABNORMAL HIGH (ref 70–99)
Potassium: 4 mEq/L (ref 3.5–5.1)
Sodium: 139 mEq/L (ref 135–145)

## 2022-02-15 LAB — MICROALBUMIN / CREATININE URINE RATIO
Creatinine,U: 268.2 mg/dL
Microalb Creat Ratio: 0.7 mg/g (ref 0.0–30.0)
Microalb, Ur: 1.7 mg/dL (ref 0.0–1.9)

## 2022-02-15 LAB — TSH: TSH: 1.64 u[IU]/mL (ref 0.35–5.50)

## 2022-02-15 NOTE — Patient Instructions (Signed)

## 2022-02-15 NOTE — Progress Notes (Signed)
Subjective:  Patient ID: [name], female    DOB: 1940-10-24  Age: 81 y.o. MRN: [id number]  CC: Hypertension, Hypothyroidism, Congestive Heart Failure, and Diabetes   HPI [name] presents for f/up -  She is tolerating Namenda and Aricept well and wants to continue taking them.  She denies chest pain, shortness of breath, diaphoresis, or edema.  Outpatient Medications Prior to Visit  Medication Sig Dispense Refill   AMBULATORY NON FORMULARY MEDICATION Folding walker with front wheels.  Use as needed. Disp 1 R26.89 1 each 0   AMBULATORY NON FORMULARY MEDICATION Folding walker with front wheels. Dispense 1 Dx code: R26.89 Use as needed 1 Device 0   aspirin EC 325 MG tablet Take 1 tablet (325 mg total) by mouth daily. 30 tablet 0   Blood Glucose Calibration (ACCU-CHEK GUIDE CONTROL) LIQD 1 Act by In Vitro route 2 (two) times daily as needed. 1 each 3   Blood Glucose Monitoring Suppl (ACCU-CHEK GUIDE ME) w/Device KIT 1 Act by Does not apply route 2 (two) times daily as needed. 2 kit 2   carvedilol (COREG) 6.25 MG tablet Take 1 tablet (6.25 mg total) by mouth 2 (two) times daily. 180 tablet 2   diclofenac Sodium (VOLTAREN) 1 % GEL Apply 4 g topically 4 (four) times daily. 100 g 5   diltiazem (TIADYLT ER) 180 MG 24 hr capsule Take 1 capsule (180 mg total) by mouth daily. 100 capsule 1   empagliflozin (JARDIANCE) 10 MG TABS tablet Take 1 tablet (10 mg total) by mouth daily before breakfast. 90 tablet 1   FLUoxetine (PROZAC) 40 MG capsule TAKE 1 CAPSULE EVERY DAY 90 capsule 1   glucose blood (ACCU-CHEK GUIDE) test strip 1 each by Other route 2 (two) times daily. 300 each 1   levothyroxine (SYNTHROID) 50 MCG tablet TAKE 1 TABLET EVERY DAY 100 tablet 0   losartan (COZAAR) 100 MG tablet TAKE 1 TABLET EVERY DAY 90 tablet 1   mirabegron ER (MYRBETRIQ) 50 MG TB24 tablet Take 1 tablet (50 mg total) by mouth daily. 100 tablet 1   Multiple Vitamins-Minerals (CENTRUM SILVER PO) Take by  mouth daily.     potassium chloride SA (KLOR-CON M) 20 MEQ tablet TAKE 1 TABLET EVERY DAY 90 tablet 1   PRED FORTE 1 % ophthalmic suspension SMARTSIG:In Eye(s)     rosuvastatin (CRESTOR) 10 MG tablet TAKE 1 TABLET EVERY DAY 100 tablet 0   triamcinolone cream (KENALOG) 0.5 %      donepezil (ARICEPT ODT) 10 MG disintegrating tablet DISSOLVE 1 TABLET ON THE TONGUE AT BEDTIME 90 tablet 0   memantine (NAMENDA XR) 14 MG CP24 24 hr capsule TAKE 1 CAPSULE EVERY DAY 90 capsule 0   No facility-administered medications prior to visit.    ROS Review of Systems  Constitutional: Negative.  Negative for diaphoresis and fatigue.  HENT: Negative.    Eyes: Negative.   Respiratory:  Negative for cough, chest tightness, shortness of breath and wheezing.   Cardiovascular:  Negative for chest pain, palpitations and leg swelling.  Gastrointestinal:  Negative for abdominal pain, diarrhea, nausea and vomiting.  Endocrine: Negative.   Genitourinary: Negative.  Negative for difficulty urinating.  Musculoskeletal: Negative.  Negative for arthralgias and myalgias.  Skin: Negative.   Neurological: Negative.  Negative for dizziness, weakness, light-headedness and numbness.  Hematological:  Negative for adenopathy. Does not bruise/bleed easily.  Psychiatric/Behavioral:  Positive for confusion and decreased concentration. Negative for sleep disturbance. The patient is not nervous/anxious.  Objective:  BP (!) 144/60 (BP Location: Right Arm, Patient Position: Sitting, Cuff Size: Large)   Pulse 65   Temp 97.9 F (36.6 C) (Oral)   Ht _0  (1.499 m)   Wt 141 lb (64 kg)   SpO2 98%   BMI 28.48 kg/m   BP Readings from Last 3 Encounters:  02/15/22 (!) 144/60  12/25/21 128/74  11/20/21 130/74    Wt Readings from Last 3 Encounters:  02/15/22 141 lb (64 kg)  12/25/21 146 lb (66.2 kg)  12/18/21 146 lb (66.2 kg)    Physical Exam Vitals reviewed.  HENT:     Nose: Nose normal.     Mouth/Throat:      Mouth: Mucous membranes are moist.  Eyes:     General: No scleral icterus.    Conjunctiva/sclera: Conjunctivae normal.  Cardiovascular:     Rate and Rhythm: Normal rate and regular rhythm.     Heart sounds: No murmur heard. Pulmonary:     Effort: Pulmonary effort is normal.     Breath sounds: No stridor. No wheezing, rhonchi or rales.  Abdominal:     General: Abdomen is flat.     Palpations: There is no mass.     Tenderness: There is no abdominal tenderness. There is no guarding.     Hernia: No hernia is present.  Musculoskeletal:        General: Normal range of motion.     Cervical back: Neck supple.     Right lower leg: No edema.     Left lower leg: No edema.  Lymphadenopathy:     Cervical: No cervical adenopathy.  Skin:    General: Skin is warm and dry.  Neurological:     General: No focal deficit present.     Mental Status: She is alert. Mental status is at baseline.  Psychiatric:        Mood and Affect: Mood normal.        Behavior: Behavior normal.     Lab Results  Component Value Date   WBC 6.8 02/15/2022   HGB 13.3 02/15/2022   HCT 39.8 02/15/2022   PLT 230.0 02/15/2022   GLUCOSE 122 (H) 02/15/2022   CHOL 150 09/14/2021   TRIG 95.0 09/14/2021   HDL 72.00 09/14/2021   LDLDIRECT 120.6 11/24/2012   LDLCALC 59 09/14/2021   ALT 24 09/14/2021   AST 28 09/14/2021   NA 139 02/15/2022   K 4.0 02/15/2022   CL 106 02/15/2022   CREATININE 0.93 02/15/2022   BUN 14 02/15/2022   CO2 26 02/15/2022   TSH 1.64 02/15/2022   INR 1.0 11/20/2021   HGBA1C 6.1 02/15/2022   MICROALBUR 1.7 02/15/2022    MM DIAG BREAST TOMO BILATERAL  Result Date: 02/01/2022 CLINICAL DATA:  81 year old female with bilateral breast discomfort. In addition, the patient has had left breast surgery approximately 30 years ago for cyst removal (patient states surgeon went directly through her left nipple) and she would like to make sure her site of prior breast surgery is stable. EXAM: DIGITAL  DIAGNOSTIC BILATERAL MAMMOGRAM WITH TOMOSYNTHESIS; ULTRASOUND LEFT BREAST LIMITED TECHNIQUE: Bilateral digital diagnostic mammography and breast tomosynthesis was performed.; Targeted ultrasound examination of the left breast was performed. COMPARISON:  Previous exam(s). ACR Breast Density Category c: The breast tissue is heterogeneously dense, which may obscure small masses. FINDINGS: No suspicious masses or calcifications are seen in either breast. Postsurgical appearance of the left breast is felt to be stable dating back to prior mammograms  from 2007. There is no mammographic evidence of malignancy in either breast. Targeted ultrasound at the site of most tenderness in the outer left breast was performed. No suspicious masses or abnormality seen, only normal-appearing fibroglandular tissue identified. In addition, sonographic evaluation of patient's prior site of surgery was performed demonstrating only expected postsurgical change with no suspicious masses or any other worrisome abnormalities identified. IMPRESSION: No findings of malignancy in either breast. RECOMMENDATION: 1. Recommend further management of bilateral breast tender be based on clinical assessment. 2.  Screening mammogram in one year.(Code:SM-B-01Y) I have discussed the findings and recommendations with the patient. If applicable, a reminder letter will be sent to the patient regarding the next appointment. BI-RADS CATEGORY  1: Negative. Electronically Signed   By: Everlean Alstrom M.D.   On: 02/01/2022 12:40   US BREAST LTD UNI LEFT INC AXILLA  Result Date: 02/01/2022 CLINICAL DATA:  81 year old female with bilateral breast discomfort. In addition, the patient has had left breast surgery approximately 30 years ago for cyst removal (patient states surgeon went directly through her left nipple) and she would like to make sure her site of prior breast surgery is stable. EXAM: DIGITAL DIAGNOSTIC BILATERAL MAMMOGRAM WITH TOMOSYNTHESIS;  ULTRASOUND LEFT BREAST LIMITED TECHNIQUE: Bilateral digital diagnostic mammography and breast tomosynthesis was performed.; Targeted ultrasound examination of the left breast was performed. COMPARISON:  Previous exam(s). ACR Breast Density Category c: The breast tissue is heterogeneously dense, which may obscure small masses. FINDINGS: No suspicious masses or calcifications are seen in either breast. Postsurgical appearance of the left breast is felt to be stable dating back to prior mammograms from 2007. There is no mammographic evidence of malignancy in either breast. Targeted ultrasound at the site of most tenderness in the outer left breast was performed. No suspicious masses or abnormality seen, only normal-appearing fibroglandular tissue identified. In addition, sonographic evaluation of patient's prior site of surgery was performed demonstrating only expected postsurgical change with no suspicious masses or any other worrisome abnormalities identified. IMPRESSION: No findings of malignancy in either breast. RECOMMENDATION: 1. Recommend further management of bilateral breast tender be based on clinical assessment. 2.  Screening mammogram in one year.(Code:SM-B-01Y) I have discussed the findings and recommendations with the patient. If applicable, a reminder letter will be sent to the patient regarding the next appointment. BI-RADS CATEGORY  1: Negative. Electronically Signed   By: Everlean Alstrom M.D.   On: 02/01/2022 12:40   Assessment & Plan:   [name] was seen today for hypertension, hypothyroidism, congestive heart failure and diabetes.  Diagnoses and all orders for this visit:  Acquired hypothyroidism- She is euthyroid. -     TSH; Future -     TSH  Type II diabetes mellitus with manifestations ([place])- Her blood sugar is very well-controlled. -     Hemoglobin A1c; Future -     Microalbumin / creatinine urine ratio; Future -     Microalbumin / creatinine urine ratio -     Hemoglobin  A1c  Stage 3b chronic kidney disease ([place])- Her renal function is stable. -     CBC with Differential/Platelet; Future -     Basic metabolic panel; Future -     Urinalysis, Routine w reflex microscopic; Future -     Microalbumin / creatinine urine ratio; Future -     Microalbumin / creatinine urine ratio -     Urinalysis, Routine w reflex microscopic -     Basic metabolic panel -     CBC with  Differential/Platelet  Essential hypertension- Her blood pressure is well-controlled. -     CBC with Differential/Platelet; Future -     Basic metabolic panel; Future -     TSH; Future -     Urinalysis, Routine w reflex microscopic; Future -     Urinalysis, Routine w reflex microscopic -     TSH -     Basic metabolic panel -     CBC with Differential/Platelet  Mild late onset Alzheimer's dementia without behavioral disturbance, psychotic disturbance, mood disturbance, or anxiety (HCC) -     memantine (NAMENDA XR) 14 MG CP24 24 hr capsule; Take 1 capsule (14 mg total) by mouth daily. -     donepezil (ARICEPT ODT) 10 MG disintegrating tablet; Take 1 tablet (10 mg total) by mouth at bedtime.   I have changed [name]'s memantine and donepezil. I am also having her maintain her Multiple Vitamins-Minerals (CENTRUM SILVER PO), diclofenac Sodium, triamcinolone cream, Accu-Chek Guide Control, Accu-Chek Guide Me, Accu-Chek Guide, AMBULATORY NON FORMULARY MEDICATION, AMBULATORY NON FORMULARY MEDICATION, mirabegron ER, carvedilol, potassium chloride SA, losartan, FLUoxetine, diltiazem, Pred Forte, aspirin EC, rosuvastatin, levothyroxine, and empagliflozin.  Meds ordered this encounter  Medications   memantine (NAMENDA XR) 14 MG CP24 24 hr capsule    Sig: Take 1 capsule (14 mg total) by mouth daily.    Dispense:  90 capsule    Refill:  0   donepezil (ARICEPT ODT) 10 MG disintegrating tablet    Sig: Take 1 tablet (10 mg total) by mouth at bedtime.    Dispense:  90 tablet    Refill:  0      Follow-up: Return in about 6 months (around 08/16/2022).  Scarlette Calico, MD

## 2022-02-16 MED ORDER — MEMANTINE HCL ER 14 MG PO CP24: 14.0000 mg | ORAL_CAPSULE | Freq: Every day | ORAL | 0 refills | Status: DC

## 2022-02-16 MED ORDER — DONEPEZIL HCL 10 MG PO TBDP: 10.0000 mg | ORAL_TABLET | Freq: Every day | ORAL | 0 refills | Status: DC

## 2022-03-13 ENCOUNTER — Ambulatory Visit: Payer: Medicare HMO | Admitting: Internal Medicine

## 2022-03-13 IMAGING — DX DG HAND COMPLETE 3+V*R*
3 series · 3 of 3 positions shown · non-contrast
Comparison: None.

CLINICAL DATA: Atraumatic right hand and right wrist pain.

EXAM:
RIGHT HAND - COMPLETE 3+ VIEW

[hand ap]
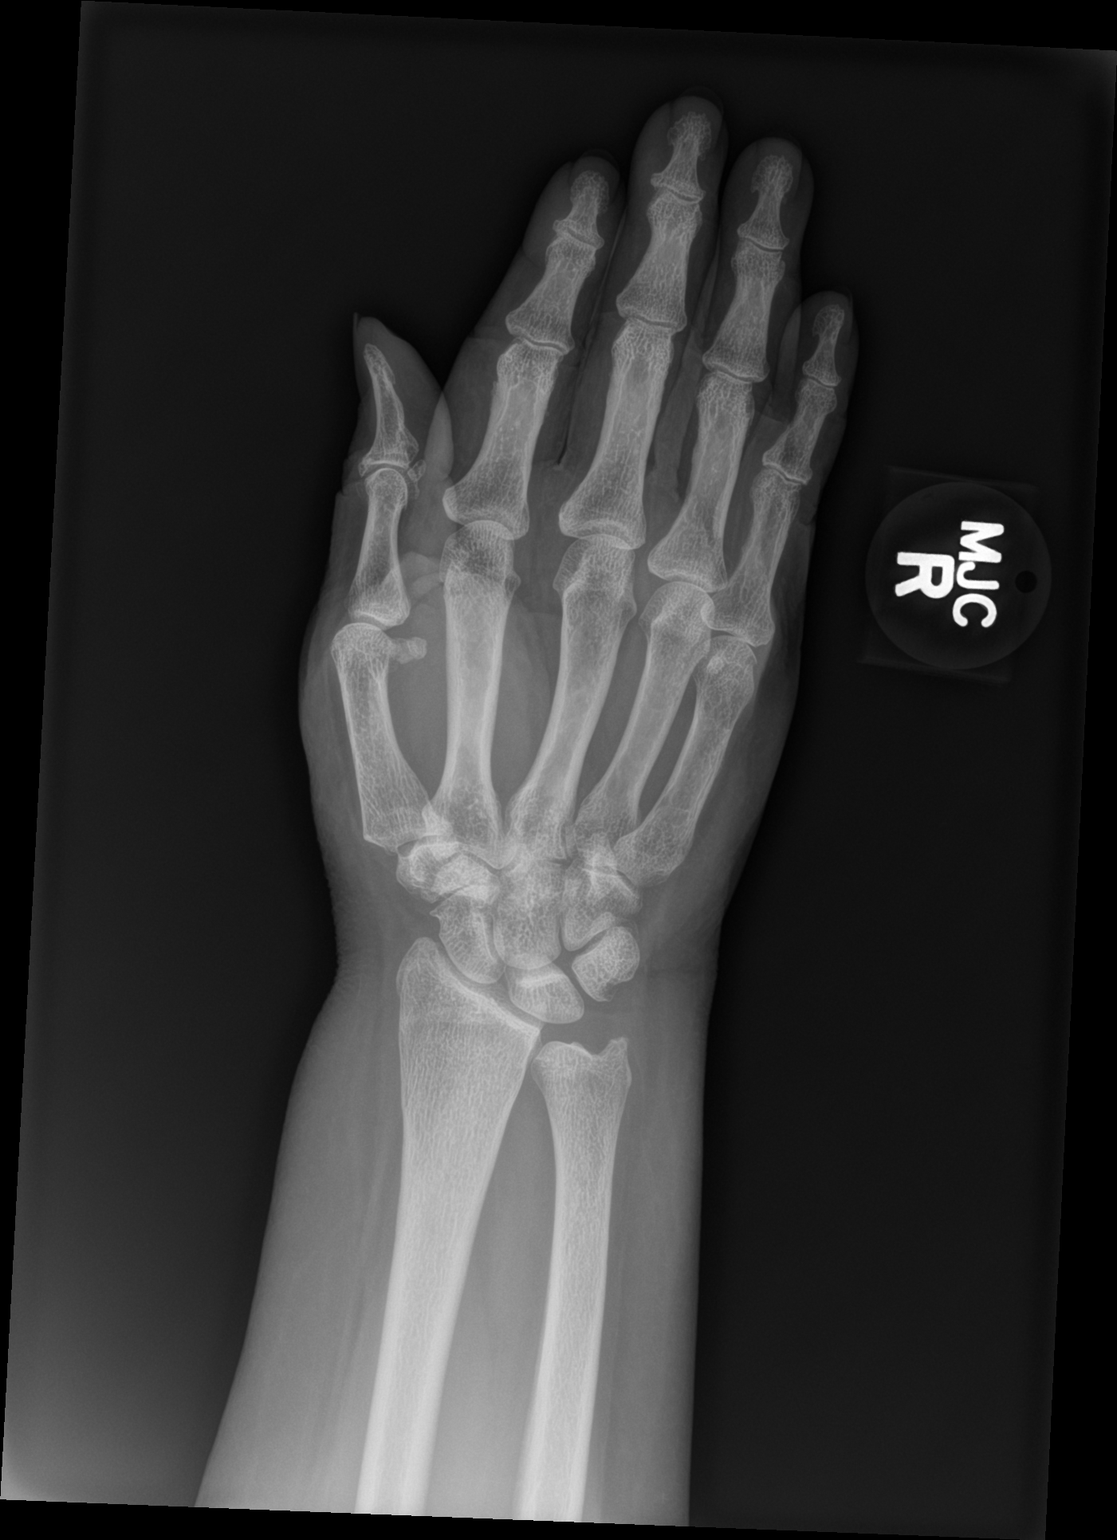

[hand obl]
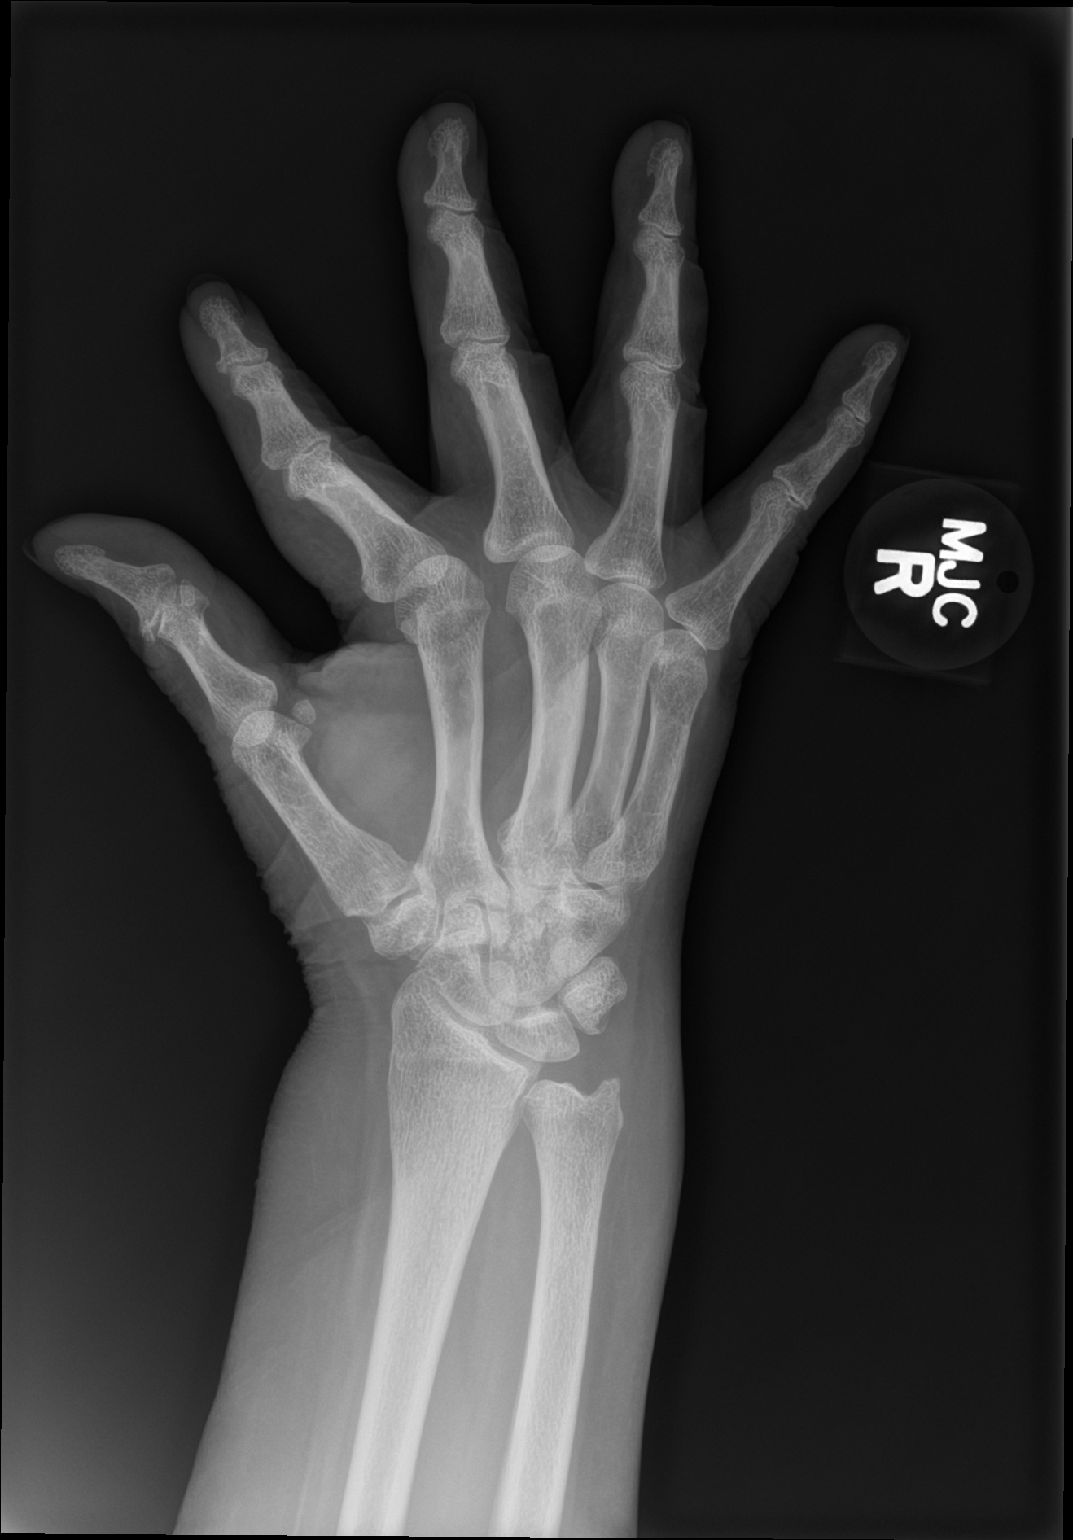

[hand lat]
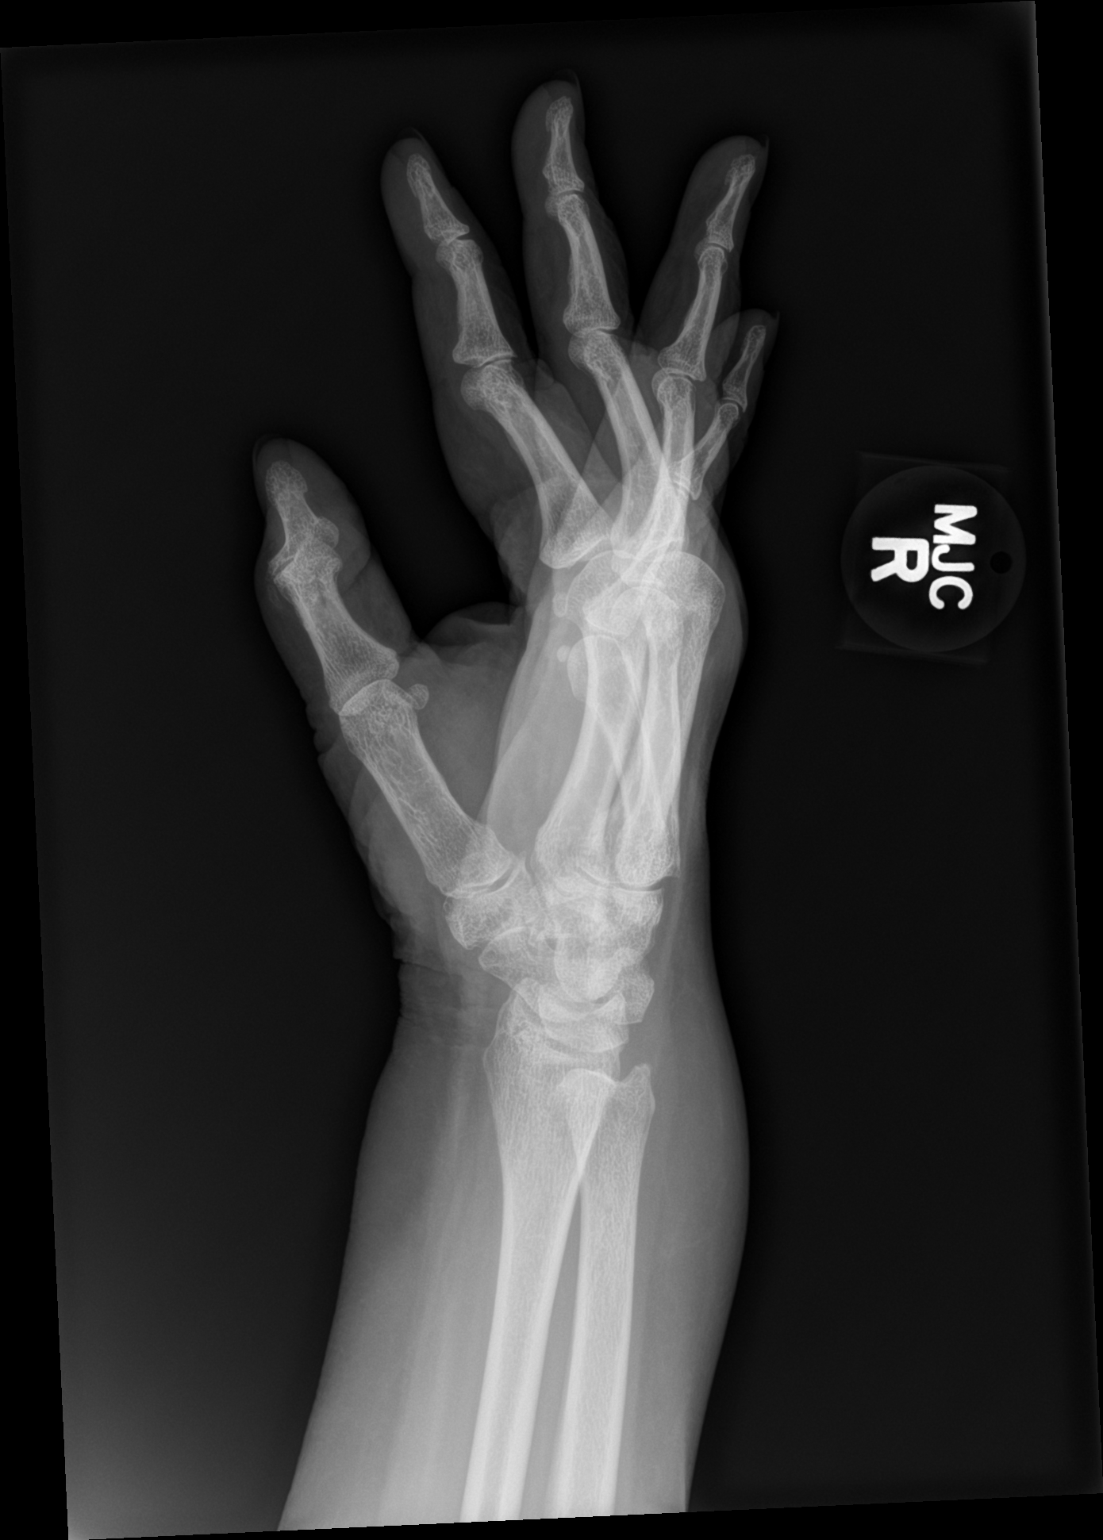

[3 of 3 positions shown; findings below may reference images not displayed]

FINDINGS: There is no evidence of an acute fracture or dislocation. There is
no evidence of arthropathy or other focal bone abnormality. Moderate
severity soft tissue swelling is seen along the distal right
forearm.
IMPRESSION: 1. No acute osseous abnormality.
2. Soft tissue swelling along the distal right forearm.

## 2022-03-26 ENCOUNTER — Telehealth: Payer: Self-pay | Admitting: *Deleted

## 2022-03-26 NOTE — Progress Notes (Deleted)
   I, [name], LAT, ATC acting as a scribe for [name], MD.  [name] is an 82 y.o. female who presents to [facility] at [facility] today for f/u chronic bilat knee pain. Pt was last seen by Dr. Georgina Snell on 12/25/21 and was given bilat knee steroid injections. Today, pt reports  Dx testing: 09/30/20 L-spine MRI  08/08/20 R & L knee XR, and L-spine XR             01/05/21 R hand XR             01/05/21 Labs  Pertinent review of systems: ***  Relevant historical information: ***   Exam:  There were no vitals taken for this visit. General: Well Developed, well nourished, and in no acute distress.   MSK: ***    Lab and Radiology Results No results found for this or any previous visit (from the past 72 hour(s)). No results found.     Assessment and Plan: 82 y.o. female with ***   PDMP not reviewed this encounter. No orders of the defined types were placed in this encounter.  No orders of the defined types were placed in this encounter.    Discussed warning signs or symptoms. Please see discharge instructions. Patient expresses understanding.   ***

## 2022-03-26 NOTE — Telephone Encounter (Signed)
-----   Message from Mare Ferrari sent at 03/15/2022  2:59 PM EST ----- Regarding: auth Please authorize Zilretta for both knees.

## 2022-03-26 NOTE — Telephone Encounter (Signed)
Bilateral Zilretta initiated through portal.

## 2022-03-27 ENCOUNTER — Ambulatory Visit: Payer: Medicare HMO | Admitting: Family Medicine

## 2022-03-27 ENCOUNTER — Ambulatory Visit: Payer: Medicare HMO | Admitting: Internal Medicine

## 2022-03-30 NOTE — Telephone Encounter (Signed)
Resubmitted with updated insurance.

## 2022-04-03 NOTE — Telephone Encounter (Signed)
Prior auth form completed & faxed.

## 2022-04-05 NOTE — Telephone Encounter (Signed)
Bilateral zilretta approved. 04/04/2022-07/04/2022.

## 2022-05-10 ENCOUNTER — Ambulatory Visit: Payer: Medicare HMO | Admitting: Behavioral Health

## 2022-05-10 DIAGNOSIS — Z8659 Personal history of other mental and behavioral disorders: Secondary | ICD-10-CM

## 2022-05-10 DIAGNOSIS — F32A Depression, unspecified: Secondary | ICD-10-CM | POA: Diagnosis not present

## 2022-05-10 DIAGNOSIS — G4700 Insomnia, unspecified: Secondary | ICD-10-CM

## 2022-05-10 DIAGNOSIS — R69 Illness, unspecified: Secondary | ICD-10-CM | POA: Diagnosis not present
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
## 2022-05-12 ENCOUNTER — Encounter: Payer: Self-pay | Admitting: Behavioral Health

## 2022-05-15 ENCOUNTER — Telehealth: Payer: Self-pay | Admitting: Internal Medicine

## 2022-05-15 NOTE — Telephone Encounter (Signed)
Patient requested a callback from a nurse. She declined to give any information about what she needed. Best callback number is [phone].

## 2022-05-16 NOTE — Telephone Encounter (Signed)
Called pt back she wanted to schedule an appointment on the days transportation services are available offered what was available but pt states she will call back to have confirming dates and times she can do.

## 2022-05-16 NOTE — Telephone Encounter (Signed)
Patient wants [name] to call her back.

## 2022-05-24 ENCOUNTER — Encounter: Payer: Self-pay | Admitting: Family Medicine

## 2022-05-24 ENCOUNTER — Ambulatory Visit: Payer: Medicare HMO | Admitting: Family Medicine

## 2022-05-24 ENCOUNTER — Ambulatory Visit: Payer: Self-pay

## 2022-05-24 VITALS — BP 136/86 | HR 62 | Ht 59.0 in | Wt 142.6 lb

## 2022-05-24 DIAGNOSIS — G8929 Other chronic pain: Secondary | ICD-10-CM | POA: Diagnosis not present

## 2022-05-24 DIAGNOSIS — M25562 Pain in left knee: Secondary | ICD-10-CM

## 2022-05-24 DIAGNOSIS — M25561 Pain in right knee: Secondary | ICD-10-CM

## 2022-05-24 NOTE — Progress Notes (Signed)
I, [name], CMA acting as a scribe for Gina Leader, MD.  [name] is a 82 y.o. female who presents to [place] at [facility] today for cont'd bilat knee pain. Pt was last seen by Dr. Georgina Snell on 12/25/21 and was given bilat knee steroid injections. Today, pt reports being past due for knee injections. Both knees have been bothersome recently. Denies mechanical sx or swelling. Slow to stand.    Pt also c/o swelling and throbbing in the right hand and arm.   Dx testing: 09/30/20 L-spine MRI  08/08/20 R & L knee XR, and L-spine XR             01/05/21 R hand XR             01/05/21 Labs  Pertinent review of systems: No fevers or chills.  Positive for rectal bleeding.  Has a follow-up appointment scheduled with PCP April 4.  Relevant historical information: Heart failure.  Diabetes.   Exam:  BP 136/86   Pulse 62   Ht '4\' 11"'$  (1.499 m)   Wt 142 lb 9.6 oz (64.7 kg)   SpO2 97%   BMI 28.80 kg/m  General: Well Developed, well nourished, and in no acute distress.   MSK: Right knee: Mild effusion normal motion with crepitation.  Intact strength.  Left knee: Mild effusion normal motion with crepitation.  Intact strength.    Lab and Radiology Results  Procedure: Real-time Ultrasound Guided Injection of right knee superior lateral patellar space Device: Philips Affiniti 50G Images permanently stored and available for review in PACS Verbal informed consent obtained.  Discussed risks and benefits of procedure. Warned about infection, bleeding, hyperglycemia damage to structures among others. Patient expresses understanding and agreement Time-out conducted.   Noted no overlying erythema, induration, or other signs of local infection.   Skin prepped in a sterile fashion.   Local anesthesia: Topical Ethyl chloride.   With sterile technique and under real time ultrasound guidance: 40 mg of Kenalog and 2 ml Marcaine injected into knee joint. Fluid seen entering  the joint capsule.   Completed without difficulty   Pain immediately resolved suggesting accurate placement of the medication.   Advised to call if fevers/chills, erythema, induration, drainage, or persistent bleeding.   Images permanently stored and available for review in the ultrasound unit.  Impression: Technically successful ultrasound guided injection.    Procedure: Real-time Ultrasound Guided Injection of left knee superior lateral patellar space Device: Philips Affiniti 50G Images permanently stored and available for review in PACS Verbal informed consent obtained.  Discussed risks and benefits of procedure. Warned about infection, bleeding, hyperglycemia damage to structures among others. Patient expresses understanding and agreement Time-out conducted.   Noted no overlying erythema, induration, or other signs of local infection.   Skin prepped in a sterile fashion.   Local anesthesia: Topical Ethyl chloride.   With sterile technique and under real time ultrasound guidance: 40 mg of Kenalog and 2 mL Marcaine injected into knee joint. Fluid seen entering the joint capsule.   Completed without difficulty   Pain immediately resolved suggesting accurate placement of the medication.   Advised to call if fevers/chills, erythema, induration, drainage, or persistent bleeding.   Images permanently stored and available for review in the ultrasound unit.  Impression: Technically successful ultrasound guided injection.        Assessment and Plan: 82 y.o. female with bilateral knee pain due to DJD.  Plan for steroid injection bilaterally.  Last injection  lasted about 3 months.  Plan to recheck in 3 months.  Anticipate repeat steroid injection at that time.  She has a follow-up appointment scheduled with her PCP to address rectal bleeding on April 4.  Communicated with PCP.  He is aware.   PDMP not reviewed this encounter. Orders Placed This Encounter  Procedures   [place] LIMITED JOINT  SPACE STRUCTURES LOW BILAT(NO LINKED CHARGES)    Order Specific Question:   Reason for Exam (SYMPTOM  OR DIAGNOSIS REQUIRED)    Answer:   bilateral knee pain    Order Specific Question:   Preferred imaging location?    Answer:   Early   No orders of the defined types were placed in this encounter.    Discussed warning signs or symptoms. Please see discharge instructions. Patient expresses understanding.   The above documentation has been reviewed and is accurate and complete [name], M.D.

## 2022-05-24 NOTE — Patient Instructions (Signed)
Thank you for coming in today.   You received an injection today. Seek immediate medical attention if the joint becomes red, extremely painful, or is oozing fluid.   Check back in 3 months

## 2022-05-29 ENCOUNTER — Encounter: Payer: Self-pay | Admitting: Internal Medicine

## 2022-05-29 ENCOUNTER — Ambulatory Visit (INDEPENDENT_AMBULATORY_CARE_PROVIDER_SITE_OTHER): Payer: Medicare HMO | Admitting: Internal Medicine

## 2022-05-29 VITALS — BP 150/78 | HR 67 | Temp 98.5°F | Resp 16 | Ht 59.0 in | Wt 141.0 lb

## 2022-05-29 DIAGNOSIS — N1832 Chronic kidney disease, stage 3b: Secondary | ICD-10-CM

## 2022-05-29 DIAGNOSIS — E039 Hypothyroidism, unspecified: Secondary | ICD-10-CM

## 2022-05-29 DIAGNOSIS — I10 Essential (primary) hypertension: Secondary | ICD-10-CM

## 2022-05-29 DIAGNOSIS — E118 Type 2 diabetes mellitus with unspecified complications: Secondary | ICD-10-CM | POA: Diagnosis not present

## 2022-05-29 LAB — BASIC METABOLIC PANEL
BUN: 22 mg/dL (ref 6–23)
CO2: 20 mEq/L (ref 19–32)
Calcium: 8.8 mg/dL (ref 8.4–10.5)
Chloride: 107 mEq/L (ref 96–112)
Creatinine, Ser: 0.73 mg/dL (ref 0.40–1.20)
GFR: 76.29 mL/min (ref >60.00)
Glucose, Bld: 99 mg/dL (ref 70–99)
Potassium: 4 mEq/L (ref 3.5–5.1)
Sodium: 135 mEq/L (ref 135–145)

## 2022-05-29 LAB — CBC WITH DIFFERENTIAL/PLATELET
Basophils Absolute: 0.1 10*3/uL (ref 0.0–0.1)
Basophils Relative: 0.5 % (ref 0.0–3.0)
Eosinophils Absolute: 0 10*3/uL (ref 0.0–0.7)
Eosinophils Relative: 0.4 % (ref 0.0–5.0)
HCT: 42.1 % (ref 36.0–46.0)
Hemoglobin: 14 g/dL (ref 12.0–15.0)
Lymphocytes Relative: 14.3 % (ref 12.0–46.0)
Lymphs Abs: 1.4 10*3/uL (ref 0.7–4.0)
MCHC: 33.3 g/dL (ref 30.0–36.0)
MCV: 92.4 fl (ref 78.0–100.0)
Monocytes Absolute: 0.7 10*3/uL (ref 0.1–1.0)
Monocytes Relative: 7 % (ref 3.0–12.0)
Neutro Abs: 7.5 10*3/uL (ref 1.4–7.7)
Neutrophils Relative %: 77.8 % — ABNORMAL HIGH (ref 43.0–77.0)
Platelets: 232 10*3/uL (ref 150.0–400.0)
RBC: 4.56 Mil/uL (ref 3.87–5.11)
RDW: 15.2 % (ref 11.5–15.5)
WBC: 9.6 10*3/uL (ref 4.0–10.5)

## 2022-05-29 LAB — HEMOGLOBIN A1C: Hgb A1c MFr Bld: 5.9 % (ref 4.6–6.5)

## 2022-05-29 LAB — TSH: TSH: 2.2 u[IU]/mL (ref 0.35–5.50)

## 2022-05-29 NOTE — Progress Notes (Signed)
Subjective:  Patient ID: [name], female    DOB: [birth year]  Age: 82 y.o. MRN: [id number]  CC: Hypertension, Diabetes, and Hypothyroidism   HPI [name] presents for f/up ---  2 weeks ago she had a few episodes of painless bright red blood per rectum.  This has not recurred over the last week.  She denies abdominal pain, nausea, vomiting, weight loss, or loss of appetite.  Outpatient Medications Prior to Visit  Medication Sig Dispense Refill   aspirin EC 325 MG tablet Take 1 tablet (325 mg total) by mouth daily. 30 tablet 0   Blood Glucose Calibration (ACCU-CHEK GUIDE CONTROL) LIQD 1 Act by In Vitro route 2 (two) times daily as needed. 1 each 3   Blood Glucose Monitoring Suppl (ACCU-CHEK GUIDE ME) w/Device KIT 1 Act by Does not apply route 2 (two) times daily as needed. 2 kit 2   carvedilol (COREG) 6.25 MG tablet Take 1 tablet (6.25 mg total) by mouth 2 (two) times daily. 180 tablet 2   clobetasol ointment (TEMOVATE) [id number] % Apply 1 Application topically 2 (two) times daily.     diclofenac Sodium (VOLTAREN) 1 % GEL Apply 4 g topically 4 (four) times daily. 100 g 5   diltiazem (TIADYLT ER) 180 MG 24 hr capsule Take 1 capsule (180 mg total) by mouth daily. 100 capsule 1   donepezil (ARICEPT ODT) 10 MG disintegrating tablet Take 1 tablet (10 mg total) by mouth at bedtime. 90 tablet 0   empagliflozin (JARDIANCE) 10 MG TABS tablet Take 1 tablet (10 mg total) by mouth daily before breakfast. 90 tablet 1   FLUAD QUADRIVALENT 0.5 ML injection Inject 0.5 mLs into the muscle once.     FLUoxetine (PROZAC) 40 MG capsule TAKE 1 CAPSULE EVERY DAY 90 capsule 1   glucose blood (ACCU-CHEK GUIDE) test strip 1 each by Other route 2 (two) times daily. 300 each 1   levothyroxine (SYNTHROID) 50 MCG tablet TAKE 1 TABLET EVERY DAY 100 tablet 0   losartan (COZAAR) 100 MG tablet TAKE 1 TABLET EVERY DAY 90 tablet 1   memantine (NAMENDA XR) 14 MG CP24 24 hr capsule Take 1 capsule (14 mg total) by mouth  daily. 90 capsule 0   mirabegron ER (MYRBETRIQ) 50 MG TB24 tablet Take 1 tablet (50 mg total) by mouth daily. 100 tablet 1   Multiple Vitamins-Minerals (CENTRUM SILVER PO) Take by mouth daily.     potassium chloride SA (KLOR-CON M) 20 MEQ tablet TAKE 1 TABLET EVERY DAY 90 tablet 1   SHINGRIX injection Inject 0.5 mLs into the muscle once.     triamcinolone cream (KENALOG) 0.5 %      AMBULATORY NON FORMULARY MEDICATION Folding walker with front wheels.  Use as needed. Disp 1 R26.89 1 each 0   AMBULATORY NON FORMULARY MEDICATION Folding walker with front wheels. Dispense 1 Dx code: R26.89 Use as needed 1 Device 0   PRED FORTE 1 % ophthalmic suspension SMARTSIG:In Eye(s) (Patient not taking: Reported on 05/29/2022)     rosuvastatin (CRESTOR) 10 MG tablet TAKE 1 TABLET EVERY DAY (Patient not taking: Reported on 05/29/2022) 100 tablet 0   No facility-administered medications prior to visit.    ROS Review of Systems  Constitutional: Negative.  Negative for appetite change, diaphoresis, fatigue and unexpected weight change.  HENT: Negative.    Eyes: Negative.   Respiratory:  Negative for chest tightness, shortness of breath and wheezing.   Cardiovascular:  Negative for chest pain, palpitations and leg  swelling.  Gastrointestinal:  Positive for anal bleeding. Negative for abdominal pain, blood in stool, diarrhea, nausea and vomiting.  Endocrine: Negative.   Genitourinary: Negative.  Negative for difficulty urinating.  Musculoskeletal: Negative.  Negative for back pain and myalgias.  Skin: Negative.  Negative for color change and pallor.  Neurological: Negative.  Negative for dizziness.  Hematological:  Negative for adenopathy. Does not bruise/bleed easily.  Psychiatric/Behavioral: Negative.      Objective:  BP (!) 150/78 (BP Location: Left Arm, Patient Position: Sitting, Cuff Size: Normal)   Pulse 67   Temp 98.5 F (36.9 C) (Oral)   Ht 4\' 11"  (1.499 m)   Wt 141 lb (64 kg)   SpO2 98%    BMI 28.48 kg/m   BP Readings from Last 3 Encounters:  05/29/22 (!) 150/78  05/24/22 136/86  02/15/22 (!) 144/60    Wt Readings from Last 3 Encounters:  05/29/22 141 lb (64 kg)  05/24/22 142 lb 9.6 oz (64.7 kg)  02/15/22 141 lb (64 kg)    Physical Exam Vitals reviewed. Exam conducted with a chaperone present (Shirron).  HENT:     Nose: Nose normal.     Mouth/Throat:     Mouth: Mucous membranes are moist.  Eyes:     General: No scleral icterus.    Conjunctiva/sclera: Conjunctivae normal.  Cardiovascular:     Rate and Rhythm: Normal rate and regular rhythm.     Heart sounds: No murmur heard.    No gallop.  Pulmonary:     Effort: Pulmonary effort is normal.     Breath sounds: No stridor. No wheezing, rhonchi or rales.  Abdominal:     General: Abdomen is protuberant. Bowel sounds are normal. There is no distension.     Palpations: Abdomen is soft. There is no hepatomegaly, splenomegaly or mass.     Tenderness: There is no abdominal tenderness.  Genitourinary:    Rectum: Normal. Guaiac result negative. No mass, tenderness, anal fissure, external hemorrhoid or internal hemorrhoid. Normal anal tone.  Musculoskeletal:        General: Normal range of motion.     Cervical back: Neck supple.     Right lower leg: No edema.     Left lower leg: No edema.  Lymphadenopathy:     Cervical: No cervical adenopathy.  Skin:    General: Skin is warm and dry.  Neurological:     General: No focal deficit present.     Mental Status: She is alert.  Psychiatric:        Mood and Affect: Mood normal.     Lab Results  Component Value Date   WBC 6.8 02/15/2022   HGB 13.3 02/15/2022   HCT 39.8 02/15/2022   PLT 230.0 02/15/2022   GLUCOSE 122 (H) 02/15/2022   CHOL 150 09/14/2021   TRIG 95.0 09/14/2021   HDL 72.00 09/14/2021   LDLDIRECT 120.6 11/24/2012   LDLCALC 59 09/14/2021   ALT 24 09/14/2021   AST 28 09/14/2021   NA 139 02/15/2022   K 4.0 02/15/2022   CL 106 02/15/2022    CREATININE 0.93 02/15/2022   BUN 14 02/15/2022   CO2 26 02/15/2022   TSH 1.64 02/15/2022   INR 1.0 11/20/2021   HGBA1C 6.1 02/15/2022   MICROALBUR 1.7 02/15/2022    MM DIAG BREAST TOMO BILATERAL  Result Date: 02/01/2022 CLINICAL DATA:  82 year old female with bilateral breast discomfort. In addition, the patient has had left breast surgery approximately 30 years ago for cyst removal (  patient states surgeon went directly through her left nipple) and she would like to make sure her site of prior breast surgery is stable. EXAM: DIGITAL DIAGNOSTIC BILATERAL MAMMOGRAM WITH TOMOSYNTHESIS; ULTRASOUND LEFT BREAST LIMITED TECHNIQUE: Bilateral digital diagnostic mammography and breast tomosynthesis was performed.; Targeted ultrasound examination of the left breast was performed. COMPARISON:  Previous exam(s). ACR Breast Density Category c: The breast tissue is heterogeneously dense, which may obscure small masses. FINDINGS: No suspicious masses or calcifications are seen in either breast. Postsurgical appearance of the left breast is felt to be stable dating back to prior mammograms from 2007. There is no mammographic evidence of malignancy in either breast. Targeted ultrasound at the site of most tenderness in the outer left breast was performed. No suspicious masses or abnormality seen, only normal-appearing fibroglandular tissue identified. In addition, sonographic evaluation of patient's prior site of surgery was performed demonstrating only expected postsurgical change with no suspicious masses or any other worrisome abnormalities identified. IMPRESSION: No findings of malignancy in either breast. RECOMMENDATION: 1. Recommend further management of bilateral breast tender be based on clinical assessment. 2.  Screening mammogram in one year.(Code:SM-B-01Y) I have discussed the findings and recommendations with the patient. If applicable, a reminder letter will be sent to the patient regarding the next  appointment. BI-RADS CATEGORY  1: Negative. Electronically Signed   By: Everlean Alstrom M.D.   On: 02/01/2022 12:40   US BREAST LTD UNI LEFT INC AXILLA  Result Date: 02/01/2022 CLINICAL DATA:  82 year old female with bilateral breast discomfort. In addition, the patient has had left breast surgery approximately 30 years ago for cyst removal (patient states surgeon went directly through her left nipple) and she would like to make sure her site of prior breast surgery is stable. EXAM: DIGITAL DIAGNOSTIC BILATERAL MAMMOGRAM WITH TOMOSYNTHESIS; ULTRASOUND LEFT BREAST LIMITED TECHNIQUE: Bilateral digital diagnostic mammography and breast tomosynthesis was performed.; Targeted ultrasound examination of the left breast was performed. COMPARISON:  Previous exam(s). ACR Breast Density Category c: The breast tissue is heterogeneously dense, which may obscure small masses. FINDINGS: No suspicious masses or calcifications are seen in either breast. Postsurgical appearance of the left breast is felt to be stable dating back to prior mammograms from 2007. There is no mammographic evidence of malignancy in either breast. Targeted ultrasound at the site of most tenderness in the outer left breast was performed. No suspicious masses or abnormality seen, only normal-appearing fibroglandular tissue identified. In addition, sonographic evaluation of patient's prior site of surgery was performed demonstrating only expected postsurgical change with no suspicious masses or any other worrisome abnormalities identified. IMPRESSION: No findings of malignancy in either breast. RECOMMENDATION: 1. Recommend further management of bilateral breast tender be based on clinical assessment. 2.  Screening mammogram in one year.(Code:SM-B-01Y) I have discussed the findings and recommendations with the patient. If applicable, a reminder letter will be sent to the patient regarding the next appointment. BI-RADS CATEGORY  1: Negative. Electronically  Signed   By: Everlean Alstrom M.D.   On: 02/01/2022 12:40   Assessment & Plan:   Brit was seen today for hypertension, diabetes and hypothyroidism.  Diagnoses and all orders for this visit:  Acquired hypothyroidism- She is euthyroid. -     TSH; Future  Type II diabetes mellitus with manifestations ([place])- Her blood sugar is well-controlled. -     Hemoglobin A1c; Future  Stage 3b chronic kidney disease ([place])- Her renal function is stable. -     Basic metabolic panel; Future -  CBC with Differential/Platelet; Future  Essential hypertension- Her blood pressure is well-controlled. -     CBC with Differential/Platelet; Future -     TSH; Future   I am having Deby Kindle maintain her Multiple Vitamins-Minerals (CENTRUM SILVER PO), diclofenac Sodium, triamcinolone cream, Accu-Chek Guide Control, Accu-Chek Guide Me, Accu-Chek Guide, AMBULATORY NON FORMULARY MEDICATION, AMBULATORY NON FORMULARY MEDICATION, mirabegron ER, carvedilol, potassium chloride SA, losartan, FLUoxetine, diltiazem, Pred Forte, aspirin EC, rosuvastatin, levothyroxine, empagliflozin, memantine, donepezil, clobetasol ointment, Fluad Quadrivalent, and Shingrix.  No orders of the defined types were placed in this encounter.    Follow-up: No follow-ups on file.  Scarlette Calico, MD

## 2022-05-29 NOTE — Patient Instructions (Signed)
Hypertension, Adult High blood pressure (hypertension) is when the force of blood pumping through the arteries is too strong. The arteries are the blood vessels that carry blood from the heart throughout the body. Hypertension forces the heart to work harder to pump blood and may cause arteries to become narrow or stiff. Untreated or uncontrolled hypertension can lead to a heart attack, heart failure, a stroke, kidney disease, and other problems. A blood pressure reading consists of a higher number over a lower number. Ideally, your blood pressure should be below 120/80. The first ("top") number is called the systolic pressure. It is a measure of the pressure in your arteries as your heart beats. The second ("bottom") number is called the diastolic pressure. It is a measure of the pressure in your arteries as the heart relaxes. What are the causes? The exact cause of this condition is not known. There are some conditions that result in high blood pressure. What increases the risk? Certain factors may make you more likely to develop high blood pressure. Some of these risk factors are under your control, including: Smoking. Not getting enough exercise or physical activity. Being overweight. Having too much fat, sugar, calories, or salt (sodium) in your diet. Drinking too much alcohol. Other risk factors include: Having a personal history of heart disease, diabetes, high cholesterol, or kidney disease. Stress. Having a family history of high blood pressure and high cholesterol. Having obstructive sleep apnea. Age. The risk increases with age. What are the signs or symptoms? High blood pressure may not cause symptoms. Very high blood pressure (hypertensive crisis) may cause: Headache. Fast or irregular heartbeats (palpitations). Shortness of breath. Nosebleed. Nausea and vomiting. Vision changes. Severe chest pain, dizziness, and seizures. How is this diagnosed? This condition is diagnosed by  measuring your blood pressure while you are seated, with your arm resting on a flat surface, your legs uncrossed, and your feet flat on the floor. The cuff of the blood pressure monitor will be placed directly against the skin of your upper arm at the level of your heart. Blood pressure should be measured at least twice using the same arm. Certain conditions can cause a difference in blood pressure between your right and left arms. If you have a high blood pressure reading during one visit or you have normal blood pressure with other risk factors, you may be asked to: Return on a different day to have your blood pressure checked again. Monitor your blood pressure at home for 1 week or longer. If you are diagnosed with hypertension, you may have other blood or imaging tests to help your health care provider understand your overall risk for other conditions. How is this treated? This condition is treated by making healthy lifestyle changes, such as eating healthy foods, exercising more, and reducing your alcohol intake. You may be referred for counseling on a healthy diet and physical activity. Your health care provider may prescribe medicine if lifestyle changes are not enough to get your blood pressure under control and if: Your systolic blood pressure is above 130. Your diastolic blood pressure is above 80. Your personal target blood pressure may vary depending on your medical conditions, your age, and other factors. Follow these instructions at home: Eating and drinking  Eat a diet that is high in fiber and potassium, and low in sodium, added sugar, and fat. An example of this eating plan is called the DASH diet. DASH stands for Dietary Approaches to Stop Hypertension. To eat this way: Eat   plenty of fresh fruits and vegetables. Try to fill one half of your plate at each meal with fruits and vegetables. Eat whole grains, such as whole-wheat pasta, brown rice, or whole-grain bread. Fill about one  fourth of your plate with whole grains. Eat or drink low-fat dairy products, such as skim milk or low-fat yogurt. Avoid fatty cuts of meat, processed or cured meats, and poultry with skin. Fill about one fourth of your plate with lean proteins, such as fish, chicken without skin, beans, eggs, or tofu. Avoid pre-made and processed foods. These tend to be higher in sodium, added sugar, and fat. Reduce your daily sodium intake. Many people with hypertension should eat less than 1,500 mg of sodium a day. Do not drink alcohol if: Your health care provider tells you not to drink. You are pregnant, may be pregnant, or are planning to become pregnant. If you drink alcohol: Limit how much you have to: 0-1 drink a day for women. 0-2 drinks a day for men. Know how much alcohol is in your drink. In the [place] one drink equals one 12 oz bottle of beer (355 mL), one 5 oz glass of wine (148 mL), or one 1 oz glass of hard liquor (44 mL). Lifestyle  Work with your health care provider to maintain a healthy body weight or to lose weight. Ask what an ideal weight is for you. Get at least 30 minutes of exercise that causes your heart to beat faster (aerobic exercise) most days of the week. Activities may include walking, swimming, or biking. Include exercise to strengthen your muscles (resistance exercise), such as Pilates or lifting weights, as part of your weekly exercise routine. Try to do these types of exercises for 30 minutes at least 3 days a week. Do not use any products that contain nicotine or tobacco. These products include cigarettes, chewing tobacco, and vaping devices, such as e-cigarettes. If you need help quitting, ask your health care provider. Monitor your blood pressure at home as told by your health care provider. Keep all follow-up visits. This is important. Medicines Take over-the-counter and prescription medicines only as told by your health care provider. Follow directions carefully. Blood  pressure medicines must be taken as prescribed. Do not skip doses of blood pressure medicine. Doing this puts you at risk for problems and can make the medicine less effective. Ask your health care provider about side effects or reactions to medicines that you should watch for. Contact a health care provider if you: Think you are having a reaction to a medicine you are taking. Have headaches that keep coming back (recurring). Feel dizzy. Have swelling in your ankles. Have trouble with your vision. Get help right away if you: Develop a severe headache or confusion. Have unusual weakness or numbness. Feel faint. Have severe pain in your chest or abdomen. Vomit repeatedly. Have trouble breathing. These symptoms may be an emergency. Get help right away. Call 911. Do not wait to see if the symptoms will go away. Do not drive yourself to the hospital. Summary Hypertension is when the force of blood pumping through your arteries is too strong. If this condition is not controlled, it may put you at risk for serious complications. Your personal target blood pressure may vary depending on your medical conditions, your age, and other factors. For most people, a normal blood pressure is less than 120/80. Hypertension is treated with lifestyle changes, medicines, or a combination of both. Lifestyle changes include losing weight, eating a healthy,   low-sodium diet, exercising more, and limiting alcohol. This information is not intended to replace advice given to you by your health care provider. Make sure you discuss any questions you have with your health care provider. Document Revised: 01/10/2021 Document Reviewed: 01/10/2021 Elsevier Patient Education  2023 [employer] Inc.  

## 2022-06-12 ENCOUNTER — Ambulatory Visit: Payer: Medicare HMO | Admitting: Adult Health

## 2022-06-21 ENCOUNTER — Ambulatory Visit: Payer: Medicare HMO | Admitting: Internal Medicine

## 2022-06-21 ENCOUNTER — Telehealth: Payer: Self-pay

## 2022-06-21 ENCOUNTER — Ambulatory Visit: Payer: Medicare HMO | Admitting: Family Medicine

## 2022-06-21 ENCOUNTER — Other Ambulatory Visit: Payer: Self-pay | Admitting: Internal Medicine

## 2022-06-21 VITALS — BP 102/78 | HR 57 | Ht 59.0 in | Wt 141.0 lb

## 2022-06-21 DIAGNOSIS — E785 Hyperlipidemia, unspecified: Secondary | ICD-10-CM

## 2022-06-21 DIAGNOSIS — R051 Acute cough: Secondary | ICD-10-CM

## 2022-06-21 DIAGNOSIS — E118 Type 2 diabetes mellitus with unspecified complications: Secondary | ICD-10-CM

## 2022-06-21 DIAGNOSIS — G8929 Other chronic pain: Secondary | ICD-10-CM | POA: Diagnosis not present

## 2022-06-21 DIAGNOSIS — N3281 Overactive bladder: Secondary | ICD-10-CM

## 2022-06-21 DIAGNOSIS — M25561 Pain in right knee: Secondary | ICD-10-CM | POA: Diagnosis not present

## 2022-06-21 DIAGNOSIS — M170 Bilateral primary osteoarthritis of knee: Secondary | ICD-10-CM

## 2022-06-21 DIAGNOSIS — I5032 Chronic diastolic (congestive) heart failure: Secondary | ICD-10-CM

## 2022-06-21 DIAGNOSIS — I10 Essential (primary) hypertension: Secondary | ICD-10-CM

## 2022-06-21 DIAGNOSIS — M25562 Pain in left knee: Secondary | ICD-10-CM | POA: Diagnosis not present

## 2022-06-21 DIAGNOSIS — F3342 Major depressive disorder, recurrent, in full remission: Secondary | ICD-10-CM

## 2022-06-21 DIAGNOSIS — E039 Hypothyroidism, unspecified: Secondary | ICD-10-CM

## 2022-06-21 MED ORDER — POTASSIUM CHLORIDE CRYS ER 20 MEQ PO TBCR: 20.0000 meq | EXTENDED_RELEASE_TABLET | Freq: Every day | ORAL | 1 refills | Status: DC

## 2022-06-21 MED ORDER — DILTIAZEM HCL ER BEADS 180 MG PO CP24: 180.0000 mg | ORAL_CAPSULE | Freq: Every day | ORAL | 1 refills | Status: DC

## 2022-06-21 MED ORDER — MIRABEGRON ER 50 MG PO TB24: 50.0000 mg | ORAL_TABLET | Freq: Every day | ORAL | 1 refills | Status: DC

## 2022-06-21 MED ORDER — LEVOTHYROXINE SODIUM 50 MCG PO TABS: 50.0000 ug | ORAL_TABLET | Freq: Every day | ORAL | 1 refills | Status: DC

## 2022-06-21 MED ORDER — CARVEDILOL 6.25 MG PO TABS: 6.2500 mg | ORAL_TABLET | Freq: Two times a day (BID) | ORAL | 1 refills | Status: DC

## 2022-06-21 MED ORDER — LOSARTAN POTASSIUM 100 MG PO TABS: 100.0000 mg | ORAL_TABLET | Freq: Every day | ORAL | 1 refills | Status: DC

## 2022-06-21 MED ORDER — FLUOXETINE HCL 40 MG PO CAPS: 40.0000 mg | ORAL_CAPSULE | Freq: Every day | ORAL | 1 refills | Status: DC

## 2022-06-21 MED ORDER — ROSUVASTATIN CALCIUM 10 MG PO TABS: 10.0000 mg | ORAL_TABLET | Freq: Every day | ORAL | 1 refills | Status: DC

## 2022-06-21 NOTE — Telephone Encounter (Signed)
Check benefits for Zilretta and visco

## 2022-06-21 NOTE — Patient Instructions (Addendum)
Thank you for coming in today.   We will work on the [employer].   Try to get an appointment up stairs as soon as possible about your cough.   I sent Dr Ronnald Ramp a message about medicine refills and Gastroenterology

## 2022-06-21 NOTE — Progress Notes (Signed)
   Shirlyn Goltz, PhD, LAT, ATC acting as a scribe for [name], MD.  [name] is a 82 y.o. female who presents to [place] at [facility] today for right knee pain and swelling.  Patient was last seen by Dr. Georgina Snell on 05/24/2022 and was given repeat bilateral knee steroid injections.  Today, patient reports both knees started hurting and swelling over the weekend, R>L. Pt notes her knees have just been throbbing w/ pain. She has been using Diclofenac.  Pt also c/o SOB and coughing ongoing for about 2 days. Pt had difficulty walking back to the exam room and needed to take several breaks and sit after being weight.  She notes that she needs refills of her chronic medications.  She is switching long-term [employer] to the [place].  She called the pharmacy requesting refills and has not heard back yet.  She wonders if it is an issue with her pharmacy or her primary care provider office.  She has not contacted her primary care provider office for the services yet.  Dx testing: 09/30/20 L-spine MRI  08/08/20 R & L knee XR, and L-spine XR             01/05/21 R hand XR             01/05/21 Labs  Pertinent review of systems: Positive for cough and congestion.  No fevers or chills.  Relevant historical information: Hypertension.  Diabetes.   Exam:  BP 102/78   Pulse (!) 57   Ht 4\' 11"  (1.499 m)   Wt 141 lb (64 kg)   SpO2 99%   BMI 28.48 kg/m  General: Well Developed, well nourished, and in no acute distress.   MSK: Knees bilaterally decreased range of motion.    Assessment and Plan: 82 y.o. female with bilateral knee pain due to exacerbation of DJD.  She was seen last about a month ago for this and had conventional steroid injections.  Next step should be Zilretta or hyaluronic acid injections.  Will work on authorization for both and return as soon as next week for those injections.  I am a little concerned about her cough.   Encouraged her to schedule an appointment with her primary care provider office.  I recommended that she walk directly upstairs from my office and talk to the front desk.  Sent a message to her PCP about medication refill request.  I am not sure what happened.  It sounds like there probably is some basic miscommunication about who to call when she needs a refill.  Happy to help facilitate.     Discussed warning signs or symptoms. Please see discharge instructions. Patient expresses understanding.   The above documentation has been reviewed and is accurate and complete [name], M.D.

## 2022-06-22 ENCOUNTER — Other Ambulatory Visit: Payer: Self-pay | Admitting: Internal Medicine

## 2022-06-22 DIAGNOSIS — F3342 Major depressive disorder, recurrent, in full remission: Secondary | ICD-10-CM

## 2022-06-25 ENCOUNTER — Ambulatory Visit: Payer: Self-pay

## 2022-06-25 ENCOUNTER — Ambulatory Visit (INDEPENDENT_AMBULATORY_CARE_PROVIDER_SITE_OTHER): Payer: Medicare HMO | Admitting: Internal Medicine

## 2022-06-25 ENCOUNTER — Encounter: Payer: Self-pay | Admitting: Internal Medicine

## 2022-06-25 ENCOUNTER — Ambulatory Visit (INDEPENDENT_AMBULATORY_CARE_PROVIDER_SITE_OTHER): Payer: Medicare HMO | Admitting: Family Medicine

## 2022-06-25 VITALS — BP 142/80 | HR 53 | Temp 98.0°F | Ht 59.0 in | Wt 143.0 lb

## 2022-06-25 DIAGNOSIS — G8929 Other chronic pain: Secondary | ICD-10-CM

## 2022-06-25 DIAGNOSIS — R051 Acute cough: Secondary | ICD-10-CM | POA: Diagnosis not present

## 2022-06-25 DIAGNOSIS — M170 Bilateral primary osteoarthritis of knee: Secondary | ICD-10-CM | POA: Diagnosis not present

## 2022-06-25 DIAGNOSIS — M25562 Pain in left knee: Secondary | ICD-10-CM

## 2022-06-25 DIAGNOSIS — M25561 Pain in right knee: Secondary | ICD-10-CM | POA: Diagnosis not present

## 2022-06-25 MED ORDER — AZITHROMYCIN 250 MG PO TABS: ORAL_TABLET | ORAL | 0 refills | Status: AC

## 2022-06-25 MED ORDER — TRIAMCINOLONE ACETONIDE 32 MG IX SRER
32.0000 mg | Freq: Once | INTRA_ARTICULAR | Status: AC
Start: 2022-06-25 — End: 2022-06-25
  Administered 2022-06-25: 32 mg via INTRA_ARTICULAR

## 2022-06-25 NOTE — Patient Instructions (Signed)
Thank you for coming in today.   You received an injection today. Seek immediate medical attention if the joint becomes red, extremely painful, or is oozing fluid.  

## 2022-06-25 NOTE — Progress Notes (Signed)
[name] presents to clinic today for Zilretta injection bilateral knees  Procedure: Real-time Ultrasound Guided Injection of right knee joint superior lateral patellar space Device: Philips Affiniti 50G Images permanently stored and available for review in PACS Verbal informed consent obtained.  Discussed risks and benefits of procedure. Warned about infection, bleeding, hyperglycemia damage to structures among others. Patient expresses understanding and agreement Time-out conducted.   Noted no overlying erythema, induration, or other signs of local infection.   Skin prepped in a sterile fashion.   Local anesthesia: Topical Ethyl chloride.   With sterile technique and under real time ultrasound guidance: Zilretta 32 mg injected into knee joint. Fluid seen entering the joint capsule.   Completed without difficulty   Advised to call if fevers/chills, erythema, induration, drainage, or persistent bleeding.   Images permanently stored and available for review in the ultrasound unit.  Impression: Technically successful ultrasound guided injection.    Procedure: Real-time Ultrasound Guided Injection of left knee joint superior lateral patellar space Device: Philips Affiniti 50G Images permanently stored and available for review in PACS Verbal informed consent obtained.  Discussed risks and benefits of procedure. Warned about infection, bleeding, hyperglycemia damage to structures among others. Patient expresses understanding and agreement Time-out conducted.   Noted no overlying erythema, induration, or other signs of local infection.   Skin prepped in a sterile fashion.   Local anesthesia: Topical Ethyl chloride.   With sterile technique and under real time ultrasound guidance: Zilretta 32 mg injected into knee joint. Fluid seen entering the joint capsule.   Completed without difficulty   Pain immediately resolved suggesting accurate placement of the medication.   Advised to call if  fevers/chills, erythema, induration, drainage, or persistent bleeding.   Images permanently stored and available for review in the ultrasound unit.  Impression: Technically successful ultrasound guided injection.    Lot number for both injections: [id number]

## 2022-06-25 NOTE — Telephone Encounter (Addendum)
Prior Auth required for Zilretta   Bilateral zilretta approved. 04/04/2022-07/04/2022.

## 2022-06-25 NOTE — Patient Instructions (Signed)
We have sent in azithromycin to take 2 pills today and then 1 pill daily until gone starting tomorrow.

## 2022-06-25 NOTE — Progress Notes (Unsigned)
   Subjective:   Patient ID: [name], female    DOB: 1940-07-07, 82 y.o.   MRN: [id number]  HPI The patient is an 82 YO female coming in for cough and SOB 5-6 days now. Some sinus symptoms. Did not test for covid-19. Overall worsening.   Review of Systems  Constitutional:  Positive for activity change and appetite change.  HENT:  Positive for congestion.   Eyes: Negative.   Respiratory:  Positive for cough and shortness of breath. Negative for chest tightness.   Cardiovascular:  Negative for chest pain, palpitations and leg swelling.  Gastrointestinal:  Negative for abdominal distention, abdominal pain, constipation, diarrhea, nausea and vomiting.  Musculoskeletal: Negative.   Skin: Negative.   Neurological: Negative.   Psychiatric/Behavioral: Negative.      Objective:  Physical Exam Constitutional:      Appearance: She is well-developed.  HENT:     Head: Normocephalic and atraumatic.  Cardiovascular:     Rate and Rhythm: Normal rate and regular rhythm.  Pulmonary:     Effort: Pulmonary effort is normal. No respiratory distress.     Breath sounds: Rhonchi present. No wheezing or rales.  Abdominal:     General: Bowel sounds are normal. There is no distension.     Palpations: Abdomen is soft.     Tenderness: There is no abdominal tenderness. There is no rebound.  Musculoskeletal:     Cervical back: Normal range of motion.  Skin:    General: Skin is warm and dry.  Neurological:     Mental Status: She is alert and oriented to person, place, and time.     Coordination: Coordination normal.     Vitals:   06/25/22 1434 06/25/22 1436  BP: (!) 142/80 (!) 142/80  Pulse: (!) 53   Temp: 98 F (36.7 C)   TempSrc: Oral   SpO2: 99%   Weight: 143 lb (64.9 kg)   Height: 4\' 11"  (1.499 m)     Assessment & Plan:

## 2022-06-26 ENCOUNTER — Telehealth: Payer: Self-pay

## 2022-06-26 NOTE — Telephone Encounter (Signed)
VOB initiated for Orthovisc for BILAT knee OA (Also checking Zilretta and Gelsyn)

## 2022-06-26 NOTE — Telephone Encounter (Signed)
Prior auth required for [employer]

## 2022-06-26 NOTE — Telephone Encounter (Signed)
VOB initiated for Gelsyn-3 for BILAT knee OA (Also checking Zilretta and Orthovisc)

## 2022-06-26 NOTE — Assessment & Plan Note (Signed)
With rhonchi on exam and worsening rx azithromycin course. Continue otc cough medicine.

## 2022-06-27 NOTE — Telephone Encounter (Signed)
Pre-determination recommended - [employer]

## 2022-06-27 NOTE — Telephone Encounter (Signed)
Check coverage of Durolane - per Dr. Denyse Amass.

## 2022-06-27 NOTE — Telephone Encounter (Signed)
     Reference ID(s): [id number] Type: Specialty Does the prescribing physician have the following documents? (Note: these documents do not have to be submitted at this time. However, they may be requested in the future for audit and authorization purposes.)  Medical record must include the medical history and physical examination that supports symptomatic osteoarthritis of the knee, and functional limitations. Radiographic confirmation of osteoarthritis of the knee in the form of an x-ray report and/or notation in the record must accompany the clinical description. The medical record must include documentation that supports that conservative therapy was attempted prior to viscosupplementation therapy. If conservative therapy were contraindicated or failed, the reason(s) must be supported in the documentation for review. The medical record must indicate whether one or both knees are being treated and in the former instance, which knee is being treated. The frequency of the injections and dosage given must be clearly indicated based on the FDA and must be clearly documented. The response to treatment must be noted. If ultrasound is used for needle guidance with the joint injection, the documentation must support that the patient's target site on the knee for needle placement may be difficult to access. The procedure and related care are within the scope of practice of the physician or appropriately trained provider's licensure.

## 2022-06-27 NOTE — Telephone Encounter (Signed)
Prior auth initiated for [employer] for BILAT knee OA via [employer] (medical buy and bill)   Authorization Number : [id number] Status: Tech Review

## 2022-06-28 ENCOUNTER — Telehealth: Payer: Self-pay | Admitting: Internal Medicine

## 2022-06-28 NOTE — Telephone Encounter (Signed)
Spoke with [facility] and okay to fill prescription for fluoxetine 40mg  capsules

## 2022-06-28 NOTE — Telephone Encounter (Signed)
Gelsyn-3 for BILAT knee OA (Also checking Orthovisc and Zilretta)  Primary Insurance: [employer]na Medicare Co-Pay: $15 Co-Insurance: 20% Deductible: does not apply  Prior Auth: APPROVED PA# [id number] Valid: 06/27/22-12/27/22

## 2022-06-28 NOTE — Telephone Encounter (Signed)
APPROVED  PA# [id number] Valid: 06/27/22-12/27/22

## 2022-06-28 NOTE — Telephone Encounter (Signed)
Yes, she has been taking this

## 2022-06-28 NOTE — Telephone Encounter (Signed)
[facility] called and said there was an allergy alert for  FLUoxetine (PROZAC) 40 MG capsule. They will put it on hold temporarily, but they would like a callback from PCP or nurse about what to do. Callback number is [phone], option 2. The reference number is [id number].

## 2022-06-29 NOTE — Telephone Encounter (Signed)
Zilretta was given on 06/25/22.

## 2022-06-29 NOTE — Telephone Encounter (Signed)
BILAT Zilretta inj 06/25/22 Can consider repeat September 18 2022

## 2022-06-29 NOTE — Telephone Encounter (Signed)
Gelsyn-3 authorized.   Pt received Zilretta inj 06/25/22

## 2022-07-21 DIAGNOSIS — E785 Hyperlipidemia, unspecified: Secondary | ICD-10-CM | POA: Diagnosis not present

## 2022-07-21 DIAGNOSIS — E876 Hypokalemia: Secondary | ICD-10-CM | POA: Diagnosis not present

## 2022-07-21 DIAGNOSIS — I509 Heart failure, unspecified: Secondary | ICD-10-CM | POA: Diagnosis not present

## 2022-07-21 DIAGNOSIS — R32 Unspecified urinary incontinence: Secondary | ICD-10-CM | POA: Diagnosis not present

## 2022-07-21 DIAGNOSIS — Z008 Encounter for other general examination: Secondary | ICD-10-CM | POA: Diagnosis not present

## 2022-07-21 DIAGNOSIS — M1990 Unspecified osteoarthritis, unspecified site: Secondary | ICD-10-CM | POA: Diagnosis not present

## 2022-07-21 DIAGNOSIS — E039 Hypothyroidism, unspecified: Secondary | ICD-10-CM | POA: Diagnosis not present

## 2022-07-21 DIAGNOSIS — F32A Depression, unspecified: Secondary | ICD-10-CM | POA: Diagnosis not present

## 2022-07-21 DIAGNOSIS — Z880 Allergy status to penicillin: Secondary | ICD-10-CM | POA: Diagnosis not present

## 2022-07-21 DIAGNOSIS — I130 Hypertensive heart and chronic kidney disease with heart failure and stage 1 through stage 4 chronic kidney disease, or unspecified chronic kidney disease: Secondary | ICD-10-CM | POA: Diagnosis not present

## 2022-07-21 DIAGNOSIS — F419 Anxiety disorder, unspecified: Secondary | ICD-10-CM | POA: Diagnosis not present

## 2022-07-21 DIAGNOSIS — E1122 Type 2 diabetes mellitus with diabetic chronic kidney disease: Secondary | ICD-10-CM | POA: Diagnosis not present

## 2022-07-21 DIAGNOSIS — E1151 Type 2 diabetes mellitus with diabetic peripheral angiopathy without gangrene: Secondary | ICD-10-CM | POA: Diagnosis not present

## 2022-08-02 ENCOUNTER — Ambulatory Visit: Payer: Medicare HMO | Admitting: Internal Medicine

## 2022-08-03 ENCOUNTER — Telehealth: Payer: Self-pay | Admitting: Internal Medicine

## 2022-08-03 NOTE — Telephone Encounter (Signed)
Prescription Request  08/03/2022  LOV: 05/29/2022  What is the name of the medication or equipment? clobetasol ointment (TEMOVATE) 0.05 %   Have you contacted your pharmacy to request a refill? No   Which pharmacy would you like this sent to?  [id number] [place], [place] - [address] [place] [address] Phone: [phone] Fax: [phone]   Patient notified that their request is being sent to the clinical staff for review and that they should receive a response within 2 business days.   Please advise at Mobile [phone] (mobile)

## 2022-08-05 ENCOUNTER — Other Ambulatory Visit: Payer: Self-pay | Admitting: Internal Medicine

## 2022-08-27 NOTE — Telephone Encounter (Signed)
Pr received Zilretta injections for BILAT knee OA 06/25/22.  Can consider repeat on or after 09/18/22

## 2022-08-28 NOTE — Telephone Encounter (Signed)
Prior auth required for ZILRETTA     

## 2022-08-29 NOTE — Telephone Encounter (Signed)
Prior Authorization initiated for [facility] via [employer] Case ID: [id number]

## 2022-08-30 ENCOUNTER — Ambulatory Visit: Payer: Medicare HMO | Admitting: Family Medicine

## 2022-08-30 ENCOUNTER — Encounter: Payer: Self-pay | Admitting: Internal Medicine

## 2022-08-30 ENCOUNTER — Ambulatory Visit (INDEPENDENT_AMBULATORY_CARE_PROVIDER_SITE_OTHER): Payer: Medicare Other | Admitting: Internal Medicine

## 2022-08-30 VITALS — BP 126/62 | HR 60 | Temp 98.0°F | Ht 59.0 in | Wt 143.0 lb

## 2022-08-30 DIAGNOSIS — I5032 Chronic diastolic (congestive) heart failure: Secondary | ICD-10-CM | POA: Diagnosis not present

## 2022-08-30 DIAGNOSIS — N3281 Overactive bladder: Secondary | ICD-10-CM

## 2022-08-30 DIAGNOSIS — E039 Hypothyroidism, unspecified: Secondary | ICD-10-CM | POA: Diagnosis not present

## 2022-08-30 DIAGNOSIS — F3342 Major depressive disorder, recurrent, in full remission: Secondary | ICD-10-CM | POA: Diagnosis not present

## 2022-08-30 DIAGNOSIS — E785 Hyperlipidemia, unspecified: Secondary | ICD-10-CM

## 2022-08-30 DIAGNOSIS — Z7984 Long term (current) use of oral hypoglycemic drugs: Secondary | ICD-10-CM

## 2022-08-30 DIAGNOSIS — E118 Type 2 diabetes mellitus with unspecified complications: Secondary | ICD-10-CM | POA: Diagnosis not present

## 2022-08-30 DIAGNOSIS — R0609 Other forms of dyspnea: Secondary | ICD-10-CM | POA: Diagnosis not present

## 2022-08-30 DIAGNOSIS — I10 Essential (primary) hypertension: Secondary | ICD-10-CM

## 2022-08-30 MED ORDER — ROSUVASTATIN CALCIUM 10 MG PO TABS: 10.0000 mg | ORAL_TABLET | Freq: Every day | ORAL | 0 refills | Status: DC

## 2022-08-30 MED ORDER — LOSARTAN POTASSIUM 100 MG PO TABS: 100.0000 mg | ORAL_TABLET | Freq: Every day | ORAL | 0 refills | Status: DC

## 2022-08-30 MED ORDER — FLUOXETINE HCL 40 MG PO CAPS: 40.0000 mg | ORAL_CAPSULE | Freq: Every day | ORAL | 0 refills | Status: DC

## 2022-08-30 MED ORDER — DILTIAZEM HCL ER BEADS 180 MG PO CP24: 180.0000 mg | ORAL_CAPSULE | Freq: Every day | ORAL | 0 refills | Status: DC

## 2022-08-30 MED ORDER — POTASSIUM CHLORIDE CRYS ER 20 MEQ PO TBCR
20.0000 meq | EXTENDED_RELEASE_TABLET | Freq: Every day | ORAL | 0 refills | Status: DC
Start: 2022-08-30 — End: 2022-12-13

## 2022-08-30 MED ORDER — EMPAGLIFLOZIN 10 MG PO TABS: 10.0000 mg | ORAL_TABLET | Freq: Every day | ORAL | 0 refills | Status: DC

## 2022-08-30 MED ORDER — MIRABEGRON ER 50 MG PO TB24: 50.0000 mg | ORAL_TABLET | Freq: Every day | ORAL | 0 refills | Status: DC

## 2022-08-30 MED ORDER — CARVEDILOL 3.125 MG PO TABS
3.1250 mg | ORAL_TABLET | Freq: Two times a day (BID) | ORAL | 0 refills | Status: DC
Start: 2022-08-30 — End: 2022-11-29

## 2022-08-30 MED ORDER — LEVOTHYROXINE SODIUM 50 MCG PO TABS: 50.0000 ug | ORAL_TABLET | Freq: Every day | ORAL | 0 refills | Status: DC

## 2022-08-30 NOTE — Patient Instructions (Signed)
Heart Failure, Diagnosis  Heart failure is a condition in which the heart has trouble pumping blood. This may mean that the heart cannot pump enough blood out to the body or that the heart does not fill up with enough blood. For some people with heart failure, fluid may back up into the lungs. There may also be swelling (edema) in the lower legs. Heart failure is usually a long-term (chronic) condition. It is important for you to take good care of yourself and follow the treatment plan from your health care provider. Different stages of heart failure have different treatment plans. The stages are: Stage A: At risk for heart failure. Having no symptoms of heart failure, but being at risk for developing heart failure. Stage B: Pre-heart failure. Having no symptoms of heart failure, but having structural changes to the heart that indicate heart failure. Stage C: Symptomatic heart failure. Having symptoms of heart failure in addition to structural changes to the heart that indicate heart failure. Stage D: Advanced heart failure. Having symptoms that interfere with daily life and frequent hospitalizations related to heart failure. What are the causes? This condition may be caused by: High blood pressure (hypertension). Hypertension causes the heart muscle to work harder than normal. Coronary artery disease, or CAD. CAD is the buildup of cholesterol and fat (plaque) in the arteries of the heart. Heart attack, also called myocardial infarction. This injures the heart muscle, making it hard for the heart to pump blood. Abnormal heart valves. The valves do not open and close properly, forcing the heart to pump harder to keep the blood flowing. Heart muscle disease, inflammation, or infection (cardiomyopathy or myocarditis). This is damage to the heart muscle. It can increase the risk of heart failure. Lung disease. The heart works harder when the lungs are not healthy. What increases the risk? The risk  of heart failure increases as a person ages. This condition is also more likely to develop in people who: Are obese. Use tobacco or nicotine products. Abuse alcohol or drugs. Have taken medicines that can damage the heart, such as chemotherapy drugs. Have any of these conditions: Diabetes. Abnormal heart rhythms. Thyroid problems. Low blood counts (anemia). Chronic kidney disease. Have a family history of heart failure. What are the signs or symptoms? Symptoms of this condition include: Shortness of breath with activity, such as when climbing stairs. A cough that does not go away. Swelling of the feet, ankles, legs, or abdomen. Losing or gaining weight for no reason. Trouble breathing when lying flat. Waking from sleep because of the need to sit up and get more air. Rapid heartbeat. Other symptoms may include: Tiredness (fatigue) and loss of energy. Feeling light-headed, dizzy, or close to fainting. Nausea or loss of appetite. Waking up more often during the night to urinate (nocturia). Confusion. How is this diagnosed? This condition is diagnosed based on: Your medical history, symptoms, and a physical exam. Blood tests. Diagnostic tests, which may include: Echocardiogram. Electrocardiogram (ECG). Chest X-ray. Exercise stress test. Cardiac MRI. Cardiac catheterization and angiogram. Radionuclide scans. How is this treated? Treatment for this condition is aimed at managing the symptoms of heart failure. Medicines Treatment may include medicines that: Help lower blood pressure by relaxing (dilating) the blood vessels. These medicines are called ACE inhibitors (angiotensin-converting enzyme), ARBs (angiotensin receptor blockers), or vasodilators. Cause the kidneys to remove salt and water from the blood through urination (diuretics). Improve heart muscle strength and prevent the heart from beating too fast (beta blockers). Increase the  force of the heartbeat  (digoxin). Lower heart rates. Certain diabetes medicines (SGLT-2 inhibitors) may also be used in treatment. Healthy behavior changes Treatment may also include making healthy lifestyle changes, such as: Reaching and staying at a healthy weight. Not using tobacco or nicotine products. Eating heart-healthy foods. Limiting or avoiding alcohol. Stopping the use of illegal drugs. Being physically active. Participating in a cardiac rehabilitation program, which is a treatment program to improve your health and well-being through exercise training, education, and counseling. Other treatments Other treatments may include: Procedures to open blocked arteries or repair damaged valves. Placing a pacemaker to improve heart function (cardiac resynchronization therapy). Placing a device to treat serious abnormal heart rhythms (implantable cardioverter defibrillator, or ICD). Placing a device to improve the pumping ability of the heart (left ventricular assist device, or LVAD). Receiving a healthy heart from a donor (heart transplant). This is done when other treatments have not helped. Follow these instructions at home: Manage other health conditions as told by your health care provider. These may include hypertension, diabetes, thyroid disease, or abnormal heart rhythms. Get ongoing education and support as needed. Learn as much as you can about heart failure. Keep all follow-up visits. This is important. Where to find more information American Heart Association: www.heart.org Centers for Disease Control and Prevention: FootballExhibition.com.br NIH [employer] on Aging: https://walker.com/ Summary Heart failure is a condition in which the heart has trouble pumping blood. This condition is commonly caused by high blood pressure and other diseases of the heart and lungs. Symptoms of this condition include shortness of breath, tiredness (fatigue), nausea, and swelling of the feet, ankles, legs, or  abdomen. Treatments for this condition may include medicines, lifestyle changes, and surgery. Manage other health conditions as told by your health care provider. This information is not intended to replace advice given to you by your health care provider. Make sure you discuss any questions you have with your health care provider. Document Revised: 06/13/2021 Document Reviewed: 09/26/2019 Elsevier Patient Education  2024 [employer].

## 2022-08-30 NOTE — Progress Notes (Unsigned)
Subjective:  Patient ID: [name], female    DOB: 01-18-1941  Age: 82 y.o. MRN: [id number]  CC: Hypertension, Congestive Heart Failure, Diabetes, and Hypothyroidism   HPI [name] presents for f/up ---  She complains that her DOE has worsened and she gets SOB after taking a few steps.  Outpatient Medications Prior to Visit  Medication Sig Dispense Refill   AMBULATORY NON FORMULARY MEDICATION Folding walker with front wheels.  Use as needed. Disp 1 R26.89 1 each 0   AMBULATORY NON FORMULARY MEDICATION Folding walker with front wheels. Dispense 1 Dx code: R26.89 Use as needed 1 Device 0   aspirin EC 325 MG tablet Take 1 tablet (325 mg total) by mouth daily. 30 tablet 0   Blood Glucose Calibration (ACCU-CHEK GUIDE CONTROL) LIQD 1 Act by In Vitro route 2 (two) times daily as needed. 1 each 3   Blood Glucose Monitoring Suppl (ACCU-CHEK GUIDE ME) w/Device KIT 1 Act by Does not apply route 2 (two) times daily as needed. 2 kit 2   clobetasol ointment (TEMOVATE) 0.05 % Apply 1 Application topically 2 (two) times daily.     diclofenac Sodium (VOLTAREN) 1 % GEL Apply 4 g topically 4 (four) times daily. 100 g 5   glucose blood (ACCU-CHEK GUIDE) test strip 1 each by Other route 2 (two) times daily. 300 each 1   PRED FORTE 1 % ophthalmic suspension      carvedilol (COREG) 6.25 MG tablet Take 1 tablet (6.25 mg total) by mouth 2 (two) times daily. 180 tablet 1   diltiazem (TIADYLT ER) 180 MG 24 hr capsule Take 1 capsule (180 mg total) by mouth daily. 100 capsule 1   empagliflozin (JARDIANCE) 10 MG TABS tablet Take 1 tablet (10 mg total) by mouth daily before breakfast. 90 tablet 1   FLUoxetine (PROZAC) 40 MG capsule TAKE 1 CAPSULE DAILY. 90 capsule 1   levothyroxine (SYNTHROID) 50 MCG tablet Take 1 tablet (50 mcg total) by mouth daily. 100 tablet 1   losartan (COZAAR) 100 MG tablet Take 1 tablet (100 mg total) by mouth daily. 90 tablet 1   mirabegron ER (MYRBETRIQ) 50 MG TB24 tablet  Take 1 tablet (50 mg total) by mouth daily. 100 tablet 1   potassium chloride SA (KLOR-CON M) 20 MEQ tablet Take 1 tablet (20 mEq total) by mouth daily. 90 tablet 1   rosuvastatin (CRESTOR) 10 MG tablet Take 1 tablet (10 mg total) by mouth daily. 100 tablet 1   No facility-administered medications prior to visit.    ROS Review of Systems  Constitutional:  Negative for appetite change, diaphoresis, fatigue and unexpected weight change.  HENT: Negative.    Eyes:  Negative for visual disturbance.  Respiratory:  Positive for shortness of breath. Negative for cough, chest tightness and wheezing.   Cardiovascular:  Negative for chest pain, palpitations and leg swelling.  Gastrointestinal:  Negative for abdominal pain, constipation, diarrhea, nausea and vomiting.  Endocrine: Negative.   Genitourinary: Negative.  Negative for difficulty urinating.  Musculoskeletal:  Positive for arthralgias and gait problem. Negative for myalgias.  Skin: Negative.   Neurological:  Positive for dizziness and light-headedness.  Hematological: Negative.  Negative for adenopathy.  Psychiatric/Behavioral: Negative.      Objective:  BP 126/62 (BP Location: Left Arm, Patient Position: Sitting, Cuff Size: Large)   Pulse 60   Temp 98 F (36.7 C) (Oral)   Ht 4\' 11"  (1.499 m)   Wt 143 lb (64.9 kg)   SpO2 94%  BMI 28.88 kg/m   BP Readings from Last 3 Encounters:  08/30/22 126/62  06/25/22 (!) 142/80  06/21/22 102/78    Wt Readings from Last 3 Encounters:  08/30/22 143 lb (64.9 kg)  06/25/22 143 lb (64.9 kg)  06/21/22 141 lb (64 kg)    Physical Exam Vitals reviewed.  HENT:     Mouth/Throat:     Mouth: Mucous membranes are moist.  Eyes:     General: No scleral icterus.    Conjunctiva/sclera: Conjunctivae normal.  Cardiovascular:     Rate and Rhythm: Regular rhythm. Bradycardia present.     Heart sounds: Normal heart sounds, S1 normal and S2 normal. No murmur heard.    Comments: EKG- SB, 57  bpm Moderate LVH Lateral infarct pattern is new No Q waves or acute ST/T wave changes Pulmonary:     Effort: Pulmonary effort is normal.     Breath sounds: No stridor. No rhonchi or rales.  Abdominal:     General: Abdomen is flat.     Palpations: There is no mass.     Tenderness: There is no abdominal tenderness. There is no guarding.     Hernia: No hernia is present.  Musculoskeletal:     Right lower leg: No edema.     Left lower leg: No edema.  Skin:    General: Skin is warm and dry.     Capillary Refill: Capillary refill takes less than 2 seconds.  Neurological:     General: No focal deficit present.     Mental Status: She is alert.  Psychiatric:        Mood and Affect: Mood normal.        Behavior: Behavior normal.     Lab Results  Component Value Date   WBC 9.6 05/29/2022   HGB 14.0 05/29/2022   HCT 42.1 05/29/2022   PLT 232.0 05/29/2022   GLUCOSE 99 05/29/2022   CHOL 150 09/14/2021   TRIG 95.0 09/14/2021   HDL 72.00 09/14/2021   LDLDIRECT 120.6 11/24/2012   LDLCALC 59 09/14/2021   ALT 24 09/14/2021   AST 28 09/14/2021   NA 135 05/29/2022   K 4.0 05/29/2022   CL 107 05/29/2022   CREATININE 0.73 05/29/2022   BUN 22 05/29/2022   CO2 20 05/29/2022   TSH 2.20 05/29/2022   INR 1.0 11/20/2021   HGBA1C 5.9 05/29/2022   MICROALBUR 1.7 02/15/2022    MM DIAG BREAST TOMO BILATERAL  Result Date: 02/01/2022 CLINICAL DATA:  82 year old female with bilateral breast discomfort. In addition, the patient has had left breast surgery approximately 30 years ago for cyst removal (patient states surgeon went directly through her left nipple) and she would like to make sure her site of prior breast surgery is stable. EXAM: DIGITAL DIAGNOSTIC BILATERAL MAMMOGRAM WITH TOMOSYNTHESIS; ULTRASOUND LEFT BREAST LIMITED TECHNIQUE: Bilateral digital diagnostic mammography and breast tomosynthesis was performed.; Targeted ultrasound examination of the left breast was performed. COMPARISON:   Previous exam(s). ACR Breast Density Category c: The breast tissue is heterogeneously dense, which may obscure small masses. FINDINGS: No suspicious masses or calcifications are seen in either breast. Postsurgical appearance of the left breast is felt to be stable dating back to prior mammograms from 2007. There is no mammographic evidence of malignancy in either breast. Targeted ultrasound at the site of most tenderness in the outer left breast was performed. No suspicious masses or abnormality seen, only normal-appearing fibroglandular tissue identified. In addition, sonographic evaluation of patient's prior site of surgery  was performed demonstrating only expected postsurgical change with no suspicious masses or any other worrisome abnormalities identified. IMPRESSION: No findings of malignancy in either breast. RECOMMENDATION: 1. Recommend further management of bilateral breast tender be based on clinical assessment. 2.  Screening mammogram in one year.(Code:SM-B-01Y) I have discussed the findings and recommendations with the patient. If applicable, a reminder letter will be sent to the patient regarding the next appointment. BI-RADS CATEGORY  1: Negative. Electronically Signed   By: Edwin Cap M.D.   On: 02/01/2022 12:40   US BREAST LTD UNI LEFT INC AXILLA  Result Date: 02/01/2022 CLINICAL DATA:  82 year old female with bilateral breast discomfort. In addition, the patient has had left breast surgery approximately 30 years ago for cyst removal (patient states surgeon went directly through her left nipple) and she would like to make sure her site of prior breast surgery is stable. EXAM: DIGITAL DIAGNOSTIC BILATERAL MAMMOGRAM WITH TOMOSYNTHESIS; ULTRASOUND LEFT BREAST LIMITED TECHNIQUE: Bilateral digital diagnostic mammography and breast tomosynthesis was performed.; Targeted ultrasound examination of the left breast was performed. COMPARISON:  Previous exam(s). ACR Breast Density Category c: The  breast tissue is heterogeneously dense, which may obscure small masses. FINDINGS: No suspicious masses or calcifications are seen in either breast. Postsurgical appearance of the left breast is felt to be stable dating back to prior mammograms from 2007. There is no mammographic evidence of malignancy in either breast. Targeted ultrasound at the site of most tenderness in the outer left breast was performed. No suspicious masses or abnormality seen, only normal-appearing fibroglandular tissue identified. In addition, sonographic evaluation of patient's prior site of surgery was performed demonstrating only expected postsurgical change with no suspicious masses or any other worrisome abnormalities identified. IMPRESSION: No findings of malignancy in either breast. RECOMMENDATION: 1. Recommend further management of bilateral breast tender be based on clinical assessment. 2.  Screening mammogram in one year.(Code:SM-B-01Y) I have discussed the findings and recommendations with the patient. If applicable, a reminder letter will be sent to the patient regarding the next appointment. BI-RADS CATEGORY  1: Negative. Electronically Signed   By: Edwin Cap M.D.   On: 02/01/2022 12:40   Assessment & Plan:   DOE (dyspnea on exertion) -     CBC with Differential/Platelet; Future -     Troponin I (High Sensitivity); Future -     Brain natriuretic peptide; Future  Recurrent major depressive disorder, in full remission (HCC) -     FLUoxetine HCl; Take 1 capsule (40 mg total) by mouth daily.  Dispense: 90 capsule; Refill: 0  Chronic heart failure with preserved ejection fraction (HCC) -     Empagliflozin; Take 1 tablet (10 mg total) by mouth daily before breakfast.  Dispense: 90 tablet; Refill: 0 -     Losartan Potassium; Take 1 tablet (100 mg total) by mouth daily.  Dispense: 90 tablet; Refill: 0 -     EKG 12-Lead -     Basic metabolic panel; Future -     Carvedilol; Take 1 tablet (3.125 mg total) by mouth 2  (two) times daily with a meal.  Dispense: 180 tablet; Refill: 0 -     dilTIAZem HCl ER Beads; Take 1 capsule (180 mg total) by mouth daily.  Dispense: 100 capsule; Refill: 0  Essential hypertension -     Losartan Potassium; Take 1 tablet (100 mg total) by mouth daily.  Dispense: 90 tablet; Refill: 0 -     Potassium Chloride Crys ER; Take 1 tablet (20 mEq total)  by mouth daily.  Dispense: 90 tablet; Refill: 0 -     EKG 12-Lead -     CBC with Differential/Platelet; Future -     Basic metabolic panel; Future -     Carvedilol; Take 1 tablet (3.125 mg total) by mouth 2 (two) times daily with a meal.  Dispense: 180 tablet; Refill: 0 -     dilTIAZem HCl ER Beads; Take 1 capsule (180 mg total) by mouth daily.  Dispense: 100 capsule; Refill: 0  Type II diabetes mellitus with manifestations (HCC) -     Losartan Potassium; Take 1 tablet (100 mg total) by mouth daily.  Dispense: 90 tablet; Refill: 0 -     Hemoglobin A1c; Future -     Basic metabolic panel; Future  Acquired hypothyroidism -     Levothyroxine Sodium; Take 1 tablet (50 mcg total) by mouth daily.  Dispense: 100 tablet; Refill: 0  OAB (overactive bladder) -     Mirabegron ER; Take 1 tablet (50 mg total) by mouth daily.  Dispense: 100 tablet; Refill: 0  Hyperlipidemia with target LDL less than 100 -     Rosuvastatin Calcium; Take 1 tablet (10 mg total) by mouth daily.  Dispense: 100 tablet; Refill: 0     Follow-up: Return in about 3 months (around 11/30/2022).  Sanda Linger, MD

## 2022-08-30 NOTE — Progress Notes (Deleted)
   Rubin Payor, PhD, LAT, ATC acting as a scribe for Clementeen Graham, MD.  [name] is a 82 y.o. female who presents to [facility] at [facility] today for cont'd bilat knee pain. Pt was last seen by Dr. Denyse Amass on 06/25/22 and was given bilat Zilretta injections.   Today, pt reports ***  Dx testing: 09/30/20 L-spine MRI  08/08/20 R & L knee XR, and L-spine XR             01/05/21 R hand XR             01/05/21 Labs  Pertinent review of systems: ***  Relevant historical information: ***   Exam:  There were no vitals taken for this visit. General: Well Developed, well nourished, and in no acute distress.   MSK: ***    Lab and Radiology Results No results found for this or any previous visit (from the past 72 hour(s)). No results found.     Assessment and Plan: 82 y.o. female with ***   PDMP not reviewed this encounter. No orders of the defined types were placed in this encounter.  No orders of the defined types were placed in this encounter.    Discussed warning signs or symptoms. Please see discharge instructions. Patient expresses understanding.   ***

## 2022-09-03 ENCOUNTER — Other Ambulatory Visit (INDEPENDENT_AMBULATORY_CARE_PROVIDER_SITE_OTHER): Payer: Medicare Other

## 2022-09-03 DIAGNOSIS — E118 Type 2 diabetes mellitus with unspecified complications: Secondary | ICD-10-CM

## 2022-09-03 DIAGNOSIS — I5032 Chronic diastolic (congestive) heart failure: Secondary | ICD-10-CM | POA: Diagnosis not present

## 2022-09-03 DIAGNOSIS — I10 Essential (primary) hypertension: Secondary | ICD-10-CM | POA: Diagnosis not present

## 2022-09-03 DIAGNOSIS — R0609 Other forms of dyspnea: Secondary | ICD-10-CM | POA: Diagnosis not present

## 2022-09-03 LAB — BASIC METABOLIC PANEL
BUN: 14 mg/dL (ref 6–23)
CO2: 23 mEq/L (ref 19–32)
Calcium: 9.1 mg/dL (ref 8.4–10.5)
Chloride: 108 mEq/L (ref 96–112)
Creatinine, Ser: 0.91 mg/dL (ref 0.40–1.20)
GFR: 58.45 mL/min — ABNORMAL LOW (ref >60.00)
Glucose, Bld: 95 mg/dL (ref 70–99)
Potassium: 4 mEq/L (ref 3.5–5.1)
Sodium: 140 mEq/L (ref 135–145)

## 2022-09-03 LAB — CBC WITH DIFFERENTIAL/PLATELET
Basophils Absolute: 0.1 10*3/uL (ref 0.0–0.1)
Basophils Relative: 1.3 % (ref 0.0–3.0)
Eosinophils Absolute: 0.1 10*3/uL (ref 0.0–0.7)
Eosinophils Relative: 1.3 % (ref 0.0–5.0)
HCT: 39 % (ref 36.0–46.0)
Hemoglobin: 12.5 g/dL (ref 12.0–15.0)
Lymphocytes Relative: 28.6 % (ref 12.0–46.0)
Lymphs Abs: 2.1 10*3/uL (ref 0.7–4.0)
MCHC: 32.2 g/dL (ref 30.0–36.0)
MCV: 94.9 fl (ref 78.0–100.0)
Monocytes Absolute: 0.7 10*3/uL (ref 0.1–1.0)
Monocytes Relative: 9.5 % (ref 3.0–12.0)
Neutro Abs: 4.4 10*3/uL (ref 1.4–7.7)
Neutrophils Relative %: 59.3 % (ref 43.0–77.0)
Platelets: 252 10*3/uL (ref 150.0–400.0)
RBC: 4.11 Mil/uL (ref 3.87–5.11)
RDW: 15.5 % (ref 11.5–15.5)
WBC: 7.4 10*3/uL (ref 4.0–10.5)

## 2022-09-03 LAB — HEMOGLOBIN A1C: Hgb A1c MFr Bld: 6.3 % (ref 4.6–6.5)

## 2022-09-03 LAB — BRAIN NATRIURETIC PEPTIDE: Pro B Natriuretic peptide (BNP): 180 pg/mL — ABNORMAL HIGH (ref 0.0–100.0)

## 2022-09-03 LAB — TROPONIN I (HIGH SENSITIVITY): High Sens Troponin I: 4 ng/L (ref 2–17)

## 2022-09-03 NOTE — Telephone Encounter (Signed)
Coverage for Zilretta injections has been DENIED

## 2022-09-04 NOTE — Telephone Encounter (Signed)
Coverage for Zilretta injection(s) DENIED  "Neither Medicare nor your Medicare Advantage plan covers this drug for your condition at the frequency your doctor has prescribed. Medical studies have not proven that this drug frequency (repeat injection) is safe and helpful for treatment of your health problem" 

## 2022-09-10 ENCOUNTER — Other Ambulatory Visit: Payer: Self-pay | Admitting: Internal Medicine

## 2022-09-10 ENCOUNTER — Telehealth: Payer: Self-pay | Admitting: Internal Medicine

## 2022-09-10 DIAGNOSIS — L2083 Infantile (acute) (chronic) eczema: Secondary | ICD-10-CM

## 2022-09-10 MED ORDER — CLOBETASOL PROPIONATE 0.05 % EX OINT: 1.0000 | TOPICAL_OINTMENT | Freq: Two times a day (BID) | CUTANEOUS | 1 refills | Status: DC

## 2022-09-10 NOTE — Telephone Encounter (Signed)
RX sent

## 2022-09-10 NOTE — Telephone Encounter (Signed)
Called pt, LVM stating refills were sent.

## 2022-09-10 NOTE — Telephone Encounter (Signed)
Patient needs a refill on cholebetasol ointment.  Please send to [facility].  Please call patient at:  [phone]  when this is done.  Patient also needs nementine ER 14 mg. And jardiance.    - This should go to [facility] also

## 2022-09-12 NOTE — Telephone Encounter (Signed)
She is NOT allergic to this

## 2022-09-13 NOTE — Telephone Encounter (Signed)
Scheduled and supply in stock

## 2022-09-13 NOTE — Telephone Encounter (Signed)
Spoke with patient, she would like to proceed with Gelsyn injections. Appt scheduled for 09/17/22, she will call back if she needs to r/s. Forwarding to [place] for ordering and to [employer] as FYI.    Gelsyn-3 for BILAT knee OA (Also checking Orthovisc and Zilretta)   Primary Insurance: [employer]na Medicare Co-Pay: $15 Co-Insurance: 20% Deductible: does not apply   Prior Auth: APPROVED PA# [id number] Valid: 06/27/22-12/27/22

## 2022-09-13 NOTE — Telephone Encounter (Signed)
Pt scheduled for 09/18/22 but insurance will not cover repeat Zilretta injections.   Unk Lightning has been approved.   I will reach out to pt to see if she would prefer 3 series Gelsyn or single Durolane inj.

## 2022-09-13 NOTE — Telephone Encounter (Signed)
Called pt and left VM to call the office.   Need to find out if pt would like to try gel/visco inj since her insurance has denied coverage for repeat Zilretta injections.

## 2022-09-17 ENCOUNTER — Ambulatory Visit (INDEPENDENT_AMBULATORY_CARE_PROVIDER_SITE_OTHER): Payer: Medicare Other | Admitting: Family Medicine

## 2022-09-17 ENCOUNTER — Other Ambulatory Visit: Payer: Self-pay

## 2022-09-17 ENCOUNTER — Other Ambulatory Visit: Payer: Self-pay | Admitting: Internal Medicine

## 2022-09-17 ENCOUNTER — Telehealth: Payer: Self-pay | Admitting: Internal Medicine

## 2022-09-17 VITALS — BP 126/62 | Ht 59.0 in | Wt 146.0 lb

## 2022-09-17 DIAGNOSIS — M170 Bilateral primary osteoarthritis of knee: Secondary | ICD-10-CM

## 2022-09-17 DIAGNOSIS — M25561 Pain in right knee: Secondary | ICD-10-CM

## 2022-09-17 DIAGNOSIS — G8929 Other chronic pain: Secondary | ICD-10-CM

## 2022-09-17 DIAGNOSIS — M25562 Pain in left knee: Secondary | ICD-10-CM

## 2022-09-17 MED ORDER — SODIUM HYALURONATE (VISCOSUP) 16.8 MG/2ML IX SOSY
16.8000 mg | PREFILLED_SYRINGE | Freq: Once | INTRA_ARTICULAR | Status: AC
Start: 2022-09-17 — End: 2022-09-17
  Administered 2022-09-17: 16.8 mg via INTRA_ARTICULAR

## 2022-09-17 NOTE — Patient Instructions (Addendum)
Thank you for coming in today.    You received an injection today. Seek immediate medical attention if the joint becomes red, extremely painful, or is oozing fluid.   Schedule the second gel shot for next week and the 3rd for the week after that.  Let me know if you need anything.

## 2022-09-17 NOTE — Progress Notes (Signed)
Gina Payor, PhD, LAT, ATC acting as a scribe for Gina Graham, MD.  [name] is a 82 y.o. female who presents to [facility] at [facility] today for bilateral knee pain. [name] has a long history of chronic bilateral knee pain.  She has had repeated steroid injections and most recently had bilateral Zilretta injections on April 8 of this year. Today, pt reports bilat knee pain returning over the last few weeks. She is wanting to try the gel shots to see if they provide lasting relief.    Pertinent review of systems: No fevers or chills  Relevant historical information: Heart disease.  Late onset Alzheimer's.   Exam:  BP 126/62   Ht 4\' 11"  (1.499 m)   Wt 146 lb (66.2 kg)   BMI 29.49 kg/m  General: Well Developed, well nourished, and in no acute distress.   MSK: Knees bilaterally mild effusion normal-appearing otherwise normal motion.    Lab and Radiology Results  [name] presents to clinic today for Gelsyn injection bilateral knee 1/3 Procedure: Real-time Ultrasound Guided Injection of right knee joint superior lateral patellar space Device: Philips Affiniti 50G/GE Logiq Images permanently stored and available for review in PACS Verbal informed consent obtained.  Discussed risks and benefits of procedure. Warned about infection, bleeding, damage to structures among others. Patient expresses understanding and agreement Time-out conducted.   Noted no overlying erythema, induration, or other signs of local infection.   Skin prepped in a sterile fashion.   Local anesthesia: Topical Ethyl chloride.   With sterile technique and under real time ultrasound guidance: Gelsyn 2 mL injected into knee joint. Fluid seen entering the joint capsule.   Completed without difficulty   Advised to call if fevers/chills, erythema, induration, drainage, or persistent bleeding.   Images permanently stored and available for review in the ultrasound unit.   Impression: Technically successful ultrasound guided injection.   Procedure: Real-time Ultrasound Guided Injection of left knee joint superior lateral patellar space Device: Philips Affiniti 50G/GE Logiq Images permanently stored and available for review in PACS Verbal informed consent obtained.  Discussed risks and benefits of procedure. Warned about infection, bleeding, damage to structures among others. Patient expresses understanding and agreement Time-out conducted.   Noted no overlying erythema, induration, or other signs of local infection.   Skin prepped in a sterile fashion.   Local anesthesia: Topical Ethyl chloride.   With sterile technique and under real time ultrasound guidance: Gelsyn 2 mL injected into knee joint. Fluid seen entering the joint capsule.   Completed without difficulty   Advised to call if fevers/chills, erythema, induration, drainage, or persistent bleeding.   Images permanently stored and available for review in the ultrasound unit.  Impression: Technically successful ultrasound guided injection. Lot number: [id number] both injections      Assessment and Plan: 82 y.o. female with bilateral knee pain due to DJD.  Plan for bilateral Gelsyn injection series starting today.  Return next week for Gelsyn injection bilateral knees 2/3.   PDMP not reviewed this encounter. Orders Placed This Encounter  Procedures   [place] LIMITED JOINT SPACE STRUCTURES LOW BILAT(NO LINKED CHARGES)    Order Specific Question:   Reason for Exam (SYMPTOM  OR DIAGNOSIS REQUIRED)    Answer:   bilateral knee pain    Order Specific Question:   Preferred imaging location?    Answer:   Adult nurse [facility] ordered this encounter  Medications   sodium hyaluronate (viscosup) (GELSYN-3) intra-articular  injection 16.8 mg   sodium hyaluronate (viscosup) (GELSYN-3) intra-articular injection 16.8 mg     Discussed warning signs or symptoms. Please see discharge  instructions. Patient expresses understanding.   The above documentation has been reviewed and is accurate and complete [name], M.D.

## 2022-09-17 NOTE — Telephone Encounter (Signed)
Pt states she has not received her JARDIANCE RX from [facility] and is asking about an RX for Memantine. Could not find any records of the Memantine.   Please re-send RX for [facility]

## 2022-09-18 ENCOUNTER — Other Ambulatory Visit: Payer: Self-pay | Admitting: Internal Medicine

## 2022-09-18 ENCOUNTER — Ambulatory Visit: Payer: Medicare Other | Admitting: Family Medicine

## 2022-09-18 DIAGNOSIS — G301 Alzheimer's disease with late onset: Secondary | ICD-10-CM

## 2022-09-18 MED ORDER — MEMANTINE HCL ER 28 MG PO CP24
28.0000 mg | ORAL_CAPSULE | Freq: Every day | ORAL | 1 refills | Status: DC
Start: 2022-09-18 — End: 2022-09-25

## 2022-09-24 ENCOUNTER — Telehealth: Payer: Self-pay | Admitting: Internal Medicine

## 2022-09-24 DIAGNOSIS — G301 Alzheimer's disease with late onset: Secondary | ICD-10-CM

## 2022-09-24 DIAGNOSIS — I5032 Chronic diastolic (congestive) heart failure: Secondary | ICD-10-CM

## 2022-09-24 NOTE — Telephone Encounter (Signed)
Receiving Gelsyn-3 injections.

## 2022-09-24 NOTE — Telephone Encounter (Signed)
Pt called wanting her medicine for empagliflozin (JARDIANCE) 10 MG TABS tablet not sure what she was asking for she said she wanted to speak with you because she you know what she is talking about. Please advise.

## 2022-09-25 ENCOUNTER — Ambulatory Visit (INDEPENDENT_AMBULATORY_CARE_PROVIDER_SITE_OTHER): Payer: Medicare Other | Admitting: Family Medicine

## 2022-09-25 ENCOUNTER — Other Ambulatory Visit: Payer: Self-pay

## 2022-09-25 DIAGNOSIS — G8929 Other chronic pain: Secondary | ICD-10-CM

## 2022-09-25 DIAGNOSIS — M170 Bilateral primary osteoarthritis of knee: Secondary | ICD-10-CM | POA: Diagnosis not present

## 2022-09-25 DIAGNOSIS — M25562 Pain in left knee: Secondary | ICD-10-CM | POA: Diagnosis not present

## 2022-09-25 DIAGNOSIS — M25561 Pain in right knee: Secondary | ICD-10-CM | POA: Diagnosis not present

## 2022-09-25 MED ORDER — SODIUM HYALURONATE (VISCOSUP) 16.8 MG/2ML IX SOSY
16.80 mg | PREFILLED_SYRINGE | Freq: Once | INTRA_ARTICULAR | Status: AC
Start: 2022-09-25 — End: 2022-09-25
  Administered 2022-09-25: 16.8 mg via INTRA_ARTICULAR

## 2022-09-25 MED ORDER — SODIUM HYALURONATE (VISCOSUP) 16.8 MG/2ML IX SOSY
16.8000 mg | PREFILLED_SYRINGE | Freq: Once | INTRA_ARTICULAR | Status: AC
Start: 2022-09-25 — End: 2022-09-25
  Administered 2022-09-25: 16.8 mg via INTRA_ARTICULAR

## 2022-09-25 MED ORDER — EMPAGLIFLOZIN 10 MG PO TABS
10.0000 mg | ORAL_TABLET | Freq: Every day | ORAL | 0 refills | Status: DC
Start: 2022-09-25 — End: 2022-11-29

## 2022-09-25 MED ORDER — MEMANTINE HCL ER 28 MG PO CP24
28.0000 mg | ORAL_CAPSULE | Freq: Every day | ORAL | 1 refills | Status: DC
Start: 2022-09-25 — End: 2023-01-16

## 2022-09-25 NOTE — Patient Instructions (Addendum)
Thank you for coming in today.   You received an injection today. Seek immediate medical attention if the joint becomes red, extremely painful, or is oozing fluid.   We will see you next week for the 3rd Gelsyn injections   

## 2022-09-25 NOTE — Telephone Encounter (Signed)
Pt stated that Jardiance and Namenda should have went to [facility].   I have deleted [facility] mail order and I have sent the Rx's to the correct pharmacy on file.

## 2022-09-25 NOTE — Progress Notes (Signed)
Galylea Vega presents to clinic today for  Gesyn injection BL knee 2/3 Procedure: Real-time Ultrasound Guided Injection of right knee joint superior lateral patellar space Device: Philips Affiniti 50G/GE Logiq Images permanently stored and available for review in PACS Verbal informed consent obtained.  Discussed risks and benefits of procedure. Warned about infection, bleeding, damage to structures among others. Patient expresses understanding and agreement Time-out conducted.   Noted no overlying erythema, induration, or other signs of local infection.   Skin prepped in a sterile fashion.   Local anesthesia: Topical Ethyl chloride.   With sterile technique and under real time ultrasound guidance: Gelsyn 2 mL injected into knee joint. Fluid seen entering the joint capsule.   Completed without difficulty   Advised to call if fevers/chills, erythema, induration, drainage, or persistent bleeding.   Images permanently stored and available for review in the ultrasound unit.  Impression: Technically successful ultrasound guided injection.   Procedure: Real-time Ultrasound Guided Injection of left knee joint superior lateral patellar space Device: Philips Affiniti 50G/GE Logiq Images permanently stored and available for review in PACS Verbal informed consent obtained.  Discussed risks and benefits of procedure. Warned about infection, bleeding, damage to structures among others. Patient expresses understanding and agreement Time-out conducted.   Noted no overlying erythema, induration, or other signs of local infection.   Skin prepped in a sterile fashion.   Local anesthesia: Topical Ethyl chloride.   With sterile technique and under real time ultrasound guidance: Gelsyn 2 mL injected into knee joint. Fluid seen entering the joint capsule.   Completed without difficulty   Advised to call if fevers/chills, erythema, induration, drainage, or persistent bleeding.   Images permanently stored and  available for review in the ultrasound unit.  Impression: Technically successful ultrasound guided injection. Lot number: [id number] BL knee  Return in 1 week gel shots 3/3

## 2022-10-02 ENCOUNTER — Other Ambulatory Visit: Payer: Self-pay

## 2022-10-02 ENCOUNTER — Ambulatory Visit: Payer: Medicare Other | Admitting: Family Medicine

## 2022-10-02 DIAGNOSIS — M25562 Pain in left knee: Secondary | ICD-10-CM | POA: Diagnosis not present

## 2022-10-02 DIAGNOSIS — M25561 Pain in right knee: Secondary | ICD-10-CM | POA: Diagnosis not present

## 2022-10-02 DIAGNOSIS — M170 Bilateral primary osteoarthritis of knee: Secondary | ICD-10-CM | POA: Diagnosis not present

## 2022-10-02 DIAGNOSIS — G8929 Other chronic pain: Secondary | ICD-10-CM

## 2022-10-02 MED ORDER — SODIUM HYALURONATE (VISCOSUP) 16.8 MG/2ML IX SOSY
16.8000 mg | PREFILLED_SYRINGE | Freq: Once | INTRA_ARTICULAR | Status: AC
Start: 2022-10-02 — End: 2022-10-02
  Administered 2022-10-02: 16.8 mg via INTRA_ARTICULAR

## 2022-10-02 NOTE — Patient Instructions (Addendum)
Thank you for coming in today.   You received an injection today. Seek immediate medical attention if the joint becomes red, extremely painful, or is oozing fluid.   Check back as needed 

## 2022-10-02 NOTE — Progress Notes (Signed)
[name] presents to clinic today for Gelsyn injection bilateral knee 3/3 Procedure: Real-time Ultrasound Guided Injection of right knee joint superior lateral patellar space Device: Philips Affiniti 50G/GE Logiq Images permanently stored and available for review in PACS Verbal informed consent obtained.  Discussed risks and benefits of procedure. Warned about infection, bleeding, damage to structures among others. Patient expresses understanding and agreement Time-out conducted.   Noted no overlying erythema, induration, or other signs of local infection.   Skin prepped in a sterile fashion.   Local anesthesia: Topical Ethyl chloride.   With sterile technique and under real time ultrasound guidance: Gelsyn 2 mL injected into knee joint. Fluid seen entering the joint capsule.   Completed without difficulty   Advised to call if fevers/chills, erythema, induration, drainage, or persistent bleeding.   Images permanently stored and available for review in the ultrasound unit.  Impression: Technically successful ultrasound guided injection.  Procedure: Real-time Ultrasound Guided Injection of left knee joint superior lateral patellar space Device: Philips Affiniti 50G/GE Logiq Images permanently stored and available for review in PACS Verbal informed consent obtained.  Discussed risks and benefits of procedure. Warned about infection, bleeding, damage to structures among others. Patient expresses understanding and agreement Time-out conducted.   Noted no overlying erythema, induration, or other signs of local infection.   Skin prepped in a sterile fashion.   Local anesthesia: Topical Ethyl chloride.   With sterile technique and under real time ultrasound guidance: Gelsyn 2 mL injected into knee joint. Fluid seen entering the joint capsule.   Completed without difficulty   Advised to call if fevers/chills, erythema, induration, drainage, or persistent bleeding.   Images permanently stored  and available for review in the ultrasound unit.  Impression: Technically successful ultrasound guided injection. Lot number: [id number] for both injections

## 2022-10-03 NOTE — Telephone Encounter (Signed)
Pt completed Gelsyn series 10/02/22.   Can consider repeat on or after 04/05/23

## 2022-11-01 ENCOUNTER — Encounter (INDEPENDENT_AMBULATORY_CARE_PROVIDER_SITE_OTHER): Payer: Self-pay

## 2022-11-29 ENCOUNTER — Ambulatory Visit: Payer: Medicare HMO | Admitting: Internal Medicine

## 2022-11-29 ENCOUNTER — Ambulatory Visit: Payer: Medicare Other | Admitting: Family Medicine

## 2022-11-29 ENCOUNTER — Telehealth: Payer: Self-pay | Admitting: Internal Medicine

## 2022-11-29 ENCOUNTER — Encounter: Payer: Self-pay | Admitting: Internal Medicine

## 2022-11-29 VITALS — BP 126/84 | HR 54 | Temp 97.8°F | Ht 59.0 in | Wt 153.0 lb

## 2022-11-29 DIAGNOSIS — E785 Hyperlipidemia, unspecified: Secondary | ICD-10-CM

## 2022-11-29 DIAGNOSIS — Z23 Encounter for immunization: Secondary | ICD-10-CM

## 2022-11-29 DIAGNOSIS — I10 Essential (primary) hypertension: Secondary | ICD-10-CM | POA: Diagnosis not present

## 2022-11-29 DIAGNOSIS — F3342 Major depressive disorder, recurrent, in full remission: Secondary | ICD-10-CM | POA: Diagnosis not present

## 2022-11-29 DIAGNOSIS — E039 Hypothyroidism, unspecified: Secondary | ICD-10-CM | POA: Diagnosis not present

## 2022-11-29 DIAGNOSIS — E118 Type 2 diabetes mellitus with unspecified complications: Secondary | ICD-10-CM

## 2022-11-29 DIAGNOSIS — I5032 Chronic diastolic (congestive) heart failure: Secondary | ICD-10-CM | POA: Diagnosis not present

## 2022-11-29 DIAGNOSIS — Z0000 Encounter for general adult medical examination without abnormal findings: Secondary | ICD-10-CM

## 2022-11-29 DIAGNOSIS — Z0001 Encounter for general adult medical examination with abnormal findings: Secondary | ICD-10-CM

## 2022-11-29 LAB — LIPID PANEL
Cholesterol: 272 mg/dL — ABNORMAL HIGH (ref 0–200)
HDL: 108.2 mg/dL (ref >39.00)
LDL Cholesterol: 145 mg/dL — ABNORMAL HIGH (ref 0–99)
NonHDL: 164.14
Total CHOL/HDL Ratio: 3
Triglycerides: 97 mg/dL (ref 0.0–149.0)
VLDL: 19.4 mg/dL (ref 0.0–40.0)

## 2022-11-29 LAB — HEPATIC FUNCTION PANEL
ALT: 12 U/L (ref 0–35)
AST: 17 U/L (ref 0–37)
Albumin: 4.1 g/dL (ref 3.5–5.2)
Alkaline Phosphatase: 80 U/L (ref 39–117)
Bilirubin, Direct: 0.1 mg/dL (ref 0.0–0.3)
Total Bilirubin: 0.5 mg/dL (ref 0.2–1.2)
Total Protein: 7.3 g/dL (ref 6.0–8.3)

## 2022-11-29 LAB — HEMOGLOBIN A1C: Hgb A1c MFr Bld: 6.6 % — ABNORMAL HIGH (ref 4.6–6.5)

## 2022-11-29 LAB — TSH: TSH: 2.69 u[IU]/mL (ref 0.35–5.50)

## 2022-11-29 MED ORDER — DILTIAZEM HCL ER BEADS 180 MG PO CP24: 180.0000 mg | ORAL_CAPSULE | Freq: Every day | ORAL | 0 refills | Status: DC

## 2022-11-29 MED ORDER — EMPAGLIFLOZIN 10 MG PO TABS: 10.0000 mg | ORAL_TABLET | Freq: Every day | ORAL | 0 refills | Status: DC

## 2022-11-29 MED ORDER — FLUOXETINE HCL 40 MG PO CAPS: 40.0000 mg | ORAL_CAPSULE | Freq: Every day | ORAL | 0 refills | Status: DC

## 2022-11-29 MED ORDER — LOSARTAN POTASSIUM 100 MG PO TABS
100.0000 mg | ORAL_TABLET | Freq: Every day | ORAL | 0 refills | Status: DC
Start: 2022-11-29 — End: 2023-04-09

## 2022-11-29 MED ORDER — BOOSTRIX 5-2.5-18.5 LF-MCG/0.5 IM SUSP
0.5000 mL | Freq: Once | INTRAMUSCULAR | 0 refills | Status: AC
Start: 2022-11-29 — End: 2022-11-29

## 2022-11-29 MED ORDER — ROSUVASTATIN CALCIUM 10 MG PO TABS
10.0000 mg | ORAL_TABLET | Freq: Every day | ORAL | 0 refills | Status: DC
Start: 2022-11-29 — End: 2023-04-09

## 2022-11-29 MED ORDER — CARVEDILOL 3.125 MG PO TABS
3.1250 mg | ORAL_TABLET | Freq: Two times a day (BID) | ORAL | 0 refills | Status: DC
Start: 2022-11-29 — End: 2022-12-24

## 2022-11-29 NOTE — Progress Notes (Unsigned)
Subjective:  Patient ID: [name], female    DOB: 18-Jul-1940  Age: 82 y.o. MRN: [id number]  CC: Annual Exam, Hypertension, Hypothyroidism, Hyperlipidemia, and Diabetes   HPI [name] presents for a CPX and f/up   ----  She continues to complain of DOE.  She denies chest pain, diaphoresis, weight gain, or lower extremity edema.  Outpatient Medications Prior to Visit  Medication Sig Dispense Refill   AMBULATORY NON FORMULARY MEDICATION Folding walker with front wheels.  Use as needed. Disp 1 R26.89 1 each 0   AMBULATORY NON FORMULARY MEDICATION Folding walker with front wheels. Dispense 1 Dx code: R26.89 Use as needed 1 Device 0   aspirin EC 325 MG tablet Take 1 tablet (325 mg total) by mouth daily. 30 tablet 0   Blood Glucose Calibration (ACCU-CHEK GUIDE CONTROL) LIQD 1 Act by In Vitro route 2 (two) times daily as needed. 1 each 3   Blood Glucose Monitoring Suppl (ACCU-CHEK GUIDE ME) w/Device KIT 1 Act by Does not apply route 2 (two) times daily as needed. 2 kit 2   clobetasol ointment (TEMOVATE) 0.05 % APPLY 1 APPLICATION        TOPICALLY TWO TIMES A DAY. 60 g 1   diclofenac Sodium (VOLTAREN) 1 % GEL Apply 4 g topically 4 (four) times daily. 100 g 5   glucose blood (ACCU-CHEK GUIDE) test strip 1 each by Other route 2 (two) times daily. 300 each 1   levothyroxine (SYNTHROID) 50 MCG tablet Take 1 tablet (50 mcg total) by mouth daily. 100 tablet 0   memantine (NAMENDA XR) 28 MG CP24 24 hr capsule Take 1 capsule (28 mg total) by mouth daily. 90 capsule 1   mirabegron ER (MYRBETRIQ) 50 MG TB24 tablet Take 1 tablet (50 mg total) by mouth daily. 100 tablet 0   potassium chloride SA (KLOR-CON M) 20 MEQ tablet Take 1 tablet (20 mEq total) by mouth daily. 90 tablet 0   PRED FORTE 1 % ophthalmic suspension      carvedilol (COREG) 3.125 MG tablet Take 1 tablet (3.125 mg total) by mouth 2 (two) times daily with a meal. 180 tablet 0   diltiazem (TIADYLT ER) 180 MG 24 hr capsule Take  1 capsule (180 mg total) by mouth daily. 100 capsule 0   empagliflozin (JARDIANCE) 10 MG TABS tablet Take 1 tablet (10 mg total) by mouth daily before breakfast. 90 tablet 0   FLUoxetine (PROZAC) 40 MG capsule Take 1 capsule (40 mg total) by mouth daily. 90 capsule 0   losartan (COZAAR) 100 MG tablet Take 1 tablet (100 mg total) by mouth daily. 90 tablet 0   rosuvastatin (CRESTOR) 10 MG tablet Take 1 tablet (10 mg total) by mouth daily. 100 tablet 0   No facility-administered medications prior to visit.    ROS Review of Systems  Constitutional:  Positive for unexpected weight change (wt gain). Negative for appetite change, diaphoresis and fatigue.  HENT: Negative.    Respiratory:  Positive for shortness of breath. Negative for choking, chest tightness and wheezing.   Cardiovascular:  Negative for chest pain, palpitations and leg swelling.  Gastrointestinal: Negative.  Negative for abdominal pain, constipation, diarrhea, nausea and vomiting.  Endocrine: Negative.   Genitourinary: Negative.  Negative for difficulty urinating.  Musculoskeletal:  Positive for arthralgias. Negative for back pain and myalgias.  Skin: Negative.   Neurological: Negative.  Negative for dizziness and light-headedness.  Hematological:  Negative for adenopathy. Does not bruise/bleed easily.  Psychiatric/Behavioral:  Positive  for confusion.     Objective:  BP 126/84 (BP Location: Left Arm, Patient Position: Sitting, Cuff Size: Large)   Pulse (!) 54   Temp 97.8 F (36.6 C) (Oral)   Ht 4\' 11"  (1.499 m)   Wt 153 lb (69.4 kg)   SpO2 97%   BMI 30.90 kg/m   BP Readings from Last 3 Encounters:  11/29/22 126/84  09/17/22 126/62  08/30/22 126/62    Wt Readings from Last 3 Encounters:  11/29/22 153 lb (69.4 kg)  09/17/22 146 lb (66.2 kg)  08/30/22 143 lb (64.9 kg)    Physical Exam Vitals reviewed.  Constitutional:      Appearance: Normal appearance.  HENT:     Mouth/Throat:     Mouth: Mucous membranes  are moist.  Eyes:     General: No scleral icterus.    Conjunctiva/sclera: Conjunctivae normal.  Cardiovascular:     Rate and Rhythm: Normal rate and regular rhythm.     Heart sounds: No murmur heard. Pulmonary:     Effort: Pulmonary effort is normal.     Breath sounds: No stridor. No wheezing, rhonchi or rales.  Abdominal:     General: Abdomen is flat.     Palpations: There is no mass.     Tenderness: There is no abdominal tenderness. There is no guarding.     Hernia: No hernia is present.  Musculoskeletal:        General: Normal range of motion.     Cervical back: Neck supple.     Right lower leg: No edema.     Left lower leg: No edema.  Lymphadenopathy:     Cervical: No cervical adenopathy.  Skin:    General: Skin is warm and dry.  Neurological:     General: No focal deficit present.     Mental Status: She is alert. Mental status is at baseline.  Psychiatric:        Mood and Affect: Mood normal.        Behavior: Behavior normal.     Lab Results  Component Value Date   WBC 7.4 09/03/2022   HGB 12.5 09/03/2022   HCT 39.0 09/03/2022   PLT 252.0 09/03/2022   GLUCOSE 95 09/03/2022   CHOL 272 (H) 11/29/2022   TRIG 97.0 11/29/2022   HDL 108.20 11/29/2022   LDLDIRECT 120.6 11/24/2012   LDLCALC 145 (H) 11/29/2022   ALT 12 11/29/2022   AST 17 11/29/2022   NA 140 09/03/2022   K 4.0 09/03/2022   CL 108 09/03/2022   CREATININE 0.91 09/03/2022   BUN 14 09/03/2022   CO2 23 09/03/2022   TSH 2.69 11/29/2022   INR 1.0 11/20/2021   HGBA1C 6.6 (H) 11/29/2022   MICROALBUR 1.7 02/15/2022    MM DIAG BREAST TOMO BILATERAL  Result Date: 02/01/2022 CLINICAL DATA:  82 year old female with bilateral breast discomfort. In addition, the patient has had left breast surgery approximately 30 years ago for cyst removal (patient states surgeon went directly through her left nipple) and she would like to make sure her site of prior breast surgery is stable. EXAM: DIGITAL DIAGNOSTIC  BILATERAL MAMMOGRAM WITH TOMOSYNTHESIS; ULTRASOUND LEFT BREAST LIMITED TECHNIQUE: Bilateral digital diagnostic mammography and breast tomosynthesis was performed.; Targeted ultrasound examination of the left breast was performed. COMPARISON:  Previous exam(s). ACR Breast Density Category c: The breast tissue is heterogeneously dense, which may obscure small masses. FINDINGS: No suspicious masses or calcifications are seen in either breast. Postsurgical appearance of the left breast  is felt to be stable dating back to prior mammograms from 2007. There is no mammographic evidence of malignancy in either breast. Targeted ultrasound at the site of most tenderness in the outer left breast was performed. No suspicious masses or abnormality seen, only normal-appearing fibroglandular tissue identified. In addition, sonographic evaluation of patient's prior site of surgery was performed demonstrating only expected postsurgical change with no suspicious masses or any other worrisome abnormalities identified. IMPRESSION: No findings of malignancy in either breast. RECOMMENDATION: 1. Recommend further management of bilateral breast tender be based on clinical assessment. 2.  Screening mammogram in one year.(Code:SM-B-01Y) I have discussed the findings and recommendations with the patient. If applicable, a reminder letter will be sent to the patient regarding the next appointment. BI-RADS CATEGORY  1: Negative. Electronically Signed   By: Edwin Cap M.D.   On: 02/01/2022 12:40   US BREAST LTD UNI LEFT INC AXILLA  Result Date: 02/01/2022 CLINICAL DATA:  82 year old female with bilateral breast discomfort. In addition, the patient has had left breast surgery approximately 30 years ago for cyst removal (patient states surgeon went directly through her left nipple) and she would like to make sure her site of prior breast surgery is stable. EXAM: DIGITAL DIAGNOSTIC BILATERAL MAMMOGRAM WITH TOMOSYNTHESIS; ULTRASOUND LEFT  BREAST LIMITED TECHNIQUE: Bilateral digital diagnostic mammography and breast tomosynthesis was performed.; Targeted ultrasound examination of the left breast was performed. COMPARISON:  Previous exam(s). ACR Breast Density Category c: The breast tissue is heterogeneously dense, which may obscure small masses. FINDINGS: No suspicious masses or calcifications are seen in either breast. Postsurgical appearance of the left breast is felt to be stable dating back to prior mammograms from 2007. There is no mammographic evidence of malignancy in either breast. Targeted ultrasound at the site of most tenderness in the outer left breast was performed. No suspicious masses or abnormality seen, only normal-appearing fibroglandular tissue identified. In addition, sonographic evaluation of patient's prior site of surgery was performed demonstrating only expected postsurgical change with no suspicious masses or any other worrisome abnormalities identified. IMPRESSION: No findings of malignancy in either breast. RECOMMENDATION: 1. Recommend further management of bilateral breast tender be based on clinical assessment. 2.  Screening mammogram in one year.(Code:SM-B-01Y) I have discussed the findings and recommendations with the patient. If applicable, a reminder letter will be sent to the patient regarding the next appointment. BI-RADS CATEGORY  1: Negative. Electronically Signed   By: Edwin Cap M.D.   On: 02/01/2022 12:40   Assessment & Plan:  Flu vaccine need -     Flu Vaccine Trivalent High Dose (Fluad)  Need for prophylactic vaccination with combined diphtheria-tetanus-pertussis (DTP) vaccine -     Boostrix; Inject 0.5 mLs into the muscle once for 1 dose.  Dispense: 0.5 mL; Refill: 0  Chronic heart failure with preserved ejection fraction (HCC)- She has a normal volume status. -     Carvedilol; Take 1 tablet (3.125 mg total) by mouth 2 (two) times daily with a meal.  Dispense: 180 tablet; Refill: 0 -      dilTIAZem HCl ER Beads; Take 1 capsule (180 mg total) by mouth daily.  Dispense: 100 capsule; Refill: 0 -     Empagliflozin; Take 1 tablet (10 mg total) by mouth daily before breakfast.  Dispense: 90 tablet; Refill: 0 -     Losartan Potassium; Take 1 tablet (100 mg total) by mouth daily.  Dispense: 90 tablet; Refill: 0  Essential hypertension- Her blood pressure is adequately well-controlled. -  Carvedilol; Take 1 tablet (3.125 mg total) by mouth 2 (two) times daily with a meal.  Dispense: 180 tablet; Refill: 0 -     dilTIAZem HCl ER Beads; Take 1 capsule (180 mg total) by mouth daily.  Dispense: 100 capsule; Refill: 0 -     Losartan Potassium; Take 1 tablet (100 mg total) by mouth daily.  Dispense: 90 tablet; Refill: 0 -     TSH; Future  Recurrent major depressive disorder, in full remission (HCC) -     FLUoxetine HCl; Take 1 capsule (40 mg total) by mouth daily.  Dispense: 90 capsule; Refill: 0  Type II diabetes mellitus with manifestations (HCC)- Her blood sugar is well-controlled. -     Losartan Potassium; Take 1 tablet (100 mg total) by mouth daily.  Dispense: 90 tablet; Refill: 0 -     HM Diabetes Foot Exam -     Hemoglobin A1c; Future  Hyperlipidemia with target LDL less than 100 - LDL goal achieved. Doing well on the statin  -     Rosuvastatin Calcium; Take 1 tablet (10 mg total) by mouth daily.  Dispense: 100 tablet; Refill: 0 -     Lipid panel; Future -     Hepatic function panel; Future  Acquired hypothyroidism- She is euthyroid. -     TSH; Future  Encounter for general adult medical examination with abnormal findings - Exam completed, labs reviewed, vaccines reviewed and updated, no cancer screenings indicated my navigable, pt ed material was given.      Follow-up: Return in about 3 months (around 02/28/2023).  Sanda Linger, MD

## 2022-11-29 NOTE — Patient Instructions (Signed)

## 2022-11-29 NOTE — Telephone Encounter (Signed)
Patient called and said that an A1C order needs to be sent to the lab.  When she was there today they went ahead and drew the extra blood.  They just need the order.

## 2022-12-03 MED ORDER — BOOSTRIX 5-2.5-18.5 LF-MCG/0.5 IM SUSP: 0.5000 mL | Freq: Once | INTRAMUSCULAR | 0 refills | Status: DC

## 2022-12-13 ENCOUNTER — Encounter (HOSPITAL_BASED_OUTPATIENT_CLINIC_OR_DEPARTMENT_OTHER): Payer: Self-pay | Admitting: Cardiovascular Disease

## 2022-12-13 ENCOUNTER — Ambulatory Visit (HOSPITAL_BASED_OUTPATIENT_CLINIC_OR_DEPARTMENT_OTHER): Payer: Medicare HMO | Admitting: Cardiovascular Disease

## 2022-12-13 VITALS — BP 152/88 | HR 72 | Ht 59.0 in | Wt 157.2 lb

## 2022-12-13 DIAGNOSIS — E785 Hyperlipidemia, unspecified: Secondary | ICD-10-CM

## 2022-12-13 DIAGNOSIS — I10 Essential (primary) hypertension: Secondary | ICD-10-CM | POA: Diagnosis not present

## 2022-12-13 DIAGNOSIS — E118 Type 2 diabetes mellitus with unspecified complications: Secondary | ICD-10-CM | POA: Diagnosis not present

## 2022-12-13 DIAGNOSIS — R0609 Other forms of dyspnea: Secondary | ICD-10-CM | POA: Diagnosis not present

## 2022-12-13 DIAGNOSIS — Z5181 Encounter for therapeutic drug level monitoring: Secondary | ICD-10-CM | POA: Diagnosis not present

## 2022-12-13 MED ORDER — METOPROLOL TARTRATE 100 MG PO TABS: ORAL_TABLET | ORAL | 0 refills | Status: DC

## 2022-12-13 MED ORDER — SPIRONOLACTONE 25 MG PO TABS: 25.0000 mg | ORAL_TABLET | Freq: Every day | ORAL | 3 refills | Status: DC

## 2022-12-13 MED ORDER — PREDNISONE 50 MG PO TABS: ORAL_TABLET | ORAL | 0 refills | Status: DC

## 2022-12-13 NOTE — Progress Notes (Signed)
Cardiology Office Note:  .    Date:  12/13/2022  ID:  [name], DOB 05-01-1940, MRN [id number] PCP: [name], MD  [facility] Providers Cardiologist:  Lance Muss, MD     History of Present Illness: .    [name] is a 82 y.o. female with hypertension, dyslipidemia, hypothyroidism, diabetes type 2, GERD, arthritis, obesity, sleep apnea on CPAP, who presents for follow-up and to establish care with me. She is formerly a patient of Dr. Eldridge Dace, last seen by him 10/04/2021. At that visit, her DOE and strength were improving with regular speech, occupational, and physical therapy. Noted to have had diastolic dysfunction and increased BNP in the past. Home blood pressures had been as high as 160s systolic, so Coreg was increased to 6.25 mg BID. Previously on medications for dementia but they were discontinued. She had an echo 08/2020 with LVEF 60-65%, mild LVH, grade 1 diastolic dysfunction, trivial mitral regurgitation, mild to moderate aortic valve sclerosis without stenosis.   Initially seen by Dr. Antoine Poche in 07/2011 for dyspnea at rest. Lexiscan Myoview in 07/2011 was negative. She had PFTs and a noncontrasted CT that demonstrated no clear etiology for the dyspnea. She underwent right heart catheterization 09/2011 showing normal right heart pressures and saturations. No evidence of intracardiac shunt.   Today, she reports struggling with intermittent dyspnea on exertion such as after getting up and walking minimal distances, or after eating a meal. This has been more of an issue within the past year. When it occurs she doesn't panic and focuses on taking slow, deep breaths until her breathing stabilizes. No associated chest pains, no palpitations, no falls. No shortness of breath while lying down. She lives in [place] [facility]. For exercise she will walk for 15-20 minutes up and down the hall, or go outside. She takes her time and doesn't always feel short of  breath. At other times she will walk into the hall and sometimes be out of breath by the time she reaches the elevator. Additionally she complains of tenderness in her LLE, but not in the right. Receives injection therapy followed by Dr. Denyse Amass. In the office her blood pressure is 150/94 initially, and 152/88 on manual recheck. At home her blood pressure is often in the 120s/90s or a little higher. If her blood pressure is elevated to the 150s systolic, she will avoid taking her losartan and go lie down to relax. Usually she takes all of her medications around 5-6 AM, with yogurt. Currently on diltiazem, she affirms never having issues with heart racing or palpitations. She never adds extra salt to her meals. Also she has noticed that she is gaining weight, which she attributes to eating too much ice cream. Her weight in the office is 157; previously stable at home in the 140s. She denies any peripheral edema, lightheadedness, headaches, syncope, or PND.  ROS:  Please see the history of present illness. All other systems are reviewed and negative.  (+) Shortness of breath (+) LLE pain/tenderness  Studies Reviewed: .        Echo  09-20-2020:  1. Left ventricular ejection fraction, by estimation, is 60 to 65%. The  left ventricle has normal function. The left ventricle has no regional  wall motion abnormalities. There is mild asymmetric left ventricular  hypertrophy of the basal-septal segment.  Left ventricular diastolic parameters are consistent with Grade I  diastolic dysfunction (impaired relaxation).   2. Right ventricular systolic function is normal. The right ventricular  size is normal. There is normal pulmonary artery systolic pressure. The  estimated right ventricular systolic pressure is 24.0 mmHg.   3. The mitral valve is normal in structure. Trivial mitral valve  regurgitation.   4. The aortic valve is tricuspid. There is mild calcification of the  aortic valve. There is mild  thickening of the aortic valve. Aortic valve  regurgitation is not visualized. Mild to moderate aortic valve  sclerosis/calcification is present, without any  evidence of aortic stenosis.   Comparison(s): Compared to prior echo report in 2015, there is no  significant change.   Risk Assessment/Calculations:     HYPERTENSION CONTROL Vitals:   12/13/22 0856 12/13/22 0933  BP: (!) 150/94 (!) 152/88    The patient's blood pressure is elevated above target today.  In order to address the patient's elevated BP: A new medication was prescribed today.    Physical Exam:    VS:  BP (!) 152/88 (BP Location: Right Arm, Patient Position: Sitting, Cuff Size: Large)   Pulse 72   Ht 4\' 11"  (1.499 m)   Wt 157 lb 3.2 oz (71.3 kg)   SpO2 96%   BMI 31.75 kg/m  , BMI Body mass index is 31.75 kg/m. GENERAL:  Well appearing HEENT: Pupils equal round and reactive, fundi not visualized, oral mucosa unremarkable NECK:  No jugular venous distention, waveform within normal limits, carotid upstroke brisk and symmetric, no bruits, no thyromegaly LUNGS:  Clear to auscultation bilaterally HEART:  RRR.  PMI not displaced or sustained,S1 and S2 within normal limits, no S3, no S4, no clicks, no rubs, no murmurs ABD:  Flat, positive bowel sounds normal in frequency in pitch, no bruits, no rebound, no guarding, no midline pulsatile mass, no hepatomegaly, no splenomegaly EXT:  2 plus pulses throughout, no edema, no cyanosis no clubbing SKIN:  No rashes no nodules NEURO:  Cranial nerves II through XII grossly intact, motor grossly intact throughout PSYCH:  Cognitively intact, oriented to person place and time  Wt Readings from Last 3 Encounters:  12/13/22 157 lb 3.2 oz (71.3 kg)  11/29/22 153 lb (69.4 kg)  09/17/22 146 lb (66.2 kg)     ASSESSMENT AND PLAN: .    # Exertional Dyspnea Onset of shortness of breath with exertion over the past few months. No associated chest pain. No orthopnea. No lower  extremity edema. EKG previously unremarkable. -Order coronary CTA to evaluate for coronary artery disease. -Order echocardiogram to assess for heart failure. -Review results in follow-up visit.  # Hypertension Blood pressure elevated at 152/80 despite medication adherence. History of hypokalemia with previous diuretic use. -Start Spironolactone 25mg  daily. -Continue carvedilol and losartan. -Check basic metabolic panel in 1 week to monitor potassium levels. -Encourage patient to record home blood pressure readings.   # Hyperlipidemia:  Continue rosuvastatin.   # Weight Gain Patient reports recent weight gain. -Discuss dietary modifications, specifically reducing ice cream intake. -Plan to discuss increasing exercise once cardiac evaluation is complete.  Follow-up in 1-2 months to review test results, assess blood pressure control, and discuss weight management.      Dispo:  FU with Florestine Carmical C. Duke Salvia, MD, [facility] in 1-2 months.  I,Mathew Stumpf,acting as a Neurosurgeon for Chilton Si, MD.,have documented all relevant documentation on the behalf of Chilton Si, MD,as directed by  Chilton Si, MD while in the presence of Chilton Si, MD.  I, Jax Kentner C. Duke Salvia, MD have reviewed all documentation for this visit.  The documentation of the exam, diagnosis, procedures,  and orders on 12/13/2022 are all accurate and complete.   Signed, Chilton Si, MD

## 2022-12-13 NOTE — Patient Instructions (Addendum)
Medication Instructions:  START SPIRONOLACTONE 25 MG DAILY   TAKE BELOW FOR YOUR ALLERGY TO CONTRAST  1 Prednisone 50 mg - take 13 hours prior to test 2 Take another Prednisone 50 mg 7 hours prior to test 3 Take another Prednisone 50 mg 1 hour prior to test 4 Take Benadryl 50 mg 1 hour prior to test Patient must complete all four doses of above prophylactic medications. Patient will need a ride after test due to Benadryl.  MORNING OF YOU CT DO NOT TAKE YOUR CARVEDILOL AND TAKE METOPROLOL 100 MG 2 HOURS PRIOR TO CT   *If you need a refill on your cardiac medications before your next appointment, please call your pharmacy*  Lab Work: BMET IN 1 WEEK  If you have labs (blood work) drawn today and your tests are completely normal, you will receive your results only by: MyChart Message (if you have MyChart) OR A paper copy in the mail If you have any lab test that is abnormal or we need to change your treatment, we will call you to review the results.  Testing/Procedures: Your physician has requested that you have an echocardiogram. Echocardiography is a painless test that uses sound waves to create images of your heart. It provides your doctor with information about the size and shape of your heart and how well your heart's chambers and valves are working. This procedure takes approximately one hour. There are no restrictions for this procedure. Please do NOT wear cologne, perfume, aftershave, or lotions (deodorant is allowed). Please arrive 15 minutes prior to your appointment time.  Your physician has requested that you have cardiac CT. Cardiac computed tomography (CT) is a painless test that uses an x-ray machine to take clear, detailed pictures of your heart. For further information please visit [email or web address] Please follow instruction sheet as given.  Follow-Up: At [facility], you and your health needs are our priority.  As part of our continuing mission to provide you  with exceptional heart care, we have created designated Provider Care Teams.  These Care Teams include your primary Cardiologist (physician) and Advanced Practice Providers (APPs -  Physician Assistants and Nurse Practitioners) who all work together to provide you with the care you need, when you need it.  We recommend signing up for the patient portal called "MyChart".  Sign up information is provided on this After Visit Summary.  MyChart is used to connect with patients for Virtual Visits (Telemedicine).  Patients are able to view lab/test results, encounter notes, upcoming appointments, etc.  Non-urgent messages can be sent to your provider as well.   To learn more about what you can do with MyChart, go to [email or web address].    Your next appointment:   1 TO 2  month(s)  Provider:   Chilton Si, MD or Gillian Shields, NP     Other Instructions   Your cardiac CT will be scheduled at one of the below locations:   [facility] [address] [place], [place] [address] [phone]  OR  [facility] [address] [place], [place] [address] [phone]  OR   [facility] [address] [place], [place] [address] [phone]  If scheduled at [facility], please arrive at the [facility] and Children's Entrance (Entrance C2) of [facility] 30 minutes prior to test start time. You can use the FREE valet parking offered at entrance C (encouraged to control the heart rate for the test)  Proceed to the [facility] Radiology Department (first floor) to check-in and test prep.  All radiology patients and guests should use entrance C2 at [facility], accessed from [facility], even though the hospital's physical address listed is [address]    If scheduled at [facility] or [facility], please arrive 15  mins early for check-in and test prep.  There is spacious parking and easy access to the radiology department from the [facility] entrance. Please enter here and check-in with the desk attendant.   Please follow these instructions carefully (unless otherwise directed):  An IV will be required for this test and Nitroglycerin will be given.  Hold all erectile dysfunction medications at least 3 days (72 hrs) prior to test. (Ie viagra, cialis, sildenafil, tadalafil, etc)   On the Night Before the Test: Be sure to Drink plenty of water. Do not consume any caffeinated/decaffeinated beverages or chocolate 12 hours prior to your test. Do not take any antihistamines 12 hours prior to your test. If the patient has contrast allergy: Patient will need a prescription for Prednisone and very clear instructions (as follows): Prednisone 50 mg - take 13 hours prior to test Take another Prednisone 50 mg 7 hours prior to test Take another Prednisone 50 mg 1 hour prior to test Take Benadryl 50 mg 1 hour prior to test Patient must complete all four doses of above prophylactic medications. Patient will need a ride after test due to Benadryl.  On the Day of the Test: Drink plenty of water until 1 hour prior to the test. Do not eat any food 1 hour prior to test. You may take your regular medications prior to the test.  Take metoprolol (Lopressor) two hours prior to test. If you take Furosemide/Hydrochlorothiazide/Spironolactone, please HOLD on the morning of the test. FEMALES- please wear underwire-free bra if available, avoid dresses & tight clothing  After the Test: Drink plenty of water. After receiving IV contrast, you may experience a mild flushed feeling. This is normal. On occasion, you may experience a mild rash up to 24 hours after the test. This is not dangerous. If this occurs, you can take Benadryl 25 mg and increase your fluid intake. If you experience trouble breathing, this can  be serious. If it is severe call 911 IMMEDIATELY. If it is mild, please call our office. If you take any of these medications: Glipizide/Metformin, Avandament, Glucavance, please do not take 48 hours after completing test unless otherwise instructed.  We will call to schedule your test 2-4 weeks out understanding that some insurance companies will need an authorization prior to the service being performed.   For more information and frequently asked questions, please visit our website : [email or web address]  For non-scheduling related questions, please contact the cardiac imaging nurse navigator should you have any questions/concerns: Cardiac Imaging Nurse Navigators Direct Office Dial: [phone]   For scheduling needs, including cancellations and rescheduling, please call [place], [phone].  Cardiac CT Angiogram A cardiac CT angiogram is a procedure to look at the heart and the area around the heart. It may be done to help find the cause of chest pains or other symptoms of heart disease. During this procedure, a substance called contrast dye is injected into a vein in the arm. The contrast highlights the blood vessels in the area to be checked. A large X-ray machine (CT scanner), then takes detailed pictures of the heart and the surrounding area. The  procedure is also sometimes called a coronary CT angiogram, coronary artery scanning, or CTA. A cardiac CT angiogram allows the health care provider to see how well blood is flowing to and from the heart. The provider will be able to see if there are any problems, such as: Blockage or narrowing of the arteries in the heart. Fluid around the heart. Signs of weakness or disease in the muscles, valves, and tissues of the heart. Tell a health care provider about: Any allergies you have. This is especially important if you have had a previous allergic reaction to medicines, contrast dye, or iodine. All medicines you are taking,  including vitamins, herbs, eye drops, creams, and over-the-counter medicines. Any bleeding problems you have. Any surgeries you have had. Any medical conditions you have, including kidney problems or kidney failure. Whether you are pregnant or may be pregnant. Any anxiety disorders, chronic pain, or other conditions you have. These may increase your stress or prevent you from lying still. Any history of abnormal heart rhythms or heart procedures. What are the risks? Your provider will talk with you about risks. These may include: Bleeding. Infection. Allergic reactions to medicines or dyes. Damage to other structures or organs. Kidney damage from the contrast dye. Increased risk of cancer from radiation exposure. This risk is low. Talk with your provider about: The risks and benefits of testing. How you can receive the lowest dose of radiation. What happens before the procedure? Wear comfortable clothing and remove any jewelry, glasses, dentures, and hearing aids. Follow instructions from your provider about eating and drinking. These may include: 12 hours before the procedure Avoid caffeine. This includes tea, coffee, soda, energy drinks, and diet pills. Drink plenty of water or other fluids that do not have caffeine in them. Being well hydrated can prevent complications. 4-6 hours before the procedure Stop eating and drinking. This will reduce the risk of nausea from the contrast dye. Ask your provider about changing or stopping your regular medicines. These include: Diabetes medicines. Medicines to treat problems with erections (erectile dysfunction). If you have kidney problems, you may need to receive IV hydration before and after the test. What happens during the procedure?  Hair on your chest may need to be removed so that small sticky patches called electrodes can be placed on your chest. These will transmit information that helps to monitor your heart during the procedure. An  IV will be inserted into one of your veins. You might be given a medicine to control your heart rate during the procedure. This will help to ensure that good images are obtained. You will be asked to lie on an exam table. This table will slide in and out of the CT machine during the procedure. Contrast dye will be injected into the IV. You might feel warm, or you may get a metallic taste in your mouth. You may be given medicines to relax or dilate the arteries in your heart. If you are allergic to contrast dyes or iodine you may be given medicine before the test to reduce the risk of an allergic reaction. The table that you are lying on will move into the CT machine tunnel for the scan. The person running the machine will give you instructions while the scans are being done. You may be asked to: Keep your arms above your head. Hold your breath for short periods. Stay very still, even if the table is moving. The procedure may vary among providers and hospitals. What can I expect  after the procedure? After your procedure, it is common to have: A metallic taste in your mouth from the contrast dye. A feeling of warmth. A headache from the heart medicine. Follow these instructions at home: Take over-the-counter and prescription medicines only as told by your provider. If you are told, drink enough fluid to keep your pee pale yellow. This will help to flush the contrast dye out of your body. Most people can return to their normal activities right after the procedure. Ask your provider what activities are safe for you. It is up to you to get the results of your procedure. Ask your provider, or the department that is doing the procedure, when your results will be ready. Contact a health care provider if: You have any symptoms of allergy to the contrast dye. These include: Shortness of breath. Rash or hives. A racing heartbeat. You notice a change in your peeing (urination). This information is  not intended to replace advice given to you by your health care provider. Make sure you discuss any questions you have with your health care provider. Document Revised: 10/06/2021 Document Reviewed: 10/06/2021 Elsevier Patient Education  2024 [employer].

## 2022-12-14 ENCOUNTER — Telehealth: Payer: Self-pay | Admitting: Cardiovascular Disease

## 2022-12-14 NOTE — Telephone Encounter (Signed)
Printed and taken to Dr. Duke Salvia for review!

## 2022-12-14 NOTE — Telephone Encounter (Signed)
Pt c/o Shortness Of Breath: STAT if SOB developed within the last 24 hours or pt is noticeably SOB on the phone  1. Are you currently SOB (can you hear that pt is SOB on the phone)? yes  2. How long have you been experiencing SOB? today  3. Are you SOB when sitting or when up moving around? both  4. Are you currently experiencing any other symptoms? no

## 2022-12-14 NOTE — Telephone Encounter (Signed)
Discussed with Dr Duke Salvia who recommended patient go to ED for evaluation  Advised patient, verbalized understanding

## 2022-12-14 NOTE — Telephone Encounter (Addendum)
Call transferred from call center as stat call, she was seen by Dr. Duke Salvia yesterday. She is noting shortness of breath, but is not verbally short of breath on the phone. She states the whole ride to the pharmacy and now that she is back her shortness of breath has not recovered. She states she does not feel like she is having an anxiety attack. She does not know what trigger and it will not resolved. She does endorse still feeling short of breath.  She has not checked her blood pressure yet today, but will check it while on the phone. BP while on the phone 123/77 69.   Of note she states her driver leaves at 469, she states she will not go by EMS.

## 2022-12-18 ENCOUNTER — Ambulatory Visit: Payer: Medicare HMO | Admitting: Student

## 2022-12-19 ENCOUNTER — Telehealth (HOSPITAL_COMMUNITY): Payer: Self-pay | Admitting: *Deleted

## 2022-12-19 NOTE — Telephone Encounter (Signed)
Reaching out to patient to offer assistance regarding upcoming cardiac imaging study; pt verbalizes understanding of appt date/time, parking situation and where to check in, pre-test NPO status and medications ordered, and verified current allergies; name and call back number provided for further questions should they arise Hayley Sharpe RN Navigator Cardiac [facility] [phone] office [phone] cell  

## 2022-12-20 ENCOUNTER — Ambulatory Visit (HOSPITAL_BASED_OUTPATIENT_CLINIC_OR_DEPARTMENT_OTHER)
Admission: RE | Admit: 2022-12-20 | Discharge: 2022-12-20 | Disposition: A | Discharge status: Home / Self Care | Payer: Medicare HMO | Source: Ambulatory Visit | Attending: Cardiovascular Disease | Admitting: Cardiovascular Disease

## 2022-12-20 DIAGNOSIS — I700 Atherosclerosis of aorta: Secondary | ICD-10-CM | POA: Diagnosis not present

## 2022-12-20 DIAGNOSIS — T447X1A Poisoning by beta-adrenoreceptor antagonists, accidental (unintentional), initial encounter: Secondary | ICD-10-CM | POA: Diagnosis not present

## 2022-12-20 DIAGNOSIS — R0609 Other forms of dyspnea: Secondary | ICD-10-CM | POA: Insufficient documentation

## 2022-12-20 DIAGNOSIS — R001 Bradycardia, unspecified: Secondary | ICD-10-CM | POA: Diagnosis not present

## 2022-12-20 MED ORDER — NITROGLYCERIN 0.4 MG SL SUBL
0.8000 mg | SUBLINGUAL_TABLET | Freq: Once | SUBLINGUAL | Status: AC
  Administered 2022-12-20: 0.8 mg via SUBLINGUAL

## 2022-12-20 MED ORDER — NITROGLYCERIN 0.4 MG SL SUBL
SUBLINGUAL_TABLET | SUBLINGUAL | Status: AC
  Filled 2022-12-20: qty 2

## 2022-12-20 MED ORDER — IOHEXOL 350 MG/ML SOLN
95.0000 mL | Freq: Once | INTRAVENOUS | Status: AC | PRN
  Administered 2022-12-20: 95 mL via INTRAVENOUS

## 2022-12-20 NOTE — Progress Notes (Signed)
Patient ID: [name], female   DOB: [birth year], 82 y.o.   MRN: [id number] After injection of CT contrast second time pt complaining of trouble catching her breath. Vss see flowsheets. Pt sat up. No hives no tonque swelling no redness noted.1214 vss see flowsheets "I feel ok" . Patient discharged home. Ambulatory at discharged.

## 2022-12-21 ENCOUNTER — Telehealth: Payer: Self-pay | Admitting: Cardiology

## 2022-12-21 ENCOUNTER — Emergency Department (HOSPITAL_BASED_OUTPATIENT_CLINIC_OR_DEPARTMENT_OTHER): Payer: Medicare HMO

## 2022-12-21 ENCOUNTER — Telehealth (HOSPITAL_BASED_OUTPATIENT_CLINIC_OR_DEPARTMENT_OTHER): Payer: Self-pay

## 2022-12-21 ENCOUNTER — Encounter (HOSPITAL_BASED_OUTPATIENT_CLINIC_OR_DEPARTMENT_OTHER): Payer: Self-pay | Admitting: Emergency Medicine

## 2022-12-21 ENCOUNTER — Other Ambulatory Visit: Payer: Self-pay

## 2022-12-21 ENCOUNTER — Inpatient Hospital Stay (HOSPITAL_BASED_OUTPATIENT_CLINIC_OR_DEPARTMENT_OTHER)
Admission: EM | Admit: 2022-12-21 | Discharge: 2022-12-24 | DRG: 917 | Disposition: A | Discharge status: Home / Self Care | Payer: Medicare HMO | Attending: Internal Medicine | Admitting: Internal Medicine

## 2022-12-21 DIAGNOSIS — I440 Atrioventricular block, first degree: Secondary | ICD-10-CM | POA: Diagnosis present

## 2022-12-21 DIAGNOSIS — R911 Solitary pulmonary nodule: Secondary | ICD-10-CM | POA: Diagnosis present

## 2022-12-21 DIAGNOSIS — Z888 Allergy status to other drugs, medicaments and biological substances status: Secondary | ICD-10-CM

## 2022-12-21 DIAGNOSIS — I2510 Atherosclerotic heart disease of native coronary artery without angina pectoris: Secondary | ICD-10-CM | POA: Diagnosis present

## 2022-12-21 DIAGNOSIS — R001 Bradycardia, unspecified: Principal | ICD-10-CM | POA: Diagnosis present

## 2022-12-21 DIAGNOSIS — T447X1A Poisoning by beta-adrenoreceptor antagonists, accidental (unintentional), initial encounter: Principal | ICD-10-CM | POA: Diagnosis present

## 2022-12-21 DIAGNOSIS — Z91041 Radiographic dye allergy status: Secondary | ICD-10-CM

## 2022-12-21 DIAGNOSIS — I110 Hypertensive heart disease with heart failure: Secondary | ICD-10-CM | POA: Diagnosis present

## 2022-12-21 DIAGNOSIS — J9601 Acute respiratory failure with hypoxia: Secondary | ICD-10-CM | POA: Diagnosis present

## 2022-12-21 DIAGNOSIS — Z87891 Personal history of nicotine dependence: Secondary | ICD-10-CM

## 2022-12-21 DIAGNOSIS — Z90710 Acquired absence of both cervix and uterus: Secondary | ICD-10-CM

## 2022-12-21 DIAGNOSIS — J309 Allergic rhinitis, unspecified: Secondary | ICD-10-CM | POA: Diagnosis present

## 2022-12-21 DIAGNOSIS — I5030 Unspecified diastolic (congestive) heart failure: Secondary | ICD-10-CM

## 2022-12-21 DIAGNOSIS — F03A4 Unspecified dementia, mild, with anxiety: Secondary | ICD-10-CM | POA: Diagnosis present

## 2022-12-21 DIAGNOSIS — F32A Depression, unspecified: Secondary | ICD-10-CM | POA: Diagnosis present

## 2022-12-21 DIAGNOSIS — F411 Generalized anxiety disorder: Secondary | ICD-10-CM | POA: Diagnosis present

## 2022-12-21 DIAGNOSIS — Z9049 Acquired absence of other specified parts of digestive tract: Secondary | ICD-10-CM

## 2022-12-21 DIAGNOSIS — D72829 Elevated white blood cell count, unspecified: Secondary | ICD-10-CM | POA: Diagnosis present

## 2022-12-21 DIAGNOSIS — Z880 Allergy status to penicillin: Secondary | ICD-10-CM

## 2022-12-21 DIAGNOSIS — T461X1A Poisoning by calcium-channel blockers, accidental (unintentional), initial encounter: Secondary | ICD-10-CM | POA: Diagnosis present

## 2022-12-21 DIAGNOSIS — Z7982 Long term (current) use of aspirin: Secondary | ICD-10-CM

## 2022-12-21 DIAGNOSIS — Z79890 Hormone replacement therapy (postmenopausal): Secondary | ICD-10-CM

## 2022-12-21 DIAGNOSIS — Z7984 Long term (current) use of oral hypoglycemic drugs: Secondary | ICD-10-CM

## 2022-12-21 DIAGNOSIS — E875 Hyperkalemia: Secondary | ICD-10-CM | POA: Diagnosis present

## 2022-12-21 DIAGNOSIS — I700 Atherosclerosis of aorta: Secondary | ICD-10-CM | POA: Diagnosis present

## 2022-12-21 DIAGNOSIS — G4730 Sleep apnea, unspecified: Secondary | ICD-10-CM | POA: Diagnosis present

## 2022-12-21 DIAGNOSIS — N179 Acute kidney failure, unspecified: Secondary | ICD-10-CM | POA: Diagnosis present

## 2022-12-21 DIAGNOSIS — Z881 Allergy status to other antibiotic agents status: Secondary | ICD-10-CM

## 2022-12-21 DIAGNOSIS — Z885 Allergy status to narcotic agent status: Secondary | ICD-10-CM

## 2022-12-21 DIAGNOSIS — D638 Anemia in other chronic diseases classified elsewhere: Secondary | ICD-10-CM | POA: Diagnosis present

## 2022-12-21 DIAGNOSIS — I959 Hypotension, unspecified: Secondary | ICD-10-CM | POA: Diagnosis present

## 2022-12-21 DIAGNOSIS — Z79899 Other long term (current) drug therapy: Secondary | ICD-10-CM

## 2022-12-21 DIAGNOSIS — E785 Hyperlipidemia, unspecified: Secondary | ICD-10-CM

## 2022-12-21 DIAGNOSIS — E669 Obesity, unspecified: Secondary | ICD-10-CM | POA: Diagnosis present

## 2022-12-21 DIAGNOSIS — R197 Diarrhea, unspecified: Secondary | ICD-10-CM | POA: Diagnosis present

## 2022-12-21 DIAGNOSIS — G4733 Obstructive sleep apnea (adult) (pediatric): Secondary | ICD-10-CM | POA: Diagnosis present

## 2022-12-21 DIAGNOSIS — I4891 Unspecified atrial fibrillation: Secondary | ICD-10-CM | POA: Diagnosis present

## 2022-12-21 DIAGNOSIS — E118 Type 2 diabetes mellitus with unspecified complications: Secondary | ICD-10-CM | POA: Diagnosis present

## 2022-12-21 DIAGNOSIS — E039 Hypothyroidism, unspecified: Secondary | ICD-10-CM | POA: Diagnosis present

## 2022-12-21 DIAGNOSIS — I5032 Chronic diastolic (congestive) heart failure: Secondary | ICD-10-CM | POA: Diagnosis present

## 2022-12-21 DIAGNOSIS — E876 Hypokalemia: Secondary | ICD-10-CM | POA: Diagnosis present

## 2022-12-21 DIAGNOSIS — K219 Gastro-esophageal reflux disease without esophagitis: Secondary | ICD-10-CM | POA: Diagnosis present

## 2022-12-21 DIAGNOSIS — E119 Type 2 diabetes mellitus without complications: Secondary | ICD-10-CM | POA: Diagnosis present

## 2022-12-21 DIAGNOSIS — E8721 Acute metabolic acidosis: Secondary | ICD-10-CM | POA: Diagnosis present

## 2022-12-21 DIAGNOSIS — Z6831 Body mass index (BMI) 31.0-31.9, adult: Secondary | ICD-10-CM

## 2022-12-21 LAB — CBC WITH DIFFERENTIAL/PLATELET
Abs Immature Granulocytes: 0.74 10*3/uL — ABNORMAL HIGH (ref 0.00–0.07)
Basophils Absolute: 0.1 10*3/uL (ref 0.0–0.1)
Basophils Relative: 1 %
Eosinophils Absolute: 0.2 10*3/uL (ref 0.0–0.5)
Eosinophils Relative: 1 %
HCT: 38 % (ref 36.0–46.0)
Hemoglobin: 12.3 g/dL (ref 12.0–15.0)
Immature Granulocytes: 5 %
Lymphocytes Relative: 24 %
Lymphs Abs: 3.3 10*3/uL (ref 0.7–4.0)
MCH: 29.6 pg (ref 26.0–34.0)
MCHC: 32.4 g/dL (ref 30.0–36.0)
MCV: 91.3 fL (ref 80.0–100.0)
Monocytes Absolute: 1.1 10*3/uL — ABNORMAL HIGH (ref 0.1–1.0)
Monocytes Relative: 8 %
Neutro Abs: 8.3 10*3/uL — ABNORMAL HIGH (ref 1.7–7.7)
Neutrophils Relative %: 61 %
Platelets: 232 10*3/uL (ref 150–400)
RBC: 4.16 MIL/uL (ref 3.87–5.11)
RDW: 15.5 % (ref 11.5–15.5)
WBC: 13.6 10*3/uL — ABNORMAL HIGH (ref 4.0–10.5)
nRBC: 0 % (ref 0.0–0.2)

## 2022-12-21 LAB — COMPREHENSIVE METABOLIC PANEL
ALT: 22 U/L (ref 0–44)
AST: 24 U/L (ref 15–41)
Albumin: 4.1 g/dL (ref 3.5–5.0)
Alkaline Phosphatase: 68 U/L (ref 38–126)
Anion gap: 11 (ref 5–15)
BUN: 27 mg/dL — ABNORMAL HIGH (ref 8–23)
CO2: 19 mmol/L — ABNORMAL LOW (ref 22–32)
Calcium: 9 mg/dL (ref 8.9–10.3)
Chloride: 109 mmol/L (ref 98–111)
Creatinine, Ser: 1.46 mg/dL — ABNORMAL HIGH (ref 0.44–1.00)
GFR, Estimated: 35 mL/min — ABNORMAL LOW (ref >60)
Glucose, Bld: 131 mg/dL — ABNORMAL HIGH (ref 70–99)
Potassium: 5.3 mmol/L — ABNORMAL HIGH (ref 3.5–5.1)
Sodium: 139 mmol/L (ref 135–145)
Total Bilirubin: 0.4 mg/dL (ref 0.3–1.2)
Total Protein: 6.9 g/dL (ref 6.5–8.1)

## 2022-12-21 LAB — POCT I-STAT CREATININE: Creatinine, Ser: 0.9 mg/dL (ref 0.44–1.00)

## 2022-12-21 LAB — TROPONIN I (HIGH SENSITIVITY): Troponin I (High Sensitivity): 6 ng/L (ref <18)

## 2022-12-21 LAB — MAGNESIUM: Magnesium: 2.5 mg/dL — ABNORMAL HIGH (ref 1.7–2.4)

## 2022-12-21 LAB — BRAIN NATRIURETIC PEPTIDE: B Natriuretic Peptide: 362 pg/mL — ABNORMAL HIGH (ref 0.0–100.0)

## 2022-12-21 MED ORDER — SODIUM ZIRCONIUM CYCLOSILICATE 10 G PO PACK
10.0000 g | PACK | Freq: Once | ORAL | Status: AC
  Administered 2022-12-21: 10 g via ORAL
  Filled 2022-12-21: qty 1

## 2022-12-21 MED ORDER — CALCIUM GLUCONATE-NACL 1-0.675 GM/50ML-% IV SOLN
1.0000 g | Freq: Once | INTRAVENOUS | Status: AC
  Administered 2022-12-21: 1000 mg via INTRAVENOUS
  Filled 2022-12-21: qty 50

## 2022-12-21 MED ORDER — SODIUM CHLORIDE 0.9 % IV BOLUS
500.0000 mL | Freq: Once | INTRAVENOUS | Status: AC
  Administered 2022-12-21: 500 mL via INTRAVENOUS

## 2022-12-21 NOTE — Progress Notes (Signed)
Hospitalist Transfer Note:    Nursing staff, Please call [name] Admits & Consults System-Wide number on Amion [phone]) as soon as patient's arrival, so appropriate admitting provider can evaluate the pt.   Transferring facility: [facility] Requesting provider: Dr. Estelle June (EDP at [facility]) Reason for transfer: admission for further evaluation and management of bradycardia, with junctional rhythm, potentially symptomatic.     82 year old female with history of chronic diastolic heart failure, who presented to [facility] ED complaining of  1-2 days of shortness of breath, dyspnea on exertion.   She follows with [facility] cardiology, and underwent cardiac CT on 12/20/2022, which showed mild nonobstructive CAD.  It appears that she took a one-time dose of metoprolol to tartrate 100 mg prior to the CT scan, while holding her home Coreg.  Her outpatient medications are also notable for daily diltiazem, and she is reported to have recently started on spironolactone.  She is also experiencing some recent loose stool.  Vital signs in the ED were notable for the following: Bradycardia with junctional rhythm, with heart rates in the 30s to 40s; systolic blood pressures in the low 100s to 1 teens.  Oxygen saturation 98% on room air.  EKG showed junctional bradycardia.  She is reported to be mentating at baseline.  Labs were notable for mild hyperkalemia, with potassium 5.3.  Imaging notable for chest x-ray, with final read currently pending, but reported to show no evidence of acute cardiopulmonary process.  Medications administered prior to transfer included the following: Calcium gluconate, Lokelma.  EDP discussed patient's case with on-call cardiology fellow, who recommended [name] admission to [facility], and conveyed that cards will consult; in meantime, cardiology recommends holding home Coreg and home diltiazem.   Subsequently, I accepted this patient for transfer for inpatient admission to a cardiac-tele bed at [facility]  for further work-up and management of the above.      Newton Pigg, DO Hospitalist

## 2022-12-21 NOTE — Telephone Encounter (Signed)
Patient weak and short of breath. Diarrhea. Wanted to go to [facility] ER for evaluation. Advised her that this seems reasonable.   CT cor 12/20/22 - mild non obstructive CAD  Has seen Dr. Chilton Si.   Donato Schultz, MD

## 2022-12-21 NOTE — Telephone Encounter (Addendum)
Results called to patient who verbalizes understanding! Labs ordered.   Patient is still wanting to know what is causing her shortness of breath. Advised will send back to the team to get clarification on next steps.

## 2022-12-21 NOTE — ED Notes (Signed)
Zole pads placed on patient. Dr. Elayne Snare in the room EKG completed

## 2022-12-21 NOTE — ED Triage Notes (Signed)
  Patient BIB family for SOB/dizziness that has been going on for a month.  Patient states she is always out of breath and has a hard time getting around.  Saw her cardiologist and went for cardiac CT yesterday.  Patient HR between 35-42 bpm during triage.  No pain at this time.

## 2022-12-22 DIAGNOSIS — E119 Type 2 diabetes mellitus without complications: Secondary | ICD-10-CM | POA: Diagnosis present

## 2022-12-22 DIAGNOSIS — E669 Obesity, unspecified: Secondary | ICD-10-CM | POA: Diagnosis present

## 2022-12-22 DIAGNOSIS — G4730 Sleep apnea, unspecified: Secondary | ICD-10-CM | POA: Diagnosis present

## 2022-12-22 DIAGNOSIS — E039 Hypothyroidism, unspecified: Secondary | ICD-10-CM | POA: Diagnosis present

## 2022-12-22 DIAGNOSIS — I959 Hypotension, unspecified: Secondary | ICD-10-CM | POA: Diagnosis present

## 2022-12-22 DIAGNOSIS — I110 Hypertensive heart disease with heart failure: Secondary | ICD-10-CM | POA: Diagnosis present

## 2022-12-22 DIAGNOSIS — R911 Solitary pulmonary nodule: Secondary | ICD-10-CM | POA: Diagnosis present

## 2022-12-22 DIAGNOSIS — D638 Anemia in other chronic diseases classified elsewhere: Secondary | ICD-10-CM | POA: Diagnosis present

## 2022-12-22 DIAGNOSIS — F32A Depression, unspecified: Secondary | ICD-10-CM | POA: Diagnosis present

## 2022-12-22 DIAGNOSIS — D72829 Elevated white blood cell count, unspecified: Secondary | ICD-10-CM | POA: Diagnosis present

## 2022-12-22 DIAGNOSIS — I2510 Atherosclerotic heart disease of native coronary artery without angina pectoris: Secondary | ICD-10-CM

## 2022-12-22 DIAGNOSIS — I4891 Unspecified atrial fibrillation: Secondary | ICD-10-CM | POA: Diagnosis present

## 2022-12-22 DIAGNOSIS — T50905A Adverse effect of unspecified drugs, medicaments and biological substances, initial encounter: Secondary | ICD-10-CM | POA: Diagnosis not present

## 2022-12-22 DIAGNOSIS — N179 Acute kidney failure, unspecified: Secondary | ICD-10-CM | POA: Diagnosis present

## 2022-12-22 DIAGNOSIS — I700 Atherosclerosis of aorta: Secondary | ICD-10-CM | POA: Diagnosis present

## 2022-12-22 DIAGNOSIS — T447X1A Poisoning by beta-adrenoreceptor antagonists, accidental (unintentional), initial encounter: Secondary | ICD-10-CM | POA: Diagnosis present

## 2022-12-22 DIAGNOSIS — R001 Bradycardia, unspecified: Secondary | ICD-10-CM | POA: Diagnosis present

## 2022-12-22 DIAGNOSIS — J309 Allergic rhinitis, unspecified: Secondary | ICD-10-CM | POA: Diagnosis present

## 2022-12-22 DIAGNOSIS — F03A4 Unspecified dementia, mild, with anxiety: Secondary | ICD-10-CM | POA: Diagnosis present

## 2022-12-22 DIAGNOSIS — J9601 Acute respiratory failure with hypoxia: Secondary | ICD-10-CM | POA: Diagnosis present

## 2022-12-22 DIAGNOSIS — T461X1A Poisoning by calcium-channel blockers, accidental (unintentional), initial encounter: Secondary | ICD-10-CM | POA: Diagnosis present

## 2022-12-22 DIAGNOSIS — K219 Gastro-esophageal reflux disease without esophagitis: Secondary | ICD-10-CM | POA: Diagnosis present

## 2022-12-22 DIAGNOSIS — E785 Hyperlipidemia, unspecified: Secondary | ICD-10-CM | POA: Diagnosis present

## 2022-12-22 DIAGNOSIS — R197 Diarrhea, unspecified: Secondary | ICD-10-CM | POA: Diagnosis present

## 2022-12-22 DIAGNOSIS — I5032 Chronic diastolic (congestive) heart failure: Secondary | ICD-10-CM | POA: Diagnosis present

## 2022-12-22 DIAGNOSIS — I10 Essential (primary) hypertension: Secondary | ICD-10-CM | POA: Diagnosis not present

## 2022-12-22 DIAGNOSIS — E8721 Acute metabolic acidosis: Secondary | ICD-10-CM | POA: Diagnosis present

## 2022-12-22 LAB — CBC WITH DIFFERENTIAL/PLATELET
Abs Immature Granulocytes: 0.28 10*3/uL — ABNORMAL HIGH (ref 0.00–0.07)
Basophils Absolute: 0 10*3/uL (ref 0.0–0.1)
Basophils Relative: 0 %
Eosinophils Absolute: 0.1 10*3/uL (ref 0.0–0.5)
Eosinophils Relative: 1 %
HCT: 34.1 % — ABNORMAL LOW (ref 36.0–46.0)
Hemoglobin: 11.2 g/dL — ABNORMAL LOW (ref 12.0–15.0)
Immature Granulocytes: 2 %
Lymphocytes Relative: 18 %
Lymphs Abs: 2.3 10*3/uL (ref 0.7–4.0)
MCH: 29.3 pg (ref 26.0–34.0)
MCHC: 32.8 g/dL (ref 30.0–36.0)
MCV: 89.3 fL (ref 80.0–100.0)
Monocytes Absolute: 1.1 10*3/uL — ABNORMAL HIGH (ref 0.1–1.0)
Monocytes Relative: 9 %
Neutro Abs: 8.6 10*3/uL — ABNORMAL HIGH (ref 1.7–7.7)
Neutrophils Relative %: 70 %
Platelets: 217 10*3/uL (ref 150–400)
RBC: 3.82 MIL/uL — ABNORMAL LOW (ref 3.87–5.11)
RDW: 15.3 % (ref 11.5–15.5)
WBC: 12.4 10*3/uL — ABNORMAL HIGH (ref 4.0–10.5)
nRBC: 0 % (ref 0.0–0.2)

## 2022-12-22 LAB — BASIC METABOLIC PANEL
Anion gap: 14 (ref 5–15)
BUN: 26 mg/dL — ABNORMAL HIGH (ref 8–23)
CO2: 16 mmol/L — ABNORMAL LOW (ref 22–32)
Calcium: 8.4 mg/dL — ABNORMAL LOW (ref 8.9–10.3)
Chloride: 111 mmol/L (ref 98–111)
Creatinine, Ser: 1.19 mg/dL — ABNORMAL HIGH (ref 0.44–1.00)
GFR, Estimated: 45 mL/min — ABNORMAL LOW (ref >60)
Glucose, Bld: 134 mg/dL — ABNORMAL HIGH (ref 70–99)
Potassium: 4.2 mmol/L (ref 3.5–5.1)
Sodium: 141 mmol/L (ref 135–145)

## 2022-12-22 LAB — MRSA NEXT GEN BY PCR, NASAL: MRSA by PCR Next Gen: NOT DETECTED

## 2022-12-22 LAB — GLUCOSE, CAPILLARY
Glucose-Capillary: 115 mg/dL — ABNORMAL HIGH (ref 70–99)
Glucose-Capillary: 91 mg/dL (ref 70–99)
Glucose-Capillary: 86 mg/dL (ref 70–99)
Glucose-Capillary: 90 mg/dL (ref 70–99)

## 2022-12-22 LAB — D-DIMER, QUANTITATIVE: D-Dimer, Quant: 13.72 ug{FEU}/mL — ABNORMAL HIGH (ref 0.00–0.50)

## 2022-12-22 LAB — TSH: TSH: 5.329 u[IU]/mL — ABNORMAL HIGH (ref 0.350–4.500)

## 2022-12-22 LAB — TROPONIN I (HIGH SENSITIVITY): Troponin I (High Sensitivity): 13 ng/L (ref <18)

## 2022-12-22 MED ORDER — CHLORHEXIDINE GLUCONATE CLOTH 2 % EX PADS
6.0000 | MEDICATED_PAD | Freq: Every day | CUTANEOUS | Status: DC
  Administered 2022-12-22 – 2022-12-24 (×3): 6 via TOPICAL

## 2022-12-22 MED ORDER — ACETAMINOPHEN 650 MG RE SUPP: 650.0000 mg | Freq: Four times a day (QID) | RECTAL | Status: DC | PRN

## 2022-12-22 MED ORDER — HYDRALAZINE HCL 20 MG/ML IJ SOLN: 5.0000 mg | INTRAMUSCULAR | Status: DC | PRN

## 2022-12-22 MED ORDER — ENOXAPARIN SODIUM 30 MG/0.3ML IJ SOSY
30.0000 mg | PREFILLED_SYRINGE | Freq: Every day | INTRAMUSCULAR | Status: DC
  Administered 2022-12-22 – 2022-12-24 (×3): 30 mg via SUBCUTANEOUS
  Filled 2022-12-22 (×3): qty 0.3

## 2022-12-22 MED ORDER — GLUCAGON HCL RDNA (DIAGNOSTIC) 1 MG IJ SOLR: 4.0000 mg | Freq: Once | INTRAMUSCULAR | Status: DC

## 2022-12-22 MED ORDER — GLUCAGON HCL RDNA (DIAGNOSTIC) 1 MG IJ SOLR
5.0000 mg | Freq: Once | INTRAVENOUS | Status: AC
  Administered 2022-12-22: 5 mg via INTRAVENOUS
  Filled 2022-12-22: qty 5

## 2022-12-22 MED ORDER — INSULIN ASPART 100 UNIT/ML IJ SOLN: 0.0000 [IU] | Freq: Three times a day (TID) | INTRAMUSCULAR | Status: DC

## 2022-12-22 MED ORDER — GLUCAGON HCL RDNA (DIAGNOSTIC) 1 MG IJ SOLR
4.0000 mg | Freq: Once | INTRAVENOUS | Status: DC
  Filled 2022-12-22: qty 4

## 2022-12-22 MED ORDER — ATROPINE SULFATE 1 MG/10ML IJ SOSY
1.0000 mg | PREFILLED_SYRINGE | Freq: Once | INTRAMUSCULAR | Status: AC
  Administered 2022-12-22: 1 mg via INTRAVENOUS
  Filled 2022-12-22: qty 10

## 2022-12-22 MED ORDER — SODIUM BICARBONATE 650 MG PO TABS
650.0000 mg | ORAL_TABLET | Freq: Three times a day (TID) | ORAL | Status: DC
  Administered 2022-12-22 – 2022-12-24 (×5): 650 mg via ORAL
  Filled 2022-12-22 (×6): qty 1

## 2022-12-22 MED ORDER — SODIUM CHLORIDE 0.9 % IV BOLUS
1000.0000 mL | Freq: Once | INTRAVENOUS | Status: AC
  Administered 2022-12-22: 1000 mL via INTRAVENOUS

## 2022-12-22 MED ORDER — INSULIN ASPART 100 UNIT/ML IJ SOLN: 0.0000 [IU] | Freq: Every day | INTRAMUSCULAR | Status: DC

## 2022-12-22 MED ORDER — ACETAMINOPHEN 325 MG PO TABS
650.0000 mg | ORAL_TABLET | Freq: Four times a day (QID) | ORAL | Status: DC | PRN
  Administered 2022-12-23 (×3): 650 mg via ORAL
  Filled 2022-12-22 (×3): qty 2

## 2022-12-22 NOTE — H&P (Addendum)
PCP:   Chilton Si, MD   Chief Complaint:  Generalized weakness  HPI: This is a pleasant 82 year old female with past medical history of HLD, HTN, T2DM,  hypothyroidism, GERD,  sleep apnea not on CPAP.  Per patient she has recently started seeing cardiology because she is having trouble breathing.  She denies chest pains just breathing issues.  She had a 2D echo and an cardiac CT ordered.  2 days ago she went for a cardiac CT.  She followed instructions, she took metoprolol tartrate 100 mg x 1 and prednisone.  She held her usual Coreg dose and took all her other medications including diltiazem ER 180 mg capsule.  The next day on the weakness 6:30 AM she took her normal medications including Coreg 3.25 mg and diltiazem ER.  She woke around noon and felt she was too weak, she could not get up and she had more difficulty breathing.  She denies pain when she was fine able to get up she was dizzy and had to use a walker.  She had a bad case of diarrhea and was quite disoriented.  911 was called and she was sent to [facility] ER.  At [facility] patient's heart rate was in the 30s.  Relatively hemodynamically stable.  Patient's heart rate had slowly improved.  Cardiology fellow on-call was contacted, patient transferred to [facility].  En route to the hospital patient again became bradycardic to 30s, systolic blood pressure dropped to an unknown number.  She was given 1 round of atropine and a dose of calcium gluconate.  On the floor patient's heart rate 50-53.  Patient hypotensive with variable blood pressure.  Eventually excepting a MAP of 44.  At this point patient asymptomatic.  Review of Systems:  Per HPI  Past Medical History: Past Medical History:  Diagnosis Date   Allergic rhinitis    Anxiety    Arthritis    Chronic cough    Dr Sherene Sires, onset 11/2007, Sinus CT July 7,2010>neg, Resolved 10/2008 on ppi two times a day   Colon polyp    Depression    Diabetes mellitus  type II    diet   Diverticulosis    Dyslipidemia    GERD (gastroesophageal reflux disease)    Hiatal hernia    Hypertension    Hypothyroidism    Lung nodule    Obesity (BMI 30-39.9)    Pneumonia    Sleep apnea    Dr Shelle Iron CPAP 2009   Past Surgical History:  Procedure Laterality Date   ABDOMINAL HYSTERECTOMY     APPENDECTOMY     BLADDER SUSPENSION     CHOLECYSTECTOMY     1998 in [facility]   EYE SURGERY  10/13   and 12/13; cataract surgery   Left breast lumpectomy     NISSEN FUNDOPLICATION     March '11 Dr Michaell Cowing   RECTOCELE REPAIR     TONSILLECTOMY     x 2   VENTRAL HERNIA REPAIR      Medications: Prior to Admission medications   Medication Sig Start Date End Date Taking? Authorizing Provider  AMBULATORY NON FORMULARY MEDICATION Folding walker with front wheels.  Use as needed. Disp 1 R26.89 05/22/21   Rodolph Bong, MD  AMBULATORY NON FORMULARY MEDICATION Folding walker with front wheels. Dispense 1 Dx code: R26.89 Use as needed 06/21/21   Rodolph Bong, MD  aspirin EC 325 MG tablet Take 1 tablet (325 mg total) by mouth daily. 11/20/21  Horton, Mayer Masker, MD  Blood Glucose Calibration (ACCU-CHEK GUIDE CONTROL) LIQD 1 Act by In Vitro route 2 (two) times daily as needed. 04/14/21   [name], MD  Blood Glucose Monitoring Suppl (ACCU-CHEK GUIDE ME) w/Device KIT 1 Act by Does not apply route 2 (two) times daily as needed. 04/14/21   [name], MD  carvedilol (COREG) 3.125 MG tablet Take 1 tablet (3.125 mg total) by mouth 2 (two) times daily with a meal. 11/29/22   [name], MD  diclofenac Sodium (VOLTAREN) 1 % GEL Apply 4 g topically 4 (four) times daily. 05/11/20   Rodolph Bong, MD  diltiazem (TIADYLT ER) 180 MG 24 hr capsule Take 1 capsule (180 mg total) by mouth daily. 11/29/22   [name], MD  empagliflozin (JARDIANCE) 10 MG TABS tablet Take 1 tablet (10 mg total) by mouth daily before breakfast. 11/29/22   [name], MD  FLUoxetine (PROZAC) 40 MG  capsule Take 1 capsule (40 mg total) by mouth daily. 11/29/22   [name], MD  glucose blood (ACCU-CHEK GUIDE) test strip 1 each by Other route 2 (two) times daily. 04/14/21   [name], MD  levothyroxine (SYNTHROID) 50 MCG tablet Take 1 tablet (50 mcg total) by mouth daily. 08/30/22   [name], MD  losartan (COZAAR) 100 MG tablet Take 1 tablet (100 mg total) by mouth daily. 11/29/22   [name], MD  memantine (NAMENDA XR) 28 MG CP24 24 hr capsule Take 1 capsule (28 mg total) by mouth daily. 09/25/22   [name], MD  metoprolol tartrate (LOPRESSOR) 100 MG tablet Take 1 tablet (100mg ) TWO hours prior to CT scan. HOLD CARVEDILOL MORNING OF CT 12/13/22   Chilton Si, MD  predniSONE (DELTASONE) 50 MG tablet Take 1 tablet 13 hours prior to test, then one 7 hours prior to test, and then one 1 hour prior to test. 12/13/22   Chilton Si, MD  rosuvastatin (CRESTOR) 10 MG tablet Take 1 tablet (10 mg total) by mouth daily. 11/29/22   [name], MD  spironolactone (ALDACTONE) 25 MG tablet Take 1 tablet (25 mg total) by mouth daily. 12/13/22   Chilton Si, MD    Allergies:   Allergies  Allergen Reactions   Amoxicillin Other (See Comments)    GI Issues.   Codeine Sulfate    Hydrochlorothiazide    Iodine    Iohexol      Code: HIVES, Desc: PATIENT STATES SHE BREAKS OUT IN HIVES FROM IV DYE 02/27/08/RM, Onset Date: [id number]    Paroxetine    Penicillins    Prednisone     Social History:  reports that she quit smoking about 41 years ago. Her smoking use included cigarettes. She started smoking about 66 years ago. She has a 37.5 pack-year smoking history. She has never used smokeless tobacco. She reports that she does not drink alcohol and does not use drugs.  Family History: Family History  Problem Relation Age of Onset   Stroke Mother 62   Lung cancer Father    Breast cancer Neg Hx     Physical Exam: Vitals:   12/21/22 2345 12/22/22 0000 12/22/22  0015 12/22/22 0030  BP:  (!) 95/53  (!) 84/52  Pulse: (!) 25 (!) 32 (!) 38   Resp: 20 17 10 17   Temp:      TempSrc:      SpO2: (!) 57% 99% (!) 84%   Weight:  Height:        General:  A&O x3, well developed and nourished, no acute distress Eyes: PERRLA, pink conjunctiva, no scleral icterus ENT: Moist oral mucosa, neck supple, no thyromegaly Lungs: clear to ascultation, no wheeze, no crackles, no use of accessory muscles Cardiovascular: Bradycardia, RRR, no regurgitation, no gallops, no murmurs. No carotid bruits, no JVD Abdomen: soft, positive BS, non-tender, non-distended, no organomegaly, not an acute abdomen GU: not examined Neuro: CN II - XII grossly intact, sensation intact Musculoskeletal: strength 5/5 all extremities, no clubbing, cyanosis or edema Skin: no rash, no subcutaneous crepitation, no decubitus Psych: appropriate patient   Labs on Admission:  Recent Labs    12/20/22 1145 12/21/22 2230  NA  --  139  K  --  5.3*  CL  --  109  CO2  --  19*  GLUCOSE  --  131*  BUN  --  27*  CREATININE 0.90 1.46*  CALCIUM  --  9.0  MG  --  2.5*   Recent Labs    12/21/22 2230  AST 24  ALT 22  ALKPHOS 68  BILITOT 0.4  PROT 6.9  ALBUMIN 4.1    Recent Labs    12/21/22 2230  WBC 13.6*  NEUTROABS 8.3*  HGB 12.3  HCT 38.0  MCV 91.3  PLT 232    Radiological Exams on Admission: DG Chest Portable 1 View  Result Date: 12/22/2022 CLINICAL DATA:  Shortness of breath and dizziness EXAM: PORTABLE CHEST 1 VIEW COMPARISON:  Radiograph 11/20/2021 FINDINGS: Stable cardiomediastinal silhouette. Low lung volumes accentuate pulmonary vascularity. Defibrillator pads overlie the left chest. No focal consolidation, pleural effusion, or pneumothorax. No displaced rib fractures. IMPRESSION: No active disease. Electronically Signed   By: Minerva Fester M.D.   On: 12/22/2022 00:18   CT CORONARY MORPH W/CTA COR W/SCORE W/CA W/CM &/OR WO/CM  Result Date: 12/20/2022 HISTORY:  Dyspnea on exertion (DOE) EXAM: Cardiac/Coronary  CT TECHNIQUE: The patient was scanned on a [employer]. PROTOCOL: A non-contrast, gated CT scan was obtained with axial slices of 3 mm through the heart for calcium scoring. Calcium scoring was performed using the Agatston method. A 120 kV prospective, gated, contrast cardiac scan was obtained. Gantry rotation speed was 250 msecs and collimation was 0.6 mm. Two sublingual nitroglycerin tablets (0.8 mg) were given. The 3D data set was reconstructed in 5% intervals of the 35-75% of the R-R cycle. Diastolic phases were analyzed on a dedicated workstation using MPR, MIP, and VRT modes. The patient received 95 cc of contrast. FINDINGS: Image quality: Poor. Artifact: Moderate (slab and breathing artifacts). Coronary artery calcification score: Left main: 0 Left anterior descending artery: 53.7 Left circumflex artery: 16.5 Right coronary artery: 21.9 Total coronary calcium score of 92.1, places the patient at the 58th percentile for age and sex matched control. Coronary arteries: Normal coronary origins.  Right dominance. Left Main Coronary Artery: Normal caliber vessel, originates from the left coronary cusp, bifurcates to form a left anterior descending artery and a left circumflex artery. There is no plaque or stenosis. Left Anterior Descending Coronary Artery: Normal caliber vessel, reaches the apex, gives off 2 diagonal branches. The ostial LAD contains minimal calcified plaque (0-24%) and minimal luminal irregularities within proximal to mid segment. Mid to apical segments are patent overall but accuracy limited due to artifact. First Diagonal branch: Patent. Second Diagonal branch: Patent. Left Circumflex Artery: Normal caliber vessel, non-dominant, travels within the atrioventricular groove and tapers off. It gives off 1 patent obtuse  marginal branch. The proximal LCX contains mild mixed plaque (25-49%) and mid to distal vessel tapers off. First Obtuse  Marginal branch: Normal caliber, large size, overall patent. Right Coronary Artery: Dominant vessel, originates from the right coronary cusp, terminates as a PDA and right posterolateral branch. RCA is grossly patent with minimal luminal irregularities due to soft and calcified plaque, but overall accuracy is limited due to artifacts. PDA is not well visualized. Left Atrium: Grossly normal in size with no left atrial appendage filling defect. Left Ventricle: Grossly normal in size. There are no stigmata of prior infarction. There is no abnormal filling defect. Pulmonary arteries: No proximal filling defect. Pulmonary veins: Normal pulmonary venous drainage (three veins on the right and two veins on the left). Aorta: Normal size, 27.5 mm at the mid ascending aorta (level of the PA bifurcation) measured double oblique. Aortic atherosclerosis. No dissection. Pericardium: Normal thickness with no significant effusion or calcium present. Cardiac valves: The aortic valve is trileaflet without significant calcification. The mitral valve is normal structure without significant calcification. Extra-cardiac findings: See attached radiology report for non-cardiac structures. IMPRESSION: 1. Coronary calcium score of 92.1. This was 58th percentile for age-, sex, and race-matched controls. 2. Normal coronary origin with right dominance. 3. Aortic atherosclerosis. 4. CAD-RADS = 2 Mild non-obstructive CAD. 5. Presence of both slab and breathing artifacts may effect the overall accuracy of the study. The study did not pass the quality control protocol for it to be sent to [facility] for plaque analysis, roadmap, or CT-FFR. RECOMMENDATIONS: Consider non-atherosclerotic causes of chest pain. Consider preventive therapy and risk factor modification. Electronically Signed   By: Tessa Lerner D.O.   On: 12/20/2022 14:40    Assessment/Plan Present on Admission:  Bradycardia -Secondary to beta-blocker and calcium channel  overdose. -MAP currently 44.  Five mg IV glucagon given.  1L NS bolus.  MAP improved to 84 -Cardiology consult placed.  He recommends an additional 4 of glucagon if needed -On chart review patient's baseline heart rate 53-60 in the home medications of Coreg 3.25 mg and diltiazem ER 180 mg daily.  Patient's only normal heart rate was during her cardiology visit where her heart rate was 72 -Currently holding Coreg and diltiazem -As needed hydralazine ordered.  Losartan on hold -PT consult placed  Atrial fibrillation -Initial EKG shows atrial fibrillation HR 37.  Follow-up EKG junctional rhythm HR 37.  Patient with no prior history of atrial fibrillation.  TSH ordered.  2D echo ordered.  No additional follow-up as this may be an isolated incident based on beta-blocker and calcium channel blocker overdose. -Cardiology on board   HTN -Losartan spironolactone resumed.  Coreg and diltiazem on hold -As needed hydralazine   GAD (generalized anxiety disorder)//mild dementia -Patient's Namenda and Prozac resumed   Hypothyroidism -Synthroid resumed   Chronic heart failure with preserved ejection fraction (HCC) -Crestor resumed.  Losartan, spironolactone resumed with hold parameters -Coreg on hold   Type II diabetes mellitus with manifestations (HCC) -Diet controlled   Hyperlipidemia with target LDL less than 100 -Crestor resumed  Franz Svec 12/22/2022, 1:37 AM

## 2022-12-22 NOTE — ED Provider Notes (Signed)
[name]: [id number] Arrival date & time: 12/21/22  2157     History  Chief Complaint  Patient presents with   Shortness of Breath   Dizziness    [name] is a 82 y.o. female with a past medical history of heart failure with preserved ejection fraction and hypertension who presents to the ED for shortness of breath.  Patient reported shortness of breath to her family members earlier tonight.  Her niece noted her to become short of breath after walking short distances at home and became concerned.  Patient does note diarrhea beginning earlier today.  She was recently started on spironolactone.  No shortness of breath at rest.  Denies chest pain, palpitations, fevers, chills or recent illness.  No nausea vomiting or diaphoresis.  She recently underwent CT of the coronary arteries yesterday and is followed by Dr. Duke Salvia and cardiology.   Shortness of Breath Dizziness Associated symptoms: shortness of breath        Home Medications Prior to Admission medications   Medication Sig Start Date End Date Taking? Authorizing Provider  AMBULATORY NON FORMULARY MEDICATION Folding walker with front wheels.  Use as needed. Disp 1 R26.89 05/22/21   Rodolph Bong, MD  AMBULATORY NON FORMULARY MEDICATION Folding walker with front wheels. Dispense 1 Dx code: R26.89 Use as needed 06/21/21   Rodolph Bong, MD  aspirin EC 325 MG tablet Take 1 tablet (325 mg total) by mouth daily. 11/20/21   Horton, Mayer Masker, MD  Blood Glucose Calibration (ACCU-CHEK GUIDE CONTROL) LIQD 1 Act by In Vitro route 2 (two) times daily as needed. 04/14/21   [name], MD  Blood Glucose Monitoring Suppl (ACCU-CHEK GUIDE ME) w/Device KIT 1 Act by Does not apply route 2 (two) times daily as needed. 04/14/21   [name], MD  carvedilol (COREG) 3.125 MG tablet Take 1 tablet (3.125 mg total) by mouth 2 (two) times daily with a meal. 11/29/22   [name], MD  diclofenac Sodium (VOLTAREN) 1 % GEL Apply 4 g topically 4 (four) times daily. 05/11/20   Rodolph Bong, MD  diltiazem (TIADYLT ER) 180 MG 24 hr capsule Take 1 capsule (180 mg total) by mouth daily. 11/29/22   [name], MD  empagliflozin (JARDIANCE) 10 MG TABS tablet Take 1 tablet (10 mg total) by mouth daily before breakfast. 11/29/22   [name], MD  FLUoxetine (PROZAC) 40 MG capsule Take 1 capsule (40 mg total) by mouth daily. 11/29/22   [name], MD  glucose blood (ACCU-CHEK GUIDE) test strip 1 each by Other route 2 (two) times daily. 04/14/21   [name], MD  levothyroxine (SYNTHROID) 50 MCG tablet Take 1 tablet (50 mcg total) by mouth daily. 08/30/22   [name], MD  losartan (COZAAR) 100 MG tablet Take 1 tablet (100 mg total) by mouth daily. 11/29/22   [name], MD  memantine (NAMENDA XR) 28 MG CP24 24 hr capsule Take 1 capsule (28 mg total) by mouth daily. 09/25/22   [name], MD  metoprolol tartrate (LOPRESSOR) 100 MG tablet Take 1 tablet (100mg ) TWO hours prior to CT scan. HOLD CARVEDILOL MORNING OF CT 12/13/22   Chilton Si, MD  predniSONE (DELTASONE) 50 MG tablet Take 1 tablet 13 hours prior to test, then one 7 hours prior to test, and then one 1 hour prior to test. 12/13/22   Duke Salvia,  Tiffany, MD  rosuvastatin (CRESTOR) 10 MG tablet Take 1 tablet (10 mg total) by mouth daily. 11/29/22   [name], MD  spironolactone (ALDACTONE) 25 MG tablet Take 1 tablet (25 mg total) by mouth daily. 12/13/22   Chilton Si, MD      Allergies    Amoxicillin, Codeine sulfate, Hydrochlorothiazide, Iodine, Iohexol, Paroxetine, Penicillins, and Prednisone    Review of Systems   Review of Systems  Respiratory:  Positive for shortness of breath.   Neurological:  Positive for dizziness.    Physical Exam Updated Vital Signs BP (!) 84/52   Pulse (!) 38   Temp (!) 97.3 F (36.3 C) (Oral)   Resp 17   Ht 4\' 11"  (1.499 m)   Wt 71.2 kg    SpO2 (!) 84%   BMI 31.71 kg/m  Physical Exam Vitals and nursing note reviewed.  HENT:     Head: Normocephalic and atraumatic.  Eyes:     Pupils: Pupils are equal, round, and reactive to light.  Cardiovascular:     Rate and Rhythm: Regular rhythm. Bradycardia present.  Pulmonary:     Effort: Pulmonary effort is normal.     Breath sounds: Normal breath sounds.  Abdominal:     Palpations: Abdomen is soft.     Tenderness: There is no abdominal tenderness.  Skin:    General: Skin is warm and dry.  Neurological:     General: No focal deficit present.     Mental Status: She is alert and oriented to person, place, and time.     Motor: No weakness.  Psychiatric:        Mood and Affect: Mood normal.     ED Results / Procedures / Treatments   Labs (all labs ordered are listed, but only abnormal results are displayed) Labs Reviewed  COMPREHENSIVE METABOLIC PANEL - Abnormal; Notable for the following components:      Result Value   Potassium 5.3 (*)    CO2 19 (*)    Glucose, Bld 131 (*)    BUN 27 (*)    Creatinine, Ser 1.46 (*)    GFR, Estimated 35 (*)    All other components within normal limits  CBC WITH DIFFERENTIAL/PLATELET - Abnormal; Notable for the following components:   WBC 13.6 (*)    Neutro Abs 8.3 (*)    Monocytes Absolute 1.1 (*)    Abs Immature Granulocytes 0.74 (*)    All other components within normal limits  MAGNESIUM - Abnormal; Notable for the following components:   Magnesium 2.5 (*)    All other components within normal limits  BRAIN NATRIURETIC PEPTIDE - Abnormal; Notable for the following components:   B Natriuretic Peptide 362.0 (*)    All other components within normal limits  TROPONIN I (HIGH SENSITIVITY)  TROPONIN I (HIGH SENSITIVITY)    EKG EKG Interpretation Date/Time:  Friday December 21 2022 22:16:41 EDT Ventricular Rate:  37 PR Interval:    QRS Duration:  88 QT Interval:  508 QTC Calculation: 399 R Axis:   22  Text  Interpretation: Junctional bradycardia Confirmed by Estelle June [phone]) on 12/22/2022 1:04:10 AM  Radiology DG Chest Portable 1 View  Result Date: 12/22/2022 CLINICAL DATA:  Shortness of breath and dizziness EXAM: PORTABLE CHEST 1 VIEW COMPARISON:  Radiograph 11/20/2021 FINDINGS: Stable cardiomediastinal silhouette. Low lung volumes accentuate pulmonary vascularity. Defibrillator pads overlie the left chest. No focal consolidation, pleural effusion, or pneumothorax. No displaced rib fractures. IMPRESSION: No active disease. Electronically Signed  By: Minerva Fester M.D.   On: 12/22/2022 00:18   CT CORONARY MORPH W/CTA COR W/SCORE W/CA W/CM &/OR WO/CM  Result Date: 12/20/2022 HISTORY: Dyspnea on exertion (DOE) EXAM: Cardiac/Coronary  CT TECHNIQUE: The patient was scanned on a [employer]. PROTOCOL: A non-contrast, gated CT scan was obtained with axial slices of 3 mm through the heart for calcium scoring. Calcium scoring was performed using the Agatston method. A 120 kV prospective, gated, contrast cardiac scan was obtained. Gantry rotation speed was 250 msecs and collimation was 0.6 mm. Two sublingual nitroglycerin tablets (0.8 mg) were given. The 3D data set was reconstructed in 5% intervals of the 35-75% of the R-R cycle. Diastolic phases were analyzed on a dedicated workstation using MPR, MIP, and VRT modes. The patient received 95 cc of contrast. FINDINGS: Image quality: Poor. Artifact: Moderate (slab and breathing artifacts). Coronary artery calcification score: Left main: 0 Left anterior descending artery: 53.7 Left circumflex artery: 16.5 Right coronary artery: 21.9 Total coronary calcium score of 92.1, places the patient at the 58th percentile for age and sex matched control. Coronary arteries: Normal coronary origins.  Right dominance. Left Main Coronary Artery: Normal caliber vessel, originates from the left coronary cusp, bifurcates to form a left anterior descending artery and a  left circumflex artery. There is no plaque or stenosis. Left Anterior Descending Coronary Artery: Normal caliber vessel, reaches the apex, gives off 2 diagonal branches. The ostial LAD contains minimal calcified plaque (0-24%) and minimal luminal irregularities within proximal to mid segment. Mid to apical segments are patent overall but accuracy limited due to artifact. First Diagonal branch: Patent. Second Diagonal branch: Patent. Left Circumflex Artery: Normal caliber vessel, non-dominant, travels within the atrioventricular groove and tapers off. It gives off 1 patent obtuse marginal branch. The proximal LCX contains mild mixed plaque (25-49%) and mid to distal vessel tapers off. First Obtuse Marginal branch: Normal caliber, large size, overall patent. Right Coronary Artery: Dominant vessel, originates from the right coronary cusp, terminates as a PDA and right posterolateral branch. RCA is grossly patent with minimal luminal irregularities due to soft and calcified plaque, but overall accuracy is limited due to artifacts. PDA is not well visualized. Left Atrium: Grossly normal in size with no left atrial appendage filling defect. Left Ventricle: Grossly normal in size. There are no stigmata of prior infarction. There is no abnormal filling defect. Pulmonary arteries: No proximal filling defect. Pulmonary veins: Normal pulmonary venous drainage (three veins on the right and two veins on the left). Aorta: Normal size, 27.5 mm at the mid ascending aorta (level of the PA bifurcation) measured double oblique. Aortic atherosclerosis. No dissection. Pericardium: Normal thickness with no significant effusion or calcium present. Cardiac valves: The aortic valve is trileaflet without significant calcification. The mitral valve is normal structure without significant calcification. Extra-cardiac findings: See attached radiology report for non-cardiac structures. IMPRESSION: 1. Coronary calcium score of 92.1. This was 58th  percentile for age-, sex, and race-matched controls. 2. Normal coronary origin with right dominance. 3. Aortic atherosclerosis. 4. CAD-RADS = 2 Mild non-obstructive CAD. 5. Presence of both slab and breathing artifacts may effect the overall accuracy of the study. The study did not pass the quality control protocol for it to be sent to [facility] for plaque analysis, roadmap, or CT-FFR. RECOMMENDATIONS: Consider non-atherosclerotic causes of chest pain. Consider preventive therapy and risk factor modification. Electronically Signed   By: Tessa Lerner D.O.   On: 12/20/2022 14:40    Procedures Procedures  Medications Ordered in ED Medications  sodium chloride 0.9 % bolus 500 mL (0 mLs Intravenous Stopped 12/21/22 2351)  sodium zirconium cyclosilicate (LOKELMA) packet 10 g (10 g Oral Given 12/21/22 2337)  calcium gluconate 1 g/ 50 mL sodium chloride IVPB (0 mg Intravenous Stopped 12/21/22 2351)  atropine 1 MG/10ML injection 1 mg (1 mg Intravenous Given 12/22/22 0029)    ED Course/ Medical Decision Making/ A&P Clinical Course as of 12/22/22 0104  Fri Dec 21, 2022  2330 Laboratory workup notable for hyperkalemia 5.3.  Ordered calcium gluconate and Lokelma.  Discussed with admitting hospitalist who accepts patient for admission [MP]  Sat Dec 22, 2022  0020 Patient's blood pressure has downtrended her. She continues to [facility] well. Administered 1mg  atropine which improved her heart rate and blood pressure. Stable for discharge with transport team to [facility]. She will be placed in a PCU bed. [MP]    Clinical Course User Index [MP] Royanne Foots, DO                                 Medical Decision Making 82 year old female with history as above presenting for shortness of breath and dyspnea on exertion.  Initial vital signs notable for bradycardia with rate between the 30s and 40s and normotension.  On my initial assessment patient is mentating well and reports shortness of breath with exertion but  no current chest pain or palpitations.  Initial EKG shows junctional bradycardia.  No evidence of heart block.  We have placed the patient on defibrillator pads as a precaution.  Discussed with cardiologist Dr.Skains, who recommends admission to [facility] to medicine service, holding diltiazem and carvedilol until bradycardia has resolved and completion of ACS workup..  Given that she is not hypotensive and is mentating well at this time no role for atropine or other intervention.  Will provide her with 500 cc IV fluid bolus with reported history of diarrhea.  We have considered hyperkalemia as to the underlying etiology for her bradycardia but have a relatively low suspicion given she has no history of severe kidney disease.  Will treat hyperkalemia if her laboratory workup indicates  Amount and/or Complexity of Data Reviewed Labs: ordered. Radiology: ordered.  Risk Prescription drug management. Decision regarding hospitalization.           Final Clinical Impression(s) / ED Diagnoses Final diagnoses:  Junctional bradycardia  Heart failure with preserved ejection fraction, unspecified HF chronicity (HCC)  Hyperkalemia    Rx / DC Orders ED Discharge Orders     None         Royanne Foots, DO 12/22/22 0104

## 2022-12-22 NOTE — Plan of Care (Signed)
  Problem: Clinical Measurements: Goal: Ability to maintain clinical measurements within normal limits will improve Outcome: Progressing   

## 2022-12-22 NOTE — Consult Note (Signed)
Cardiology Consultation   Patient ID: [name] MRN: [id number]; DOB: [birth year]  Admit date: 12/21/2022 Date of Consult: 12/22/2022  PCP:  Chilton Si, MD   [facility] Providers Cardiologist:  Lance Muss, MD   new Dr. Duke Salvia patient   Patient Profile:   [name] is a 82 y.o. female with a hx of diabetes hypertension hyperlipidemia chronic diastolic heart failure who is being seen 12/22/2022 for the evaluation of severe bradycardia at the request of Dr Elayne Snare.  History of Present Illness:   [name] is an 82 year old female with diabetes hypertension hyperlipidemia chronic diastolic heart failure with ejection fraction 60 to 65% on echocardiogram in 2022 who recently had a coronary CT scan done yesterday that showed mild nonobstructive coronary artery disease at the request of Dr. Chilton Si who has assumed care since the departure of Dr. Eldridge Dace.  She called the answering service on 12/21/2022 evening feeling lethargic fatigued short of breath.  She did complain of some diarrhea as well.  No chest pain.  She called EMS and was sent to the [facility] emergency department.  She was found to be in junctional bradycardia with heart rates in the 30s.  Atropine as well as IV glucagon was administered.  She was on both diltiazem 180 sustained-release as well as carvedilol 3.125 mg at home and she did receive 1 dose of metoprolol 100 mg yesterday for her CT scan of the heart.  Recent start spironolactone.  Potassium 5.3 on arrival  Overnight her heart rate has improved and is currently in the 80s demonstrating sinus rhythm with first-degree AV block.  She is pleasant no chest pain.  Shortness of breath seems to be improved she states.  She is laying flat comfortably in bed.  She denies any syncopal episodes.  ZOLL pads in place   Past Medical History:  Diagnosis Date   Allergic rhinitis    Anxiety    Arthritis    Chronic cough    Dr Sherene Sires,  onset 11/2007, Sinus CT July 7,2010>neg, Resolved 10/2008 on ppi two times a day   Colon polyp    Depression    Diabetes mellitus type II    diet   Diverticulosis    Dyslipidemia    GERD (gastroesophageal reflux disease)    Hiatal hernia    Hypertension    Hypothyroidism    Lung nodule    Obesity (BMI 30-39.9)    Pneumonia    Sleep apnea    Dr Shelle Iron CPAP 2009    Past Surgical History:  Procedure Laterality Date   ABDOMINAL HYSTERECTOMY     APPENDECTOMY     BLADDER SUSPENSION     [facility]     [facility]   EYE SURGERY  10/13   and 12/13; cataract surgery   Left breast lumpectomy     NISSEN FUNDOPLICATION     March '11 Dr Michaell Cowing   RECTOCELE REPAIR     TONSILLECTOMY     x 2   VENTRAL HERNIA REPAIR       Home Medications:  Prior to Admission medications   Medication Sig Start Date End Date Taking? Authorizing Provider  AMBULATORY NON FORMULARY MEDICATION Folding walker with front wheels.  Use as needed. Disp 1 R26.89 05/22/21   Rodolph Bong, MD  AMBULATORY NON FORMULARY MEDICATION Folding walker with front wheels. Dispense 1 Dx code: R26.89 Use as needed 06/21/21   Rodolph Bong, MD  aspirin EC 325 MG tablet Take 1 tablet (  325 mg total) by mouth daily. 11/20/21   Horton, Mayer Masker, MD  Blood Glucose Calibration (ACCU-CHEK GUIDE CONTROL) LIQD 1 Act by In Vitro route 2 (two) times daily as needed. 04/14/21   [name], MD  Blood Glucose Monitoring Suppl (ACCU-CHEK GUIDE ME) w/Device KIT 1 Act by Does not apply route 2 (two) times daily as needed. 04/14/21   [name], MD  carvedilol (COREG) 3.125 MG tablet Take 1 tablet (3.125 mg total) by mouth 2 (two) times daily with a meal. 11/29/22   [name], MD  diclofenac Sodium (VOLTAREN) 1 % GEL Apply 4 g topically 4 (four) times daily. 05/11/20   Rodolph Bong, MD  diltiazem (TIADYLT ER) 180 MG 24 hr capsule Take 1 capsule (180 mg total) by mouth daily. 11/29/22   [name], MD  empagliflozin  (JARDIANCE) 10 MG TABS tablet Take 1 tablet (10 mg total) by mouth daily before breakfast. 11/29/22   [name], MD  FLUoxetine (PROZAC) 40 MG capsule Take 1 capsule (40 mg total) by mouth daily. 11/29/22   [name], MD  glucose blood (ACCU-CHEK GUIDE) test strip 1 each by Other route 2 (two) times daily. 04/14/21   [name], MD  levothyroxine (SYNTHROID) 50 MCG tablet Take 1 tablet (50 mcg total) by mouth daily. 08/30/22   [name], MD  losartan (COZAAR) 100 MG tablet Take 1 tablet (100 mg total) by mouth daily. 11/29/22   [name], MD  memantine (NAMENDA XR) 28 MG CP24 24 hr capsule Take 1 capsule (28 mg total) by mouth daily. 09/25/22   [name], MD  metoprolol tartrate (LOPRESSOR) 100 MG tablet Take 1 tablet (100mg ) TWO hours prior to CT scan. HOLD CARVEDILOL MORNING OF CT 12/13/22   Chilton Si, MD  predniSONE (DELTASONE) 50 MG tablet Take 1 tablet 13 hours prior to test, then one 7 hours prior to test, and then one 1 hour prior to test. 12/13/22   Chilton Si, MD  rosuvastatin (CRESTOR) 10 MG tablet Take 1 tablet (10 mg total) by mouth daily. 11/29/22   [name], MD  spironolactone (ALDACTONE) 25 MG tablet Take 1 tablet (25 mg total) by mouth daily. 12/13/22   Chilton Si, MD    Inpatient Medications: Scheduled Meds:  enoxaparin (LOVENOX) injection  30 mg Subcutaneous Daily   Continuous Infusions:  PRN Meds: acetaminophen **OR** acetaminophen, hydrALAZINE  Allergies:    Allergies  Allergen Reactions   Amoxicillin Other (See Comments)    GI Issues.   Codeine Sulfate    Hydrochlorothiazide    Iodine    Iohexol      Code: HIVES, Desc: PATIENT STATES SHE BREAKS OUT IN HIVES FROM IV DYE 02/27/08/RM, Onset Date: [id number]    Paroxetine    Penicillins    Prednisone     Social History:   Social History   Socioeconomic History   Marital status: Single    Spouse name: Not on file   Number of children: Not on file   Years of  education: Not on file   Highest education level: Not on file  Occupational History   Occupation: retired Engineer, petroleum  Tobacco Use   Smoking status: Former    Current packs/day: 0.00    Average packs/day: 1.5 packs/day for 25.0 years (37.5 ttl pk-yrs)    Types: Cigarettes    Start date: 10/28/1956    Quit date: 10/28/1981    Years since quitting:  41.1   Smokeless tobacco: Never  Substance and Sexual Activity   Alcohol use: No   Drug use: No   Sexual activity: Not Currently  Other Topics Concern   Not on file  Social History Narrative   HSG. Married -divorced. 2 sons. 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 Patient does not get regular exercise. Loves to read. Strong faith. Lives alone.   Social Determinants of Health   Financial Resource Strain: Low Risk  (12/18/2021)   Overall Financial Resource Strain (CARDIA)    Difficulty of Paying Living Expenses: Not hard at all  Food Insecurity: No Food Insecurity (12/22/2022)   Hunger Vital Sign    Worried About Running Out of Food in the Last Year: Never true    Ran Out of Food in the Last Year: Never true  Transportation Needs: No Transportation Needs (12/22/2022)   PRAPARE - Administrator, Civil Service (Medical): No    Lack of Transportation (Non-Medical): No  Physical Activity: Insufficiently Active (12/18/2021)   Exercise Vital Sign    Days of Exercise per Week: 3 days    Minutes of Exercise per Session: 30 min  Stress: No Stress Concern Present (12/18/2021)   [employer] of Occupational Health - Occupational Stress Questionnaire    Feeling of Stress : Not at all  Social Connections: Moderately Integrated (12/18/2021)   Social Connection and Isolation Panel [NHANES]    Frequency of Communication with Friends and Family: More than three times a week    Frequency of Social Gatherings with Friends and Family: More than three times a week    Attends Religious Services: More than 4 times per year     Active Member of [employer] or Organizations: Yes    Attends Engineer, structural: More than 4 times per year    Marital Status: Divorced  Intimate Partner Violence: Not At Risk (12/22/2022)   Humiliation, Afraid, Rape, and Kick questionnaire    Fear of Current or Ex-Partner: No    Emotionally Abused: No    Physically Abused: No    Sexually Abused: No    Family History:    Family History  Problem Relation Age of Onset   Stroke Mother 43   Lung cancer Father    Breast cancer Neg Hx      ROS:  Please see the history of present illness.  She denies any syncopal episodes All other ROS reviewed and negative.     Physical Exam/Data:   Vitals:   12/22/22 0400 12/22/22 0415 12/22/22 0500 12/22/22 0515  BP: (!) 142/75 126/64 (!) 121/57 120/69  Pulse: 74 80 78 79  Resp: (!) 23 18 18 17   Temp:      TempSrc:      SpO2: 96% 99% 96% 96%  Weight:      Height:        Intake/Output Summary (Last 24 hours) at 12/22/2022 0559 Last data filed at 12/22/2022 0400 Gross per 24 hour  Intake 1588.4 ml  Output --  Net 1588.4 ml      12/21/2022   10:11 PM 12/13/2022    8:56 AM 11/29/2022    1:02 PM  Last 3 Weights  Weight (lbs) 157 lb 157 lb 3.2 oz 153 lb  Weight (kg) 71.215 kg 71.305 kg 69.4 kg     Body mass index is 31.71 kg/m.  General:  Well nourished, well developed, in no acute distress laying flat HEENT: normal Neck: no JVD Vascular: No carotid bruits;  Distal pulses 2+ bilaterally Cardiac:  normal S1, S2; RRR; 1/6 systolic murmur  Lungs:  clear to auscultation bilaterally, no wheezing, rhonchi or rales  Abd: soft, nontender, no hepatomegaly  Ext: no edema Musculoskeletal:  No deformities, BUE and BLE strength normal and equal Skin: warm and dry  Neuro:  CNs 2-12 intact, no focal abnormalities noted Psych:  Normal affect   EKG:  The EKG was personally reviewed and demonstrates: Junctional bradycardia in the 30s on arrival  Telemetry:  Telemetry was personally reviewed  and demonstrates: Currently sinus rhythm first-degree AV block heart rate in the 80s  Relevant CV Studies: Echocardiogram pending, prior echo showed EF of 65% with aortic atherosclerosis  Laboratory Data:  High Sensitivity Troponin:   Recent Labs  Lab 12/21/22 2230  TROPONINIHS 6     Chemistry Recent Labs  Lab 12/20/22 1145 12/21/22 2230  NA  --  139  K  --  5.3*  CL  --  109  CO2  --  19*  GLUCOSE  --  131*  BUN  --  27*  CREATININE 0.90 1.46*  CALCIUM  --  9.0  MG  --  2.5*  GFRNONAA  --  35*  ANIONGAP  --  11    Recent Labs  Lab 12/21/22 2230  PROT 6.9  ALBUMIN 4.1  AST 24  ALT 22  ALKPHOS 68  BILITOT 0.4   Lipids No results for input(s): "CHOL", "TRIG", "HDL", "LABVLDL", "LDLCALC", "CHOLHDL" in the last 168 hours.  Hematology Recent Labs  Lab 12/21/22 2230  WBC 13.6*  RBC 4.16  HGB 12.3  HCT 38.0  MCV 91.3  MCH 29.6  MCHC 32.4  RDW 15.5  PLT 232   Thyroid No results for input(s): "TSH", "FREET4" in the last 168 hours.  BNP Recent Labs  Lab 12/21/22 2230  BNP 362.0*    DDimer  Recent Labs  Lab 12/22/22 0336  DDIMER 13.72*     Radiology/Studies:  DG Chest Portable 1 View  Result Date: 12/22/2022 CLINICAL DATA:  Shortness of breath and dizziness EXAM: PORTABLE CHEST 1 VIEW COMPARISON:  Radiograph 11/20/2021 FINDINGS: Stable cardiomediastinal silhouette. Low lung volumes accentuate pulmonary vascularity. Defibrillator pads overlie the left chest. No focal consolidation, pleural effusion, or pneumothorax. No displaced rib fractures. IMPRESSION: No active disease. Electronically Signed   By: Minerva Fester M.D.   On: 12/22/2022 00:18   CT CORONARY MORPH W/CTA COR W/SCORE W/CA W/CM &/OR WO/CM  Result Date: 12/20/2022 HISTORY: Dyspnea on exertion (DOE) EXAM: Cardiac/Coronary  CT TECHNIQUE: The patient was scanned on a [employer]. PROTOCOL: A non-contrast, gated CT scan was obtained with axial slices of 3 mm through the heart for  calcium scoring. Calcium scoring was performed using the Agatston method. A 120 kV prospective, gated, contrast cardiac scan was obtained. Gantry rotation speed was 250 msecs and collimation was 0.6 mm. Two sublingual nitroglycerin tablets (0.8 mg) were given. The 3D data set was reconstructed in 5% intervals of the 35-75% of the R-R cycle. Diastolic phases were analyzed on a dedicated workstation using MPR, MIP, and VRT modes. The patient received 95 cc of contrast. FINDINGS: Image quality: Poor. Artifact: Moderate (slab and breathing artifacts). Coronary artery calcification score: Left main: 0 Left anterior descending artery: 53.7 Left circumflex artery: 16.5 Right coronary artery: 21.9 Total coronary calcium score of 92.1, places the patient at the 58th percentile for age and sex matched control. Coronary arteries: Normal coronary origins.  Right dominance. Left Main Coronary Artery:  Normal caliber vessel, originates from the left coronary cusp, bifurcates to form a left anterior descending artery and a left circumflex artery. There is no plaque or stenosis. Left Anterior Descending Coronary Artery: Normal caliber vessel, reaches the apex, gives off 2 diagonal branches. The ostial LAD contains minimal calcified plaque (0-24%) and minimal luminal irregularities within proximal to mid segment. Mid to apical segments are patent overall but accuracy limited due to artifact. First Diagonal branch: Patent. Second Diagonal branch: Patent. Left Circumflex Artery: Normal caliber vessel, non-dominant, travels within the atrioventricular groove and tapers off. It gives off 1 patent obtuse marginal branch. The proximal LCX contains mild mixed plaque (25-49%) and mid to distal vessel tapers off. First Obtuse Marginal branch: Normal caliber, large size, overall patent. Right Coronary Artery: Dominant vessel, originates from the right coronary cusp, terminates as a PDA and right posterolateral branch. RCA is grossly patent  with minimal luminal irregularities due to soft and calcified plaque, but overall accuracy is limited due to artifacts. PDA is not well visualized. Left Atrium: Grossly normal in size with no left atrial appendage filling defect. Left Ventricle: Grossly normal in size. There are no stigmata of prior infarction. There is no abnormal filling defect. Pulmonary arteries: No proximal filling defect. Pulmonary veins: Normal pulmonary venous drainage (three veins on the right and two veins on the left). Aorta: Normal size, 27.5 mm at the mid ascending aorta (level of the PA bifurcation) measured double oblique. Aortic atherosclerosis. No dissection. Pericardium: Normal thickness with no significant effusion or calcium present. Cardiac valves: The aortic valve is trileaflet without significant calcification. The mitral valve is normal structure without significant calcification. Extra-cardiac findings: See attached radiology report for non-cardiac structures. IMPRESSION: 1. Coronary calcium score of 92.1. This was 58th percentile for age-, sex, and race-matched controls. 2. Normal coronary origin with right dominance. 3. Aortic atherosclerosis. 4. CAD-RADS = 2 Mild non-obstructive CAD. 5. Presence of both slab and breathing artifacts may effect the overall accuracy of the study. The study did not pass the quality control protocol for it to be sent to [facility] for plaque analysis, roadmap, or CT-FFR. RECOMMENDATIONS: Consider non-atherosclerotic causes of chest pain. Consider preventive therapy and risk factor modification. Electronically Signed   By: Tessa Lerner D.O.   On: 12/20/2022 14:40     Assessment and Plan:   Junctional bradycardia -This has improved with holding both Cardizem 180 mg SR as well as carvedilol 3.125 mg twice a day and administration of IV glucagon -Currently sinus rhythm with first-degree AV block on telemetry.  EKG this morning pending. -She has had issues with tachycardia in the past hence  the reason for her being on both AV nodal blocking agents.  I am worried about the possibility of tachycardia-bradycardia syndrome.  If this occurs, she may be a pacemaker candidate.  For now, continue to hold both AV nodal blocking agents and monitor. -Recent TSH normal. -No urgent need for temporary pacing wire.  Mild nonobstructive coronary artery disease - CT scan performed yesterday on 12/20/2022 demonstrated calcium score of 92 which is 50th percentile, aortic atherosclerosis and mild nonobstructive CAD.  Continue with goal-directed medical therapy.  Aspirin 81 mg would be reasonable, Crestor 10 mg once a day as outpatient.  LDL 146 on 11/29/2022  Diabetes type 2 with hypertension - Appreciate primary team with management. -At home blood pressures are usually in the 120s over 90s -Holding new start spironolactone 25 mg secondary to elevated potassium, 5.3 -Okay to continue with losartan  and Jardiance 10 mg a day   Chronic diastolic heart failure - Previously right heart catheterization in 2013 showed normal right-sided pressures most recent echocardiogram showed normal ejection fraction.  We will repeat echocardiogram.  This has been ordered. -Jardiance. -Main perceived symptom is shortness of breath, BNP 362 troponin 6.  Acute kidney injury - Creatinine 1.46 on arrival, was 0.91 recently.  This could be secondary to decreased cardiac output in the setting of bradycardia.  Could also be from contrast load as well as recent administration of spironolactone.  Holding spironolactone.  Gentle fluid administration has been given.  We will follow along.  CRITICAL CARE Performed by: Donato Schultz   Total critical care time: 50 minutes  Critical care time was exclusive of separately billable procedures and treating other patients.  Critical care was necessary to treat or prevent imminent or life-threatening deterioration.  Critical care was time spent personally by me on the following  activities: development of treatment plan with patient and/or surrogate as well as nursing, discussions with consultants, evaluation of patient's response to treatment, examination of patient, obtaining history from patient or surrogate, ordering and performing treatments and interventions, ordering and review of laboratory studies, ordering and review of radiographic studies, pulse oximetry and re-evaluation of patient's condition.    Risk Assessment/Risk Scores:       New York Heart Association (NYHA) Functional Class NYHA Class III        For questions or updates, please contact [facility] Please consult [email or web address] for contact info under    Signed, Donato Schultz, MD  12/22/2022 5:59 AM

## 2022-12-22 NOTE — Progress Notes (Signed)
[name] Update:  I discussed patient's case again with EDP at [facility], Dr. Elayne Snare, regarding potential interval decrease in patient's BP. Dr. Elayne Snare re-evaluated the patient, recycling blood pressure, which now appears improved, with MAP's greater than 65 mmHg. Patient continues to [name] well.   Patient to be transferred to [place] service at [facility]. Will escalate level of care to PCU. Cardiology to formally consult upon arrival.    Newton Pigg, DO Hospitalist

## 2022-12-22 NOTE — Progress Notes (Signed)
Patient admitted earlier this morning for bradycardia.  Cardizem and Coreg on hold.  Cardiology following.  Patient seen and examined at bedside.  I have reviewed patient's medical records including this morning's H&P, current vitals, labs and medications myself.  Follow labs this morning.  Follow further cardiology recommendations.

## 2022-12-23 ENCOUNTER — Telehealth: Payer: Self-pay | Admitting: Physician Assistant

## 2022-12-23 DIAGNOSIS — R001 Bradycardia, unspecified: Secondary | ICD-10-CM

## 2022-12-23 LAB — CBC WITH DIFFERENTIAL/PLATELET
Abs Immature Granulocytes: 0.06 10*3/uL (ref 0.00–0.07)
Basophils Absolute: 0 10*3/uL (ref 0.0–0.1)
Basophils Relative: 0 %
Eosinophils Absolute: 0.2 10*3/uL (ref 0.0–0.5)
Eosinophils Relative: 2 %
HCT: 35.1 % — ABNORMAL LOW (ref 36.0–46.0)
Hemoglobin: 11.8 g/dL — ABNORMAL LOW (ref 12.0–15.0)
Immature Granulocytes: 1 %
Lymphocytes Relative: 27 %
Lymphs Abs: 2.1 10*3/uL (ref 0.7–4.0)
MCH: 30.3 pg (ref 26.0–34.0)
MCHC: 33.6 g/dL (ref 30.0–36.0)
MCV: 90 fL (ref 80.0–100.0)
Monocytes Absolute: 0.9 10*3/uL (ref 0.1–1.0)
Monocytes Relative: 11 %
Neutro Abs: 4.6 10*3/uL (ref 1.7–7.7)
Neutrophils Relative %: 59 %
Platelets: 179 10*3/uL (ref 150–400)
RBC: 3.9 MIL/uL (ref 3.87–5.11)
RDW: 14.9 % (ref 11.5–15.5)
WBC: 7.8 10*3/uL (ref 4.0–10.5)
nRBC: 0 % (ref 0.0–0.2)

## 2022-12-23 LAB — BASIC METABOLIC PANEL
Anion gap: 8 (ref 5–15)
BUN: 15 mg/dL (ref 8–23)
CO2: 20 mmol/L — ABNORMAL LOW (ref 22–32)
Calcium: 8.2 mg/dL — ABNORMAL LOW (ref 8.9–10.3)
Chloride: 111 mmol/L (ref 98–111)
Creatinine, Ser: 1.01 mg/dL — ABNORMAL HIGH (ref 0.44–1.00)
GFR, Estimated: 55 mL/min — ABNORMAL LOW (ref >60)
Glucose, Bld: 93 mg/dL (ref 70–99)
Potassium: 3.3 mmol/L — ABNORMAL LOW (ref 3.5–5.1)
Sodium: 139 mmol/L (ref 135–145)

## 2022-12-23 LAB — GLUCOSE, CAPILLARY
Glucose-Capillary: 94 mg/dL (ref 70–99)
Glucose-Capillary: 90 mg/dL (ref 70–99)
Glucose-Capillary: 95 mg/dL (ref 70–99)
Glucose-Capillary: 86 mg/dL (ref 70–99)

## 2022-12-23 LAB — MAGNESIUM: Magnesium: 2.2 mg/dL (ref 1.7–2.4)

## 2022-12-23 MED ORDER — LOSARTAN POTASSIUM 50 MG PO TABS
100.0000 mg | ORAL_TABLET | Freq: Every day | ORAL | Status: DC
  Administered 2022-12-23 – 2022-12-24 (×2): 100 mg via ORAL
  Filled 2022-12-23 (×2): qty 2

## 2022-12-23 MED ORDER — SODIUM BICARBONATE 650 MG PO TABS: 650.0000 mg | ORAL_TABLET | Freq: Three times a day (TID) | ORAL | 0 refills | Status: DC

## 2022-12-23 MED ORDER — EMPAGLIFLOZIN 10 MG PO TABS
10.0000 mg | ORAL_TABLET | Freq: Every day | ORAL | Status: DC
  Administered 2022-12-23 – 2022-12-24 (×2): 10 mg via ORAL
  Filled 2022-12-23 (×2): qty 1

## 2022-12-23 MED ORDER — POTASSIUM CHLORIDE CRYS ER 10 MEQ PO TBCR
40.0000 meq | EXTENDED_RELEASE_TABLET | Freq: Once | ORAL | Status: AC
  Administered 2022-12-23: 40 meq via ORAL
  Filled 2022-12-23: qty 4

## 2022-12-23 MED ORDER — LEVOTHYROXINE SODIUM 50 MCG PO TABS
50.0000 ug | ORAL_TABLET | Freq: Every day | ORAL | Status: DC
  Administered 2022-12-24: 50 ug via ORAL
  Filled 2022-12-23: qty 1

## 2022-12-23 MED ORDER — IPRATROPIUM-ALBUTEROL 0.5-2.5 (3) MG/3ML IN SOLN: 3.0000 mL | Freq: Four times a day (QID) | RESPIRATORY_TRACT | Status: DC | PRN

## 2022-12-23 MED ORDER — ROSUVASTATIN CALCIUM 5 MG PO TABS
10.0000 mg | ORAL_TABLET | Freq: Every day | ORAL | Status: DC
  Administered 2022-12-23 – 2022-12-24 (×2): 10 mg via ORAL
  Filled 2022-12-23 (×2): qty 2

## 2022-12-23 NOTE — Consult Note (Signed)
   Patient Name: [name] Date of Encounter: 12/23/2022 [facility] Cardiologist: Lance Muss, MD   Interval Summary  .    Heart rates have improved. Normal sinus rhythm for >24 hours. Still feels fatigued and short of breath.   Vital Signs .    Vitals:   12/23/22 0400 12/23/22 0530 12/23/22 0600 12/23/22 0800  BP: (!) 150/62  (!) 168/83 (!) 175/74  Pulse: 71  70 74  Resp: 20  12 (!) 22  Temp:   (!) 97.2 F (36.2 C) (!) 97.4 F (36.3 C)  TempSrc:   Oral Axillary  SpO2: 96%  98% 97%  Weight:  71 kg    Height:  4\' 11"  (1.499 m)     No intake or output data in the 24 hours ending 12/23/22 1131    12/23/2022    5:30 AM 12/21/2022   10:11 PM 12/13/2022    8:56 AM  Last 3 Weights  Weight (lbs) 156 lb 8.4 oz 157 lb 157 lb 3.2 oz  Weight (kg) 71 kg 71.215 kg 71.305 kg      Telemetry/ECG    Sinus rhythm - Personally Reviewed  Physical Exam .   GEN: No acute distress.   Neck: No JVD Cardiac: Normal rate and regular rhythm. Respiratory: Normal respiratory effort.  MS: No edema  Assessment & Plan .   [name] is a 82 y.o. female with a hx of diabetes, hypertension, hyperlipidemia, chronic diastolic heart failure who is being seen 12/22/2022 for the evaluation of severe bradycardia at the request of Dr Elayne Snare. Patient developed severe sinus bradycardia with a junctional escape rhythm in the setting of diltiazem 180 sustained-release, carvedilol 3.125 mg and 1 dose of metoprolol 100 mg for CT scan.  She received IV glucagon and her heart rates improved. With holding her AV nodal blocking agents, she has remained in sinus rhythm with appropriate rates.   Problem List:  Junctional bradycardia - resolved Mild nonobstructive coronary artery disease Diabetes type 2 with hypertension Chronic diastolic heart failure Acute kidney injury  Plan:  - Hold diltiazem and carvedilol.  - Resume home Jardiance and losartan for HTN. - Resume home rosuvastatin for  non-obstructive CAD. - Zio monitor on discharge.  - Follow up in cardiology clinic.   For questions or updates, please contact [facility] Please consult [email or web address] for contact info under     Signed, Nobie Putnam, MD

## 2022-12-23 NOTE — Evaluation (Signed)
Physical Therapy Evaluation Patient Details Name: [name] MRN: [id number] DOB: 12/21/1940 Today's Date: 12/23/2022  History of Present Illness  82 y.o. female presents to [facility] hospital on 12/21/2022 with fatigue and SOB, found to be bradycardic into the 30s. PMH includes OA, anxiety, depression, DMII, HLD, GERD.  Clinical Impression  Pt presents to PT with reported deficits in activity tolerance, although she is able to ambulate well at this time and vitals signs are stable throughout session. Pt reports social isolation as she ages and is hopeful to possibly receive increased assistance at her Independent Living. Pt does not appear to be far from her mobility baseline. PT encourages energy conservation strategies to improve activity tolerance. PT recommends discharge home when medically appropriate.      If plan is discharge home, recommend the following: Assist for transportation   Can travel by private vehicle        Equipment Recommendations None recommended by PT  Recommendations for Other Services       Functional Status Assessment Patient has not had a recent decline in their functional status     Precautions / Restrictions Precautions Precautions: None Restrictions Weight Bearing Restrictions: No      Mobility  Bed Mobility Overal bed mobility: Modified Independent                  Transfers Overall transfer level: Needs assistance Equipment used: None Transfers: Sit to/from Stand Sit to Stand: Supervision                Ambulation/Gait Ambulation/Gait assistance: Supervision Gait Distance (Feet): 150 Feet Assistive device: None Gait Pattern/deviations: Step-through pattern Gait velocity: reduced Gait velocity interpretation: <1.8 ft/sec, indicate of risk for recurrent falls   General Gait Details: slowed step-through gait, no UE support, pt stops and stands briefly multiple times during session  Stairs            Wheelchair  Mobility     Tilt Bed    Modified Rankin (Stroke Patients Only)       Balance Overall balance assessment: Needs assistance Sitting-balance support: No upper extremity supported, Feet supported Sitting balance-Leahy Scale: Good     Standing balance support: No upper extremity supported, During functional activity Standing balance-Leahy Scale: Good                               Pertinent Vitals/Pain Pain Assessment Pain Assessment: No/denies pain    Home Living Family/patient expects to be discharged to:: Other (Comment)                   Additional Comments: Independent Living at [facility]    Prior Function Prior Level of Function : Independent/Modified Independent             Mobility Comments: use of rollator or cane when outside of her apartment ADLs Comments: assistance for transportation     Extremity/Trunk Assessment   Upper Extremity Assessment Upper Extremity Assessment: Overall WFL for tasks assessed    Lower Extremity Assessment Lower Extremity Assessment: Overall WFL for tasks assessed    Cervical / Trunk Assessment Cervical / Trunk Assessment: Normal  Communication   Communication Communication: No apparent difficulties Cueing Techniques: Verbal cues  Cognition Arousal: Alert Behavior During Therapy: Anxious (pt expresses feelings of social isolation as she ages) Overall Cognitive Status: Within Functional Limits for tasks assessed  General Comments General comments (skin integrity, edema, etc.): VSS on RA, pt on 4L [place] upon PT arrival. PT weans to room air and pt sats remain 92% and above throughout session    Exercises     Assessment/Plan    PT Assessment Patient needs continued PT services  PT Problem List Cardiopulmonary status limiting activity       PT Treatment Interventions DME instruction;Gait training;Functional mobility  training;Patient/family education    PT Goals (Current goals can be found in the Care Plan section)  Acute Rehab PT Goals Patient Stated Goal: to improve activity tolerance PT Goal Formulation: With patient Time For Goal Achievement: 01/06/23 Potential to Achieve Goals: Fair Additional Goals Additional Goal #1: Pt will report 0/4 DOE when ambulating for >250' to demonstrate improved activity tolerance    Frequency Min 1X/week     Co-evaluation               AM-PAC PT "6 Clicks" Mobility  Outcome Measure Help needed turning from your back to your side while in a flat bed without using bedrails?: None Help needed moving from lying on your back to sitting on the side of a flat bed without using bedrails?: None Help needed moving to and from a bed to a chair (including a wheelchair)?: A Little Help needed standing up from a chair using your arms (e.g., wheelchair or bedside chair)?: A Little Help needed to walk in hospital room?: A Little Help needed climbing 3-5 steps with a railing? : A Little 6 Click Score: 20    End of Session   Activity Tolerance: Patient tolerated treatment well Patient left: in bed;with call bell/phone within reach Nurse Communication: Mobility status PT Visit Diagnosis: Other abnormalities of gait and mobility (R26.89)    Time: 7829-5621 PT Time Calculation (min) (ACUTE ONLY): 39 min   Charges:   PT Evaluation $PT Eval Low Complexity: 1 Low   PT General Charges $$ ACUTE PT VISIT: 1 Visit       Arlyss Gandy, PT, DPT Acute Rehabilitation Office [phone]   Arlyss Gandy 12/23/2022, 9:56 AM

## 2022-12-23 NOTE — Telephone Encounter (Addendum)
Dr. Jimmey Ralph requesting live Zio for 7 days for dx of bradycardia to be mailed to pt's house, being discharged. Will route this msg to monitor team to help arrange monitor. I outlined FYI for patient to expect this to be mailed to her in the AVS. Also sent separate staff msg to schedulers to arrange f/u as requested.  ADDENDUM: disregard this message - patient will actually remain inpatient for another 1-2 days per internal medicine, therefore our team may be able to place prior to discharge if needed. I included this as FYI in progress notes today for weekday team to review.

## 2022-12-23 NOTE — Progress Notes (Addendum)
Originally was going to arrange outpatient 7 day Live Zio to be mailed to patient's house but received update patient will remain in the hospital another 1-2 days therefore the weekday team can review chart for discharge and arrange for this to be placed prior to discharge. Will also need to arrange follow-up appointment when patient is discharged.

## 2022-12-23 NOTE — Discharge Summary (Incomplete)
Physician Discharge Summary  [name] WGN:[id number] DOB: 09-07-1940 DOA: 12/21/2022  PCP: Chilton Si, MD  Admit date: 12/21/2022 Discharge date: 12/24/2022  Admitted From: Home Disposition: Home  Recommendations for Outpatient Follow-up:  Follow up with PCP in 1 week with repeat CBC/BMP Outpatient follow-up with cardiology Follow up in ED if symptoms worsen or new appear   Home Health: No Equipment/Devices: Oxygen via nasal cannula at 1 L/min.  Discharge Condition: Stable CODE STATUS: Full Diet recommendation: Heart healthy/carb modified  Brief/Interim Summary: 82 year old female with past medical history of HLD, HTN, T2DM,  hypothyroidism, GERD,  sleep apnea not on CPAP presented with generalized weakness and trouble breathing.  On presentation, heart rate were in the 30s.  Cardiology was consulted.  She received a dose of IV atropine and calcium gluconate.  Coreg and Cardizem were held.  Subsequently, her heart rate has remained stable.  She initially required supplemental oxygen on admission and still requiring supplemental oxygen on ambulation.  Cardiology has cleared the patient for discharge and will arrange for 7-day live ZIO monitor at discharge.  She will be discharged home with outpatient follow-up with PCP and cardiology.  Discharge Diagnoses:   Bradycardia -Secondary to beta-blocker and calcium channel overdose.  Heart rate in the 30s on presentation.  Received IV atropine and IV glucagon.  Cardiology recommended conservative management.  Coreg and Cardizem on hold.  Subsequently heart rates have much improved.  Cardiology has cleared the patient for discharge and will arrange for 7-day live ZIO monitor at discharge.  Discharge patient home today with outpatient follow-up with PCP and cardiology.  Will keep beta-blocker and Cardizem on hold.  Acute respiratory failure with hypoxia --Required 4 L oxygen via nasal cannula on presentation.  Saturations dropping to  the 80s intermittently on ambulation.  Will need supplemental oxygen 1 L/min on discharge.   Mild nonobstructive CAD -Outpatient follow-up with cardiology.  Currently chest pain-free.  Chronic diastolic heart failure Hypertension Hyperlipidemia -Compensated.  Coreg and spironolactone on hold.  Continue losartan and statin  Diabetes mellitus type 2 -Carb modified diet.  Continue home regimen  Leukocytosis -Resolved  Anemia of chronic disease -From chronic illnesses.  Hemoglobin stable  Hypokalemia -Improved.  Outpatient follow-up  Acute metabolic acidosis -Improving on sodium bicarbonate tablets.  Continue sodium bicarbonate tablets on discharge.  Outpatient follow-up.  Acute kidney injury -Resolved.  Outpatient follow-up of BMP spironolactone to remain on hold.  Hyperkalemia -Resolved.  Outpatient follow-up.  Hypothyroidism -Continue levothyroxine  Depression--continue fluoxetine  Obesity -Outpatient follow-up  Discharge Instructions   Allergies as of 12/23/2022       Reactions   Shellfish-derived Products Anaphylaxis   Amoxicillin Other (See Comments)   GI Issues.   Codeine Sulfate    Hydrochlorothiazide    Iodine    Iohexol     Code: HIVES, Desc: PATIENT STATES SHE BREAKS OUT IN HIVES FROM IV DYE 02/27/08/RM, Onset Date: 78469629   Paroxetine    Penicillins    Prednisone         Medication List     STOP taking these medications    carvedilol 3.125 MG tablet Commonly known as: COREG   diltiazem 180 MG 24 hr capsule Commonly known as: Tiadylt ER   spironolactone 25 MG tablet Commonly known as: Aldactone       TAKE these medications    Accu-Chek Guide Control Liqd 1 Act by In Vitro route 2 (two) times daily as needed.   Accu-Chek Guide Me w/Device Kit 1 Act by Does  not apply route 2 (two) times daily as needed.   Accu-Chek Guide test strip Generic drug: glucose blood 1 each by Other route 2 (two) times daily.   acetaminophen 500  MG tablet Commonly known as: TYLENOL Take 500 mg by mouth as needed for mild pain.   AMBULATORY NON FORMULARY MEDICATION Folding walker with front wheels.  Use as needed. Disp 1 R26.89   AMBULATORY NON FORMULARY MEDICATION Folding walker with front wheels. Dispense 1 Dx code: R26.89 Use as needed   diclofenac Sodium 1 % Gel Commonly known as: Voltaren Apply 4 g topically 4 (four) times daily.   empagliflozin 10 MG Tabs tablet Commonly known as: Jardiance Take 1 tablet (10 mg total) by mouth daily before breakfast.   FLUoxetine 40 MG capsule Commonly known as: PROZAC Take 1 capsule (40 mg total) by mouth daily.   levothyroxine 50 MCG tablet Commonly known as: SYNTHROID Take 1 tablet (50 mcg total) by mouth daily.   losartan 100 MG tablet Commonly known as: COZAAR Take 1 tablet (100 mg total) by mouth daily.   memantine 28 MG Cp24 24 hr capsule Commonly known as: NAMENDA XR Take 1 capsule (28 mg total) by mouth daily.   rosuvastatin 10 MG tablet Commonly known as: CRESTOR Take 1 tablet (10 mg total) by mouth daily.   sodium bicarbonate 650 MG tablet Take 1 tablet (650 mg total) by mouth 3 (three) times daily.        Follow-up Information     Chilton Si, MD. Schedule an appointment as soon as possible for a visit in 1 week(s).   Specialty: Cardiology Contact information: [address] [place] [address] [phone]                Allergies  Allergen Reactions   Shellfish-Derived Products Anaphylaxis   Amoxicillin Other (See Comments)    GI Issues.   Codeine Sulfate    Hydrochlorothiazide    Iodine    Iohexol      Code: HIVES, Desc: PATIENT STATES SHE BREAKS OUT IN HIVES FROM IV DYE 02/27/08/RM, Onset Date: [id number]    Paroxetine    Penicillins    Prednisone     Consultations: Cardiology   Procedures/Studies: DG Chest Portable 1 View  Result Date: 12/22/2022 CLINICAL DATA:  Shortness of breath and dizziness EXAM:  PORTABLE CHEST 1 VIEW COMPARISON:  Radiograph 11/20/2021 FINDINGS: Stable cardiomediastinal silhouette. Low lung volumes accentuate pulmonary vascularity. Defibrillator pads overlie the left chest. No focal consolidation, pleural effusion, or pneumothorax. No displaced rib fractures. IMPRESSION: No active disease. Electronically Signed   By: Minerva Fester M.D.   On: 12/22/2022 00:18   CT CORONARY MORPH W/CTA COR W/SCORE W/CA W/CM &/OR WO/CM  Result Date: 12/20/2022 HISTORY: Dyspnea on exertion (DOE) EXAM: Cardiac/Coronary  CT TECHNIQUE: The patient was scanned on a [employer]. PROTOCOL: A non-contrast, gated CT scan was obtained with axial slices of 3 mm through the heart for calcium scoring. Calcium scoring was performed using the Agatston method. A 120 kV prospective, gated, contrast cardiac scan was obtained. Gantry rotation speed was 250 msecs and collimation was 0.6 mm. Two sublingual nitroglycerin tablets (0.8 mg) were given. The 3D data set was reconstructed in 5% intervals of the 35-75% of the R-R cycle. Diastolic phases were analyzed on a dedicated workstation using MPR, MIP, and VRT modes. The patient received 95 cc of contrast. FINDINGS: Image quality: Poor. Artifact: Moderate (slab and breathing artifacts). Coronary artery calcification score: Left main: 0 Left  anterior descending artery: 53.7 Left circumflex artery: 16.5 Right coronary artery: 21.9 Total coronary calcium score of 92.1, places the patient at the 58th percentile for age and sex matched control. Coronary arteries: Normal coronary origins.  Right dominance. Left Main Coronary Artery: Normal caliber vessel, originates from the left coronary cusp, bifurcates to form a left anterior descending artery and a left circumflex artery. There is no plaque or stenosis. Left Anterior Descending Coronary Artery: Normal caliber vessel, reaches the apex, gives off 2 diagonal branches. The ostial LAD contains minimal calcified plaque  (0-24%) and minimal luminal irregularities within proximal to mid segment. Mid to apical segments are patent overall but accuracy limited due to artifact. First Diagonal branch: Patent. Second Diagonal branch: Patent. Left Circumflex Artery: Normal caliber vessel, non-dominant, travels within the atrioventricular groove and tapers off. It gives off 1 patent obtuse marginal branch. The proximal LCX contains mild mixed plaque (25-49%) and mid to distal vessel tapers off. First Obtuse Marginal branch: Normal caliber, large size, overall patent. Right Coronary Artery: Dominant vessel, originates from the right coronary cusp, terminates as a PDA and right posterolateral branch. RCA is grossly patent with minimal luminal irregularities due to soft and calcified plaque, but overall accuracy is limited due to artifacts. PDA is not well visualized. Left Atrium: Grossly normal in size with no left atrial appendage filling defect. Left Ventricle: Grossly normal in size. There are no stigmata of prior infarction. There is no abnormal filling defect. Pulmonary arteries: No proximal filling defect. Pulmonary veins: Normal pulmonary venous drainage (three veins on the right and two veins on the left). Aorta: Normal size, 27.5 mm at the mid ascending aorta (level of the PA bifurcation) measured double oblique. Aortic atherosclerosis. No dissection. Pericardium: Normal thickness with no significant effusion or calcium present. Cardiac valves: The aortic valve is trileaflet without significant calcification. The mitral valve is normal structure without significant calcification. Extra-cardiac findings: See attached radiology report for non-cardiac structures. IMPRESSION: 1. Coronary calcium score of 92.1. This was 58th percentile for age-, sex, and race-matched controls. 2. Normal coronary origin with right dominance. 3. Aortic atherosclerosis. 4. CAD-RADS = 2 Mild non-obstructive CAD. 5. Presence of both slab and breathing artifacts  may effect the overall accuracy of the study. The study did not pass the quality control protocol for it to be sent to [facility] for plaque analysis, roadmap, or CT-FFR. RECOMMENDATIONS: Consider non-atherosclerotic causes of chest pain. Consider preventive therapy and risk factor modification. Electronically Signed   By: Tessa Lerner D.O.   On: 12/20/2022 14:40      Subjective: Patient seen and examined at bedside.  Denies any chest pain, abdominal pain or fever. Discharge Exam: Vitals:   12/23/22 0600 12/23/22 0800  BP: (!) 168/83 (!) 175/74  Pulse: 70 74  Resp: 12 (!) 22  Temp: (!) 97.2 F (36.2 C) (!) 97.4 F (36.3 C)  SpO2: 98% 97%    General: Pt is alert, awake, not in acute distress.  Elderly female lying in bed.  Currently on 1 L oxygen by nasal cannula.   Cardiovascular: rate controlled, S1/S2 + Respiratory: bilateral decreased breath sounds at bases with scattered crackles Abdominal: Soft, obese, NT, ND, bowel sounds + Extremities: Trace lower extremity edema; no cyanosis    The results of significant diagnostics from this hospitalization (including imaging, microbiology, ancillary and laboratory) are listed below for reference.     Microbiology: Recent Results (from the past 240 hour(s))  MRSA Next Gen by PCR, Nasal  Status: None   Collection Time: 12/22/22  3:23 AM   Specimen: Nasal Mucosa; Nasal Swab  Result Value Ref Range Status   MRSA by PCR Next Gen NOT DETECTED NOT DETECTED Final    Comment: (NOTE) The GeneXpert MRSA Assay (FDA approved for NASAL specimens only), is one component of a comprehensive MRSA colonization surveillance program. It is not intended to diagnose MRSA infection nor to guide or monitor treatment for MRSA infections. Test performance is not FDA approved in patients less than 60 years old. Performed at [facility], [address] [place], [place] [address]      Labs: BNP (last 3 results) Recent Labs    12/21/22 2230   BNP 362.0*   Basic Metabolic Panel: Recent Labs  Lab 12/20/22 1145 12/21/22 2230 12/22/22 0720 12/23/22 0446  NA  --  139 141 139  K  --  5.3* 4.2 3.3*  CL  --  109 111 111  CO2  --  19* 16* 20*  GLUCOSE  --  131* 134* 93  BUN  --  27* 26* 15  CREATININE 0.90 1.46* 1.19* 1.01*  CALCIUM  --  9.0 8.4* 8.2*  MG  --  2.5*  --  2.2   Liver Function Tests: Recent Labs  Lab 12/21/22 2230  AST 24  ALT 22  ALKPHOS 68  BILITOT 0.4  PROT 6.9  ALBUMIN 4.1   No results for input(s): "LIPASE", "AMYLASE" in the last 168 hours. No results for input(s): "AMMONIA" in the last 168 hours. CBC: Recent Labs  Lab 12/21/22 2230 12/22/22 0720 12/23/22 0446  WBC 13.6* 12.4* 7.8  NEUTROABS 8.3* 8.6* 4.6  HGB 12.3 11.2* 11.8*  HCT 38.0 34.1* 35.1*  MCV 91.3 89.3 90.0  PLT 232 217 179   Cardiac Enzymes: No results for input(s): "CKTOTAL", "CKMB", "CKMBINDEX", "TROPONINI" in the last 168 hours. BNP: Invalid input(s): "POCBNP" CBG: Recent Labs  Lab 12/22/22 0751 12/22/22 1114 12/22/22 1534 12/22/22 2151 12/23/22 0646  GLUCAP 115* 91 86 90 94   D-Dimer Recent Labs    12/22/22 0336  DDIMER 13.72*   Hgb A1c No results for input(s): "HGBA1C" in the last 72 hours. Lipid Profile No results for input(s): "CHOL", "HDL", "LDLCALC", "TRIG", "CHOLHDL", "LDLDIRECT" in the last 72 hours. Thyroid function studies Recent Labs    12/22/22 0720  TSH 5.329*   Anemia work up No results for input(s): "VITAMINB12", "FOLATE", "FERRITIN", "TIBC", "IRON", "RETICCTPCT" in the last 72 hours. Urinalysis    Component Value Date/Time   COLORURINE YELLOW 02/15/2022 1512   APPEARANCEUR Cloudy (A) 02/15/2022 1512   LABSPEC >=1.030 (A) 02/15/2022 1512   PHURINE 6.0 02/15/2022 1512   GLUCOSEU 250 (A) 02/15/2022 1512   HGBUR NEGATIVE 02/15/2022 1512   BILIRUBINUR SMALL (A) 02/15/2022 1512   KETONESUR NEGATIVE 02/15/2022 1512   PROTEINUR NEGATIVE 11/20/2021 0341   UROBILINOGEN 1.0 02/15/2022  1512   NITRITE NEGATIVE 02/15/2022 1512   LEUKOCYTESUR NEGATIVE 02/15/2022 1512   Sepsis Labs Recent Labs  Lab 12/21/22 2230 12/22/22 0720 12/23/22 0446  WBC 13.6* 12.4* 7.8   Microbiology Recent Results (from the past 240 hour(s))  MRSA Next Gen by PCR, Nasal     Status: None   Collection Time: 12/22/22  3:23 AM   Specimen: Nasal Mucosa; Nasal Swab  Result Value Ref Range Status   MRSA by PCR Next Gen NOT DETECTED NOT DETECTED Final    Comment: (NOTE) The GeneXpert MRSA Assay (FDA approved for NASAL specimens only),  is one component of a comprehensive MRSA colonization surveillance program. It is not intended to diagnose MRSA infection nor to guide or monitor treatment for MRSA infections. Test performance is not FDA approved in patients less than 33 years old. Performed at [facility], [address], [place], [place] [address]      Time coordinating discharge: 35 minutes  SIGNED:   Glade Lloyd, MD  [facility] 12/24/2022, 9:58 AM

## 2022-12-23 NOTE — Progress Notes (Signed)
PROGRESS NOTE    [name]  VHQ:[id number] DOB: Jun 06, 1940 DOA: 12/21/2022 PCP: Chilton Si, MD   Brief Narrative:  82 year old female with past medical history of HLD, HTN, T2DM,  hypothyroidism, GERD,  sleep apnea not on CPAP presented with generalized weakness and trouble breathing.  On presentation, heart rate were in the 30s.  Cardiology was consulted.  She received a dose of IV atropine and calcium gluconate.  Coreg and Cardizem were held.   Assessment & Plan:   Bradycardia -Secondary to beta-blocker and calcium channel overdose.  Heart rate in the 30s on presentation.  Received IV atropine and IV glucagon.  Cardiology recommended conservative management.  Coreg and Cardizem on hold.  Subsequently heart rates have much improved.  Follow further cardiology recommendations  Acute respiratory failure with hypoxia. -Required 4 L oxygen via nasal cannula on presentation.  Currently on room air but saturations dropping to the 80s intermittently.  Might need supplemental oxygen on discharge.   Mild nonobstructive CAD -Outpatient follow-up with cardiology.  Currently chest pain-free.   Chronic diastolic heart failure Hypertension Hyperlipidemia -Compensated.  Coreg and spironolactone on hold.  Continue Jardiance, losartan and statin   Diabetes mellitus type 2 -Carb modified diet.  Continue CBGs with SSI.  Leukocytosis Resolved   Anemia of chronic disease -From chronic illnesses.  Hemoglobin stable   Hypokalemia -Replace.  Repeat a.m. labs.  Acute metabolic acidosis -Improving on sodium bicarbonate tablets.  Continue sodium bicarbonate tablets on discharge.  Monitor.  Acute kidney injury -Resolved.  Spironolactone on hold.  Monitor.  Hyperkalemia -Resolved.  Outpatient follow-up.   Hypothyroidism -Continue levothyroxine   Depression--continue fluoxetine   Obesity -Outpatient follow-up   DVT prophylaxis: Lovenox Code Status: Full Family Communication:  None at bedside  Disposition Plan: Status is: Inpatient Remains inpatient appropriate because: Of severity of illness.  Possible discharge in 1 to 2 days if remains stable.  Consultants: Cardiology  Procedures: None  Antimicrobials: None   Subjective: Patient seen and examined at bedside.  Slightly better but does not feel ready to go home today.  Still short of breath with exertion.  Denies any current chest pain or vomiting reported.  Objective: Vitals:   12/23/22 0530 12/23/22 0600 12/23/22 0800 12/23/22 1004  BP:  (!) 168/83 (!) 175/74   Pulse:  70 74 82  Resp:  12 (!) 22 (!) 22  Temp:  (!) 97.2 F (36.2 C) (!) 97.4 F (36.3 C)   TempSrc:  Oral Axillary   SpO2:  98% 97% (!) 88%  Weight: 71 kg     Height: 4\' 11"  (1.499 m)      No intake or output data in the 24 hours ending 12/23/22 1150 Filed Weights   12/21/22 2211 12/23/22 0530  Weight: 71.2 kg 71 kg    Examination:  General exam: Appears calm and comfortable.  Elderly female sitting on bed.  Intermittently requiring supplemental oxygen. Respiratory system: Bilateral decreased breath sounds at bases with some scattered crackles and intermittent tachypnea Cardiovascular system: S1 & S2 heard, Rate controlled Gastrointestinal system: Abdomen is nondistended, soft and nontender. Normal bowel sounds heard. Extremities: No cyanosis, clubbing; trace lower extremity edema Central nervous system: Alert and oriented. No focal neurological deficits. Moving extremities Skin: No rashes, lesions or ulcers Psychiatry: Flat affect.  Not agitated.    Data Reviewed: I have personally reviewed following labs and imaging studies  CBC: Recent Labs  Lab 12/21/22 2230 12/22/22 0720 12/23/22 0446  WBC 13.6* 12.4* 7.8  NEUTROABS 8.3* 8.6*  4.6  HGB 12.3 11.2* 11.8*  HCT 38.0 34.1* 35.1*  MCV 91.3 89.3 90.0  PLT 232 217 179   Basic Metabolic Panel: Recent Labs  Lab 12/20/22 1145 12/21/22 2230 12/22/22 0720  12/23/22 0446  NA  --  139 141 139  K  --  5.3* 4.2 3.3*  CL  --  109 111 111  CO2  --  19* 16* 20*  GLUCOSE  --  131* 134* 93  BUN  --  27* 26* 15  CREATININE 0.90 1.46* 1.19* 1.01*  CALCIUM  --  9.0 8.4* 8.2*  MG  --  2.5*  --  2.2   GFR: Estimated Creatinine Clearance: 36.2 mL/min (A) (by C-G formula based on SCr of 1.01 mg/dL (H)). Liver Function Tests: Recent Labs  Lab 12/21/22 2230  AST 24  ALT 22  ALKPHOS 68  BILITOT 0.4  PROT 6.9  ALBUMIN 4.1   No results for input(s): "LIPASE", "AMYLASE" in the last 168 hours. No results for input(s): "AMMONIA" in the last 168 hours. Coagulation Profile: No results for input(s): "INR", "PROTIME" in the last 168 hours. Cardiac Enzymes: No results for input(s): "CKTOTAL", "CKMB", "CKMBINDEX", "TROPONINI" in the last 168 hours. BNP (last 3 results) Recent Labs    09/03/22 1124  PROBNP 180.0*   HbA1C: No results for input(s): "HGBA1C" in the last 72 hours. CBG: Recent Labs  Lab 12/22/22 1114 12/22/22 1534 12/22/22 2151 12/23/22 0646 12/23/22 1140  GLUCAP 91 86 90 94 90   Lipid Profile: No results for input(s): "CHOL", "HDL", "LDLCALC", "TRIG", "CHOLHDL", "LDLDIRECT" in the last 72 hours. Thyroid Function Tests: Recent Labs    12/22/22 0720  TSH 5.329*   Anemia Panel: No results for input(s): "VITAMINB12", "FOLATE", "FERRITIN", "TIBC", "IRON", "RETICCTPCT" in the last 72 hours. Sepsis Labs: No results for input(s): "PROCALCITON", "LATICACIDVEN" in the last 168 hours.  Recent Results (from the past 240 hour(s))  MRSA Next Gen by PCR, Nasal     Status: None   Collection Time: 12/22/22  3:23 AM   Specimen: Nasal Mucosa; Nasal Swab  Result Value Ref Range Status   MRSA by PCR Next Gen NOT DETECTED NOT DETECTED Final    Comment: (NOTE) The GeneXpert MRSA Assay (FDA approved for NASAL specimens only), is one component of a comprehensive MRSA colonization surveillance program. It is not intended to diagnose MRSA  infection nor to guide or monitor treatment for MRSA infections. Test performance is not FDA approved in patients less than 9 years old. Performed at [facility], [address], [place], [place] [address]          Radiology Studies: DG Chest Portable 1 View  Result Date: 12/22/2022 CLINICAL DATA:  Shortness of breath and dizziness EXAM: PORTABLE CHEST 1 VIEW COMPARISON:  Radiograph 11/20/2021 FINDINGS: Stable cardiomediastinal silhouette. Low lung volumes accentuate pulmonary vascularity. Defibrillator pads overlie the left chest. No focal consolidation, pleural effusion, or pneumothorax. No displaced rib fractures. IMPRESSION: No active disease. Electronically Signed   By: Minerva Fester M.D.   On: 12/22/2022 00:18        Scheduled Meds:  Chlorhexidine Gluconate Cloth  6 each Topical Daily   enoxaparin (LOVENOX) injection  30 mg Subcutaneous Daily   insulin aspart  0-15 Units Subcutaneous TID WC   insulin aspart  0-5 Units Subcutaneous QHS   sodium bicarbonate  650 mg Oral TID   Continuous Infusions:        Glade Lloyd, MD [facility] 12/23/2022, 11:50  AM

## 2022-12-23 NOTE — Progress Notes (Signed)
Pt noted to desat into 80s on room air while resting in room. [place] placed, sats quickly improved to high 90s.

## 2022-12-24 ENCOUNTER — Inpatient Hospital Stay (HOSPITAL_COMMUNITY)
Admit: 2022-12-24 | Discharge: 2022-12-24 | Disposition: A | Discharge status: Home / Self Care | Payer: Medicare HMO | Attending: Physician Assistant | Admitting: Physician Assistant

## 2022-12-24 ENCOUNTER — Other Ambulatory Visit (HOSPITAL_COMMUNITY): Payer: Self-pay

## 2022-12-24 DIAGNOSIS — R001 Bradycardia, unspecified: Secondary | ICD-10-CM

## 2022-12-24 LAB — BASIC METABOLIC PANEL
Anion gap: 9 (ref 5–15)
BUN: 14 mg/dL (ref 8–23)
CO2: 20 mmol/L — ABNORMAL LOW (ref 22–32)
Calcium: 8.6 mg/dL — ABNORMAL LOW (ref 8.9–10.3)
Chloride: 109 mmol/L (ref 98–111)
Creatinine, Ser: 0.92 mg/dL (ref 0.44–1.00)
GFR, Estimated: >60 mL/min (ref >60)
Glucose, Bld: 81 mg/dL (ref 70–99)
Potassium: 3.6 mmol/L (ref 3.5–5.1)
Sodium: 138 mmol/L (ref 135–145)

## 2022-12-24 LAB — GLUCOSE, CAPILLARY
Glucose-Capillary: 91 mg/dL (ref 70–99)
Glucose-Capillary: 92 mg/dL (ref 70–99)

## 2022-12-24 LAB — MAGNESIUM: Magnesium: 2.1 mg/dL (ref 1.7–2.4)

## 2022-12-24 MED ORDER — DICLOFENAC SODIUM 1 % EX GEL
2.0000 g | Freq: Four times a day (QID) | CUTANEOUS | Status: DC
  Administered 2022-12-24 (×2): 2 g via TOPICAL
  Filled 2022-12-24: qty 100

## 2022-12-24 NOTE — Plan of Care (Signed)

## 2022-12-24 NOTE — Progress Notes (Signed)
   Telemetry reviewed - has maintained sinus rhythm overnight- no events. Some lead artifact. Plan to arrange for 7 day live zio monitor at discharge. Dr. Duke Salvia to follow-up. Will arrange follow-up appt as well.  [facility] will sign off.   Medication Recommendations:  continue to hold AVN blockers Other recommendations (labs, testing, etc):  none Follow up as an outpatient:  Dr. Duke Salvia or APP  Chrystie Nose, MD, [facility], FACP    [facility]  Medical Director of the [facility]  Card[facility] Diplomate of the American Board of Clinical Lipidology Attending Cardiologist  Direct Dial: [phone]  Fax: [phone]  Website:  [email or web address]

## 2022-12-24 NOTE — Progress Notes (Signed)
Pt discharged home, on oxygen. Portable oxygen education provided by [employer] rep at bedside. All questions answered. All discharge instructions were given to patient and all questions answered. All belongings were returned and pt is satisfied. Vital signs stable

## 2022-12-24 NOTE — Care Management (Addendum)
    Durable Medical Equipment  (From admission, onward)           Start     Ordered   12/24/22 1011  For home use only DME oxygen  Once       Question Answer Comment  Length of Need 6 Months   Mode or (Route) Nasal cannula   Liters per Minute 2   Frequency Continuous (stationary and portable oxygen unit needed)   Oxygen conserving device Yes   Oxygen delivery system Gas      12/24/22 1010

## 2022-12-24 NOTE — Progress Notes (Addendum)
Pt ambulated 13ft in hallway with assistance using a front wheel walker.   Patient Saturations on Room Air at Rest = 98%  Patient Saturations on [employer] while Ambulating = 84%  Patient Saturations on 1 Liters of oxygen while Ambulating = 95%

## 2022-12-24 NOTE — TOC Initial Note (Signed)
Transition of Care [facility]) - Initial/Assessment Note    Patient Details  Name: [name] MRN: [id number] Date of Birth: Nov 13, 1940  [facility] CM/SW Contact:    Gala Lewandowsky, RN Phone Number: 12/24/2022, 10:40 AM  Clinical Narrative: Patient presented for generalized weakness. PTA patient was from [facility]. Plan will be to return to ILF with oxygen. RN to place ambulatory sat note. Case Manager spoke with niece and both patient and herself are agreeable to oxygen for home. Referral submitted to [employer] for oxygen delivery. Portable tanks to be delivered to the room and concentrator delivered to the home. Niece states she will provide transportation back to the facility around 3 pm. No further needs identified at this time.               Expected Discharge Plan: Home/Self Care Barriers to Discharge: No Barriers Identified   Patient Goals and CMS Choice Patient states their goals for this hospitalization and ongoing recovery are:: to return to independent living facility   Choice offered to / list presented to : NA     Expected Discharge Plan and Services   Discharge Planning Services: CM Consult Post Acute Care Choice: Durable Medical Equipment Living arrangements for the past 2 months: Independent Living Facility Expected Discharge Date: 12/24/22               DME Arranged: Oxygen DME Agency: [employer] Date DME Agency Contacted: 12/24/22 Time DME Agency Contacted: 1040 Representative spoke with at DME Agency: Zack   Prior Living Arrangements/Services Living arrangements for the past 2 months: Independent Living Facility Lives with:: Facility Resident Patient language and need for interpreter reviewed:: Yes Do you feel safe going back to the place where you live?: Yes      Need for Family Participation in Patient Care: Yes (Comment) Care giver support system in place?: Yes (comment)   Criminal Activity/Legal Involvement Pertinent to  Current Situation/Hospitalization: No - Comment as needed  Activities of Daily Living   ADL Screening (condition at time of admission) Independently performs ADLs?: Yes (appropriate for developmental age) Is the patient deaf or have difficulty hearing?: No Does the patient have difficulty seeing, even when wearing glasses/contacts?: No Does the patient have difficulty concentrating, remembering, or making decisions?: No  Permission Sought/Granted Permission sought to share information with : Family Supports, Case Production designer, theatre/television/film, Oceanographer granted to share information with : Yes, Verbal Permission Granted     Permission granted to share info w AGENCY: [employer]   Emotional Assessment Appearance:: Appears stated age     Orientation: : Oriented to Self, Oriented to Place, Oriented to  Time Alcohol / Substance Use: Not Applicable Psych Involvement: No (comment)  Admission diagnosis:  Hyperkalemia [E87.5] Bradycardia [R00.1] Junctional bradycardia [R00.1] Heart failure with preserved ejection fraction, unspecified HF chronicity (HCC) [I50.30] Patient Active Problem List   Diagnosis Date Noted   Bradycardia 12/21/2022   Exertional dyspnea 08/30/2022   Mild late onset Alzheimer's dementia without behavioral disturbance, psychotic disturbance, mood disturbance, or anxiety (HCC) 09/14/2021   Chronic heart failure with preserved ejection fraction (HCC) 05/22/2021   OAB (overactive bladder) 05/22/2021   Bilateral hearing loss due to cerumen impaction 12/23/2020   Psychophysiological insomnia 12/22/2020   Encounter for general adult medical examination with abnormal findings 11/08/2020   Recurrent major depressive disorder, in full remission (HCC) 05/26/2020   Sensorineural hearing loss (SNHL) of both ears 05/26/2020   Acromioclavicular arthrosis 12/30/2014   Pseudophakia of both eyes  10/08/2014   GAD (generalized anxiety disorder) 08/19/2014   Hyperlipidemia with  target LDL less than 100 06/25/2013   GERD 12/03/2008   OBSTRUCTIVE SLEEP APNEA 05/22/2007   Type II diabetes mellitus with manifestations (HCC) 02/19/2007   Allergic rhinitis 02/19/2007   Hypothyroidism 12/21/2006   Essential hypertension 12/21/2006   PCP:  Chilton Si, MD Pharmacy:   [id number] [place], [place] - [address] [place] [address] Phone: [phone] Fax: [phone]  Social Determinants of Health (SDOH) Social History: SDOH Screenings   Food Insecurity: No Food Insecurity (12/22/2022)  Housing: Low Risk  (12/22/2022)  Transportation Needs: No Transportation Needs (12/22/2022)  Utilities: Not At Risk (12/22/2022)  Alcohol Screen: Low Risk  (12/18/2021)  Depression (PHQ2-9): Low Risk  (11/29/2022)  Financial Resource Strain: Low Risk  (12/18/2021)  Physical Activity: Insufficiently Active (12/18/2021)  Social Connections: Moderately Integrated (12/18/2021)  Stress: No Stress Concern Present (12/18/2021)  Tobacco Use: Medium Risk (12/21/2022)   Readmission Risk Interventions     No data to display

## 2022-12-25 ENCOUNTER — Ambulatory Visit (HOSPITAL_COMMUNITY): Payer: Medicare HMO

## 2022-12-28 ENCOUNTER — Telehealth (HOSPITAL_BASED_OUTPATIENT_CLINIC_OR_DEPARTMENT_OTHER): Payer: Self-pay | Admitting: Cardiovascular Disease

## 2022-12-28 NOTE — Telephone Encounter (Signed)
Returned call to patient,   Patient states she was in the hospital with a heart rate as low as 38. She states they took her off some of her medications, carvedilol, diltiazem and a couple others.   She states she has received the heart monitor and it has fallen off. She states she was able to wear it for 2 days before she started having trouble with it. Advised patient we will look at report to see how much data we got to see if she needs to wear a new one.   She states she has been feeling very very weak. She is using her oxygen.   Patient is currently scheduled for Echo 10/15 and follow up in November. Advised patient to keep echo as scheduled and that would help determine if she needs sooner follow up.

## 2022-12-28 NOTE — Telephone Encounter (Signed)
Monitor with heart rate 62-107 bpm with normal sinus rhythm. Triggered events were either associated with normal sinus rhythm or artifact. Artifact means there was poor connection of the device so the rhythm was unable to be read.  Remain off Carvedilol, Diltaizem, Spironolactone. Can discuss further at follow up.   Alver Sorrow, NP

## 2022-12-28 NOTE — Telephone Encounter (Signed)
Returned call to patient,   Provider recommendations reviewed. Patient verbalizes understanding.

## 2022-12-28 NOTE — Telephone Encounter (Signed)
Patient stated she wants a call back directly from Dr. Duke Salvia or RN Luther Parody regarding her recent hospital visit.

## 2023-01-01 ENCOUNTER — Ambulatory Visit (HOSPITAL_BASED_OUTPATIENT_CLINIC_OR_DEPARTMENT_OTHER): Payer: Medicare HMO

## 2023-01-01 DIAGNOSIS — R0609 Other forms of dyspnea: Secondary | ICD-10-CM

## 2023-01-01 LAB — ECHOCARDIOGRAM COMPLETE
Area-P 1/2: 4.06 cm2
MV M vel: 5.57 m/s
MV Peak grad: 124.1 mm[Hg]
Radius: 0.9 cm
S' Lateral: 2.14 cm

## 2023-01-16 ENCOUNTER — Other Ambulatory Visit: Payer: Self-pay

## 2023-01-16 DIAGNOSIS — F02A0 Dementia in other diseases classified elsewhere, mild, without behavioral disturbance, psychotic disturbance, mood disturbance, and anxiety: Secondary | ICD-10-CM

## 2023-01-16 MED ORDER — MEMANTINE HCL ER 28 MG PO CP24: 28.0000 mg | ORAL_CAPSULE | Freq: Every day | ORAL | 1 refills | Status: DC

## 2023-01-21 NOTE — Progress Notes (Unsigned)
Cardiology Office Note:    Date:  01/22/2023  ID:  Mischelle Reeg, DOB 1941-02-08, MRN [id number] PCP: [name], MD  [facility] Providers Cardiologist:  Chilton Si, MD       Patient Profile:      Hyperlipidemia Hypertension Type 2 diabetes mellitus Hypothyroidism Sleep apnea not on CPAP      History of Present Illness:   [name] is a 82 y.o. female who returns for follow-up hospital admission for junctional bradycardia.  Initially seen by Dr. Antoine Poche 07/2011 for dyspnea at rest.  Lexiscan Myoview 513 was negative.  She had PFTs performed showing no clear etiology for dyspnea.  She underwent right heart catheterization 7013 showing normal right heart pressures and saturation. She had an echo 09/05/2020 with LVEF 60-65%, mild LVH, grade 1 diastolic dysfunction, trivial mitral digitation, mild to moderate aortic valve sclerosis without stenosis.    Seen by Dr. Duke Salvia 12/13/2022 reporting intermittent dyspnea on exertion for several months.  Blood pressure elevated at this visit, spironolactone 25 mg was started.  Coronary CTA ordered and showed coronary calcium score 19.1, 58 percentile for age sex 38s.  Normal coronary origins with right dominance, mild nonobstructive CAD.  Echocardiogram was ordered showing LVEF 55-60%, LV normal function, no RWMA, grade 1 diastolic dysfunction, RV SF normal, aortic valve sclerosis/calcification.   Patient admitted 10/4 - 12/24/2022 for junctional bradycardia.  A day prior to admission patient had a cardiac CT, she took metoprolol to tartrate 100 mg, held her usual Coreg and took all of her other medications including diltiazem 180 mg.  Next day she woke up with weakness, took her normal medications including Coreg 3.25 mg and diltiazem.  She woke up later that afternoon and noted severe weakness with difficulty breathing.  She went to the ED for heart rate was in the 30s.  This was thought secondary to beta-blocker and calcium  channel overdose.  Atropine and IV glucagon were administered.  On discharge her Coreg and diltiazem were held.  Her losartan and spironolactone were resumed.  She was discharged with live ZIO monitor.  Unfortunately the monitor fell off 4 days after wearing device, her heart rate was 64-136 bpm bpm with normal sinus rhythm, triggered events were associated with either normal sinus rhythm or artifact.  Today: Patient is doing well from cardiac standpoint.  She denies any tachycardia or palpitations since discontinuation of her AV nodal blocking agents.  She notes continued DOE however greatly improved.  No SOB at rest.  She was DC'd on 1 L nasal cannula at all times.  She would like to continue O2 support at home, not interested in weaning at this time.  She would like smaller portable oxygen tank.  She denies any leg swelling, chest pain.  Weight has been stable.          Review of Systems  Constitutional: Negative for weight gain and weight loss.  Cardiovascular:  Positive for dyspnea on exertion. Negative for chest pain, claudication, cyanosis, irregular heartbeat, leg swelling, near-syncope, orthopnea, palpitations, paroxysmal nocturnal dyspnea and syncope.  Respiratory:  Negative for cough, hemoptysis and shortness of breath.   Gastrointestinal:  Negative for abdominal pain, hematochezia and melena.  Genitourinary:  Negative for hematuria.     See HPI     Studies Reviewed:       Cardiac Studies & Procedures       ECHOCARDIOGRAM  ECHOCARDIOGRAM COMPLETE 01/01/2023  Narrative ECHOCARDIOGRAM REPORT    Patient Name:   [name]  Date of Exam: 01/01/2023 Medical Rec #:  [id number]      Height:       59.0 in Accession #:    [id number]     Weight:       157.0 lb Date of Birth:  [birth year]      BSA:          1.664 m Patient Age:    82 years       BP:           126/84 mmHg Patient Gender: F              HR:           67 bpm. Exam Location:  Outpatient  Procedure: 2D Echo, 3D  Echo, Color Doppler, Cardiac Doppler and Strain Analysis  Indications:    Dyspnea  History:        Patient has prior history of Echocardiogram examinations, most recent 08/26/2020. Risk Factors:Hypertension, Dyslipidemia, Former Smoker and Diabetes. Mild late onset Alzheimers dementia.  Sonographer:    Jeryl Columbia RDCS Referring Phys: [id number] TIFFANY [place]  IMPRESSIONS   1. Left ventricular ejection fraction, by estimation, is 55 to 60%. The left ventricle has normal function. The left ventricle has no regional wall motion abnormalities. Left ventricular diastolic parameters are consistent with Grade I diastolic dysfunction (impaired relaxation). 2. Right ventricular systolic function is normal. The right ventricular size is normal. Tricuspid regurgitation signal is inadequate for assessing PA pressure. 3. The mitral valve is grossly normal. Trivial mitral valve regurgitation. 4. The aortic valve is tricuspid. Aortic valve regurgitation is not visualized. Aortic valve sclerosis/calcification is present, without any evidence of aortic stenosis. 5. The inferior vena cava is normal in size with greater than 50% respiratory variability, suggesting right atrial pressure of 3 mmHg.  Comparison(s): Changes from prior study are noted. 08/26/2020: LVEF 60-65%, grade 1 DD, aortic valve sclerosis.  FINDINGS Left Ventricle: Left ventricular ejection fraction, by estimation, is 55 to 60%. The left ventricle has normal function. The left ventricle has no regional wall motion abnormalities. The left ventricular internal cavity size was normal in size. There is no left ventricular hypertrophy. Left ventricular diastolic parameters are consistent with Grade I diastolic dysfunction (impaired relaxation).  Right Ventricle: The right ventricular size is normal. No increase in right ventricular wall thickness. Right ventricular systolic function is normal. Tricuspid regurgitation signal is inadequate for  assessing PA pressure.  Left Atrium: Left atrial size was normal in size.  Right Atrium: Right atrial size was normal in size.  Pericardium: There is no evidence of pericardial effusion.  Mitral Valve: The mitral valve is grossly normal. Trivial mitral valve regurgitation.  Tricuspid Valve: The tricuspid valve is normal in structure. Tricuspid valve regurgitation is not demonstrated.  Aortic Valve: The aortic valve is tricuspid. Aortic valve regurgitation is not visualized. Aortic valve sclerosis/calcification is present, without any evidence of aortic stenosis.  Pulmonic Valve: The pulmonic valve was normal in structure. Pulmonic valve regurgitation is not visualized.  Aorta: The aortic root and ascending aorta are structurally normal, with no evidence of dilitation.  Venous: The inferior vena cava is normal in size with greater than 50% respiratory variability, suggesting right atrial pressure of 3 mmHg.  IAS/Shunts: No atrial level shunt detected by color flow Doppler.   LEFT VENTRICLE PLAX 2D LVIDd:         3.30 cm LVIDs:         2.14 cm LV PW:  0.82 cm LV IVS:        0.88 cm LVOT diam:     1.80 cm   3D Volume EF: LVOT Area:     2.54 cm  3D EF:        54 % LV EDV:       75 ml LV ESV:       35 ml LV SV:        41 ml  RIGHT VENTRICLE RV Basal diam:  3.60 cm RV Mid diam:    2.83 cm RV S prime:     9.90 cm/s TAPSE (M-mode): 2.6 cm  LEFT ATRIUM           Index        RIGHT ATRIUM           Index LA diam:      3.20 cm 1.92 cm/m   RA Area:     12.50 cm LA Vol (A2C): 35.0 ml 21.03 ml/m  RA Volume:   29.60 ml  17.79 ml/m  AORTA Ao Root diam: 2.70 cm  MITRAL VALVE MV Area (PHT): 4.06 cm       SHUNTS MV Decel Time: 187 msec       Systemic Diam: 1.80 cm MR Peak grad:    124.1 mmHg MR Mean grad:    94.0 mmHg MR Vmax:         557.00 cm/s MR Vmean:        463.0 cm/s MR PISA:         5.09 cm MR PISA Eff ROA: 35 mm MR PISA Radius:  0.90 cm MV E velocity:  89.60 cm/s MV A velocity: 95.90 cm/s MV E/A ratio:  0.93  Zoila Shutter MD Electronically signed by Zoila Shutter MD Signature Date/Time: 01/01/2023/3:07:38 PM    Final     CT SCANS  CT CORONARY MORPH W/CTA COR W/SCORE 12/20/2022  Addendum 01/12/2023  5:24 PM ADDENDUM REPORT: 01/12/2023 17:21  EXAM: OVER-READ INTERPRETATION  CT CHEST  The following report is an over-read performed by radiologist Dr. Lesia Hausen [facility] on 01/12/2023. This over-read does not include interpretation of cardiac or coronary anatomy or pathology. The coronary CTA interpretation by the cardiologist is attached.  COMPARISON:  11/20/2021 chest radiograph.  09/06/2011 chest CT.  FINDINGS: Cardiovascular: Top-normal heart size. No significant pericardial effusion/thickening. Atherosclerotic nonaneurysmal thoracic aorta. Normal caliber pulmonary arteries. No central pulmonary emboli.  Mediastinum/Nodes: Mildly patulous thoracic esophagus with mild layering fluid and debris. No pathologically enlarged mediastinal or hilar lymph nodes.  Lungs/Pleura: No pneumothorax. No pleural effusion. No acute consolidative airspace disease, lung masses or significant pulmonary nodules.  Upper abdomen: Mildly hypervascular 1.1 cm focus in the small spleen (series 14/image 43), potentially stable as seen on series 2/image 14 on 06/17/2013 CT abdomen study, although difficult to compare given different phases of contrast. Chronic hyperdense foci along the margin of the esophagogastric junction presumably from prior hiatal hernia repair.  Musculoskeletal: No aggressive appearing focal osseous lesions. Marked thoracic spondylosis.  IMPRESSION: 1. Mildly patulous thoracic esophagus with mild layering fluid and debris, suggesting chronic esophageal dysmotility. 2. Mildly hypervascular 1.1 cm focus in the small spleen, potentially stable as seen on 06/17/2013 CT abdomen study,  although difficult to compare given different phases of contrast. This is most likely a benign lesion such as a hemangioma. No follow-up imaging recommended. 3.  Aortic Atherosclerosis (ICD10-I70.0).   Electronically Signed By: Delbert Phenix M.D. On: 01/12/2023 17:21  Narrative HISTORY: Dyspnea  on exertion (DOE)  EXAM: Cardiac/Coronary  CT  TECHNIQUE: The patient was scanned on a [employer].  PROTOCOL: A non-contrast, gated CT scan was obtained with axial slices of 3 mm through the heart for calcium scoring. Calcium scoring was performed using the Agatston method. A 120 kV prospective, gated, contrast cardiac scan was obtained. Gantry rotation speed was 250 msecs and collimation was 0.6 mm. Two sublingual nitroglycerin tablets (0.8 mg) were given. The 3D data set was reconstructed in 5% intervals of the 35-75% of the R-R cycle. Diastolic phases were analyzed on a dedicated workstation using MPR, MIP, and VRT modes. The patient received 95 cc of contrast.  FINDINGS: Image quality: Poor.  Artifact: Moderate (slab and breathing artifacts).  Coronary artery calcification score:  Left main: 0  Left anterior descending artery: 53.7  Left circumflex artery: 16.5  Right coronary artery: 21.9  Total coronary calcium score of 92.1, places the patient at the 58th percentile for age and sex matched control.  Coronary arteries: Normal coronary origins.  Right dominance.  Left Main Coronary Artery: Normal caliber vessel, originates from the left coronary cusp, bifurcates to form a left anterior descending artery and a left circumflex artery. There is no plaque or stenosis.  Left Anterior Descending Coronary Artery: Normal caliber vessel, reaches the apex, gives off 2 diagonal branches. The ostial LAD contains minimal calcified plaque (0-24%) and minimal luminal irregularities within proximal to mid segment. Mid to apical segments are patent overall but  accuracy limited due to artifact.  First Diagonal branch: Patent.  Second Diagonal branch: Patent.  Left Circumflex Artery: Normal caliber vessel, non-dominant, travels within the atrioventricular groove and tapers off. It gives off 1 patent obtuse marginal branch. The proximal LCX contains mild mixed plaque (25-49%) and mid to distal vessel tapers off.  First Obtuse Marginal branch: Normal caliber, large size, overall patent.  Right Coronary Artery: Dominant vessel, originates from the right coronary cusp, terminates as a PDA and right posterolateral branch. RCA is grossly patent with minimal luminal irregularities due to soft and calcified plaque, but overall accuracy is limited due to artifacts. PDA is not well visualized.  Left Atrium: Grossly normal in size with no left atrial appendage filling defect.  Left Ventricle: Grossly normal in size. There are no stigmata of prior infarction. There is no abnormal filling defect.  Pulmonary arteries: No proximal filling defect.  Pulmonary veins: Normal pulmonary venous drainage (three veins on the right and two veins on the left).  Aorta: Normal size, 27.5 mm at the mid ascending aorta (level of the PA bifurcation) measured double oblique. Aortic atherosclerosis. No dissection.  Pericardium: Normal thickness with no significant effusion or calcium present.  Cardiac valves: The aortic valve is trileaflet without significant calcification. The mitral valve is normal structure without significant calcification.  Extra-cardiac findings: See attached radiology report for non-cardiac structures.  IMPRESSION: 1. Coronary calcium score of 92.1. This was 58th percentile for age-, sex, and race-matched controls.  2. Normal coronary origin with right dominance.  3. Aortic atherosclerosis.  4. CAD-RADS = 2 Mild non-obstructive CAD.  5. Presence of both slab and breathing artifacts may effect the overall accuracy of the study.  The study did not pass the quality control protocol for it to be sent to Heart Flow for plaque analysis, roadmap, or CT-FFR.  RECOMMENDATIONS:  Consider non-atherosclerotic causes of chest pain. Consider preventive therapy and risk factor modification.  Electronically Signed: By: Tessa Lerner D.O. On: 12/20/2022 14:40  Risk Assessment/Calculations:             Physical Exam:   VS:  BP 118/76 (BP Location: Left Arm, Patient Position: Sitting, Cuff Size: Normal)   Pulse 79   Ht 4\' 11"  (1.499 m)   Wt 154 lb 4.8 oz (70 kg)   SpO2 98%   BMI 31.16 kg/m    Wt Readings from Last 3 Encounters:  01/22/23 154 lb 4.8 oz (70 kg)  12/24/22 156 lb 15.5 oz (71.2 kg)  12/13/22 157 lb 3.2 oz (71.3 kg)    Constitutional:      Appearance: Normal and healthy appearance.  Neck:     Vascular: JVD normal.  Pulmonary:     Effort: Pulmonary effort is normal.     Breath sounds: Normal breath sounds.  Chest:     Chest wall: Not tender to palpatation.  Cardiovascular:     PMI at left midclavicular line. Normal rate. Regular rhythm. Normal S1. Normal S2.      Murmurs: There is no murmur.     No gallop.  No click. No rub.  Pulses:    Intact distal pulses.  Edema:    Peripheral edema absent.  Musculoskeletal: Normal range of motion.     Cervical back: Normal range of motion and neck supple. Skin:    General: Skin is warm and dry.  Neurological:     General: No focal deficit present.     Mental Status: Alert and oriented to person, place and time.  Psychiatric:        Behavior: Behavior is cooperative.        Assessment and Plan:  Junctional bradycardia -Resolved with discontinuation of AV nodal blocking agents -Heart monitor 01/10/2023, min heart rate of 64, max rate 136.  No arrhythmias noted.  Mild nonobstructive coronary artery disease -Coronary CTA 12/20/2022, mild CAD, calcium score 92 -Stable with no anginal symptoms, no indication for ischemic evaluation  -Continue  rosuvastatin 10 mg, LDL 145 on 11/29/2022, LDL 59 in 2023 -Lipid panel ordered from last visit, she will come back fasting for labs -If LDL not at goal will increase rosuvastatin to 20 mg  Chronic diastolic heart failure -Echo 01/01/2023 LVEF 55-60% -Euvolemic and well compensated -Coreg discontinued due to bradycardia, spironolactone discontinued due to hyperkalemia -Continue Jardiance 10 mg -Encourage daily weights, less than 2 g sodium restriction, less than 2 L of water restriction  Hypertension -BP well-controlled -Continue losartan 100 mg  Acute respiratory failure with hypoxia -Required 4 L O2 on admission, discharged on 1 L -She does have ongoing DOE, which is improved on present oxygen -Will have her follow-up with pulmonology                 Dispo:  Return in about 3 months (around 04/24/2023).  Signed, Denyce Robert, NP

## 2023-01-22 ENCOUNTER — Other Ambulatory Visit (HOSPITAL_BASED_OUTPATIENT_CLINIC_OR_DEPARTMENT_OTHER): Payer: Self-pay

## 2023-01-22 ENCOUNTER — Encounter (HOSPITAL_BASED_OUTPATIENT_CLINIC_OR_DEPARTMENT_OTHER): Payer: Self-pay | Admitting: Emergency Medicine

## 2023-01-22 ENCOUNTER — Ambulatory Visit (HOSPITAL_BASED_OUTPATIENT_CLINIC_OR_DEPARTMENT_OTHER): Payer: Medicare HMO | Admitting: Emergency Medicine

## 2023-01-22 VITALS — BP 118/76 | HR 79 | Ht 59.0 in | Wt 154.3 lb

## 2023-01-22 DIAGNOSIS — I2510 Atherosclerotic heart disease of native coronary artery without angina pectoris: Secondary | ICD-10-CM

## 2023-01-22 DIAGNOSIS — I10 Essential (primary) hypertension: Secondary | ICD-10-CM

## 2023-01-22 DIAGNOSIS — E785 Hyperlipidemia, unspecified: Secondary | ICD-10-CM | POA: Diagnosis not present

## 2023-01-22 DIAGNOSIS — I5032 Chronic diastolic (congestive) heart failure: Secondary | ICD-10-CM | POA: Diagnosis not present

## 2023-01-22 DIAGNOSIS — R001 Bradycardia, unspecified: Secondary | ICD-10-CM

## 2023-01-22 MED ORDER — COMIRNATY 30 MCG/0.3ML IM SUSY
0.3000 mL | PREFILLED_SYRINGE | Freq: Once | INTRAMUSCULAR | 0 refills | Status: AC
  Filled 2023-01-22: qty 0.3, 1d supply, fill #0

## 2023-01-22 NOTE — Patient Instructions (Addendum)
Medication Instructions:  Continue your current medications.   *If you need a refill on your cardiac medications before your next appointment, please call your pharmacy*  Follow-Up: At [facility], you and your health needs are our priority.  As part of our continuing mission to provide you with exceptional heart care, we have created designated Provider Care Teams.  These Care Teams include your primary Cardiologist (physician) and Advanced Practice Providers (APPs -  Physician Assistants and Nurse Practitioners) who all work together to provide you with the care you need, when you need it.  We recommend signing up for the patient portal called "MyChart".  Sign up information is provided on this After Visit Summary.  MyChart is used to connect with patients for Virtual Visits (Telemedicine).  Patients are able to view lab/test results, encounter notes, upcoming appointments, etc.  Non-urgent messages can be sent to your provider as well.   To learn more about what you can do with MyChart, go to [email or web address].    Your next appointment:   3-4 month(s)  Provider:   Chilton Si, MD    Other Instructions  Recommend discussing oxygen with Dr. Wynona Neat (pulmonology) [phone] or Dr. Yetta Barre (primary care) [phone] as they can help get a smaller concentrator if appropriate.   Tips to Measure your Blood Pressure Correctly  Please contact the office if your blood pressure at home is consistently more than 130/80.   Here's what you can do to ensure a correct reading:  Don't drink a caffeinated beverage or smoke during the 30 minutes before the test.  Sit quietly for five minutes before the test begins.  During the measurement, sit in a chair with your feet on the floor and your arm supported so your elbow is at about heart level.  The inflatable part of the cuff should completely cover at least 80% of your upper arm, and the cuff should be placed on bare skin, not over  a shirt.  Don't talk during the measurement.  Blood pressure categories  Blood pressure category SYSTOLIC (upper number)  DIASTOLIC (lower number)  Normal Less than 120 mm Hg and Less than 80 mm Hg  Elevated 120-129 mm Hg and Less than 80 mm Hg  High blood pressure: Stage 1 hypertension 130-139 mm Hg or 80-89 mm Hg  High blood pressure: Stage 2 hypertension 140 mm Hg or higher or 90 mm Hg or higher  Hypertensive crisis (consult your doctor immediately) Higher than 180 mm Hg and/or Higher than 120 mm Hg  Source: American Heart Association and American Stroke Association. For more on getting your blood pressure under control, buy Controlling Your Blood Pressure, a Special Health Report from [facility].   Blood Pressure Log   Date   Time  Blood Pressure  Example: Nov 1 9 AM 124/78

## 2023-01-24 ENCOUNTER — Encounter: Payer: Self-pay | Admitting: Pulmonary Disease

## 2023-01-24 ENCOUNTER — Ambulatory Visit: Payer: Medicare HMO | Admitting: Pulmonary Disease

## 2023-01-24 VITALS — BP 124/70 | HR 91 | Ht 59.0 in | Wt 154.8 lb

## 2023-01-24 DIAGNOSIS — J9611 Chronic respiratory failure with hypoxia: Secondary | ICD-10-CM | POA: Diagnosis not present

## 2023-01-24 NOTE — Progress Notes (Signed)
[name]    [id number]    [birth year]  Primary Care Physician:Jones, Bernadene Bell, MD  Referring Physician: [name], MD [address] [place],  [place] [address]  Chief complaint:   Patient being seen for obstructive sleep apnea  HPI:  Diagnosed with sleep apnea in 2014 She stated she was told before she left [place] that she does not need to use CPAP anymore  Still has the machine but has not been using it  Was recently hospitalized for shortness of breath, treated for junctional bradycardia She was discharged on oxygen supplementation  Has been using oxygen with activity and at rest Notices that her oxygen still drops We will below 90 if she does not have it on  Relocated from [place]  She did have an auto titrating machine for sleep previously  Supplies are from [employer]  Usually goes to bed about 12 AM to 12:30 AM AM, falls asleep quickly About 3 awakenings Usual wake up time between 11 and 1 PM  History of hypertension, diabetes, hypercholesterolemia, she does have musculoskeletal pain and discomfort uses a walker for ambulation  Outpatient Encounter Medications as of 01/24/2023  Medication Sig   acetaminophen (TYLENOL) 500 MG tablet Take 500 mg by mouth as needed for mild pain.   AMBULATORY NON FORMULARY MEDICATION Folding walker with front wheels.  Use as needed. Disp 1 R26.89   AMBULATORY NON FORMULARY MEDICATION Folding walker with front wheels. Dispense 1 Dx code: R26.89 Use as needed   Blood Glucose Calibration (ACCU-CHEK GUIDE CONTROL) LIQD 1 Act by In Vitro route 2 (two) times daily as needed.   Blood Glucose Monitoring Suppl (ACCU-CHEK GUIDE ME) w/Device KIT 1 Act by Does not apply route 2 (two) times daily as needed.   diclofenac Sodium (VOLTAREN) 1 % GEL Apply 4 g topically 4 (four) times daily.   empagliflozin (JARDIANCE) 10 MG TABS tablet Take 1 tablet (10 mg total) by mouth daily before breakfast.   FLUoxetine (PROZAC) 40 MG  capsule Take 1 capsule (40 mg total) by mouth daily.   glucose blood (ACCU-CHEK GUIDE) test strip 1 each by Other route 2 (two) times daily.   levothyroxine (SYNTHROID) 50 MCG tablet Take 1 tablet (50 mcg total) by mouth daily.   losartan (COZAAR) 100 MG tablet Take 1 tablet (100 mg total) by mouth daily.   memantine (NAMENDA XR) 28 MG CP24 24 hr capsule Take 1 capsule (28 mg total) by mouth daily.   rosuvastatin (CRESTOR) 10 MG tablet Take 1 tablet (10 mg total) by mouth daily.   sodium bicarbonate 650 MG tablet Take 1 tablet (650 mg total) by mouth 3 (three) times daily.   No facility-administered encounter medications on file as of 01/24/2023.    Allergies as of 01/24/2023 - Review Complete 01/24/2023  Allergen Reaction Noted   Shellfish-derived products Anaphylaxis 12/22/2022   Amoxicillin Other (See Comments) 06/28/2011   Codeine sulfate     Hydrochlorothiazide     Iodine     Iohexol  02/27/2008   Paroxetine     Penicillins     Prednisone      Past Medical History:  Diagnosis Date   Allergic rhinitis    Anxiety    Arthritis    Chronic cough    Dr Sherene Sires, onset 11/2007, Sinus CT July 7,2010>neg, Resolved 10/2008 on ppi two times a day   Colon polyp    Depression    Diabetes mellitus type II  diet   Diverticulosis    Dyslipidemia    GERD (gastroesophageal reflux disease)    Hiatal hernia    Hypertension    Hypothyroidism    Lung nodule    Obesity (BMI 30-39.9)    Pneumonia    Sleep apnea    Dr Shelle Iron CPAP 2009    Past Surgical History:  Procedure Laterality Date   ABDOMINAL HYSTERECTOMY     APPENDECTOMY     BLADDER SUSPENSION     CHOLECYSTECTOMY     1998 in [facility]   EYE SURGERY  10/13   and 12/13; cataract surgery   Left breast lumpectomy     NISSEN FUNDOPLICATION     March '11 Dr Michaell Cowing   RECTOCELE REPAIR     TONSILLECTOMY     x 2   VENTRAL HERNIA REPAIR      Family History  Problem Relation Age of Onset   Stroke Mother 61   Lung cancer Father     Breast cancer Neg Hx     Social History   Socioeconomic History   Marital status: Single    Spouse name: Not on file   Number of children: Not on file   Years of education: Not on file   Highest education level: Not on file  Occupational History   Occupation: retired Engineer, petroleum  Tobacco Use   Smoking status: Former    Current packs/day: 0.00    Average packs/day: 1.5 packs/day for 25.0 years (37.5 ttl pk-yrs)    Types: Cigarettes    Start date: 10/28/1956    Quit date: 10/28/1981    Years since quitting: 41.2   Smokeless tobacco: Never  Substance and Sexual Activity   Alcohol use: No   Drug use: No   Sexual activity: Not Currently  Other Topics Concern   Not on file  Social History Narrative   HSG. Married -divorced. 2 sons. 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 Patient does not get regular exercise. Loves to read. Strong faith. Lives alone.   Social Determinants of Health   Financial Resource Strain: Low Risk  (12/18/2021)   Overall Financial Resource Strain (CARDIA)    Difficulty of Paying Living Expenses: Not hard at all  Food Insecurity: No Food Insecurity (12/22/2022)   Hunger Vital Sign    Worried About Running Out of Food in the Last Year: Never true    Ran Out of Food in the Last Year: Never true  Transportation Needs: No Transportation Needs (12/22/2022)   PRAPARE - Administrator, Civil Service (Medical): No    Lack of Transportation (Non-Medical): No  Physical Activity: Insufficiently Active (12/18/2021)   Exercise Vital Sign    Days of Exercise per Week: 3 days    Minutes of Exercise per Session: 30 min  Stress: No Stress Concern Present (12/18/2021)   [employer] of Occupational Health - Occupational Stress Questionnaire    Feeling of Stress : Not at all  Social Connections: Moderately Integrated (12/18/2021)   Social Connection and Isolation Panel [NHANES]    Frequency of Communication with Friends and Family: More than  three times a week    Frequency of Social Gatherings with Friends and Family: More than three times a week    Attends Religious Services: More than 4 times per year    Active Member of [employer] or Organizations: Yes    Attends Engineer, structural: More than 4 times per year    Marital Status: Divorced  Intimate  Partner Violence: Not At Risk (12/22/2022)   Humiliation, Afraid, Rape, and Kick questionnaire    Fear of Current or Ex-Partner: No    Emotionally Abused: No    Physically Abused: No    Sexually Abused: No    Review of Systems  Constitutional:  Negative for fatigue.  Respiratory:  Positive for apnea.   Psychiatric/Behavioral:  Positive for sleep disturbance.     Vitals:   01/24/23 1025  BP: 124/70  Pulse: 91  SpO2: 98%     Physical Exam Constitutional:      Appearance: She is obese.  HENT:     Head: Normocephalic.     Mouth/Throat:     Mouth: Mucous membranes are moist.  Eyes:     General: No scleral icterus. Cardiovascular:     Rate and Rhythm: Normal rate and regular rhythm.     Heart sounds: No murmur heard.    No friction rub.  Pulmonary:     Effort: No respiratory distress.     Breath sounds: No stridor. No wheezing or rhonchi.  Musculoskeletal:     Cervical back: No rigidity or tenderness.  Neurological:     Mental Status: She is alert.  Psychiatric:        Mood and Affect: Mood normal.    Data Reviewed: No machine download available as she has not been using the machine  Assessment:  Mild obstructive sleep apnea -Has not been on CPAP therapy  Hypoxemic respiratory failure -This was recently diagnosed in the hospital  Chronic diastolic heart failure  Patient was ambulated in the office today Desaturated to low 80s Required 4 L of oxygen supplementation to maintain saturations above 90  Hypertension Diabetes Hypercholesterolemia   Plan/Recommendations: Follow-up in 3 months  Portable oxygen concentrator prescription, to use  at 4 L with ambulation  Encourage graded activities as tolerated  Follow-up in about 6 weeks  Encouraged to call with significant concerns  I spent 30 minutes dedicated to the care of this patient on the date of this encounter to include previsit review of records, face-to-face time with the patient discussing conditions above, post visit ordering of testing, clinical documentation with electronic health record.  Virl Diamond MD [facility] 01/24/2023, 10:38 AM  CC: [name], MD

## 2023-01-24 NOTE — Patient Instructions (Signed)
Prescription for portable concentrator, 4 L with activity  Continue using oxygen  Call us with significant concerns  Follow-up in about 6 weeks

## 2023-01-28 ENCOUNTER — Telehealth: Payer: Self-pay | Admitting: Family Medicine

## 2023-01-28 NOTE — Telephone Encounter (Signed)
Maybe she is wanting a standard steroid injection. Insurance has denied coverage for Zilretta (does not cover repeat injections). She  is not due for gel shots until Jan 2025 I believe.     Coverage for Zilretta injection(s) DENIED   "Neither Medicare nor your Medicare Advantage plan covers this drug for your condition at the frequency your doctor has prescribed. Medical studies have not proven that this drug frequency (repeat injection) is safe and helpful for treatment of your health problem"

## 2023-01-28 NOTE — Telephone Encounter (Signed)
Pt scheduled for appt 11/19, bil knees.   Looks like she is not due for gel, may be due to Zilretta? Can you confirm so we can be sure to have stock for this visit.

## 2023-01-29 NOTE — Telephone Encounter (Signed)
Called pt and advised that Zilretta coverage has been denied by insurance. Not due for gel shot until 04/05/23, Dr. Denyse Amass may consider standard steroid injection at upcoming visit 02/05/23.   Pt verbalized understanding.

## 2023-02-04 ENCOUNTER — Ambulatory Visit (INDEPENDENT_AMBULATORY_CARE_PROVIDER_SITE_OTHER): Payer: Medicare HMO

## 2023-02-04 VITALS — BP 130/94 | HR 84 | Ht 59.0 in | Wt 149.0 lb

## 2023-02-04 DIAGNOSIS — Z0000 Encounter for general adult medical examination without abnormal findings: Secondary | ICD-10-CM | POA: Diagnosis not present

## 2023-02-04 NOTE — Patient Instructions (Signed)
Health Maintenance, Female Adopting a healthy lifestyle and getting preventive care are important in promoting health and wellness. Ask your health care provider about: The right schedule for you to have regular tests and exams. Things you can do on your own to prevent diseases and keep yourself healthy. What should I know about diet, weight, and exercise? Eat a healthy diet  Eat a diet that includes plenty of vegetables, fruits, low-fat dairy products, and lean protein. Do not eat a lot of foods that are high in solid fats, added sugars, or sodium. Maintain a healthy weight Body mass index (BMI) is used to identify weight problems. It estimates body fat based on height and weight. Your health care provider can help determine your BMI and help you achieve or maintain a healthy weight. Get regular exercise Get regular exercise. This is one of the most important things you can do for your health. Most adults should: Exercise for at least 150 minutes each week. The exercise should increase your heart rate and make you sweat (moderate-intensity exercise). Do strengthening exercises at least twice a week. This is in addition to the moderate-intensity exercise. Spend less time sitting. Even light physical activity can be beneficial. Watch cholesterol and blood lipids Have your blood tested for lipids and cholesterol at 83 years of age, then have this test every 5 years. Have your cholesterol levels checked more often if: Your lipid or cholesterol levels are high. You are older than 83 years of age. You are at high risk for heart disease. What should I know about cancer screening? Depending on your health history and family history, you may need to have cancer screening at various ages. This may include screening for: Breast cancer. Cervical cancer. Colorectal cancer. Skin cancer. Lung cancer. What should I know about heart disease, diabetes, and high blood pressure? Blood pressure and heart  disease High blood pressure causes heart disease and increases the risk of stroke. This is more likely to develop in people who have high blood pressure readings or are overweight. Have your blood pressure checked: Every 3-5 years if you are 19-38 years of age. Every year if you are 29 years old or older. Diabetes Have regular diabetes screenings. This checks your fasting blood sugar level. Have the screening done: Once every three years after age 8 if you are at a normal weight and have a low risk for diabetes. More often and at a younger age if you are overweight or have a high risk for diabetes. What should I know about preventing infection? Hepatitis B If you have a higher risk for hepatitis B, you should be screened for this virus. Talk with your health care provider to find out if you are at risk for hepatitis B infection. Hepatitis C Testing is recommended for: Everyone born from [birth year] through 1966. Anyone with known risk factors for hepatitis C. Sexually transmitted infections (STIs) Get screened for STIs, including gonorrhea and chlamydia, if: You are sexually active and are younger than 83 years of age. You are older than 83 years of age and your health care provider tells you that you are at risk for this type of infection. Your sexual activity has changed since you were last screened, and you are at increased risk for chlamydia or gonorrhea. Ask your health care provider if you are at risk. Ask your health care provider about whether you are at high risk for HIV. Your health care provider may recommend a prescription medicine to help prevent HIV  infection. If you choose to take medicine to prevent HIV, you should first get tested for HIV. You should then be tested every 3 months for as long as you are taking the medicine. Pregnancy If you are about to stop having your period (premenopausal) and you may become pregnant, seek counseling before you get pregnant. Take 400 to 800  micrograms (mcg) of folic acid every day if you become pregnant. Ask for birth control (contraception) if you want to prevent pregnancy. Osteoporosis and menopause Osteoporosis is a disease in which the bones lose minerals and strength with aging. This can result in bone fractures. If you are 58 years old or older, or if you are at risk for osteoporosis and fractures, ask your health care provider if you should: Be screened for bone loss. Take a calcium or vitamin D supplement to lower your risk of fractures. Be given hormone replacement therapy (HRT) to treat symptoms of menopause. Follow these instructions at home: Alcohol use Do not drink alcohol if: Your health care provider tells you not to drink. You are pregnant, may be pregnant, or are planning to become pregnant. If you drink alcohol: Limit how much you have to: 0-1 drink a day. Know how much alcohol is in your drink. In the [place] one drink equals one 12 oz bottle of beer (355 mL), one 5 oz glass of wine (148 mL), or one 1 oz glass of hard liquor (44 mL). Lifestyle Do not use any products that contain nicotine or tobacco. These products include cigarettes, chewing tobacco, and vaping devices, such as e-cigarettes. If you need help quitting, ask your health care provider. Do not use street drugs. Do not share needles. Ask your health care provider for help if you need support or information about quitting drugs. General instructions Schedule regular health, dental, and eye exams. Stay current with your vaccines. Tell your health care provider if: You often feel depressed. You have ever been abused or do not feel safe at home. Summary Adopting a healthy lifestyle and getting preventive care are important in promoting health and wellness. Follow your health care provider's instructions about healthy diet, exercising, and getting tested or screened for diseases. Follow your health care provider's instructions on monitoring your  cholesterol and blood pressure. This information is not intended to replace advice given to you by your health care provider. Make sure you discuss any questions you have with your health care provider. Document Revised: 07/25/2020 Document Reviewed: 07/25/2020 Elsevier Patient Education  2024 Elsevier Inc.  Health Maintenance After Age 45 After age 46, you are at a higher risk for certain long-term diseases and infections as well as injuries from falls. Falls are a major cause of broken bones and head injuries in people who are older than age 44. Getting regular preventive care can help to keep you healthy and well. Preventive care includes getting regular testing and making lifestyle changes as recommended by your health care provider. Talk with your health care provider about: Which screenings and tests you should have. A screening is a test that checks for a disease when you have no symptoms. A diet and exercise plan that is right for you. What should I know about screenings and tests to prevent falls? Screening and testing are the best ways to find a health problem early. Early diagnosis and treatment give you the best chance of managing medical conditions that are common after age 68. Certain conditions and lifestyle choices may make you more likely to have  a fall. Your health care provider may recommend: Regular vision checks. Poor vision and conditions such as cataracts can make you more likely to have a fall. If you wear glasses, make sure to get your prescription updated if your vision changes. Medicine review. Work with your health care provider to regularly review all of the medicines you are taking, including over-the-counter medicines. Ask your health care provider about any side effects that may make you more likely to have a fall. Tell your health care provider if any medicines that you take make you feel dizzy or sleepy. Strength and balance checks. Your health care provider may  recommend certain tests to check your strength and balance while standing, walking, or changing positions. Foot health exam. Foot pain and numbness, as well as not wearing proper footwear, can make you more likely to have a fall. Screenings, including: Osteoporosis screening. Osteoporosis is a condition that causes the bones to get weaker and break more easily. Blood pressure screening. Blood pressure changes and medicines to control blood pressure can make you feel dizzy. Depression screening. You may be more likely to have a fall if you have a fear of falling, feel depressed, or feel unable to do activities that you used to do. Alcohol use screening. Using too much alcohol can affect your balance and may make you more likely to have a fall. Follow these instructions at home: Lifestyle Do not drink alcohol if: Your health care provider tells you not to drink. If you drink alcohol: Limit how much you have to: 0-1 drink a day for women. 0-2 drinks a day for men. Know how much alcohol is in your drink. In the [place] one drink equals one 12 oz bottle of beer (355 mL), one 5 oz glass of wine (148 mL), or one 1 oz glass of hard liquor (44 mL). Do not use any products that contain nicotine or tobacco. These products include cigarettes, chewing tobacco, and vaping devices, such as e-cigarettes. If you need help quitting, ask your health care provider. Activity  Follow a regular exercise program to stay fit. This will help you maintain your balance. Ask your health care provider what types of exercise are appropriate for you. If you need a cane or walker, use it as recommended by your health care provider. Wear supportive shoes that have nonskid soles. Safety  Remove any tripping hazards, such as rugs, cords, and clutter. Install safety equipment such as grab bars in bathrooms and safety rails on stairs. Keep rooms and walkways well-lit. General instructions Talk with your health care provider  about your risks for falling. Tell your health care provider if: You fall. Be sure to tell your health care provider about all falls, even ones that seem minor. You feel dizzy, tiredness (fatigue), or off-balance. Take over-the-counter and prescription medicines only as told by your health care provider. These include supplements. Eat a healthy diet and maintain a healthy weight. A healthy diet includes low-fat dairy products, low-fat (lean) meats, and fiber from whole grains, beans, and lots of fruits and vegetables. Stay current with your vaccines. Schedule regular health, dental, and eye exams. Summary Having a healthy lifestyle and getting preventive care can help to protect your health and wellness after age 78. Screening and testing are the best way to find a health problem early and help you avoid having a fall. Early diagnosis and treatment give you the best chance for managing medical conditions that are more common for people who are older than  age 44. Falls are a major cause of broken bones and head injuries in people who are older than age 18. Take precautions to prevent a fall at home. Work with your health care provider to learn what changes you can make to improve your health and wellness and to prevent falls. This information is not intended to replace advice given to you by your health care provider. Make sure you discuss any questions you have with your health care provider.  Document Revised: 07/25/2020 Document Reviewed: 07/25/2020 Elsevier Patient Education  2024 [employer].

## 2023-02-04 NOTE — Progress Notes (Signed)
Subjective:   [name] is a 82 y.o. female who presents for Medicare Annual (Subsequent) preventive examination.  Visit Complete: Virtual I connected with  Rhegan Winne on 02/04/23 by a audio enabled telemedicine application and verified that I am speaking with the correct person using two identifiers.  Patient Location: Skilled Nursing Facility  Provider Location: Office/Clinic  I discussed the limitations of evaluation and management by telemedicine. The patient expressed understanding and agreed to proceed.  Vital Signs: Because this visit was a virtual/telehealth visit, some criteria may be missing or patient reported. Any vitals not documented were not able to be obtained and vitals that have been documented are patient reported.   Cardiac Risk Factors include: advanced age (>72men, >22 women);diabetes mellitus;hypertension;dyslipidemia;obesity (BMI >30kg/m2)     Objective:   Defer to PCP Today's Vitals   02/04/23 1005  BP: (!) 156/96  Pulse: 84  Weight: 149 lb (67.6 kg)  Height: 4\' 11"  (1.499 m)  Because this visit was a virtual/telehealth visit, some criteria may be missing or patient reported. Any vitals not documented were not able to be obtained and vitals that have been documented are patient reported.   Body mass index is 30.09 kg/m.     02/04/2023   10:20 AM 12/21/2022   10:12 PM 12/18/2021    1:47 PM 11/20/2021    1:17 AM 10/05/2011    9:16 AM  Advanced Directives  Does Patient Have a Medical Advance Directive? Yes No Yes No   Type of Estate agent of [employer] Power of [place];Living will    Copy of Healthcare Power of Attorney in Chart? No - copy requested  No - copy requested    Would patient like information on creating a medical advance directive?  No - Patient declined  No - Patient declined   Pre-existing out of facility DNR order (yellow form or pink MOST form)     No    Current Medications  (verified) Outpatient Encounter Medications as of 02/04/2023  Medication Sig   acetaminophen (TYLENOL) 500 MG tablet Take 500 mg by mouth as needed for mild pain.   AMBULATORY NON FORMULARY MEDICATION Folding walker with front wheels.  Use as needed. Disp 1 R26.89   AMBULATORY NON FORMULARY MEDICATION Folding walker with front wheels. Dispense 1 Dx code: R26.89 Use as needed   Blood Glucose Calibration (ACCU-CHEK GUIDE CONTROL) LIQD 1 Act by In Vitro route 2 (two) times daily as needed.   Blood Glucose Monitoring Suppl (ACCU-CHEK GUIDE ME) w/Device KIT 1 Act by Does not apply route 2 (two) times daily as needed.   diclofenac Sodium (VOLTAREN) 1 % GEL Apply 4 g topically 4 (four) times daily.   empagliflozin (JARDIANCE) 10 MG TABS tablet Take 1 tablet (10 mg total) by mouth daily before breakfast.   FLUoxetine (PROZAC) 40 MG capsule Take 1 capsule (40 mg total) by mouth daily.   glucose blood (ACCU-CHEK GUIDE) test strip 1 each by Other route 2 (two) times daily.   levothyroxine (SYNTHROID) 50 MCG tablet Take 1 tablet (50 mcg total) by mouth daily.   losartan (COZAAR) 100 MG tablet Take 1 tablet (100 mg total) by mouth daily.   memantine (NAMENDA XR) 28 MG CP24 24 hr capsule Take 1 capsule (28 mg total) by mouth daily.   rosuvastatin (CRESTOR) 10 MG tablet Take 1 tablet (10 mg total) by mouth daily.   sodium bicarbonate 650 MG tablet Take 1 tablet (650 mg total) by mouth 3 (  three) times daily.   No facility-administered encounter medications on file as of 02/04/2023.    Allergies (verified) Shellfish-derived products, Amoxicillin, Codeine sulfate, Hydrochlorothiazide, Iodine, Iohexol, Paroxetine, Penicillins, and Prednisone   History: Past Medical History:  Diagnosis Date   Allergic rhinitis    Anxiety    Arthritis    Chronic cough    Dr Sherene Sires, onset 11/2007, Sinus CT July 7,2010>neg, Resolved 10/2008 on ppi two times a day   Colon polyp    Depression    Diabetes mellitus type  II    diet   Diverticulosis    Dyslipidemia    GERD (gastroesophageal reflux disease)    Hiatal hernia    Hypertension    Hypothyroidism    Lung nodule    Obesity (BMI 30-39.9)    Pneumonia    Sleep apnea    Dr Shelle Iron CPAP 2009   Past Surgical History:  Procedure Laterality Date   ABDOMINAL HYSTERECTOMY     APPENDECTOMY     BLADDER SUSPENSION     CHOLECYSTECTOMY     1998 in [facility]   EYE SURGERY  10/13   and 12/13; cataract surgery   Left breast lumpectomy     NISSEN FUNDOPLICATION     March '11 Dr Michaell Cowing   RECTOCELE REPAIR     TONSILLECTOMY     x 2   VENTRAL HERNIA REPAIR     Family History  Problem Relation Age of Onset   Stroke Mother 56   Lung cancer Father    Breast cancer Neg Hx    Social History   Socioeconomic History   Marital status: Single    Spouse name: Not on file   Number of children: Not on file   Years of education: Not on file   Highest education level: Not on file  Occupational History   Occupation: retired Engineer, petroleum  Tobacco Use   Smoking status: Former    Current packs/day: 0.00    Average packs/day: 1.5 packs/day for 25.0 years (37.5 ttl pk-yrs)    Types: Cigarettes    Start date: 10/28/1956    Quit date: 10/28/1981    Years since quitting: 41.2   Smokeless tobacco: Never  Substance and Sexual Activity   Alcohol use: No   Drug use: No   Sexual activity: Not Currently  Other Topics Concern   Not on file  Social History Narrative   HSG. Married -divorced. 2 sons. 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 Patient does not get regular exercise. Loves to read. Strong faith. Lives alone.   Social Determinants of Health   Financial Resource Strain: Low Risk  (02/04/2023)   Overall Financial Resource Strain (CARDIA)    Difficulty of Paying Living Expenses: Not hard at all  Food Insecurity: No Food Insecurity (02/04/2023)   Hunger Vital Sign    Worried About Running Out of Food in the Last Year: Never true    Ran Out  of Food in the Last Year: Never true  Transportation Needs: No Transportation Needs (02/04/2023)   PRAPARE - Administrator, Civil Service (Medical): No    Lack of Transportation (Non-Medical): No  Physical Activity: Insufficiently Active (02/04/2023)   Exercise Vital Sign    Days of Exercise per Week: 3 days    Minutes of Exercise per Session: 30 min  Stress: No Stress Concern Present (02/04/2023)   [employer] of Occupational Health - Occupational Stress Questionnaire    Feeling of Stress : Not at all  Social Connections: Moderately Integrated (02/04/2023)   Social Connection and Isolation Panel [NHANES]    Frequency of Communication with Friends and Family: More than three times a week    Frequency of Social Gatherings with Friends and Family: More than three times a week    Attends Religious Services: More than 4 times per year    Active Member of [employer] or Organizations: Yes    Attends Engineer, structural: More than 4 times per year    Marital Status: Divorced    Tobacco Counseling Counseling given: Not Answered   Clinical Intake:  Pre-visit preparation completed: Yes  Pain : No/denies pain     BMI - recorded: 31.25 Nutritional Status: BMI > 30  Obese Nutritional Risks: None Diabetes: Yes CBG done?: Yes CBG resulted in Enter/ Edit results?: No Did pt. bring in CBG monitor from home?: No  How often do you need to have someone help you when you read instructions, pamphlets, or other written materials from your doctor or pharmacy?: 1 - Never What is the last grade level you completed in school?: master degree  Interpreter Needed?: No      Activities of Daily Living    02/04/2023   10:11 AM 12/22/2022    3:00 AM  In your present state of health, do you have any difficulty performing the following activities:  Hearing? 1 0  Comment having visit for it   Vision? 0 0  Difficulty concentrating or making decisions? 0 0  Walking or  climbing stairs? 0   Dressing or bathing? 0   Doing errands, shopping? 0 0  Preparing Food and eating ? N   Using the Toilet? N   In the past six months, have you accidently leaked urine? Y   Do you have problems with loss of bowel control? N   Managing your Medications? N   Managing your Finances? N   Housekeeping or managing your Housekeeping? N     Patient Care Team: [name], MD as PCP - General (Internal Medicine) Chilton Si, MD as PCP - Cardiology (Cardiology)  Indicate any recent Medical Services you may have received from other than [facility] providers in the past year (date may be approximate).     Assessment:   This is a routine wellness examination for [name].  Hearing/Vision screen Vision Screening - Comments:: [facility]    Goals Addressed               This Visit's Progress     Patient Stated (pt-stated)        Pt is looking for an apartment and hopefull get it       Depression Screen    02/04/2023   10:18 AM 11/29/2022    1:08 PM 06/25/2022    2:37 PM 05/29/2022    1:47 PM 12/18/2021    1:44 PM 05/22/2021    1:05 PM 09/08/2020    3:16 PM  PHQ 2/9 Scores  PHQ - 2 Score 0 1 0 0 0 0 0  PHQ- 9 Score  1 0   0 0    Fall Risk    02/04/2023   10:21 AM 06/25/2022    2:27 PM 05/29/2022    1:47 PM 12/18/2021    1:43 PM 09/14/2021    1:35 PM  Fall Risk   Falls in the past year? 0 0 0 0 0  Number falls in past yr: 0 0 0 0   Injury with Fall? 0  0 0 0   Risk for fall due to : No Fall Risks  No Fall Risks No Fall Risks   Follow up Falls evaluation completed Falls evaluation completed Falls evaluation completed Falls prevention discussed     MEDICARE RISK AT HOME: Medicare Risk at Home Any stairs in or around the home?: Yes (have elevator) If so, are there any without handrails?: No Home free of loose throw rugs in walkways, pet beds, electrical cords, etc?: Yes Adequate lighting in your home to reduce risk of falls?: Yes Life alert?: Yes Use  of a cane, walker or w/c?: Yes (rollator walker) Grab bars in the bathroom?: Yes Shower chair or bench in shower?: Yes Elevated toilet seat or a handicapped toilet?: No  TIMED UP AND GO:  Was the test performed?  No    Cognitive Function:    11/29/2014    1:20 PM  MMSE - Mini Mental State Exam  Orientation to time 5  Orientation to Place 5  Registration 3  Attention/ Calculation 5  Recall 3  Language- name 2 objects 2  Language- repeat 1  Language- follow 3 step command 3  Language- read & follow direction 1  Write a sentence 1  Copy design 1  Total score 30        02/04/2023   10:28 AM 12/18/2021    1:46 PM  6CIT Screen  What Year? 0 points 0 points  What month? -- 0 points  What time? 0 points 0 points  Count back from 20 0 points 0 points  Months in reverse 0 points 0 points  Repeat phrase 0 points 2 points  Total Score  2 points    Immunizations Immunization History  Administered Date(s) Administered   Fluad Quad(high Dose 65+) 01/19/2020   Fluad Trivalent(High Dose 65+) 11/29/2022   Influenza Split 12/15/2010, 01/24/2012   Influenza Whole 01/07/2006, 01/15/2007, 12/18/2007, 12/16/2008, 01/24/2010   Influenza, High Dose Seasonal PF 12/04/2013, 01/18/2022   Influenza,inj,Quad PF,6+ Mos 12/08/2012   Influenza-Unspecified 01/08/2016, 01/17/2021   Moderna Sars-Covid-2 Vaccination 03/30/2019, 04/27/2019, 01/14/2020   Pfizer(Comirnaty)Fall Seasonal Vaccine 12 years and older 01/22/2023   Pneumococcal Conjugate-13 12/04/2013   Pneumococcal Polysaccharide-23 12/16/2008, 07/24/2021   Td 04/25/2009   Zoster Recombinant(Shingrix) 08/26/2021, 01/18/2022   Zoster, Live 12/04/2013    TDAP status: Due, Education has been provided regarding the importance of this vaccine. Advised may receive this vaccine at local pharmacy or Health Dept. Aware to provide a copy of the vaccination record if obtained from local pharmacy or Health Dept. Verbalized acceptance and  understanding.  Flu Vaccine status: Up to date  Pneumococcal vaccine status: Up to date  Covid-19 vaccine status: Completed vaccines  Qualifies for Shingles Vaccine? Yes   Zostavax completed Yes   Shingrix Completed?: Yes  Screening Tests Health Maintenance  Topic Date Due   DTaP/Tdap/Td (2 - Tdap) 04/26/2019   OPHTHALMOLOGY EXAM  01/04/2023   Diabetic kidney evaluation - Urine ACR  02/16/2023   COVID-19 Vaccine (5 - 2023-24 season) 05/22/2023   FOOT EXAM  11/29/2023   Diabetic kidney evaluation - eGFR measurement  12/24/2023   Medicare Annual Wellness (AWV)  02/04/2024   Pneumonia Vaccine 41+ Years old  Completed   INFLUENZA VACCINE  Completed   DEXA SCAN  Completed   Zoster Vaccines- Shingrix  Completed   HPV VACCINES  Aged Out    Health Maintenance  Health Maintenance Due  Topic Date Due   DTaP/Tdap/Td (2 - Tdap) 04/26/2019   OPHTHALMOLOGY EXAM  01/04/2023   Diabetic kidney evaluation - Urine ACR  02/16/2023    Colorectal cancer screening: Type of screening: Colonoscopy. Completed 10/01/2006. Repeat every N/A years  Mammogram status: No longer required due to age.  Bone Density status: Completed 12/29/2014. Results reflect: Bone density results: NORMAL. Repeat every 2 years.  Lung Cancer Screening: (Low Dose CT Chest recommended if Age 49-80 years, 20 pack-year currently smoking OR have quit w/in 15years.) does not qualify.   Lung Cancer Screening Referral: N/A  Additional Screening:  Hepatitis C Screening: does not qualify; Completed N/A  Vision Screening: Recommended annual ophthalmology exams for early detection of glaucoma and other disorders of the eye. Is the patient up to date with their annual eye exam?  Yes  Who is the provider or what is the name of the office in which the patient attends annual eye exams? [facility] If pt is not established with a provider, would they like to be referred to a provider to establish care? No .   Dental Screening:  Recommended annual dental exams for proper oral hygiene  Nutrition Risk Assessment:  Has the patient had any N/V/D within the last 2 months?  No  Does the patient have any non-healing wounds?  No  Has the patient had any unintentional weight loss or weight gain?  No   Diabetes:  Is the patient diabetic?  Yes  If diabetic, was a CBG obtained today?  No  Did the patient bring in their glucometer from home?  No  How often do you monitor your CBG's? 3 to 4 times a week .   Financial Strains and Diabetes Management:  Are you having any financial strains with the device, your supplies or your medication? No .  Does the patient want to be seen by Chronic Care Management for management of their diabetes?  No  Would the patient like to be referred to a Nutritionist or for Diabetic Management?  No   Diabetic Exams:  Diabetic Eye Exam: Overdue for diabetic eye exam. Pt has been advised about the importance in completing this exam. Patient advised to call and schedule an eye exam. Diabetic Foot Exam: Completed 11/29/2022  Diabetic Foot Exam: Diabetic Foot Exam: Completed 11/29/22  Community Resource Referral / Chronic Care Management: CRR required this visit?  No   CCM required this visit?  No     Plan:     I have personally reviewed and noted the following in the patient's chart:   Medical and social history Use of alcohol, tobacco or illicit drugs  Current medications and supplements including opioid prescriptions. Patient is not currently taking opioid prescriptions. Functional ability and status Nutritional status Physical activity Advanced directives List of other physicians Hospitalizations, surgeries, and ER visits in previous 12 months Vitals Screenings to include cognitive, depression, and falls Referrals and appointments  In addition, I have reviewed and discussed with patient certain preventive protocols, quality metrics, and best practice recommendations. A  written personalized care plan for preventive services as well as general preventive health recommendations were provided to patient.     [name], CMA   02/04/2023   After Visit Summary: (MyChart) Due to this being a telephonic visit, the after visit summary with patients personalized plan was offered to patient via MyChart   Nurse Notes:  [name] , Thank you for taking time to come for your Medicare Wellness Visit. I appreciate your ongoing commitment to your health goals. Please review the following plan we discussed and let  me know if I can assist you in the future.   These are the goals we discussed:  Goals       DIET - INCREASE WATER INTAKE      Patient Stated (pt-stated)      Pt is looking for an apartment and hopefull get it         This is a list of the screening recommended for you and due dates:  Health Maintenance  Topic Date Due   DTaP/Tdap/Td vaccine (2 - Tdap) 04/26/2019   Eye exam for diabetics  01/04/2023   Yearly kidney health urinalysis for diabetes  02/16/2023   COVID-19 Vaccine (5 - 2023-24 season) 05/22/2023   Complete foot exam   11/29/2023   Yearly kidney function blood test for diabetes  12/24/2023   Medicare Annual Wellness Visit  02/04/2024   Pneumonia Vaccine  Completed   Flu Shot  Completed   DEXA scan (bone density measurement)  Completed   Zoster (Shingles) Vaccine  Completed   HPV Vaccine  Aged Out

## 2023-02-05 ENCOUNTER — Ambulatory Visit: Payer: Medicare HMO | Admitting: Family Medicine

## 2023-02-05 ENCOUNTER — Other Ambulatory Visit: Payer: Self-pay

## 2023-02-05 VITALS — BP 152/92 | HR 72 | Ht 59.0 in | Wt 156.0 lb

## 2023-02-05 DIAGNOSIS — G8929 Other chronic pain: Secondary | ICD-10-CM

## 2023-02-05 DIAGNOSIS — M25561 Pain in right knee: Secondary | ICD-10-CM

## 2023-02-05 DIAGNOSIS — M170 Bilateral primary osteoarthritis of knee: Secondary | ICD-10-CM

## 2023-02-05 DIAGNOSIS — M25562 Pain in left knee: Secondary | ICD-10-CM

## 2023-02-05 NOTE — Patient Instructions (Signed)
Thank you for coming in today.   We can repeat the gel shots around Jan 16. Let us know ahead of time so we can be sure to be ready.   Recheck as needed.

## 2023-02-05 NOTE — Progress Notes (Signed)
Rubin Payor, PhD, LAT, ATC acting as a scribe for Clementeen Graham, MD.  [name] is a 82 y.o. female who presents to [facility] today for cont'd bilat knee pain. Pt was last seen by Dr. Denyse Amass on 10/02/22 and completed the [place] series, 3/3, bilaterally. Last Zilretta injections were on 06/25/22.  Today, pt reports she had a little "heart episode" and was admitted to the hospital. She notes her knees are painful again. She got much relief from the gel injections. Bliat knee pain just returning about a week ago.   Dx imaging: 08/08/20 R knee XR  01/16/20 L knee XR  Pertinent review of systems: No fevers or chills  Relevant historical information: Now on oxygen 1 L via nasal cannula chronically   Exam:  BP (!) 152/92   Pulse 72   Ht 4\' 11"  (1.499 m)   Wt 156 lb (70.8 kg)   SpO2 97%   BMI 31.51 kg/m  General: Well Developed, well nourished, and in no acute distress.   MSK: Knees bilaterally minimal effusion normal motion with crepitation.    Lab and Radiology Results  Procedure: Real-time Ultrasound Guided Injection of right knee joint superior lateral patella space Device: Philips Affiniti 50G/GE Logiq Images permanently stored and available for review in PACS Verbal informed consent obtained.  Discussed risks and benefits of procedure. Warned about infection, bleeding, hyperglycemia damage to structures among others. Patient expresses understanding and agreement Time-out conducted.   Noted no overlying erythema, induration, or other signs of local infection.   Skin prepped in a sterile fashion.   Local anesthesia: Topical Ethyl chloride.   With sterile technique and under real time ultrasound guidance: 40 mg of Kenalog and 2 mL of Marcaine injected into knee joint. Fluid seen entering the joint capsule.   Completed without difficulty   Pain immediately resolved suggesting accurate placement of the medication.   Advised to call if  fevers/chills, erythema, induration, drainage, or persistent bleeding.   Images permanently stored and available for review in the ultrasound unit.  Impression: Technically successful ultrasound guided injection.   Procedure: Real-time Ultrasound Guided Injection of left knee joint superior lateral patella space Device: Philips Affiniti 50G/GE Logiq Images permanently stored and available for review in PACS Verbal informed consent obtained.  Discussed risks and benefits of procedure. Warned about infection, bleeding, hyperglycemia damage to structures among others. Patient expresses understanding and agreement Time-out conducted.   Noted no overlying erythema, induration, or other signs of local infection.   Skin prepped in a sterile fashion.   Local anesthesia: Topical Ethyl chloride.   With sterile technique and under real time ultrasound guidance: 40 mg of Kenalog and 2 mL of Marcaine injected into knee joint. Fluid seen entering the joint capsule.   Completed without difficulty   Pain immediately resolved suggesting accurate placement of the medication.   Advised to call if fevers/chills, erythema, induration, drainage, or persistent bleeding.   Images permanently stored and available for review in the ultrasound unit.  Impression: Technically successful ultrasound guided injection.       Assessment and Plan: 82 y.o. female with bilateral knee pain due to DJD.  This is a chronic problem with acute exacerbation.  Plan for steroid injection today.  Her last gel injection was July 16.  6 months from that would be around January 16.  She will give [place] a warning ahead of time prior to those injections if needed.   PDMP not reviewed this  encounter. Orders Placed This Encounter  Procedures   [place] LIMITED JOINT SPACE STRUCTURES LOW BILAT(NO LINKED CHARGES)    Order Specific Question:   Reason for Exam (SYMPTOM  OR DIAGNOSIS REQUIRED)    Answer:   bilateral knee pain    Order Specific  Question:   Preferred imaging location?    Answer:   [facility]   No orders of the defined types were placed in this encounter.    Discussed warning signs or symptoms. Please see discharge instructions. Patient expresses understanding.   The above documentation has been reviewed and is accurate and complete Clementeen Graham, M.D.

## 2023-02-10 ENCOUNTER — Other Ambulatory Visit: Payer: Self-pay | Admitting: Internal Medicine

## 2023-02-10 DIAGNOSIS — G301 Alzheimer's disease with late onset: Secondary | ICD-10-CM

## 2023-02-12 ENCOUNTER — Other Ambulatory Visit: Payer: Self-pay | Admitting: Internal Medicine

## 2023-02-12 DIAGNOSIS — Z1231 Encounter for screening mammogram for malignant neoplasm of breast: Secondary | ICD-10-CM

## 2023-02-19 ENCOUNTER — Ambulatory Visit: Payer: Medicare HMO

## 2023-02-24 ENCOUNTER — Other Ambulatory Visit: Payer: Self-pay

## 2023-02-24 ENCOUNTER — Emergency Department (HOSPITAL_BASED_OUTPATIENT_CLINIC_OR_DEPARTMENT_OTHER): Payer: Medicare HMO

## 2023-02-24 ENCOUNTER — Emergency Department (HOSPITAL_BASED_OUTPATIENT_CLINIC_OR_DEPARTMENT_OTHER)
Admission: EM | Admit: 2023-02-24 | Discharge: 2023-02-24 | Disposition: A | Discharge status: Home / Self Care | Payer: Medicare HMO | Attending: Emergency Medicine | Admitting: Emergency Medicine

## 2023-02-24 ENCOUNTER — Encounter (HOSPITAL_BASED_OUTPATIENT_CLINIC_OR_DEPARTMENT_OTHER): Payer: Self-pay | Admitting: Emergency Medicine

## 2023-02-24 DIAGNOSIS — Z79899 Other long term (current) drug therapy: Secondary | ICD-10-CM | POA: Insufficient documentation

## 2023-02-24 DIAGNOSIS — E119 Type 2 diabetes mellitus without complications: Secondary | ICD-10-CM | POA: Diagnosis not present

## 2023-02-24 DIAGNOSIS — B349 Viral infection, unspecified: Secondary | ICD-10-CM | POA: Diagnosis not present

## 2023-02-24 DIAGNOSIS — U071 COVID-19: Secondary | ICD-10-CM | POA: Diagnosis not present

## 2023-02-24 DIAGNOSIS — R059 Cough, unspecified: Secondary | ICD-10-CM | POA: Diagnosis present

## 2023-02-24 DIAGNOSIS — B338 Other specified viral diseases: Secondary | ICD-10-CM

## 2023-02-24 LAB — CBC WITH DIFFERENTIAL/PLATELET
Abs Immature Granulocytes: 0.06 10*3/uL (ref 0.00–0.07)
Basophils Absolute: 0 10*3/uL (ref 0.0–0.1)
Basophils Relative: 0 %
Eosinophils Absolute: 0.1 10*3/uL (ref 0.0–0.5)
Eosinophils Relative: 1 %
HCT: 36.6 % (ref 36.0–46.0)
Hemoglobin: 12.1 g/dL (ref 12.0–15.0)
Immature Granulocytes: 1 %
Lymphocytes Relative: 23 %
Lymphs Abs: 2.6 10*3/uL (ref 0.7–4.0)
MCH: 30 pg (ref 26.0–34.0)
MCHC: 33.1 g/dL (ref 30.0–36.0)
MCV: 90.6 fL (ref 80.0–100.0)
Monocytes Absolute: 0.8 10*3/uL (ref 0.1–1.0)
Monocytes Relative: 8 %
Neutro Abs: 7.5 10*3/uL (ref 1.7–7.7)
Neutrophils Relative %: 67 %
Platelets: 199 10*3/uL (ref 150–400)
RBC: 4.04 MIL/uL (ref 3.87–5.11)
RDW: 16.4 % — ABNORMAL HIGH (ref 11.5–15.5)
WBC: 11.1 10*3/uL — ABNORMAL HIGH (ref 4.0–10.5)
nRBC: 0 % (ref 0.0–0.2)

## 2023-02-24 LAB — BASIC METABOLIC PANEL
Anion gap: 9 (ref 5–15)
BUN: 24 mg/dL — ABNORMAL HIGH (ref 8–23)
CO2: 25 mmol/L (ref 22–32)
Calcium: 8.7 mg/dL — ABNORMAL LOW (ref 8.9–10.3)
Chloride: 104 mmol/L (ref 98–111)
Creatinine, Ser: 0.76 mg/dL (ref 0.44–1.00)
GFR, Estimated: >60 mL/min (ref >60)
Glucose, Bld: 70 mg/dL (ref 70–99)
Potassium: 4 mmol/L (ref 3.5–5.1)
Sodium: 138 mmol/L (ref 135–145)

## 2023-02-24 LAB — RESP PANEL BY RT-PCR (RSV, FLU A&B, COVID)  RVPGX2
Influenza A by PCR: NEGATIVE
Influenza B by PCR: NEGATIVE
Resp Syncytial Virus by PCR: POSITIVE — AB
SARS Coronavirus 2 by RT PCR: POSITIVE — AB

## 2023-02-24 MED ORDER — MOLNUPIRAVIR EUA 200MG CAPSULE: 4.0000 | ORAL_CAPSULE | Freq: Two times a day (BID) | ORAL | 0 refills | Status: AC

## 2023-02-24 MED ORDER — ACETAMINOPHEN 325 MG PO TABS
650.0000 mg | ORAL_TABLET | Freq: Once | ORAL | Status: AC
  Administered 2023-02-24: 650 mg via ORAL
  Filled 2023-02-24: qty 2

## 2023-02-24 NOTE — ED Triage Notes (Signed)
Last Saturday was dx sinus infection, has finished zpack. Past 2 days coughing,sore throat,tired.,these are new symptoms.

## 2023-02-24 NOTE — ED Provider Notes (Signed)
[place] EMERGENCY DEPARTMENT AT [facility] Provider Note   CSN: [id number] Arrival date & time: 02/24/23  1252     History  Chief Complaint  Patient presents with   Cough    [name] is a 82 y.o. female, history of OSA, diabetes type 2, who presents to the ED secondary to sore throat, nonproductive cough, and slight shortness of breath, has been going on for the last 2 days.  She states she was treated for sinus infection with a Z-Pak, but 7 days ago, and she improved, but about 2 days ago she started feeling a little bit worse and started having muscle aches, short slight shortness of breath, and nonproductive cough.  She states she feels achy all over, and just tired.  Has been eating and drinking appropriately.  Denies any chest pain, nausea, vomiting, or diarrhea.     Home Medications Prior to Admission medications   Medication Sig Start Date End Date Taking? Authorizing Provider  acetaminophen (TYLENOL) 500 MG tablet Take 500 mg by mouth as needed for mild pain.    [provider]  AMBULATORY NON FORMULARY MEDICATION Folding walker with front wheels.  Use as needed. Disp 1 R26.89 05/22/21   Rodolph Bong, MD  AMBULATORY NON FORMULARY MEDICATION Folding walker with front wheels. Dispense 1 Dx code: R26.89 Use as needed 06/21/21   Rodolph Bong, MD  Blood Glucose Calibration (ACCU-CHEK GUIDE CONTROL) LIQD 1 Act by In Vitro route 2 (two) times daily as needed. 04/14/21   [name], MD  Blood Glucose Monitoring Suppl (ACCU-CHEK GUIDE ME) w/Device KIT 1 Act by Does not apply route 2 (two) times daily as needed. 04/14/21   [name], MD  diclofenac Sodium (VOLTAREN) 1 % GEL Apply 4 g topically 4 (four) times daily. 05/11/20   Rodolph Bong, MD  empagliflozin (JARDIANCE) 10 MG TABS tablet Take 1 tablet (10 mg total) by mouth daily before breakfast. 11/29/22   [name], MD  FLUoxetine (PROZAC) 40 MG capsule Take 1 capsule (40 mg total) by mouth  daily. 11/29/22   [name], MD  glucose blood (ACCU-CHEK GUIDE) test strip 1 each by Other route 2 (two) times daily. 04/14/21   [name], MD  levothyroxine (SYNTHROID) 50 MCG tablet Take 1 tablet (50 mcg total) by mouth daily. 08/30/22   [name], MD  losartan (COZAAR) 100 MG tablet Take 1 tablet (100 mg total) by mouth daily. 11/29/22   [name], MD  memantine (NAMENDA XR) 28 MG CP24 24 hr capsule Take 1 capsule (28 mg total) by mouth daily. 01/16/23   [name], MD  molnupiravir EUA (LAGEVRIO) 200 mg CAPS capsule Take 4 capsules (800 mg total) by mouth 2 (two) times daily for 5 days. 02/24/23 03/01/23 Yes Tian Davison L, PA  rosuvastatin (CRESTOR) 10 MG tablet Take 1 tablet (10 mg total) by mouth daily. 11/29/22   [name], MD  sodium bicarbonate 650 MG tablet Take 1 tablet (650 mg total) by mouth 3 (three) times daily. 12/23/22   Glade Lloyd, MD      Allergies    Shellfish-derived products, Amoxicillin, Codeine sulfate, Hydrochlorothiazide, Iodine, Iohexol, Paroxetine, Penicillins, and Prednisone    Review of Systems   Review of Systems  Respiratory:  Positive for cough.   Cardiovascular:  Negative for chest pain.    Physical Exam Updated Vital Signs BP (!) 148/57 (BP Location: Right Arm)   Pulse 70  Temp 98 F (36.7 C) (Oral)   Resp 16   Wt 70.8 kg   SpO2 100%   BMI 31.51 kg/m  Physical Exam Vitals and nursing note reviewed.  Constitutional:      General: She is not in acute distress.    Appearance: She is well-developed.  HENT:     Head: Normocephalic and atraumatic.     Right Ear: Tympanic membrane normal.     Left Ear: Tympanic membrane normal.     Nose: Rhinorrhea present.  Eyes:     Conjunctiva/sclera: Conjunctivae normal.  Cardiovascular:     Rate and Rhythm: Normal rate and regular rhythm.     Heart sounds: No murmur heard. Pulmonary:     Effort: Pulmonary effort is normal. No respiratory distress.     Breath sounds:  Normal breath sounds.  Abdominal:     Palpations: Abdomen is soft.     Tenderness: There is no abdominal tenderness.  Musculoskeletal:        General: No swelling.     Cervical back: Neck supple.  Skin:    General: Skin is warm and dry.     Capillary Refill: Capillary refill takes less than 2 seconds.  Neurological:     Mental Status: She is alert.  Psychiatric:        Mood and Affect: Mood normal.     ED Results / Procedures / Treatments   Labs (all labs ordered are listed, but only abnormal results are displayed) Labs Reviewed  RESP PANEL BY RT-PCR (RSV, FLU A&B, COVID)  RVPGX2 - Abnormal; Notable for the following components:      Result Value   SARS Coronavirus 2 by RT PCR POSITIVE (*)    Resp Syncytial Virus by PCR POSITIVE (*)    All other components within normal limits  BASIC METABOLIC PANEL - Abnormal; Notable for the following components:   BUN 24 (*)    Calcium 8.7 (*)    All other components within normal limits  CBC WITH DIFFERENTIAL/PLATELET - Abnormal; Notable for the following components:   WBC 11.1 (*)    RDW 16.4 (*)    All other components within normal limits    EKG None  Radiology DG Chest Portable 1 View  Result Date: 02/24/2023 CLINICAL DATA:  Shortness of breath, cough EXAM: PORTABLE CHEST 1 VIEW COMPARISON:  Chest radiograph dated 12/21/2022. FINDINGS: The heart size and mediastinal contours are within normal limits. Both lungs are clear. The visualized skeletal structures are unremarkable. IMPRESSION: No active disease. Electronically Signed   By: Romona Curls M.D.   On: 02/24/2023 15:45    Procedures Procedures    Medications Ordered in ED Medications  acetaminophen (TYLENOL) tablet 650 mg (650 mg Oral Given 02/24/23 1440)    ED Course/ Medical Decision Making/ A&P                                 Medical Decision Making Patient is an 82 year old female, who is well-appearing, she is here for slight shortness of breath, nonproductive  cough, as well as a sore throat this been going on for the last 2 days.  Recently was treated with a Z-Pak, for sinus infection, and improved, but about 2 weeks, but about 2 days ago, got no symptoms.  We will test cervical for COVID/flu, as well as a chest x-ray.  She denies any chest pain, nausea, vomiting, or diarrhea at this time  Amount and/or Complexity of Data Reviewed Labs: ordered.    Details: COVID-positive, RSV positive, mildly elevated BUN Radiology: ordered.    Details: Chest x-ray clear Discussion of management or test interpretation with external provider(s): Discussed with the patient, her chest x-ray is clear, but she is COVID and RSV positive, she would like to be treated for COVID, I spoke to the pharmacist, and they recommended treating the patient with Molnupiravir since her COVID is fairly new.  I prescribed her this medication, as well as discussed return precautions and she voiced understanding, she is not hypoxic on exam, and she is overall well-appearing, just fatigued  Risk OTC drugs.    Final Clinical Impression(s) / ED Diagnoses Final diagnoses:  COVID-19  RSV (respiratory syncytial virus infection)    Rx / DC Orders ED Discharge Orders          Ordered    molnupiravir EUA (LAGEVRIO) 200 mg CAPS capsule  2 times daily        02/24/23 1722              Jake Fuhrmann L, PA 02/24/23 1726    Anders Simmonds T, DO 02/24/23 Rickey Primus

## 2023-02-24 NOTE — Discharge Instructions (Addendum)
You have COVID-19, I prescribed you medication, to help get you better, and reduce your risk of complications with COVID-19.  Please take this as directed.  Speak to the pharmacist in regards to this.  I have spoken to the pharmacist, and they stated that your medication should be okay on this drug.  Return if you have worsening shortness of breath, cough, or chest pain.

## 2023-03-01 ENCOUNTER — Telehealth: Payer: Self-pay | Admitting: Internal Medicine

## 2023-03-01 NOTE — Telephone Encounter (Signed)
Patient was diagnosed with Covid and RSV at the ED on 02/24/23. She said she is not feeling any better and would like to know what to do. Best callback is [phone].

## 2023-03-01 NOTE — Telephone Encounter (Signed)
Advised patient to return back to the ER. She is still feeling horrible. I advise I would check back in with her.

## 2023-03-04 NOTE — Telephone Encounter (Signed)
Patient states that she wasn't able to go back to the ED and she doesn't have a ride to get to her appointment. She states that she will go to [employer] because that's the day her facility takes people places. I advised her to give [place] a call back if she needs anything, she gave a verbal understanding.   Just a FYI for you

## 2023-03-08 ENCOUNTER — Other Ambulatory Visit: Payer: Self-pay | Admitting: Internal Medicine

## 2023-03-08 DIAGNOSIS — I5032 Chronic diastolic (congestive) heart failure: Secondary | ICD-10-CM

## 2023-03-12 ENCOUNTER — Ambulatory Visit: Payer: Medicare HMO | Admitting: Internal Medicine

## 2023-03-12 ENCOUNTER — Encounter: Payer: Self-pay | Admitting: Internal Medicine

## 2023-03-12 ENCOUNTER — Ambulatory Visit (INDEPENDENT_AMBULATORY_CARE_PROVIDER_SITE_OTHER): Payer: Medicare HMO

## 2023-03-12 VITALS — BP 124/76 | HR 70 | Temp 98.3°F | Ht 59.0 in | Wt 157.0 lb

## 2023-03-12 DIAGNOSIS — Z7984 Long term (current) use of oral hypoglycemic drugs: Secondary | ICD-10-CM | POA: Diagnosis not present

## 2023-03-12 DIAGNOSIS — R051 Acute cough: Secondary | ICD-10-CM

## 2023-03-12 DIAGNOSIS — R062 Wheezing: Secondary | ICD-10-CM | POA: Diagnosis not present

## 2023-03-12 DIAGNOSIS — R059 Cough, unspecified: Secondary | ICD-10-CM | POA: Insufficient documentation

## 2023-03-12 DIAGNOSIS — E039 Hypothyroidism, unspecified: Secondary | ICD-10-CM

## 2023-03-12 DIAGNOSIS — R5383 Other fatigue: Secondary | ICD-10-CM | POA: Diagnosis not present

## 2023-03-12 DIAGNOSIS — E118 Type 2 diabetes mellitus with unspecified complications: Secondary | ICD-10-CM | POA: Diagnosis not present

## 2023-03-12 DIAGNOSIS — J9611 Chronic respiratory failure with hypoxia: Secondary | ICD-10-CM

## 2023-03-12 LAB — HEPATIC FUNCTION PANEL
ALT: 24 U/L (ref 0–35)
AST: 17 U/L (ref 0–37)
Albumin: 4.1 g/dL (ref 3.5–5.2)
Alkaline Phosphatase: 71 U/L (ref 39–117)
Bilirubin, Direct: 0.1 mg/dL (ref 0.0–0.3)
Total Bilirubin: 0.7 mg/dL (ref 0.2–1.2)
Total Protein: 7.4 g/dL (ref 6.0–8.3)

## 2023-03-12 LAB — CBC WITH DIFFERENTIAL/PLATELET
Basophils Absolute: 0 10*3/uL (ref 0.0–0.1)
Basophils Relative: 0.5 % (ref 0.0–3.0)
Eosinophils Absolute: 0 10*3/uL (ref 0.0–0.7)
Eosinophils Relative: 0.1 % (ref 0.0–5.0)
HCT: 43.7 % (ref 36.0–46.0)
Hemoglobin: 14.4 g/dL (ref 12.0–15.0)
Lymphocytes Relative: 19.5 % (ref 12.0–46.0)
Lymphs Abs: 1.9 10*3/uL (ref 0.7–4.0)
MCHC: 33 g/dL (ref 30.0–36.0)
MCV: 93.3 fL (ref 78.0–100.0)
Monocytes Absolute: 0.7 10*3/uL (ref 0.1–1.0)
Monocytes Relative: 7.7 % (ref 3.0–12.0)
Neutro Abs: 6.9 10*3/uL (ref 1.4–7.7)
Neutrophils Relative %: 72.2 % (ref 43.0–77.0)
Platelets: 256 10*3/uL (ref 150.0–400.0)
RBC: 4.69 Mil/uL (ref 3.87–5.11)
RDW: 16.8 % — ABNORMAL HIGH (ref 11.5–15.5)
WBC: 9.5 10*3/uL (ref 4.0–10.5)

## 2023-03-12 LAB — BASIC METABOLIC PANEL
BUN: 20 mg/dL (ref 6–23)
CO2: 25 meq/L (ref 19–32)
Calcium: 8.9 mg/dL (ref 8.4–10.5)
Chloride: 103 meq/L (ref 96–112)
Creatinine, Ser: 0.86 mg/dL (ref 0.40–1.20)
GFR: 62.32 mL/min (ref >60.00)
Glucose, Bld: 99 mg/dL (ref 70–99)
Potassium: 3.7 meq/L (ref 3.5–5.1)
Sodium: 138 meq/L (ref 135–145)

## 2023-03-12 LAB — HEMOGLOBIN A1C: Hgb A1c MFr Bld: 6.2 % (ref 4.6–6.5)

## 2023-03-12 LAB — TSH: TSH: 1.13 u[IU]/mL (ref 0.35–5.50)

## 2023-03-12 MED ORDER — METHYLPREDNISOLONE 4 MG PO TBPK: ORAL_TABLET | ORAL | 0 refills | Status: DC

## 2023-03-12 MED ORDER — LEVOFLOXACIN 500 MG PO TABS: 500.0000 mg | ORAL_TABLET | Freq: Every day | ORAL | 0 refills | Status: DC

## 2023-03-12 MED ORDER — ALBUTEROL SULFATE HFA 108 (90 BASE) MCG/ACT IN AERS: 2.0000 | INHALATION_SPRAY | Freq: Four times a day (QID) | RESPIRATORY_TRACT | 0 refills | Status: DC | PRN

## 2023-03-12 NOTE — Progress Notes (Signed)
Patient ID: [name], female   DOB: 18-Mar-1941, 82 y.o.   MRN: [id number]        Chief Complaint: follow up recent dual infection rsv covid, worsening cough sob doe, chronic resp failure, dm       HPI:  [name] is a 82 y.o. female here with hx of chronic resp failure on home o2 2-3L, with recent rsv covid infection 2 wks ago and seemed to improve, now worsening 2-3 days feverish, prod cough, worsening sob doe over baseline and Pt denies chest pain, orthopnea, PND, increased LE swelling, palpitations, dizziness or syncope.  Today is first time out of her room since her ED visit.  Was tx with molnupiravir and completed well  Has cardiology appt soon with recent low HR.         Wt Readings from Last 3 Encounters:  03/12/23 157 lb (71.2 kg)  02/24/23 156 lb (70.8 kg)  02/05/23 156 lb (70.8 kg)   BP Readings from Last 3 Encounters:  03/12/23 124/76  02/24/23 (!) 145/61  02/05/23 (!) 152/92         Past Medical History:  Diagnosis Date   Allergic rhinitis    Anxiety    Arthritis    Chronic cough    Dr Sherene Sires, onset 11/2007, Sinus CT July 7,2010>neg, Resolved 10/2008 on ppi two times a day   Colon polyp    Depression    Diabetes mellitus type II    diet   Dyslipidemia    Hiatal hernia    Hypertension    Hypothyroidism    Lung nodule    Obesity (BMI 30-39.9)    Pneumonia    Sleep apnea    Dr Shelle Iron CPAP 2009   Past Surgical History:  Procedure Laterality Date   ABDOMINAL HYSTERECTOMY     APPENDECTOMY     BLADDER SUSPENSION     CHOLECYSTECTOMY     1998 in [facility]   EYE SURGERY  10/13   and 12/13; cataract surgery   Left breast lumpectomy     NISSEN FUNDOPLICATION     March '11 Dr Michaell Cowing   RECTOCELE REPAIR     TONSILLECTOMY     x 2   VENTRAL HERNIA REPAIR      reports that she quit smoking about 41 years ago. Her smoking use included cigarettes. She started smoking about 66 years ago. She has a 37.5 pack-year smoking history. She has never used smokeless  tobacco. She reports that she does not drink alcohol and does not use drugs. family history includes Lung cancer in her father; Stroke (age of onset: 12) in her mother. Allergies  Allergen Reactions   Shellfish-Derived Products Anaphylaxis   Amoxicillin Other (See Comments)    GI Issues.   Codeine Sulfate    Hydrochlorothiazide    Iodine    Iohexol      Code: HIVES, Desc: PATIENT STATES SHE BREAKS OUT IN HIVES FROM IV DYE 02/27/08/RM, Onset Date: [id number]    Paroxetine    Penicillins    Prednisone    Current Outpatient Medications on File Prior to Visit  Medication Sig Dispense Refill   acetaminophen (TYLENOL) 500 MG tablet Take 500 mg by mouth as needed for mild pain.     AMBULATORY NON FORMULARY MEDICATION Folding walker with front wheels.  Use as needed. Disp 1 R26.89 1 each 0   AMBULATORY NON FORMULARY MEDICATION Folding walker with front wheels. Dispense 1 Dx code: R26.89 Use as needed 1 Device 0  Blood Glucose Calibration (ACCU-CHEK GUIDE CONTROL) LIQD 1 Act by In Vitro route 2 (two) times daily as needed. 1 each 3   Blood Glucose Monitoring Suppl (ACCU-CHEK GUIDE ME) w/Device KIT 1 Act by Does not apply route 2 (two) times daily as needed. 2 kit 2   diclofenac Sodium (VOLTAREN) 1 % GEL Apply 4 g topically 4 (four) times daily. 100 g 5   empagliflozin (JARDIANCE) 10 MG TABS tablet TAKE 1 TABLET (10 MG TOTAL) BY MOUTH DAILY BEFORE BREAKFAST. 90 tablet 0   FLUoxetine (PROZAC) 40 MG capsule Take 1 capsule (40 mg total) by mouth daily. 90 capsule 0   glucose blood (ACCU-CHEK GUIDE) test strip 1 each by Other route 2 (two) times daily. 300 each 1   levothyroxine (SYNTHROID) 50 MCG tablet Take 1 tablet (50 mcg total) by mouth daily. 100 tablet 0   losartan (COZAAR) 100 MG tablet Take 1 tablet (100 mg total) by mouth daily. 90 tablet 0   memantine (NAMENDA XR) 28 MG CP24 24 hr capsule Take 1 capsule (28 mg total) by mouth daily. 90 capsule 1   rosuvastatin (CRESTOR) 10 MG tablet  Take 1 tablet (10 mg total) by mouth daily. 100 tablet 0   sodium bicarbonate 650 MG tablet Take 1 tablet (650 mg total) by mouth 3 (three) times daily. 30 tablet 0   No current facility-administered medications on file prior to visit.        ROS:  All others reviewed and negative.  Objective        PE:  BP 124/76 (BP Location: Left Arm, Patient Position: Sitting, Cuff Size: Normal)   Pulse 70   Temp 98.3 F (36.8 C) (Oral)   Ht 4\' 11"  (1.499 m)   Wt 157 lb (71.2 kg)   SpO2 99%   BMI 31.71 kg/m                 Constitutional: Pt appears in NAD               HENT: Head: NCAT.                Right Ear: External ear normal.                 Left Ear: External ear normal. Bilat tm's with mild erythema.  Max sinus areas non tender.  Pharynx with mild erythema, no exudate               Eyes: . Pupils are equal, round, and reactive to light. Conjunctivae and EOM are normal               Nose: without d/c or deformity               Neck: Neck supple. Gross normal ROM               Cardiovascular: Normal rate and regular rhythm.                 Pulmonary/Chest: Effort normal and breath sounds with few LLL rales and trace wheezing.                Abd:  Soft, NT, ND, + BS, no organomegaly               Neurological: Pt is alert. At baseline orientation, motor grossly intact               Skin: Skin is warm. No rashes, no other new  lesions, LE edema - none               Psychiatric: Pt behavior is normal without agitation   Micro: none  Cardiac tracings I have personally interpreted today:  none  Pertinent Radiological findings (summarize): none   Lab Results  Component Value Date   WBC 9.5 03/12/2023   HGB 14.4 03/12/2023   HCT 43.7 03/12/2023   PLT 256.0 03/12/2023   GLUCOSE 99 03/12/2023   CHOL 272 (H) 11/29/2022   TRIG 97.0 11/29/2022   HDL 108.20 11/29/2022   LDLDIRECT 120.6 11/24/2012   LDLCALC 145 (H) 11/29/2022   ALT 24 03/12/2023   AST 17 03/12/2023   NA 138  03/12/2023   K 3.7 03/12/2023   CL 103 03/12/2023   CREATININE 0.86 03/12/2023   BUN 20 03/12/2023   CO2 25 03/12/2023   TSH 1.13 03/12/2023   INR 1.0 11/20/2021   HGBA1C 6.2 03/12/2023   MICROALBUR 1.7 02/15/2022   Assessment/Plan:  Analisse Gethers is a 82 y.o. Black or African American [2] female with  has a past medical history of Allergic rhinitis, Anxiety, Arthritis, Chronic cough, Colon polyp, Depression, Diabetes mellitus type II, Dyslipidemia, Hiatal hernia, Hypertension, Hypothyroidism, Lung nodule, Obesity (BMI 30-39.9), Pneumonia, and Sleep apnea.  Cough Mild to mod, for antibx course levaquin 500 every day, for cxr,,  to f/u any worsening symptoms or concerns  Wheezing Mild to mod, for medrol pack, cough med prn,,  to f/u any worsening symptoms or concerns  Fatigue Etiology o/w unclear, Exam otherwise benign, to check labs as documented, follow with expectant management   Hypothyroidism Lab Results  Component Value Date   TSH 1.13 03/12/2023   Stable, pt to continue levothyroxine 50 mcg qd   Type II diabetes mellitus with manifestations (HCC) Lab Results  Component Value Date   HGBA1C 6.2 03/12/2023   Stable, pt to continue current medical treatment jardiance 10 qd   Chronic hypoxemic respiratory failure (HCC) O/ stable, cont current home o2 2-3L  Followup: Return if symptoms worsen or fail to improve.  Oliver Barre, MD 03/16/2023 6:01 PM [facility] Internal Medicine

## 2023-03-12 NOTE — Patient Instructions (Signed)
Please take all new medication as prescribed - the antibiotic, and cough medicine as needed, and medrol steroid pack  Please continue all other medications as before, including inhaler.  Please have the pharmacy call with any other refills you may need.  Please keep your appointments with your specialists as you may have planned  Please go to the XRAY Department in the first floor for the x-ray testing  Please go to the LAB at the blood drawing area for the tests to be done  You will be contacted by phone if any changes need to be made immediately.  Otherwise, you will receive a letter about your results with an explanation, but please check with MyChart first.

## 2023-03-13 NOTE — Progress Notes (Signed)
The test results show that your current treatment is OK, as the tests are stable.  Please continue the same plan.  There is no other need for change of treatment or further evaluation based on these results, at this time.  thanks 

## 2023-03-15 NOTE — Progress Notes (Signed)
Reassuring study, no slow heart rate or junctional rhythm seen on this heart monitor. Occasional PVCs considered benign. Overall, normal heart monitor

## 2023-03-16 ENCOUNTER — Encounter: Payer: Self-pay | Admitting: Internal Medicine

## 2023-03-16 DIAGNOSIS — J9611 Chronic respiratory failure with hypoxia: Secondary | ICD-10-CM | POA: Insufficient documentation

## 2023-03-16 NOTE — Assessment & Plan Note (Signed)
Mild to mod, for medrol pack, cough med prn,,  to f/u any worsening symptoms or concerns

## 2023-03-16 NOTE — Assessment & Plan Note (Signed)
O/ stable, cont current home o2 2-3L

## 2023-03-16 NOTE — Assessment & Plan Note (Signed)
Lab Results  Component Value Date   TSH 1.13 03/12/2023   Stable, pt to continue levothyroxine 50 mcg qd

## 2023-03-16 NOTE — Assessment & Plan Note (Signed)
Lab Results  Component Value Date   HGBA1C 6.2 03/12/2023   Stable, pt to continue current medical treatment jardiance 10 qd

## 2023-03-16 NOTE — Assessment & Plan Note (Signed)
Etiology o/w unclear, Exam otherwise benign, to check labs as documented, follow with expectant management

## 2023-03-16 NOTE — Assessment & Plan Note (Signed)
Mild to mod, for antibx course levaquin 500 every day, for cxr,,  to f/u any worsening symptoms or concerns

## 2023-03-21 NOTE — Telephone Encounter (Signed)
 VOB initiated for 2025.

## 2023-03-29 ENCOUNTER — Ambulatory Visit
Admission: RE | Admit: 2023-03-29 | Discharge: 2023-03-29 | Disposition: A | Discharge status: Home / Self Care | Payer: Self-pay | Source: Ambulatory Visit | Attending: Internal Medicine | Admitting: Internal Medicine

## 2023-03-29 DIAGNOSIS — Z1231 Encounter for screening mammogram for malignant neoplasm of breast: Secondary | ICD-10-CM

## 2023-04-02 ENCOUNTER — Encounter: Payer: Self-pay | Admitting: Pulmonary Disease

## 2023-04-02 ENCOUNTER — Ambulatory Visit: Payer: Medicare HMO | Admitting: Pulmonary Disease

## 2023-04-03 ENCOUNTER — Other Ambulatory Visit: Payer: Self-pay | Admitting: Internal Medicine

## 2023-04-03 ENCOUNTER — Other Ambulatory Visit: Payer: Self-pay

## 2023-04-04 NOTE — Telephone Encounter (Signed)
Per [employer], [employer] policy has termed.   Update insurance

## 2023-04-09 ENCOUNTER — Ambulatory Visit (INDEPENDENT_AMBULATORY_CARE_PROVIDER_SITE_OTHER): Payer: PPO | Admitting: Internal Medicine

## 2023-04-09 ENCOUNTER — Other Ambulatory Visit: Payer: Self-pay

## 2023-04-09 ENCOUNTER — Other Ambulatory Visit (HOSPITAL_COMMUNITY): Payer: Self-pay

## 2023-04-09 ENCOUNTER — Encounter: Payer: Self-pay | Admitting: Internal Medicine

## 2023-04-09 ENCOUNTER — Telehealth: Payer: Self-pay | Admitting: Family Medicine

## 2023-04-09 ENCOUNTER — Ambulatory Visit: Payer: Medicare HMO | Admitting: Family Medicine

## 2023-04-09 DIAGNOSIS — E785 Hyperlipidemia, unspecified: Secondary | ICD-10-CM | POA: Diagnosis not present

## 2023-04-09 DIAGNOSIS — E118 Type 2 diabetes mellitus with unspecified complications: Secondary | ICD-10-CM | POA: Diagnosis not present

## 2023-04-09 DIAGNOSIS — I10 Essential (primary) hypertension: Secondary | ICD-10-CM

## 2023-04-09 DIAGNOSIS — E039 Hypothyroidism, unspecified: Secondary | ICD-10-CM | POA: Diagnosis not present

## 2023-04-09 DIAGNOSIS — I5032 Chronic diastolic (congestive) heart failure: Secondary | ICD-10-CM

## 2023-04-09 MED ORDER — LOSARTAN POTASSIUM 100 MG PO TABS
100.0000 mg | ORAL_TABLET | Freq: Every day | ORAL | 0 refills | Status: DC
  Filled 2023-04-09: qty 90, 90d supply, fill #0

## 2023-04-09 MED ORDER — NITROGLYCERIN 0.4 MG SL SUBL
0.4000 mg | SUBLINGUAL_TABLET | SUBLINGUAL | 0 refills | Status: DC | PRN
  Filled 2023-04-09: qty 50, 1d supply, fill #0

## 2023-04-09 MED ORDER — SPIRONOLACTONE 25 MG PO TABS
25.0000 mg | ORAL_TABLET | Freq: Every day | ORAL | 0 refills | Status: DC
  Filled 2023-04-09: qty 90, 90d supply, fill #0

## 2023-04-09 MED ORDER — CARVEDILOL 3.125 MG PO TABS
3.1250 mg | ORAL_TABLET | Freq: Two times a day (BID) | ORAL | 0 refills | Status: DC
  Filled 2023-04-09: qty 180, 90d supply, fill #0

## 2023-04-09 MED ORDER — LEVOTHYROXINE SODIUM 50 MCG PO TABS
50.0000 ug | ORAL_TABLET | Freq: Every day | ORAL | 0 refills | Status: DC
  Filled 2023-04-09: qty 100, 100d supply, fill #0

## 2023-04-09 MED ORDER — ROSUVASTATIN CALCIUM 10 MG PO TABS
10.0000 mg | ORAL_TABLET | Freq: Every day | ORAL | 0 refills | Status: DC
  Filled 2023-04-09: qty 100, 100d supply, fill #0

## 2023-04-09 NOTE — Telephone Encounter (Signed)
Insurance updated from [employer] to [employer] Adv HMO.

## 2023-04-09 NOTE — Telephone Encounter (Signed)
Brandy-new insurance ( HTA for 2025 ). Patient wants injections, can we run with new insurance? I am guessing for Zilretta and gel as patient is ready whenever we get approval.

## 2023-04-09 NOTE — Progress Notes (Deleted)
   Rubin Payor, PhD, LAT, ATC acting as a scribe for Clementeen Graham, MD.  [name] is a 83 y.o. female who presents to [facility] today for f/u bilateral knee pain.  Patient was last seen by Dr. Denyse Amass on 02/05/2023 and was given bilateral knee steroid injections.  She completed the [place] series, 3/3, bilaterally on 10/02/2022. Last Zilretta injections were on 06/25/22.  Today, patient reports***  Dx imaging: 08/08/20 R knee XR             01/16/20 L knee XR  Pertinent review of systems: ***  Relevant historical information: ***   Exam:  There were no vitals taken for this visit. General: Well Developed, well nourished, and in no acute distress.   MSK: ***    Lab and Radiology Results No results found for this or any previous visit (from the past 72 hours). No results found.     Assessment and Plan: 83 y.o. female with ***   PDMP not reviewed this encounter. No orders of the defined types were placed in this encounter.  No orders of the defined types were placed in this encounter.    Discussed warning signs or symptoms. Please see discharge instructions. Patient expresses understanding.   ***

## 2023-04-09 NOTE — Telephone Encounter (Signed)
GELSYN for BILAT knee OA  OK to schedule on or after 04/05/23  [employer]  Primary Insurance: [employer] Adv HMO Co-pay: n/a Co-insurance: 20% Deductible: does not apply Prior Auth: NOT required   Knee Injection History 05/11/20 - Triamcinolone LEFT 09/08/20 - Triamcinolone LEFT 12/22/20 - Triamcinolone LEFT 03/16/21 - Triamcinolone LEFT 06/15/21 - Triamcinolone BILAT 09/14/21 - Triamcinolone BILAT 12/25/21 - Triamcinolone BILAT 05/24/22 - Triamcinolone BILAT 06/25/22 - Zilretta BILAT 09/17/22 - Gelsyn #1 BILAT 09/25/22 - Gelsyn #2 BILAT 10/02/22 - Gelsyn #3 BILAT 02/05/23 - Triamcinolone BILAT

## 2023-04-09 NOTE — Patient Instructions (Signed)
Hypertension, Adult High blood pressure (hypertension) is when the force of blood pumping through the arteries is too strong. The arteries are the blood vessels that carry blood from the heart throughout the body. Hypertension forces the heart to work harder to pump blood and may cause arteries to become narrow or stiff. Untreated or uncontrolled hypertension can lead to a heart attack, heart failure, a stroke, kidney disease, and other problems. A blood pressure reading consists of a higher number over a lower number. Ideally, your blood pressure should be below 120/80. The first ("top") number is called the systolic pressure. It is a measure of the pressure in your arteries as your heart beats. The second ("bottom") number is called the diastolic pressure. It is a measure of the pressure in your arteries as the heart relaxes. What are the causes? The exact cause of this condition is not known. There are some conditions that result in high blood pressure. What increases the risk? Certain factors may make you more likely to develop high blood pressure. Some of these risk factors are under your control, including: Smoking. Not getting enough exercise or physical activity. Being overweight. Having too much fat, sugar, calories, or salt (sodium) in your diet. Drinking too much alcohol. Other risk factors include: Having a personal history of heart disease, diabetes, high cholesterol, or kidney disease. Stress. Having a family history of high blood pressure and high cholesterol. Having obstructive sleep apnea. Age. The risk increases with age. What are the signs or symptoms? High blood pressure may not cause symptoms. Very high blood pressure (hypertensive crisis) may cause: Headache. Fast or irregular heartbeats (palpitations). Shortness of breath. Nosebleed. Nausea and vomiting. Vision changes. Severe chest pain, dizziness, and seizures. How is this diagnosed? This condition is diagnosed by  measuring your blood pressure while you are seated, with your arm resting on a flat surface, your legs uncrossed, and your feet flat on the floor. The cuff of the blood pressure monitor will be placed directly against the skin of your upper arm at the level of your heart. Blood pressure should be measured at least twice using the same arm. Certain conditions can cause a difference in blood pressure between your right and left arms. If you have a high blood pressure reading during one visit or you have normal blood pressure with other risk factors, you may be asked to: Return on a different day to have your blood pressure checked again. Monitor your blood pressure at home for 1 week or longer. If you are diagnosed with hypertension, you may have other blood or imaging tests to help your health care provider understand your overall risk for other conditions. How is this treated? This condition is treated by making healthy lifestyle changes, such as eating healthy foods, exercising more, and reducing your alcohol intake. You may be referred for counseling on a healthy diet and physical activity. Your health care provider may prescribe medicine if lifestyle changes are not enough to get your blood pressure under control and if: Your systolic blood pressure is above 130. Your diastolic blood pressure is above 80. Your personal target blood pressure may vary depending on your medical conditions, your age, and other factors. Follow these instructions at home: Eating and drinking  Eat a diet that is high in fiber and potassium, and low in sodium, added sugar, and fat. An example of this eating plan is called the DASH diet. DASH stands for Dietary Approaches to Stop Hypertension. To eat this way: Eat   plenty of fresh fruits and vegetables. Try to fill one half of your plate at each meal with fruits and vegetables. Eat whole grains, such as whole-wheat pasta, brown rice, or whole-grain bread. Fill about one  fourth of your plate with whole grains. Eat or drink low-fat dairy products, such as skim milk or low-fat yogurt. Avoid fatty cuts of meat, processed or cured meats, and poultry with skin. Fill about one fourth of your plate with lean proteins, such as fish, chicken without skin, beans, eggs, or tofu. Avoid pre-made and processed foods. These tend to be higher in sodium, added sugar, and fat. Reduce your daily sodium intake. Many people with hypertension should eat less than 1,500 mg of sodium a day. Do not drink alcohol if: Your health care provider tells you not to drink. You are pregnant, may be pregnant, or are planning to become pregnant. If you drink alcohol: Limit how much you have to: 0-1 drink a day for women. 0-2 drinks a day for men. Know how much alcohol is in your drink. In the [place] one drink equals one 12 oz bottle of beer (355 mL), one 5 oz glass of wine (148 mL), or one 1 oz glass of hard liquor (44 mL). Lifestyle  Work with your health care provider to maintain a healthy body weight or to lose weight. Ask what an ideal weight is for you. Get at least 30 minutes of exercise that causes your heart to beat faster (aerobic exercise) most days of the week. Activities may include walking, swimming, or biking. Include exercise to strengthen your muscles (resistance exercise), such as Pilates or lifting weights, as part of your weekly exercise routine. Try to do these types of exercises for 30 minutes at least 3 days a week. Do not use any products that contain nicotine or tobacco. These products include cigarettes, chewing tobacco, and vaping devices, such as e-cigarettes. If you need help quitting, ask your health care provider. Monitor your blood pressure at home as told by your health care provider. Keep all follow-up visits. This is important. Medicines Take over-the-counter and prescription medicines only as told by your health care provider. Follow directions carefully. Blood  pressure medicines must be taken as prescribed. Do not skip doses of blood pressure medicine. Doing this puts you at risk for problems and can make the medicine less effective. Ask your health care provider about side effects or reactions to medicines that you should watch for. Contact a health care provider if you: Think you are having a reaction to a medicine you are taking. Have headaches that keep coming back (recurring). Feel dizzy. Have swelling in your ankles. Have trouble with your vision. Get help right away if you: Develop a severe headache or confusion. Have unusual weakness or numbness. Feel faint. Have severe pain in your chest or abdomen. Vomit repeatedly. Have trouble breathing. These symptoms may be an emergency. Get help right away. Call 911. Do not wait to see if the symptoms will go away. Do not drive yourself to the hospital. Summary Hypertension is when the force of blood pumping through your arteries is too strong. If this condition is not controlled, it may put you at risk for serious complications. Your personal target blood pressure may vary depending on your medical conditions, your age, and other factors. For most people, a normal blood pressure is less than 120/80. Hypertension is treated with lifestyle changes, medicines, or a combination of both. Lifestyle changes include losing weight, eating a healthy,   low-sodium diet, exercising more, and limiting alcohol. This information is not intended to replace advice given to you by your health care provider. Make sure you discuss any questions you have with your health care provider. Document Revised: 01/10/2021 Document Reviewed: 01/10/2021 Elsevier Patient Education  2024 [employer]vier Inc.  

## 2023-04-09 NOTE — Progress Notes (Signed)
Subjective:  Patient ID: [name], female    DOB: 11-30-1940  Age: 83 y.o. MRN: [id number]  CC: Hypothyroidism, Diabetes, and Hypertension   HPI [name] presents for f/up -----  Discussed the use of AI scribe software for clinical note transcription with the patient, who gave verbal consent to proceed.  History of Present Illness   The patient presents with concerns about oral health, specifically questioning if she had oral thrush. She describes a past episode of red stripes and painful bumps on her tongue, which have since resolved.  The patient also reports a recurring headache, localized to the left side, which is present upon waking and can be managed with Tylenol. She denies any sinus involvement.  The patient has a history of cardiac issues and has been prescribed nitroglycerin in the past. She reports two recent episodes of chest tightness, occurring within a month of each other, which were relieved by the use of nitroglycerin. The most recent episode was last week.  The patient was recently hospitalized, during which time she was taken off carvedilol, potassium, and a diuretic prescribed by a cardiologist. The patient reports that her heart rate was significantly low during this time, reaching 38 beats per minute.  The patient also reports balance issues and tremors, which have led to a decrease in her trust in her own physical stability. She has been using a walker or cane for support.  The patient has noticed an increase in her blood pressure over the last two weeks, with readings consistently around 156/94. She has been considering taking additional medication to manage this, but expresses fear and uncertainty.  The patient also reports weight gain and lightheadedness upon standing. She has been using a walker or cane for support due to these balance issues.       Outpatient Medications Prior to Visit  Medication Sig Dispense Refill   acetaminophen (TYLENOL)  500 MG tablet Take 500 mg by mouth as needed for mild pain.     albuterol (VENTOLIN HFA) 108 (90 Base) MCG/ACT inhaler TAKE 2 PUFFS BY MOUTH EVERY 6 HOURS AS NEEDED FOR WHEEZE OR SHORTNESS OF BREATH 8.5 each 0   AMBULATORY NON FORMULARY MEDICATION Folding walker with front wheels.  Use as needed. Disp 1 R26.89 1 each 0   AMBULATORY NON FORMULARY MEDICATION Folding walker with front wheels. Dispense 1 Dx code: R26.89 Use as needed 1 Device 0   diclofenac Sodium (VOLTAREN) 1 % GEL Apply 4 g topically 4 (four) times daily. 100 g 5   empagliflozin (JARDIANCE) 10 MG TABS tablet TAKE 1 TABLET (10 MG TOTAL) BY MOUTH DAILY BEFORE BREAKFAST. 90 tablet 0   FLUoxetine (PROZAC) 40 MG capsule Take 1 capsule (40 mg total) by mouth daily. 90 capsule 0   memantine (NAMENDA XR) 28 MG CP24 24 hr capsule Take 1 capsule (28 mg total) by mouth daily. 90 capsule 1   levothyroxine (SYNTHROID) 50 MCG tablet Take 1 tablet (50 mcg total) by mouth daily. 100 tablet 0   losartan (COZAAR) 100 MG tablet Take 1 tablet (100 mg total) by mouth daily. 90 tablet 0   rosuvastatin (CRESTOR) 10 MG tablet Take 1 tablet (10 mg total) by mouth daily. 100 tablet 0   sodium bicarbonate 650 MG tablet Take 1 tablet (650 mg total) by mouth 3 (three) times daily. 30 tablet 0   Blood Glucose Calibration (ACCU-CHEK GUIDE CONTROL) LIQD 1 Act by In Vitro route 2 (two) times daily as needed. 1 each 3  Blood Glucose Monitoring Suppl (ACCU-CHEK GUIDE ME) w/Device KIT 1 Act by Does not apply route 2 (two) times daily as needed. 2 kit 2   glucose blood (ACCU-CHEK GUIDE) test strip 1 each by Other route 2 (two) times daily. 300 each 1   levofloxacin (LEVAQUIN) 500 MG tablet Take 1 tablet (500 mg total) by mouth daily. 10 tablet 0   methylPREDNISolone (MEDROL DOSEPAK) 4 MG TBPK tablet Take 4 tab by mouth x 3 days, 2 tabs x 3 days, 1 tab x 3 days 21 tablet 0   spironolactone (ALDACTONE) 25 MG tablet Take 25 mg by mouth daily. (Patient not taking:  Reported on 04/09/2023)     No facility-administered medications prior to visit.    ROS Review of Systems  Constitutional:  Negative for appetite change, diaphoresis, fatigue and unexpected weight change.  HENT: Negative.    Eyes: Negative.   Respiratory:  Negative for cough, chest tightness, shortness of breath and wheezing.   Cardiovascular:  Negative for chest pain, palpitations and leg swelling.  Gastrointestinal: Negative.  Negative for abdominal pain, constipation, diarrhea, nausea and vomiting.  Endocrine: Negative for cold intolerance and heat intolerance.  Genitourinary: Negative.  Negative for difficulty urinating and dysuria.  Musculoskeletal: Negative.   Skin: Negative.   Neurological:  Positive for headaches.  Psychiatric/Behavioral:  Positive for behavioral problems, confusion, decreased concentration and hallucinations. Negative for agitation, dysphoric mood, sleep disturbance and suicidal ideas. The patient is nervous/anxious.     Objective:  BP (!) 156/94 (BP Location: Left Arm, Patient Position: Sitting, Cuff Size: Normal)   Pulse 85   Temp 97.9 F (36.6 C) (Oral)   Resp 16   Ht 4\' 11"  (1.499 m)   Wt 156 lb 6.4 oz (70.9 kg)   SpO2 95%   BMI 31.59 kg/m   BP Readings from Last 3 Encounters:  04/09/23 (!) 156/94  03/12/23 124/76  02/24/23 (!) 145/61    Wt Readings from Last 3 Encounters:  04/09/23 156 lb 6.4 oz (70.9 kg)  03/12/23 157 lb (71.2 kg)  02/24/23 156 lb (70.8 kg)    Physical Exam Vitals reviewed.  Constitutional:      General: She is not in acute distress.    Appearance: She is not ill-appearing, toxic-appearing or diaphoretic.  HENT:     Mouth/Throat:     Mouth: Mucous membranes are moist.  Eyes:     General: No scleral icterus.    Conjunctiva/sclera: Conjunctivae normal.  Cardiovascular:     Rate and Rhythm: Normal rate and regular rhythm.     Heart sounds: No murmur heard.    No friction rub. No gallop.  Pulmonary:     Effort:  Pulmonary effort is normal.     Breath sounds: No stridor. No wheezing, rhonchi or rales.  Abdominal:     General: Abdomen is flat.     Palpations: There is no mass.     Tenderness: There is no abdominal tenderness. There is no guarding.     Hernia: No hernia is present.  Musculoskeletal:        General: Normal range of motion.     Cervical back: Neck supple.     Right lower leg: No edema.     Left lower leg: No edema.  Lymphadenopathy:     Cervical: No cervical adenopathy.  Skin:    General: Skin is warm and dry.  Neurological:     General: No focal deficit present.     Mental Status: She is  alert. Mental status is at baseline.  Psychiatric:        Mood and Affect: Mood normal.        Behavior: Behavior normal.     Lab Results  Component Value Date   WBC 9.5 03/12/2023   HGB 14.4 03/12/2023   HCT 43.7 03/12/2023   PLT 256.0 03/12/2023   GLUCOSE 99 03/12/2023   CHOL 272 (H) 11/29/2022   TRIG 97.0 11/29/2022   HDL 108.20 11/29/2022   LDLDIRECT 120.6 11/24/2012   LDLCALC 145 (H) 11/29/2022   ALT 24 03/12/2023   AST 17 03/12/2023   NA 138 03/12/2023   K 3.7 03/12/2023   CL 103 03/12/2023   CREATININE 0.86 03/12/2023   BUN 20 03/12/2023   CO2 25 03/12/2023   TSH 1.13 03/12/2023   INR 1.0 11/20/2021   HGBA1C 6.2 03/12/2023   MICROALBUR 1.7 02/15/2022    MM 3D SCREENING MAMMOGRAM BILATERAL BREAST Result Date: 04/01/2023 CLINICAL DATA:  Screening. EXAM: DIGITAL SCREENING BILATERAL MAMMOGRAM WITH TOMOSYNTHESIS AND CAD TECHNIQUE: Bilateral screening digital craniocaudal and mediolateral oblique mammograms were obtained. Bilateral screening digital breast tomosynthesis was performed. The images were evaluated with computer-aided detection. COMPARISON:  Previous exam(s). ACR Breast Density Category c: The breasts are heterogeneously dense, which may obscure small masses. FINDINGS: There are no findings suspicious for malignancy. IMPRESSION: No mammographic evidence of  malignancy. A result letter of this screening mammogram will be mailed directly to the patient. RECOMMENDATION: Screening mammogram in one year. (Code:SM-B-01Y) BI-RADS CATEGORY  1: Negative. Electronically Signed   By: Sherian Rein M.D.   On: 04/01/2023 14:50    Assessment & Plan:   Acquired hypothyroidism- She is euthyroid. -     Levothyroxine Sodium; Take 1 tablet (50 mcg total) by mouth daily.  Dispense: 100 tablet; Refill: 0  Hyperlipidemia with target LDL less than 100- LDL goal achieved. Doing well on the statin  -     Rosuvastatin Calcium; Take 1 tablet (10 mg total) by mouth daily.  Dispense: 100 tablet; Refill: 0  Chronic heart failure with preserved ejection fraction (HCC)- No signs of fluid overload. -     Losartan Potassium; Take 1 tablet (100 mg total) by mouth daily.  Dispense: 90 tablet; Refill: 0 -     Carvedilol; Take 1 tablet (3.125 mg total) by mouth 2 (two) times daily with a meal.  Dispense: 180 tablet; Refill: 0  Essential hypertension -     Losartan Potassium; Take 1 tablet (100 mg total) by mouth daily.  Dispense: 90 tablet; Refill: 0 -     Spironolactone; Take 1 tablet (25 mg total) by mouth daily.  Dispense: 90 tablet; Refill: 0 -     Carvedilol; Take 1 tablet (3.125 mg total) by mouth 2 (two) times daily with a meal.  Dispense: 180 tablet; Refill: 0  Type II diabetes mellitus with manifestations (HCC) -     Losartan Potassium; Take 1 tablet (100 mg total) by mouth daily.  Dispense: 90 tablet; Refill: 0     Follow-up: Return in about 3 months (around 07/08/2023).  Sanda Linger, MD

## 2023-04-10 ENCOUNTER — Other Ambulatory Visit: Payer: Self-pay | Admitting: Internal Medicine

## 2023-04-10 ENCOUNTER — Other Ambulatory Visit (HOSPITAL_COMMUNITY): Payer: Self-pay

## 2023-04-10 DIAGNOSIS — I5032 Chronic diastolic (congestive) heart failure: Secondary | ICD-10-CM

## 2023-04-10 MED ORDER — NITROGLYCERIN 0.4 MG SL SUBL
0.4000 mg | SUBLINGUAL_TABLET | SUBLINGUAL | 0 refills | Status: DC | PRN
  Filled 2023-10-22: qty 50, 1d supply, fill #0

## 2023-04-10 NOTE — Telephone Encounter (Signed)
VOB initiated for Zilretta for BILAT knee OA 

## 2023-04-12 NOTE — Telephone Encounter (Signed)
ZILRETTA for BILAT knee OA   Medical Buy and Bill  Primary Insurance: [employer] H[employer] C-SNP Co-pay: $15 Co-insurance: 20% Deductible: does not apply Prior Auth: NOT required  Pharmacy Benefit  RX Advance  Co-pay: $100 Deductible: does not apply Prior Auth: NOT required   Knee Injection History 06/15/21 - Kenalog BILAT 09/14/21 - Kenalog BILAT 12/25/21 - Kenalog BILAT 05/24/22 - Kenalog BILAT 06/25/22 - Zilretta BILAT 09/17/22 - Gelsyn #1 BILAT 09/25/22 - Gelsyn #2 BILAT 10/02/22 - GELSYN #3 BILAT 02/05/23 - Kenalog BILAT

## 2023-04-12 NOTE — Telephone Encounter (Signed)
Medical Buy and Annette Stable - Prior Authorization NOT required  Pharmacy Benefit - RX Advance - Prior Authorization NOT required

## 2023-04-16 NOTE — Telephone Encounter (Signed)
Holding for stock to arrive.

## 2023-04-19 ENCOUNTER — Other Ambulatory Visit (HOSPITAL_COMMUNITY): Payer: Self-pay

## 2023-04-19 ENCOUNTER — Other Ambulatory Visit: Payer: Self-pay

## 2023-04-19 ENCOUNTER — Telehealth: Payer: Self-pay | Admitting: Internal Medicine

## 2023-04-19 DIAGNOSIS — I5032 Chronic diastolic (congestive) heart failure: Secondary | ICD-10-CM

## 2023-04-19 DIAGNOSIS — G301 Alzheimer's disease with late onset: Secondary | ICD-10-CM

## 2023-04-19 MED ORDER — MEMANTINE HCL ER 28 MG PO CP24
28.0000 mg | ORAL_CAPSULE | Freq: Every day | ORAL | 1 refills | Status: DC
  Filled 2023-04-19 – 2023-04-23 (×2): qty 90, 90d supply, fill #0
  Filled 2023-05-02: qty 30, 30d supply, fill #0
  Filled 2023-05-10: qty 90, 90d supply, fill #1
  Filled 2023-05-30: qty 100, 100d supply, fill #1

## 2023-04-19 MED ORDER — EMPAGLIFLOZIN 10 MG PO TABS
10.0000 mg | ORAL_TABLET | Freq: Every day | ORAL | 1 refills | Status: DC
  Filled 2023-04-19: qty 90, 90d supply, fill #0

## 2023-04-19 NOTE — Telephone Encounter (Unsigned)
Copied from CRM [id number]. Topic: Clinical - Medication Question >> Apr 19, 2023 12:39 PM Presley Raddle C wrote: Reason for CRM: pt called and asked for Dr. Barnett Applebaum nurse to call her to discuss some of her medications. Please call and advise.

## 2023-04-19 NOTE — Telephone Encounter (Signed)
 Medication has been refilled.

## 2023-04-20 ENCOUNTER — Other Ambulatory Visit: Payer: Self-pay

## 2023-04-23 ENCOUNTER — Other Ambulatory Visit: Payer: Self-pay

## 2023-04-24 ENCOUNTER — Other Ambulatory Visit: Payer: Self-pay

## 2023-04-25 ENCOUNTER — Other Ambulatory Visit: Payer: Self-pay

## 2023-04-25 NOTE — Telephone Encounter (Signed)
 Scheduled

## 2023-04-30 ENCOUNTER — Encounter (HOSPITAL_BASED_OUTPATIENT_CLINIC_OR_DEPARTMENT_OTHER): Payer: Self-pay | Admitting: Family

## 2023-04-30 ENCOUNTER — Other Ambulatory Visit: Payer: Self-pay | Admitting: Internal Medicine

## 2023-04-30 ENCOUNTER — Ambulatory Visit (INDEPENDENT_AMBULATORY_CARE_PROVIDER_SITE_OTHER): Payer: PPO | Admitting: Family

## 2023-04-30 ENCOUNTER — Encounter (HOSPITAL_BASED_OUTPATIENT_CLINIC_OR_DEPARTMENT_OTHER): Payer: Self-pay | Admitting: *Deleted

## 2023-04-30 VITALS — BP 116/80 | HR 74 | Ht 59.0 in | Wt 153.0 lb

## 2023-04-30 DIAGNOSIS — I25118 Atherosclerotic heart disease of native coronary artery with other forms of angina pectoris: Secondary | ICD-10-CM

## 2023-04-30 DIAGNOSIS — F02A0 Dementia in other diseases classified elsewhere, mild, without behavioral disturbance, psychotic disturbance, mood disturbance, and anxiety: Secondary | ICD-10-CM

## 2023-04-30 DIAGNOSIS — I5032 Chronic diastolic (congestive) heart failure: Secondary | ICD-10-CM

## 2023-04-30 DIAGNOSIS — I10 Essential (primary) hypertension: Secondary | ICD-10-CM | POA: Diagnosis not present

## 2023-04-30 DIAGNOSIS — E785 Hyperlipidemia, unspecified: Secondary | ICD-10-CM

## 2023-04-30 NOTE — Patient Instructions (Addendum)
Medication Instructions:  Continue your current medications.  *If you need a refill on your cardiac medications before your next appointment, please call your pharmacy*  Follow-Up: At [facility], you and your health needs are our priority.  As part of our continuing mission to provide you with exceptional heart care, we have created designated Provider Care Teams.  These Care Teams include your primary Cardiologist (physician) and Advanced Practice Providers (APPs -  Physician Assistants and Nurse Practitioners) who all work together to provide you with the care you need, when you need it.  We recommend signing up for the patient portal called "MyChart".  Sign up information is provided on this After Visit Summary.  MyChart is used to connect with patients for Virtual Visits (Telemedicine).  Patients are able to view lab/test results, encounter notes, upcoming appointments, etc.  Non-urgent messages can be sent to your provider as well.   To learn more about what you can do with MyChart, go to [email or web address].    Your next appointment:   4 months with Dr. Duke Salvia   Other Instructions  Recommend calling pulmonology to schedule an appointment. If you wish to see Dr. Everardo All or Dr. Vassie Loll at the [facility]. Their phone number is  [phone]  Heart Healthy Diet Recommendations: A low-salt diet is recommended. Meats should be grilled, baked, or boiled. Avoid fried foods. Focus on lean protein sources like fish or chicken with vegetables and fruits. The American Heart Association is a Chief Technology Officer!  American Heart Association Diet and Lifeystyle Recommendations   Exercise recommendations: The American Heart Association recommends 150 minutes of moderate intensity exercise weekly. Try 30 minutes of moderate intensity exercise 4-5 times per week. This could include walking, jogging, or swimming.

## 2023-04-30 NOTE — Progress Notes (Signed)
Cardiology Office Note:  .   Date:  04/30/2023  ID:  [name], DOB 1940-06-17, MRN [id number] PCP: [name], MD  [facility] Providers Cardiologist:  Chilton Si, MD    History of Present Illness: .   [name] is a 83 y.o. female with a hx of HTN, HLD, Dm2, hypothyroidism, OSA not on CPAP, junctional bradycardia, HFpEF.   Initially seen by Dr. Ninetta Lights in 07/2011 for dyspnea at rest with Myoview, PFT unremarkable. RHC 09/2011 normal R heart pressure. Echo 08/2020 LVEF 60-65%, mild LVH, gr1dd, aortic sclerosis without stenosis, trivial MR.   Seen by Dr. Duke Salvia 07/2022 with intermittent dyspnea on exertion.  Spironolactone added for blood pressure control.  Coronary CTA with calcium score 19.1 placing her in the 50th percentile for age/sex.  Echocardiogram normal LVEF 55 to 60%, no RWMA, grade 1 diastolic dysfunction, aortic sclerosis without stenosis.  Admitted 12/2022 for junctional bradycardia.  The day prior to admission for cardiac CT she took metoprolol tart to 100 mg, held her carvedilol, took her regular diltiazem.  She went to the ED for her and in the 30s.  Atropine and IV glucagon were administered.  Carvedilol diltiazem held on discharge.  Losartan spironolactone resumed.  She wore live monitor for 4 days with heart rate 64-136 bpm.  Lasting by Rise Paganini, NP 01/22/2023 doing well from cardiac perspective with improvement in exertional dyspnea and no recurrent bradycardia.  She was on 1 L of O2 since hospital discharge and encouraged to follow-up with pulmonology.  She presents today for follow-up. Pleasant lady who resides at [facility]. She notes since November she has had COVID, RSV, and PNA. Feels her breathing is back to baseline and only dyspnea with more than usual activity. She is waling in her building for exercise with her 2L O2 if she needs.  Monitors BP at home periodically with reassuring readings.   ROS: Please see the history of  present illness.    All other systems reviewed and are negative.   Studies Reviewed: .        Cardiac Studies & Procedures   ______________________________________________________________________________________________     ECHOCARDIOGRAM  ECHOCARDIOGRAM COMPLETE 01/01/2023  Narrative ECHOCARDIOGRAM REPORT    Patient Name:   [name] Date of Exam: 01/01/2023 Medical Rec #:  [id number]      Height:       59.0 in Accession #:    [id number]     Weight:       157.0 lb Date of Birth:  04-09-1940      BSA:          1.664 m Patient Age:    82 years       BP:           126/84 mmHg Patient Gender: F              HR:           67 bpm. Exam Location:  Outpatient  Procedure: 2D Echo, 3D Echo, Color Doppler, Cardiac Doppler and Strain Analysis  Indications:    Dyspnea  History:        Patient has prior history of Echocardiogram examinations, most recent 08/26/2020. Risk Factors:Hypertension, Dyslipidemia, Former Smoker and Diabetes. Mild late onset Alzheimers dementia.  Sonographer:    Jeryl Columbia RDCS Referring Phys: [id number] TIFFANY Galva  IMPRESSIONS   1. Left ventricular ejection fraction, by estimation, is 55 to 60%. The left ventricle has normal function. The left ventricle has no regional  wall motion abnormalities. Left ventricular diastolic parameters are consistent with Grade I diastolic dysfunction (impaired relaxation). 2. Right ventricular systolic function is normal. The right ventricular size is normal. Tricuspid regurgitation signal is inadequate for assessing PA pressure. 3. The mitral valve is grossly normal. Trivial mitral valve regurgitation. 4. The aortic valve is tricuspid. Aortic valve regurgitation is not visualized. Aortic valve sclerosis/calcification is present, without any evidence of aortic stenosis. 5. The inferior vena cava is normal in size with greater than 50% respiratory variability, suggesting right atrial pressure of 3  mmHg.  Comparison(s): Changes from prior study are noted. 08/26/2020: LVEF 60-65%, grade 1 DD, aortic valve sclerosis.  FINDINGS Left Ventricle: Left ventricular ejection fraction, by estimation, is 55 to 60%. The left ventricle has normal function. The left ventricle has no regional wall motion abnormalities. The left ventricular internal cavity size was normal in size. There is no left ventricular hypertrophy. Left ventricular diastolic parameters are consistent with Grade I diastolic dysfunction (impaired relaxation).  Right Ventricle: The right ventricular size is normal. No increase in right ventricular wall thickness. Right ventricular systolic function is normal. Tricuspid regurgitation signal is inadequate for assessing PA pressure.  Left Atrium: Left atrial size was normal in size.  Right Atrium: Right atrial size was normal in size.  Pericardium: There is no evidence of pericardial effusion.  Mitral Valve: The mitral valve is grossly normal. Trivial mitral valve regurgitation.  Tricuspid Valve: The tricuspid valve is normal in structure. Tricuspid valve regurgitation is not demonstrated.  Aortic Valve: The aortic valve is tricuspid. Aortic valve regurgitation is not visualized. Aortic valve sclerosis/calcification is present, without any evidence of aortic stenosis.  Pulmonic Valve: The pulmonic valve was normal in structure. Pulmonic valve regurgitation is not visualized.  Aorta: The aortic root and ascending aorta are structurally normal, with no evidence of dilitation.  Venous: The inferior vena cava is normal in size with greater than 50% respiratory variability, suggesting right atrial pressure of 3 mmHg.  IAS/Shunts: No atrial level shunt detected by color flow Doppler.   LEFT VENTRICLE PLAX 2D LVIDd:         3.30 cm LVIDs:         2.14 cm LV PW:         0.82 cm LV IVS:        0.88 cm LVOT diam:     1.80 cm   3D Volume EF: LVOT Area:     2.54 cm  3D EF:        54  % LV EDV:       75 ml LV ESV:       35 ml LV SV:        41 ml  RIGHT VENTRICLE RV Basal diam:  3.60 cm RV Mid diam:    2.83 cm RV S prime:     9.90 cm/s TAPSE (M-mode): 2.6 cm  LEFT ATRIUM           Index        RIGHT ATRIUM           Index LA diam:      3.20 cm 1.92 cm/m   RA Area:     12.50 cm LA Vol (A2C): 35.0 ml 21.03 ml/m  RA Volume:   29.60 ml  17.79 ml/m  AORTA Ao Root diam: 2.70 cm  MITRAL VALVE MV Area (PHT): 4.06 cm       SHUNTS MV Decel Time: 187 msec  Systemic Diam: 1.80 cm MR Peak grad:    124.1 mmHg MR Mean grad:    94.0 mmHg MR Vmax:         557.00 cm/s MR Vmean:        463.0 cm/s MR PISA:         5.09 cm MR PISA Eff ROA: 35 mm MR PISA Radius:  0.90 cm MV E velocity: 89.60 cm/s MV A velocity: 95.90 cm/s MV E/A ratio:  0.93  Zoila Shutter MD Electronically signed by Zoila Shutter MD Signature Date/Time: 01/01/2023/3:07:38 PM    Final    MONITORS  LONG TERM MONITOR-LIVE TELEMETRY (3-14 DAYS) 01/10/2023  Narrative 4 Day Zio Monitor  Quality: Fair.  Baseline artifact. Predominant rhythm: sinus rhythm Average heart rate: 81 bpm Max heart rate: 115 bpm sinus rhythm Min heart rate: 64 bpm Pauses >2.5 seconds: none  Rare PACs Occasional (1.2%) PVCs Ventricular bigeminy and trigeminy Up to 15 beats SVT   Tiffany C. Duke Salvia, MD, [facility] 03/13/2023 8:02 PM   CT SCANS  CT CORONARY MORPH W/CTA COR W/SCORE 12/20/2022  Addendum 01/12/2023  5:24 PM ADDENDUM REPORT: 01/12/2023 17:21  EXAM: OVER-READ INTERPRETATION  CT CHEST  The following report is an over-read performed by radiologist Dr. Lesia Hausen [facility] on 01/12/2023. This over-read does not include interpretation of cardiac or coronary anatomy or pathology. The coronary CTA interpretation by the cardiologist is attached.  COMPARISON:  11/20/2021 chest radiograph.  09/06/2011 chest CT.  FINDINGS: Cardiovascular: Top-normal heart size. No significant  pericardial effusion/thickening. Atherosclerotic nonaneurysmal thoracic aorta. Normal caliber pulmonary arteries. No central pulmonary emboli.  Mediastinum/Nodes: Mildly patulous thoracic esophagus with mild layering fluid and debris. No pathologically enlarged mediastinal or hilar lymph nodes.  Lungs/Pleura: No pneumothorax. No pleural effusion. No acute consolidative airspace disease, lung masses or significant pulmonary nodules.  Upper abdomen: Mildly hypervascular 1.1 cm focus in the small spleen (series 14/image 43), potentially stable as seen on series 2/image 14 on 06/17/2013 CT abdomen study, although difficult to compare given different phases of contrast. Chronic hyperdense foci along the margin of the esophagogastric junction presumably from prior hiatal hernia repair.  Musculoskeletal: No aggressive appearing focal osseous lesions. Marked thoracic spondylosis.  IMPRESSION: 1. Mildly patulous thoracic esophagus with mild layering fluid and debris, suggesting chronic esophageal dysmotility. 2. Mildly hypervascular 1.1 cm focus in the small spleen, potentially stable as seen on 06/17/2013 CT abdomen study, although difficult to compare given different phases of contrast. This is most likely a benign lesion such as a hemangioma. No follow-up imaging recommended. 3.  Aortic Atherosclerosis (ICD10-I70.0).   Electronically Signed By: Delbert Phenix M.D. On: 01/12/2023 17:21  Narrative HISTORY: Dyspnea on exertion (DOE)  EXAM: Cardiac/Coronary  CT  TECHNIQUE: The patient was scanned on a [employer].  PROTOCOL: A non-contrast, gated CT scan was obtained with axial slices of 3 mm through the heart for calcium scoring. Calcium scoring was performed using the Agatston method. A 120 kV prospective, gated, contrast cardiac scan was obtained. Gantry rotation speed was 250 msecs and collimation was 0.6 mm. Two sublingual nitroglycerin tablets (0.8 mg) were  given. The 3D data set was reconstructed in 5% intervals of the 35-75% of the R-R cycle. Diastolic phases were analyzed on a dedicated workstation using MPR, MIP, and VRT modes. The patient received 95 cc of contrast.  FINDINGS: Image quality: Poor.  Artifact: Moderate (slab and breathing artifacts).  Coronary artery calcification score:  Left main: 0  Left anterior descending artery:  53.7  Left circumflex artery: 16.5  Right coronary artery: 21.9  Total coronary calcium score of 92.1, places the patient at the 58th percentile for age and sex matched control.  Coronary arteries: Normal coronary origins.  Right dominance.  Left Main Coronary Artery: Normal caliber vessel, originates from the left coronary cusp, bifurcates to form a left anterior descending artery and a left circumflex artery. There is no plaque or stenosis.  Left Anterior Descending Coronary Artery: Normal caliber vessel, reaches the apex, gives off 2 diagonal branches. The ostial LAD contains minimal calcified plaque (0-24%) and minimal luminal irregularities within proximal to mid segment. Mid to apical segments are patent overall but accuracy limited due to artifact.  First Diagonal branch: Patent.  Second Diagonal branch: Patent.  Left Circumflex Artery: Normal caliber vessel, non-dominant, travels within the atrioventricular groove and tapers off. It gives off 1 patent obtuse marginal branch. The proximal LCX contains mild mixed plaque (25-49%) and mid to distal vessel tapers off.  First Obtuse Marginal branch: Normal caliber, large size, overall patent.  Right Coronary Artery: Dominant vessel, originates from the right coronary cusp, terminates as a PDA and right posterolateral branch. RCA is grossly patent with minimal luminal irregularities due to soft and calcified plaque, but overall accuracy is limited due to artifacts. PDA is not well visualized.  Left Atrium: Grossly normal in size  with no left atrial appendage filling defect.  Left Ventricle: Grossly normal in size. There are no stigmata of prior infarction. There is no abnormal filling defect.  Pulmonary arteries: No proximal filling defect.  Pulmonary veins: Normal pulmonary venous drainage (three veins on the right and two veins on the left).  Aorta: Normal size, 27.5 mm at the mid ascending aorta (level of the PA bifurcation) measured double oblique. Aortic atherosclerosis. No dissection.  Pericardium: Normal thickness with no significant effusion or calcium present.  Cardiac valves: The aortic valve is trileaflet without significant calcification. The mitral valve is normal structure without significant calcification.  Extra-cardiac findings: See attached radiology report for non-cardiac structures.  IMPRESSION: 1. Coronary calcium score of 92.1. This was 58th percentile for age-, sex, and race-matched controls.  2. Normal coronary origin with right dominance.  3. Aortic atherosclerosis.  4. CAD-RADS = 2 Mild non-obstructive CAD.  5. Presence of both slab and breathing artifacts may effect the overall accuracy of the study. The study did not pass the quality control protocol for it to be sent to Heart [facility] for plaque analysis, roadmap, or CT-FFR.  RECOMMENDATIONS:  Consider non-atherosclerotic causes of chest pain. Consider preventive therapy and risk factor modification.  Electronically Signed: By: Tessa Lerner D.O. On: 12/20/2022 14:40     ______________________________________________________________________________________________      Risk Assessment/Calculations:             Physical Exam:   VS:  BP 116/80 (BP Location: Left Arm, Patient Position: Sitting, Cuff Size: Large)   Pulse 74   Ht 4\' 11"  (1.499 m)   Wt 153 lb (69.4 kg)   SpO2 97%   BMI 30.90 kg/m    Wt Readings from Last 3 Encounters:  04/30/23 153 lb (69.4 kg)  04/09/23 156 lb 6.4 oz (70.9 kg)  03/12/23  157 lb (71.2 kg)    GEN: Well nourished, well developed in no acute distress NECK: No JVD; No carotid bruits CARDIAC: RRR, no murmurs, rubs, gallops RESPIRATORY:  Clear to auscultation without rales, wheezing or rhonchi  ABDOMEN: Soft, non-tender, non-distended EXTREMITIES:  No edema; No deformity  ASSESSMENT AND PLAN: .    Junctional bradycardia- No recurrent bradycardia since discontinuation of AV nodal blocking agents (Carvedilol, Diltiazem).   Mild nonobstructive coronary artery disease/HLD, LDL goal less than 70- Stable with no anginal symptoms. No indication for ischemic evaluation.  GDMT Coreg, Jardiance, Rosuvastatin.   Chronic diastolic heart failure- Euvolemic and well compensated on exam. GDMT includes Coreg 3.125mg  BID, Jardiance 10mg  daily, Losartan 100mg  daily, Spironolactone 25mg  daily. No indication for loop diuretic.   Hypertension- BP well controlled. Continue current antihypertensive regimen Coreg 3.125mg  BID, Losartan 100mg  daily, Spironolactone 25mg  daily. .Discussed to monitor BP at home at least 2 hours after medications and sitting for 5-10 minutes.   DOE - Stable compared to previous. On home O2. Reports recent COVID, RSV, PNA. Some congestion on expiration but no increased dyspnea nor fever. Encouraged to to schedule overdue follow up with pulmonology.        Dispo: follow up in 4 months  Signed, Alver Sorrow, NP

## 2023-04-30 NOTE — Telephone Encounter (Unsigned)
Copied from CRM [id number]. Topic: Clinical - Medication Refill >> Apr 30, 2023 11:29 AM Truddie Crumble wrote: Most Recent Primary Care Visit:  Provider: [name]  Department: [facility]  Visit Type: OFFICE VISIT  Date: 04/09/2023  Medication: memantine (90 day supply but she need [facility] pills [facility] [address] [place])  Has the patient contacted their pharmacy? No (Agent: If no, request that the patient contact the pharmacy for the refill. If patient does not wish to contact the pharmacy document the reason why and proceed with request.) (Agent: If yes, when and what did the pharmacy advise?)  Is this the correct pharmacy for this prescription? Yes If no, delete pharmacy and type the correct one.  This is the patient's preferred pharmacy:  [facility] [address] [place] [address] Phone: [phone] Fax: [phone]   Has the prescription been filled recently? No  Is the patient out of the medication? Yes  Has the patient been seen for an appointment in the last year OR does the patient have an upcoming appointment? Yes  Can we respond through MyChart? Yes  Agent: Please be advised that Rx refills may take up to 3 business days. We ask that you follow-up with your pharmacy.

## 2023-04-30 NOTE — Telephone Encounter (Signed)
Copied from CRM [id number]. Topic: Clinical - Medication Refill >> Apr 30, 2023 11:29 AM Truddie Crumble wrote: Most Recent Primary Care Visit:  Provider: [name]  Department: [facility]  Visit Type: OFFICE VISIT  Date: 04/09/2023  Medication: memantine (90 day supply but she need [facility] pills [facility] [address] [place])  Has the patient contacted their pharmacy? No (Agent: If no, request that the patient contact the pharmacy for the refill. If patient does not wish to contact the pharmacy document the reason why and proceed with request.) (Agent: If yes, when and what did the pharmacy advise?)  Is this the correct pharmacy for this prescription? Yes If no, delete pharmacy and type the correct one.  This is the patient's preferred pharmacy:  [facility] [address] [place] [address] Phone: [phone] Fax: [phone]   Has the prescription been filled recently? No  Is the patient out of the medication? Yes  Has the patient been seen for an appointment in the last year OR does the patient have an upcoming appointment? Yes  Can we respond through MyChart? Yes  Agent: Please be advised that Rx refills may take up to 3 business days. We ask that you follow-up with your pharmacy.

## 2023-04-30 NOTE — Telephone Encounter (Signed)
This encounter was created in error - please disregard.

## 2023-05-02 ENCOUNTER — Telehealth: Payer: Self-pay

## 2023-05-02 ENCOUNTER — Other Ambulatory Visit (HOSPITAL_COMMUNITY): Payer: Self-pay

## 2023-05-02 ENCOUNTER — Other Ambulatory Visit: Payer: Self-pay

## 2023-05-02 NOTE — Telephone Encounter (Signed)
Patient states that she is out of her Memantine medication. I called the pharmacy to see why it was never shipped out and they said because she doesn't have a card on file. Called to speak with the patient and she states that they should have a card on file. She is also requesting a 10 day supply [facility] [address] I advised her that we can do that but we have to still get a card on file for [place] because she will have to pay for this medication. She requested that I call her back at 3:15,I will.

## 2023-05-03 ENCOUNTER — Other Ambulatory Visit: Payer: Self-pay

## 2023-05-06 ENCOUNTER — Telehealth: Payer: Self-pay | Admitting: Internal Medicine

## 2023-05-06 NOTE — Telephone Encounter (Signed)
Patient is calling in to speak with dr Yetta Barre she stated she did not want to talk about the issue right now she is wanting a call back from the dr

## 2023-05-07 ENCOUNTER — Other Ambulatory Visit (HOSPITAL_COMMUNITY): Payer: Self-pay

## 2023-05-07 ENCOUNTER — Ambulatory Visit: Payer: PRIVATE HEALTH INSURANCE | Admitting: Family Medicine

## 2023-05-07 NOTE — Progress Notes (Signed)
 Gina Payor, PhD, LAT, ATC acting as a scribe for Clementeen Graham, MD.  [name] is a 83 y.o. female who presents to [facility] today for f/u bilateral knee pain.  Patient was last seen by Dr. Denyse Amass on 02/05/2023 and was given bilateral knee steroid injections.  She completed the [place] series, 3/3, bilaterally on 10/02/2022. Last Zilretta injections were on 06/25/22.  Today, patient reports bilat knee pain returned recently. She also notes LBP and overall body pain. She is wanting to repeat Ziletta injections today.  . She notes her memory is not what it once was and she is having struggles with concentration and memory.  She does have a history of Alzheimer and has had a neuropsychological evaluation in [place] several years ago.  She wonders if her medications should be adjusted or she should have a new neuropsychological evaluation.  Additionally she notes that she is gained some weight and is having trouble losing it.  She is currently on Jardiance for diabetes and is not been on GLP-1's.  Dx imaging: 08/08/20 R knee XR             01/16/20 L knee XR   Pertinent review of systems: No fevers or chills  Relevant historical information: Diabetes and Alzheimer's.   Exam:  BP (!) 152/98   Pulse 70   Ht 4\' 11"  (1.499 m)   Wt 160 lb (72.6 kg)   SpO2 97%   BMI 32.32 kg/m  General: Well Developed, well nourished, and in no acute distress.   MSK: Knees bilaterally minimal effusion normal motion with crepitation.    Lab and Radiology Results   Zilretta injection bilateral knee Procedure: Real-time Ultrasound Guided Injection of right knee joint superior lateral patellar space Device: Philips Affiniti 50G Images permanently stored and available for review in PACS Verbal informed consent obtained.  Discussed risks and benefits of procedure. Warned about infection, hyperglycemia bleeding, damage to structures among others. Patient expresses  understanding and agreement Time-out conducted.   Noted no overlying erythema, induration, or other signs of local infection.   Skin prepped in a sterile fashion.   Local anesthesia: Topical Ethyl chloride.   With sterile technique and under real time ultrasound guidance: Zilretta 32 mg injected into knee joint. Fluid seen entering the joint capsule.   Completed without difficulty   Advised to call if fevers/chills, erythema, induration, drainage, or persistent bleeding.   Images permanently stored and available for review in the ultrasound unit.  Impression: Technically successful ultrasound guided injection.   Procedure: Real-time Ultrasound Guided Injection of left knee joint superior lateral patellar space Device: Philips Affiniti 50G Images permanently stored and available for review in PACS Verbal informed consent obtained.  Discussed risks and benefits of procedure. Warned about infection, hyperglycemia bleeding, damage to structures among others. Patient expresses understanding and agreement Time-out conducted.   Noted no overlying erythema, induration, or other signs of local infection.   Skin prepped in a sterile fashion.   Local anesthesia: Topical Ethyl chloride.   With sterile technique and under real time ultrasound guidance: Zilretta 32 mg injected into knee joint. Fluid seen entering the joint capsule.   Completed without difficulty   Advised to call if fevers/chills, erythema, induration, drainage, or persistent bleeding.   Images permanently stored and available for review in the ultrasound unit.  Impression: Technically successful ultrasound guided injection.  Lot number: [id number] for both injections      Assessment and Plan: 83 y.o.  female with chronic bilateral knee pain due to DJD.  Plan for Zilretta injection today.  This can repeated every 3 months if needed.  As for memory and dementia already on limb editing.  Apparently she has been on Aricept in the  past.  She may benefit from a repeat neuropsychological evaluation.  Will discuss with PCP.  Obesity and diabetes.  Could be using GLP-1's.  Will send a message to PCP as well.   PDMP not reviewed this encounter. Orders Placed This Encounter  Procedures   [place] LIMITED JOINT SPACE STRUCTURES LOW BILAT(NO LINKED CHARGES)    Reason for Exam (SYMPTOM  OR DIAGNOSIS REQUIRED):   bilateral knee pain    Preferred imaging location?:   [facility]   Meds ordered this encounter  Medications   Triamcinolone Acetonide (ZILRETTA) intra-articular injection 32 mg   Triamcinolone Acetonide (ZILRETTA) intra-articular injection 32 mg     Discussed warning signs or symptoms. Please see discharge instructions. Patient expresses understanding.   The above documentation has been reviewed and is accurate and complete Clementeen Graham, M.D.

## 2023-05-07 NOTE — Progress Notes (Deleted)
   Rubin Payor, PhD, LAT, ATC acting as a scribe for Clementeen Graham, MD.  Sudha Malaney is a 83 y.o. female who presents to [facility] at [facility] today for f/u bilateral knee pain.  Patient was last seen by Dr. Denyse Amass on 02/05/2023 and was given bilateral knee steroid injections.  She completed the [place] series, 3/3, bilaterally on 10/02/2022. Last Zilretta injections were on 06/25/22.  Today, patient reports***  Dx imaging: 08/08/20 R knee XR             01/16/20 L knee XR  Pertinent review of systems: ***  Relevant historical information: ***   Exam:  There were no vitals taken for this visit. General: Well Developed, well nourished, and in no acute distress.   MSK: ***    Lab and Radiology Results No results found for this or any previous visit (from the past 72 hours). No results found.     Assessment and Plan: 83 y.o. female with ***   PDMP not reviewed this encounter. No orders of the defined types were placed in this encounter.  No orders of the defined types were placed in this encounter.    Discussed warning signs or symptoms. Please see discharge instructions. Patient expresses understanding.   ***

## 2023-05-08 NOTE — Telephone Encounter (Signed)
 Unable to reach patient. LMTRC

## 2023-05-09 ENCOUNTER — Ambulatory Visit: Payer: PRIVATE HEALTH INSURANCE | Admitting: Family Medicine

## 2023-05-10 ENCOUNTER — Other Ambulatory Visit (HOSPITAL_COMMUNITY): Payer: Self-pay

## 2023-05-10 ENCOUNTER — Other Ambulatory Visit: Payer: Self-pay

## 2023-05-13 ENCOUNTER — Other Ambulatory Visit (HOSPITAL_COMMUNITY): Payer: Self-pay

## 2023-05-13 NOTE — Telephone Encounter (Signed)
 Unable to reach patient. LMTRC

## 2023-05-14 ENCOUNTER — Other Ambulatory Visit: Payer: Self-pay

## 2023-05-14 ENCOUNTER — Ambulatory Visit: Payer: PRIVATE HEALTH INSURANCE | Admitting: Family Medicine

## 2023-05-14 VITALS — BP 152/98 | HR 70 | Ht 59.0 in | Wt 160.0 lb

## 2023-05-14 DIAGNOSIS — M170 Bilateral primary osteoarthritis of knee: Secondary | ICD-10-CM

## 2023-05-14 DIAGNOSIS — G301 Alzheimer's disease with late onset: Secondary | ICD-10-CM

## 2023-05-14 DIAGNOSIS — M25562 Pain in left knee: Secondary | ICD-10-CM

## 2023-05-14 DIAGNOSIS — M25561 Pain in right knee: Secondary | ICD-10-CM

## 2023-05-14 DIAGNOSIS — E118 Type 2 diabetes mellitus with unspecified complications: Secondary | ICD-10-CM

## 2023-05-14 DIAGNOSIS — G8929 Other chronic pain: Secondary | ICD-10-CM

## 2023-05-14 DIAGNOSIS — F02A0 Dementia in other diseases classified elsewhere, mild, without behavioral disturbance, psychotic disturbance, mood disturbance, and anxiety: Secondary | ICD-10-CM

## 2023-05-14 MED ORDER — TRIAMCINOLONE ACETONIDE 32 MG IX SRER
32.0000 mg | Freq: Once | INTRA_ARTICULAR | Status: AC
  Administered 2023-05-14: 32 mg via INTRA_ARTICULAR

## 2023-05-14 NOTE — Patient Instructions (Addendum)
 Thank you for coming in today.   You received an injection today. Seek immediate medical attention if the joint becomes red, extremely painful, or is oozing fluid.   We will send a message to Dr. Yetta Barre about that psych evaluation and your diabetes medication.  Check back as needed

## 2023-05-15 NOTE — Telephone Encounter (Signed)
 Pt chose to proceed with Zilretta vs Gelsyn.

## 2023-05-15 NOTE — Telephone Encounter (Signed)
 Pt received BILAT Zilretta injections on 05/14/23. Can consider repeat injections on or after 08/07/23

## 2023-05-20 ENCOUNTER — Telehealth: Payer: Self-pay

## 2023-05-20 NOTE — Telephone Encounter (Signed)
 Copied from CRM [id number]. Topic: General - Other >> May 20, 2023  1:21 PM Kathryne Eriksson wrote: Reason for CRM: Patient Requesting A Call Back  >> May 20, 2023  1:23 PM Kathryne Eriksson wrote: Patient is requesting that [place], CMA gives her a call back. Patient call back number is [phone]

## 2023-05-21 NOTE — Telephone Encounter (Signed)
 Unable to reach patient. LMTRC

## 2023-05-21 NOTE — Telephone Encounter (Signed)
>>   May 21, 2023 12:35 PM Tiffany H wrote: Patient called to advise that refill for memantine (NAMENDA XR) 28 MG CP24 24 hr capsule [[id number]] is very expensive. She would like help with this. Declined to provide more information. Please assist.

## 2023-05-22 ENCOUNTER — Other Ambulatory Visit (HOSPITAL_COMMUNITY): Payer: Self-pay

## 2023-05-22 NOTE — Telephone Encounter (Signed)
 I have advised the patient that her medication will be to her next week and one of my coworkers will call her from the pharmacy to get her card information to add on file so that they can send her her medication. She gave a verbal understanding.

## 2023-05-27 ENCOUNTER — Telehealth: Payer: Self-pay | Admitting: Internal Medicine

## 2023-05-27 NOTE — Telephone Encounter (Signed)
 Copied from CRM [id number]. Topic: Clinical - Medication Question >> May 27, 2023 11:47 AM [name] wrote: Reason for CRM: Pt called to speak with Jaz unique regarding a referral for a memory Doctor. Please call pt back at [phone]

## 2023-05-28 ENCOUNTER — Other Ambulatory Visit: Payer: Self-pay

## 2023-05-28 ENCOUNTER — Encounter: Payer: Self-pay | Admitting: Pharmacist

## 2023-05-29 ENCOUNTER — Other Ambulatory Visit: Payer: Self-pay

## 2023-05-29 ENCOUNTER — Other Ambulatory Visit: Payer: Self-pay | Admitting: Internal Medicine

## 2023-05-29 DIAGNOSIS — F02B0 Dementia in other diseases classified elsewhere, moderate, without behavioral disturbance, psychotic disturbance, mood disturbance, and anxiety: Secondary | ICD-10-CM | POA: Insufficient documentation

## 2023-05-29 NOTE — Telephone Encounter (Signed)
Patient would like a referral to a neurologist

## 2023-05-29 NOTE — Telephone Encounter (Signed)
 Patient has been made aware that the referral has been placed. She gave a verbal understanding.

## 2023-05-30 ENCOUNTER — Other Ambulatory Visit (HOSPITAL_COMMUNITY): Payer: Self-pay

## 2023-05-30 ENCOUNTER — Other Ambulatory Visit: Payer: Self-pay

## 2023-06-10 ENCOUNTER — Telehealth: Payer: Self-pay | Admitting: Internal Medicine

## 2023-06-10 ENCOUNTER — Encounter: Payer: Self-pay | Admitting: Physician Assistant

## 2023-06-10 NOTE — Telephone Encounter (Unsigned)
 Copied from CRM [id number]. Topic: General - Other >> Jun 10, 2023 11:59 AM Kathryne Eriksson wrote: Reason for CRM: Requesting A Call Back >> Jun 10, 2023 12:04 PM Kathryne Eriksson wrote: Patient is requesting a call from [place], [place]  states that it's very important but wouldn't share any specific information. Patient call back number is [phone]

## 2023-06-10 NOTE — Telephone Encounter (Signed)
 Spoke with patient. Advised her that the notes stated that they have tried to reach her. Gave her their number and address to return their call. She gave a verbal understanding.

## 2023-06-11 DIAGNOSIS — N898 Other specified noninflammatory disorders of vagina: Secondary | ICD-10-CM | POA: Diagnosis not present

## 2023-06-11 DIAGNOSIS — N952 Postmenopausal atrophic vaginitis: Secondary | ICD-10-CM | POA: Diagnosis not present

## 2023-06-11 DIAGNOSIS — Z01419 Encounter for gynecological examination (general) (routine) without abnormal findings: Secondary | ICD-10-CM | POA: Diagnosis not present

## 2023-06-26 ENCOUNTER — Encounter (HOSPITAL_BASED_OUTPATIENT_CLINIC_OR_DEPARTMENT_OTHER): Payer: PPO | Admitting: Pulmonary Disease

## 2023-07-01 ENCOUNTER — Other Ambulatory Visit (HOSPITAL_COMMUNITY): Payer: Self-pay

## 2023-07-01 ENCOUNTER — Other Ambulatory Visit: Payer: Self-pay

## 2023-07-01 ENCOUNTER — Other Ambulatory Visit: Payer: Self-pay | Admitting: Internal Medicine

## 2023-07-01 DIAGNOSIS — I5032 Chronic diastolic (congestive) heart failure: Secondary | ICD-10-CM

## 2023-07-01 DIAGNOSIS — I10 Essential (primary) hypertension: Secondary | ICD-10-CM

## 2023-07-01 MED ORDER — CARVEDILOL 3.125 MG PO TABS
3.1250 mg | ORAL_TABLET | Freq: Two times a day (BID) | ORAL | 0 refills | Status: DC
  Filled 2023-07-01: qty 180, 90d supply, fill #0

## 2023-07-02 ENCOUNTER — Ambulatory Visit: Payer: PPO | Admitting: Internal Medicine

## 2023-07-02 ENCOUNTER — Encounter: Payer: Self-pay | Admitting: Internal Medicine

## 2023-07-02 VITALS — BP 146/88 | HR 87 | Temp 97.7°F | Ht 59.0 in | Wt 159.0 lb

## 2023-07-02 DIAGNOSIS — B3731 Acute candidiasis of vulva and vagina: Secondary | ICD-10-CM

## 2023-07-02 DIAGNOSIS — E118 Type 2 diabetes mellitus with unspecified complications: Secondary | ICD-10-CM

## 2023-07-02 DIAGNOSIS — E785 Hyperlipidemia, unspecified: Secondary | ICD-10-CM | POA: Diagnosis not present

## 2023-07-02 DIAGNOSIS — G4452 New daily persistent headache (NDPH): Secondary | ICD-10-CM | POA: Diagnosis not present

## 2023-07-02 DIAGNOSIS — F02A0 Dementia in other diseases classified elsewhere, mild, without behavioral disturbance, psychotic disturbance, mood disturbance, and anxiety: Secondary | ICD-10-CM | POA: Diagnosis not present

## 2023-07-02 DIAGNOSIS — G301 Alzheimer's disease with late onset: Secondary | ICD-10-CM | POA: Diagnosis not present

## 2023-07-02 DIAGNOSIS — F3342 Major depressive disorder, recurrent, in full remission: Secondary | ICD-10-CM

## 2023-07-02 DIAGNOSIS — E039 Hypothyroidism, unspecified: Secondary | ICD-10-CM | POA: Diagnosis not present

## 2023-07-02 DIAGNOSIS — I5032 Chronic diastolic (congestive) heart failure: Secondary | ICD-10-CM

## 2023-07-02 DIAGNOSIS — I10 Essential (primary) hypertension: Secondary | ICD-10-CM

## 2023-07-02 DIAGNOSIS — G4733 Obstructive sleep apnea (adult) (pediatric): Secondary | ICD-10-CM

## 2023-07-02 LAB — URINALYSIS, ROUTINE W REFLEX MICROSCOPIC
Bilirubin Urine: NEGATIVE
Hgb urine dipstick: NEGATIVE
Ketones, ur: NEGATIVE
Leukocytes,Ua: NEGATIVE
Nitrite: NEGATIVE
RBC / HPF: NONE SEEN (ref 0–?)
Specific Gravity, Urine: 1.02 (ref 1.000–1.030)
Urine Glucose: >=1000 — AB
Urobilinogen, UA: 1 (ref 0.0–1.0)
pH: 6 (ref 5.0–8.0)

## 2023-07-02 LAB — MICROALBUMIN / CREATININE URINE RATIO
Creatinine,U: 146.6 mg/dL
Microalb Creat Ratio: 11.1 mg/g (ref 0.0–30.0)
Microalb, Ur: 1.6 mg/dL (ref 0.0–1.9)

## 2023-07-02 LAB — BASIC METABOLIC PANEL WITH GFR
BUN: 16 mg/dL (ref 6–23)
CO2: 27 meq/L (ref 19–32)
Calcium: 9.1 mg/dL (ref 8.4–10.5)
Chloride: 107 meq/L (ref 96–112)
Creatinine, Ser: 0.8 mg/dL (ref 0.40–1.20)
GFR: 67.82 mL/min (ref >60.00)
Glucose, Bld: 101 mg/dL — ABNORMAL HIGH (ref 70–99)
Potassium: 3.9 meq/L (ref 3.5–5.1)
Sodium: 141 meq/L (ref 135–145)

## 2023-07-02 LAB — HEMOGLOBIN A1C: Hgb A1c MFr Bld: 6.2 % (ref 4.6–6.5)

## 2023-07-02 LAB — TSH: TSH: 2.34 u[IU]/mL (ref 0.35–5.50)

## 2023-07-02 NOTE — Patient Instructions (Signed)
 Hypertension, Adult High blood pressure (hypertension) is when the force of blood pumping through the arteries is too strong. The arteries are the blood vessels that carry blood from the heart throughout the body. Hypertension forces the heart to work harder to pump blood and may cause arteries to become narrow or stiff. Untreated or uncontrolled hypertension can lead to a heart attack, heart failure, a stroke, kidney disease, and other problems. A blood pressure reading consists of a higher number over a lower number. Ideally, your blood pressure should be below 120/80. The first ("top") number is called the systolic pressure. It is a measure of the pressure in your arteries as your heart beats. The second ("bottom") number is called the diastolic pressure. It is a measure of the pressure in your arteries as the heart relaxes. What are the causes? The exact cause of this condition is not known. There are some conditions that result in high blood pressure. What increases the risk? Certain factors may make you more likely to develop high blood pressure. Some of these risk factors are under your control, including: Smoking. Not getting enough exercise or physical activity. Being overweight. Having too much fat, sugar, calories, or salt (sodium) in your diet. Drinking too much alcohol. Other risk factors include: Having a personal history of heart disease, diabetes, high cholesterol, or kidney disease. Stress. Having a family history of high blood pressure and high cholesterol. Having obstructive sleep apnea. Age. The risk increases with age. What are the signs or symptoms? High blood pressure may not cause symptoms. Very high blood pressure (hypertensive crisis) may cause: Headache. Fast or irregular heartbeats (palpitations). Shortness of breath. Nosebleed. Nausea and vomiting. Vision changes. Severe chest pain, dizziness, and seizures. How is this diagnosed? This condition is diagnosed by  measuring your blood pressure while you are seated, with your arm resting on a flat surface, your legs uncrossed, and your feet flat on the floor. The cuff of the blood pressure monitor will be placed directly against the skin of your upper arm at the level of your heart. Blood pressure should be measured at least twice using the same arm. Certain conditions can cause a difference in blood pressure between your right and left arms. If you have a high blood pressure reading during one visit or you have normal blood pressure with other risk factors, you may be asked to: Return on a different day to have your blood pressure checked again. Monitor your blood pressure at home for 1 week or longer. If you are diagnosed with hypertension, you may have other blood or imaging tests to help your health care provider understand your overall risk for other conditions. How is this treated? This condition is treated by making healthy lifestyle changes, such as eating healthy foods, exercising more, and reducing your alcohol intake. You may be referred for counseling on a healthy diet and physical activity. Your health care provider may prescribe medicine if lifestyle changes are not enough to get your blood pressure under control and if: Your systolic blood pressure is above 130. Your diastolic blood pressure is above 80. Your personal target blood pressure may vary depending on your medical conditions, your age, and other factors. Follow these instructions at home: Eating and drinking  Eat a diet that is high in fiber and potassium, and low in sodium, added sugar, and fat. An example of this eating plan is called the DASH diet. DASH stands for Dietary Approaches to Stop Hypertension. To eat this way: Eat  plenty of fresh fruits and vegetables. Try to fill one half of your plate at each meal with fruits and vegetables. Eat whole grains, such as whole-wheat pasta, brown rice, or whole-grain bread. Fill about one  fourth of your plate with whole grains. Eat or drink low-fat dairy products, such as skim milk or low-fat yogurt. Avoid fatty cuts of meat, processed or cured meats, and poultry with skin. Fill about one fourth of your plate with lean proteins, such as fish, chicken without skin, beans, eggs, or tofu. Avoid pre-made and processed foods. These tend to be higher in sodium, added sugar, and fat. Reduce your daily sodium intake. Many people with hypertension should eat less than 1,500 mg of sodium a day. Do not drink alcohol if: Your health care provider tells you not to drink. You are pregnant, may be pregnant, or are planning to become pregnant. If you drink alcohol: Limit how much you have to: 0-1 drink a day for women. 0-2 drinks a day for men. Know how much alcohol is in your drink. In the [place] one drink equals one 12 oz bottle of beer (355 mL), one 5 oz glass of wine (148 mL), or one 1 oz glass of hard liquor (44 mL). Lifestyle  Work with your health care provider to maintain a healthy body weight or to lose weight. Ask what an ideal weight is for you. Get at least 30 minutes of exercise that causes your heart to beat faster (aerobic exercise) most days of the week. Activities may include walking, swimming, or biking. Include exercise to strengthen your muscles (resistance exercise), such as Pilates or lifting weights, as part of your weekly exercise routine. Try to do these types of exercises for 30 minutes at least 3 days a week. Do not use any products that contain nicotine or tobacco. These products include cigarettes, chewing tobacco, and vaping devices, such as e-cigarettes. If you need help quitting, ask your health care provider. Monitor your blood pressure at home as told by your health care provider. Keep all follow-up visits. This is important. Medicines Take over-the-counter and prescription medicines only as told by your health care provider. Follow directions carefully. Blood  pressure medicines must be taken as prescribed. Do not skip doses of blood pressure medicine. Doing this puts you at risk for problems and can make the medicine less effective. Ask your health care provider about side effects or reactions to medicines that you should watch for. Contact a health care provider if you: Think you are having a reaction to a medicine you are taking. Have headaches that keep coming back (recurring). Feel dizzy. Have swelling in your ankles. Have trouble with your vision. Get help right away if you: Develop a severe headache or confusion. Have unusual weakness or numbness. Feel faint. Have severe pain in your chest or abdomen. Vomit repeatedly. Have trouble breathing. These symptoms may be an emergency. Get help right away. Call 911. Do not wait to see if the symptoms will go away. Do not drive yourself to the hospital. Summary Hypertension is when the force of blood pumping through your arteries is too strong. If this condition is not controlled, it may put you at risk for serious complications. Your personal target blood pressure may vary depending on your medical conditions, your age, and other factors. For most people, a normal blood pressure is less than 120/80. Hypertension is treated with lifestyle changes, medicines, or a combination of both. Lifestyle changes include losing weight, eating a healthy,  low-sodium diet, exercising more, and limiting alcohol. This information is not intended to replace advice given to you by your health care provider. Make sure you discuss any questions you have with your health care provider. Document Revised: 01/10/2021 Document Reviewed: 01/10/2021 Elsevier Patient Education  2024 [employer].

## 2023-07-02 NOTE — Progress Notes (Unsigned)
 Subjective:  Patient ID: [name], female    DOB: 09-15-1940  Age: 83 y.o. MRN: [id number]  CC: No chief complaint on file.   HPI [name] presents for ***  Outpatient Medications Prior to Visit  Medication Sig Dispense Refill   acetaminophen (TYLENOL) 500 MG tablet Take 500 mg by mouth as needed for mild pain.     albuterol (VENTOLIN HFA) 108 (90 Base) MCG/ACT inhaler TAKE 2 PUFFS BY MOUTH EVERY 6 HOURS AS NEEDED FOR WHEEZE OR SHORTNESS OF BREATH 8.5 each 0   AMBULATORY NON FORMULARY MEDICATION Folding walker with front wheels.  Use as needed. Disp 1 R26.89 1 each 0   Blood Glucose Calibration (ACCU-CHEK GUIDE CONTROL) LIQD 1 Act by In Vitro route 2 (two) times daily as needed. 1 each 3   Blood Glucose Monitoring Suppl (ACCU-CHEK GUIDE ME) w/Device KIT 1 Act by Does not apply route 2 (two) times daily as needed. 2 kit 2   carvedilol (COREG) 3.125 MG tablet Take 1 tablet (3.125 mg total) by mouth 2 (two) times daily with a meal. 180 tablet 0   diclofenac Sodium (VOLTAREN) 1 % GEL Apply 4 g topically 4 (four) times daily. 100 g 5   empagliflozin (JARDIANCE) 10 MG TABS tablet Take 1 tablet (10 mg total) by mouth daily before breakfast. 90 tablet 1   estradiol (ESTRACE) 0.1 MG/GM vaginal cream Place 1 Applicatorful vaginally daily.     FLUoxetine (PROZAC) 40 MG capsule Take 1 capsule (40 mg total) by mouth daily. 90 capsule 0   glucose blood (ACCU-CHEK GUIDE) test strip 1 each by Other route 2 (two) times daily. 300 each 1   levothyroxine (SYNTHROID) 50 MCG tablet Take 1 tablet (50 mcg total) by mouth daily. 100 tablet 0   losartan (COZAAR) 100 MG tablet Take 1 tablet (100 mg total) by mouth daily. 90 tablet 0   memantine (NAMENDA XR) 28 MG CP24 24 hr capsule Take 1 capsule (28 mg total) by mouth daily. 90 capsule 1   nitroGLYCERIN (NITROSTAT) 0.4 MG SL tablet Place 1 tablet (0.4 mg total) under the tongue every 5 (five) minutes as needed for chest pain. 50 tablet 0    rosuvastatin (CRESTOR) 10 MG tablet Take 1 tablet (10 mg total) by mouth daily. 100 tablet 0   spironolactone (ALDACTONE) 25 MG tablet Take 1 tablet (25 mg total) by mouth daily. 90 tablet 0   No facility-administered medications prior to visit.    ROS Review of Systems  Objective:  BP (!) 146/88 (BP Location: Left Arm, Patient Position: Sitting, Cuff Size: Large)   Pulse 87   Temp 97.7 F (36.5 C) (Oral)   Ht 4\' 11"  (1.499 m)   Wt 159 lb (72.1 kg)   SpO2 95%   BMI 32.11 kg/m   BP Readings from Last 3 Encounters:  07/02/23 (!) 146/88  05/14/23 (!) 152/98  04/30/23 116/80    Wt Readings from Last 3 Encounters:  07/02/23 159 lb (72.1 kg)  05/14/23 160 lb (72.6 kg)  04/30/23 153 lb (69.4 kg)    Physical Exam  Lab Results  Component Value Date   WBC 9.5 03/12/2023   HGB 14.4 03/12/2023   HCT 43.7 03/12/2023   PLT 256.0 03/12/2023   GLUCOSE 99 03/12/2023   CHOL 272 (H) 11/29/2022   TRIG 97.0 11/29/2022   HDL 108.20 11/29/2022   LDLDIRECT 120.6 11/24/2012   LDLCALC 145 (H) 11/29/2022   ALT 24 03/12/2023   AST 17 03/12/2023  NA 138 03/12/2023   K 3.7 03/12/2023   CL 103 03/12/2023   CREATININE 0.86 03/12/2023   BUN 20 03/12/2023   CO2 25 03/12/2023   TSH 1.13 03/12/2023   INR 1.0 11/20/2021   HGBA1C 6.2 03/12/2023   MICROALBUR 1.7 02/15/2022    MM 3D SCREENING MAMMOGRAM BILATERAL BREAST Result Date: 04/01/2023 CLINICAL DATA:  Screening. EXAM: DIGITAL SCREENING BILATERAL MAMMOGRAM WITH TOMOSYNTHESIS AND CAD TECHNIQUE: Bilateral screening digital craniocaudal and mediolateral oblique mammograms were obtained. Bilateral screening digital breast tomosynthesis was performed. The images were evaluated with computer-aided detection. COMPARISON:  Previous exam(s). ACR Breast Density Category c: The breasts are heterogeneously dense, which may obscure small masses. FINDINGS: There are no findings suspicious for malignancy. IMPRESSION: No mammographic evidence of  malignancy. A result letter of this screening mammogram will be mailed directly to the patient. RECOMMENDATION: Screening mammogram in one year. (Code:SM-B-01Y) BI-RADS CATEGORY  1: Negative. Electronically Signed   By: Anna Barnes M.D.   On: 04/01/2023 14:50    Assessment & Plan:  Essential hypertension -     Basic metabolic panel with GFR; Future -     TSH; Future -     Urinalysis, Routine w reflex microscopic; Future  Type II diabetes mellitus with manifestations (HCC) -     Basic metabolic panel with GFR; Future -     Urinalysis, Routine w reflex microscopic; Future -     Hemoglobin A1c; Future -     Microalbumin / creatinine urine ratio; Future  Acquired hypothyroidism -     TSH; Future  OBSTRUCTIVE SLEEP APNEA     Follow-up: No follow-ups on file.  Sandra Crouch, MD

## 2023-07-03 ENCOUNTER — Other Ambulatory Visit: Payer: Self-pay

## 2023-07-03 ENCOUNTER — Other Ambulatory Visit (HOSPITAL_COMMUNITY): Payer: Self-pay

## 2023-07-03 DIAGNOSIS — B3731 Acute candidiasis of vulva and vagina: Secondary | ICD-10-CM | POA: Insufficient documentation

## 2023-07-03 MED ORDER — FLUOXETINE HCL 40 MG PO CAPS
40.0000 mg | ORAL_CAPSULE | Freq: Every day | ORAL | 0 refills | Status: DC
  Filled 2023-07-03: qty 90, 90d supply, fill #0

## 2023-07-03 MED ORDER — EMPAGLIFLOZIN 10 MG PO TABS
10.0000 mg | ORAL_TABLET | Freq: Every day | ORAL | 0 refills | Status: DC
  Filled 2023-07-03: qty 90, 90d supply, fill #0

## 2023-07-03 MED ORDER — LOSARTAN POTASSIUM 100 MG PO TABS
100.0000 mg | ORAL_TABLET | Freq: Every day | ORAL | 0 refills | Status: DC
  Filled 2023-07-03: qty 90, 90d supply, fill #0

## 2023-07-03 MED ORDER — MEMANTINE HCL ER 28 MG PO CP24
28.0000 mg | ORAL_CAPSULE | Freq: Every day | ORAL | 1 refills | Status: DC
  Filled 2023-07-03 – 2023-09-16 (×2): qty 90, 90d supply, fill #0
  Filled 2023-12-21: qty 90, 90d supply, fill #1

## 2023-07-03 MED ORDER — ROSUVASTATIN CALCIUM 10 MG PO TABS
10.0000 mg | ORAL_TABLET | Freq: Every day | ORAL | 0 refills | Status: DC
  Filled 2023-07-03: qty 100, 100d supply, fill #0

## 2023-07-03 MED ORDER — LEVOTHYROXINE SODIUM 50 MCG PO TABS
50.0000 ug | ORAL_TABLET | Freq: Every day | ORAL | 0 refills | Status: DC
  Filled 2023-07-03: qty 100, 100d supply, fill #0

## 2023-07-03 MED ORDER — FLUCONAZOLE 150 MG PO TABS
150.0000 mg | ORAL_TABLET | Freq: Once | ORAL | 1 refills | Status: AC
  Filled 2023-07-03: qty 1, 1d supply, fill #0
  Filled 2023-07-05: qty 1, 1d supply, fill #1

## 2023-07-04 ENCOUNTER — Other Ambulatory Visit (HOSPITAL_COMMUNITY): Payer: Self-pay

## 2023-07-04 DIAGNOSIS — G4452 New daily persistent headache (NDPH): Secondary | ICD-10-CM | POA: Insufficient documentation

## 2023-07-11 ENCOUNTER — Other Ambulatory Visit

## 2023-07-17 ENCOUNTER — Other Ambulatory Visit

## 2023-07-18 ENCOUNTER — Ambulatory Visit
Admission: RE | Admit: 2023-07-18 | Discharge: 2023-07-18 | Disposition: A | Discharge status: Home / Self Care | Source: Ambulatory Visit | Attending: Internal Medicine | Admitting: Internal Medicine

## 2023-07-18 DIAGNOSIS — R519 Headache, unspecified: Secondary | ICD-10-CM | POA: Diagnosis not present

## 2023-07-18 DIAGNOSIS — G301 Alzheimer's disease with late onset: Secondary | ICD-10-CM

## 2023-07-18 DIAGNOSIS — G4452 New daily persistent headache (NDPH): Secondary | ICD-10-CM

## 2023-07-18 DIAGNOSIS — R413 Other amnesia: Secondary | ICD-10-CM | POA: Diagnosis not present

## 2023-07-23 ENCOUNTER — Encounter: Payer: Self-pay | Admitting: Internal Medicine

## 2023-07-23 ENCOUNTER — Ambulatory Visit

## 2023-07-23 ENCOUNTER — Encounter: Payer: Self-pay | Admitting: Physician Assistant

## 2023-07-23 ENCOUNTER — Ambulatory Visit (INDEPENDENT_AMBULATORY_CARE_PROVIDER_SITE_OTHER): Admitting: Physician Assistant

## 2023-07-23 VITALS — BP 135/90 | HR 78 | Resp 20 | Ht 59.0 in | Wt 162.0 lb

## 2023-07-23 DIAGNOSIS — R413 Other amnesia: Secondary | ICD-10-CM

## 2023-07-23 NOTE — Patient Instructions (Addendum)
 It was a pleasure to see you today at our office.   Recommendations:  Neurocognitive evaluation at our office   Follow up in 6   months    For psychiatric meds, mood meds: Please have your primary care physician manage these medications.  If you have any severe symptoms of a stroke, or other severe issues such as confusion,severe chills or fever, etc call 911 or go to the ER as you may need to be evaluated further     For assessment of decision of mental capacity and competency:  Call Dr. Laverne Potter, geriatric psychiatrist at [phone]  Counseling regarding caregiver distress, including caregiver depression, anxiety and issues regarding community resources, adult day care programs, adult living facilities, or memory care questions:  please contact your  Primary Doctor's Social Worker   Whom to call: Memory  decline, memory medications: Call our office [phone]    [email or web address]   RECOMMENDATIONS FOR ALL PATIENTS WITH MEMORY PROBLEMS: 1. Continue to exercise (Recommend 30 minutes of walking everyday, or 3 hours every week) 2. Increase social interactions - continue going to [place] and enjoy social gatherings with friends and family 3. Eat healthy, avoid fried foods and eat more fruits and vegetables 4. Maintain adequate blood pressure, blood sugar, and blood cholesterol level. Reducing the risk of stroke and cardiovascular disease also helps promoting better memory. 5. Avoid stressful situations. Live a simple life and avoid aggravations. Organize your time and prepare for the next day in anticipation. 6. Sleep well, avoid any interruptions of sleep and avoid any distractions in the bedroom that may interfere with adequate sleep quality 7. Avoid sugar, avoid sweets as there is a strong link between excessive sugar intake, diabetes, and cognitive impairment We discussed the Mediterranean diet, which has been shown to  help patients reduce the risk of progressive memory disorders and reduces cardiovascular risk. This includes eating fish, eat fruits and green leafy vegetables, nuts like almonds and hazelnuts, walnuts, and also use olive oil. Avoid fast foods and fried foods as much as possible. Avoid sweets and sugar as sugar use has been linked to worsening of memory function.  There is always a concern of gradual progression of memory problems. If this is the case, then we may need to adjust level of care according to patient needs. Support, both to the patient and caregiver, should then be put into place.      You have been referred for a neuropsychological evaluation (i.e., evaluation of memory and thinking abilities). Please bring someone with you to this appointment if possible, as it is helpful for the doctor to hear from both you and another adult who knows you well. Please bring eyeglasses and hearing aids if you wear them.    The evaluation will take approximately 3 hours and has two parts:   The first part is a clinical interview with the neuropsychologist (Dr. Kitty Perkins or Dr. Donavon Fudge). During the interview, the neuropsychologist will speak with you and the individual you brought to the appointment.    The second part of the evaluation is testing with the doctor's technician Bernabe Brew or Burdette Carolin). During the testing, the technician will ask you to remember different types of material, solve problems, and answer some questionnaires. Your family member will not be present for this portion of the evaluation.   Please note: We must reserve several hours of the neuropsychologist's time and the psychometrician's time for your evaluation appointment. As such, there is a No-Show fee of $100. If you  are unable to attend any of your appointments, please contact our office as soon as possible to reschedule.      DRIVING: Regarding driving, in patients with progressive memory problems, driving will be impaired. We advise to  have someone else do the driving if trouble finding directions or if minor accidents are reported. Independent driving assessment is available to determine safety of driving.   If you are interested in the driving assessment, you can contact the following:  [facility] in [place] [phone]  [facility] [phone]  [facility] [phone]  [facility] [phone] or [phone]   FALL PRECAUTIONS: Be cautious when walking. Scan the area for obstacles that may increase the risk of trips and falls. When getting up in the mornings, sit up at the edge of the bed for a few minutes before getting out of bed. Consider elevating the bed at the head end to avoid drop of blood pressure when getting up. Walk always in a well-lit room (use night lights in the walls). Avoid area rugs or power cords from appliances in the middle of the walkways. Use a walker or a cane if necessary and consider physical therapy for balance exercise. Get your eyesight checked regularly.  FINANCIAL OVERSIGHT: Supervision, especially oversight when making financial decisions or transactions is also recommended.  HOME SAFETY: Consider the safety of the kitchen when operating appliances like stoves, microwave oven, and blender. Consider having supervision and share cooking responsibilities until no longer able to participate in those. Accidents with firearms and other hazards in the house should be identified and addressed as well.   ABILITY TO BE LEFT ALONE: If patient is unable to contact 911 operator, consider using LifeLine, or when the need is there, arrange for someone to stay with patients. Smoking is a fire hazard, consider supervision or cessation. Risk of wandering should be assessed by caregiver and if detected at any point, supervision and safe proof recommendations should be instituted.  MEDICATION SUPERVISION: Inability to self-administer medication needs to  be constantly addressed. Implement a mechanism to ensure safe administration of the medications.      Mediterranean Diet A Mediterranean diet refers to food and lifestyle choices that are based on the traditions of countries located on the [employer] [place]. This way of eating has been shown to help prevent certain conditions and improve outcomes for people who have chronic diseases, like kidney disease and heart disease. What are tips for following this plan? Lifestyle  Cook and eat meals together with your family, when possible. Drink enough fluid to keep your urine clear or pale yellow. Be physically active every day. This includes: Aerobic exercise like running or swimming. Leisure activities like gardening, walking, or housework. Get 7-8 hours of sleep each night. If recommended by your health care provider, drink red wine in moderation. This means 1 glass a day for nonpregnant women and 2 glasses a day for men. A glass of wine equals 5 oz (150 mL). Reading food labels  Check the serving size of packaged foods. For foods such as rice and pasta, the serving size refers to the amount of cooked product, not dry. Check the total fat in packaged foods. Avoid foods that have saturated fat or trans fats. Check the ingredients list for added sugars, such as corn syrup. Shopping  At the grocery store, buy most of your food from the areas near the walls of the store. This includes: Fresh fruits and vegetables (produce). Grains, beans, nuts, and  seeds. Some of these may be available in unpackaged forms or large amounts (in bulk). Fresh seafood. Poultry and eggs. Low-fat dairy products. Buy whole ingredients instead of prepackaged foods. Buy fresh fruits and vegetables in-season from local farmers markets. Buy frozen fruits and vegetables in resealable bags. If you do not have access to quality fresh seafood, buy precooked frozen shrimp or canned fish, such as tuna, salmon, or sardines. Buy  small amounts of raw or cooked vegetables, salads, or olives from the deli or salad bar at your store. Stock your pantry so you always have certain foods on hand, such as olive oil, canned tuna, canned tomatoes, rice, pasta, and beans. Cooking  Cook foods with extra-virgin olive oil instead of using butter or other vegetable oils. Have meat as a side dish, and have vegetables or grains as your main dish. This means having meat in small portions or adding small amounts of meat to foods like pasta or stew. Use beans or vegetables instead of meat in common dishes like chili or lasagna. Experiment with different cooking methods. Try roasting or broiling vegetables instead of steaming or sauteing them. Add frozen vegetables to soups, stews, pasta, or rice. Add nuts or seeds for added healthy fat at each meal. You can add these to yogurt, salads, or vegetable dishes. Marinate fish or vegetables using olive oil, lemon juice, garlic, and fresh herbs. Meal planning  Plan to eat 1 vegetarian meal one day each week. Try to work up to 2 vegetarian meals, if possible. Eat seafood 2 or more times a week. Have healthy snacks readily available, such as: Vegetable sticks with hummus. Greek yogurt. Fruit and nut trail mix. Eat balanced meals throughout the week. This includes: Fruit: 2-3 servings a day Vegetables: 4-5 servings a day Low-fat dairy: 2 servings a day Fish, poultry, or lean meat: 1 serving a day Beans and legumes: 2 or more servings a week Nuts and seeds: 1-2 servings a day Whole grains: 6-8 servings a day Extra-virgin olive oil: 3-4 servings a day Limit red meat and sweets to only a few servings a month What are my food choices? Mediterranean diet Recommended Grains: Whole-grain pasta. Brown rice. Bulgar wheat. Polenta. Couscous. Whole-wheat bread. Dwyane Glad. Vegetables: Artichokes. Beets. Broccoli. Cabbage. Carrots. Eggplant. Green beans. Chard. Kale. Spinach. Onions. Leeks. Peas.  Squash. Tomatoes. Peppers. Radishes. Fruits: Apples. Apricots. Avocado. Berries. Bananas. Cherries. Dates. Figs. Grapes. Lemons. Melon. Oranges. Peaches. Plums. Pomegranate. Meats and other protein foods: Beans. Almonds. Sunflower seeds. Pine nuts. Peanuts. Cod. Salmon. Scallops. Shrimp. Tuna. Tilapia. Clams. Oysters. Eggs. Dairy: Low-fat milk. Cheese. Greek yogurt. Beverages: Water. Red wine. Herbal tea. Fats and oils: Extra virgin olive oil. Avocado oil. Grape seed oil. Sweets and desserts: [place] yogurt with honey. Baked apples. Poached pears. Trail mix. Seasoning and other foods: Basil. Cilantro. Coriander. Cumin. Mint. Parsley. Sage. Rosemary. Tarragon. Garlic. Oregano. Thyme. Pepper. Balsalmic vinegar. Tahini. Hummus. Tomato sauce. Olives. Mushrooms. Limit these Grains: Prepackaged pasta or rice dishes. Prepackaged cereal with added sugar. Vegetables: Deep fried potatoes (french fries). Fruits: Fruit canned in syrup. Meats and other protein foods: Beef. Pork. Lamb. Poultry with skin. Hot dogs. Helene Loader. Dairy: Ice cream. Sour cream. Whole milk. Beverages: Juice. Sugar-sweetened soft drinks. Beer. Liquor and spirits. Fats and oils: Butter. Canola oil. Vegetable oil. Beef fat (tallow). Lard. Sweets and desserts: Cookies. Cakes. Pies. Candy. Seasoning and other foods: Mayonnaise. Premade sauces and marinades. The items listed may not be a complete list. Talk with your dietitian about what dietary choices  are right for you. Summary The Mediterranean diet includes both food and lifestyle choices. Eat a variety of fresh fruits and vegetables, beans, nuts, seeds, and whole grains. Limit the amount of red meat and sweets that you eat. Talk with your health care provider about whether it is safe for you to drink red wine in moderation. This means 1 glass a day for nonpregnant women and 2 glasses a day for men. A glass of wine equals 5 oz (150 mL). This information is not intended to replace advice  given to you by your health care provider. Make sure you discuss any questions you have with your health care provider. Document Released: 10/27/2015 Document Revised: 11/29/2015 Document Reviewed: 10/27/2015 Elsevier Interactive Patient Education  2017 [employer].

## 2023-07-23 NOTE — Progress Notes (Addendum)
 Assessment/Plan:     [name] is a delightful 83 y.o. year old RH female with a history of hypertension, hyperlipidemia, depression, DM2, CHF with chronic hypoxemic respiratory failure CKD,, hypothyroidism, OSA, chronic pain, HOH, junctional bradycardia, and a diagnosis of mild late onset Alzheimer's dementia without behavioral disturbance by PCP while living in [place]  2 years ago.  She is seen today for evaluation of memory loss. MoCA today is 22/30.  Recent MRI of the brain from 07/18/2023, personally reviewed is remarkable for mild to moderate cerebral and mild hippocampal atrophy, mild chronic ischemic changes, no acute findings.  She is on memantine  28 mg XR daily, tolerating well. Patient is able to participate on ADLs  but no longer drives.  Denies any mood changes although there are moments of irritability during the visit.  She may find herself easily distracted with surrounding behavior from someone else at which time she may not go back to the conversation, wonder if there is a component of attention.  Memory Impairment of unclear etiology, concern for Alzheimer's disease  Check B12 when she has an appointment with her PCP (patient prefers it at [facility]) Neurocognitive testing for diagnostic clarity Continue memantine  28 mg XR daily Recommend good control of cardiovascular risk factors.   Continue to control mood as per PCP [place] up in  6 months  Subjective:    The patient is here alone.  How long did patient have memory difficulties?  I think for about 2 years when she thought it was due to "old age ".  She was diagnosed in [place]  about 2 years ago, but since having COVID, RSV and pneumonia in October 2024, she reports mild worsening of her symptoms. Patient has some difficulty remembering new information, recent conversations and names of people but was again, she attributes it to being 83 years old.  She enjoys praying and listening to music.   "I am and only child so I am  used to entertaining myself"-she says repeats oneself?  Endorsed Disoriented when walking into a room?  Denies   Leaving objects in unusual places?   Denies.  Wandering behavior? Denies.   Any personality changes, or depression, anxiety?  Has moments of irritability, and she admits to feeling depressed, "feeling old ".  She avoids social interaction despite living in [place], eating meals in her room rather than spending some time with other residents which irritates her. Hallucinations or paranoia?  Denies.   Seizures? Denies.    Any sleep changes?  Sleeps well .  She reports (and is laughing about it) occasional "wet dreams".  Denies  REM behavior or sleepwalking.   Sleep apnea? Denies, but  uses O2 at night .   Any hygiene concerns?  Denies.   Independent of bathing and dressing?  Needs assistance getting dressed. Who is in charge of the medications?  Patient is in charge Who is in charge of the finances?   Patient is in charge    Any changes in appetite?   Her appetite is "too good, I am always hungry".    Patient have trouble swallowing?  Denies.   Does the patient cook?  No   Any headaches?  ""Sometimes ", she attributes it to sinus headache on the left frontal area, there is no nausea or vomiting, no photophobia, this is not frequent or debilitating. Chronic back pain?  Denies.   Ambulates with difficulty? Denies, does not like to walk outside because of allergies Recent falls or head injuries?  Denies.     Vision changes? Denies. Stroke like symptoms?  Denies.   Any tremors?  Denies.   Any anosmia?  Denies.   Any incontinence of urine? Denies. Uses diapers "because they give it  to me, everyone wears pull-ups " Any bowel dysfunction? Denies.      Patient lives at [facility].  While living in [place] , she states that she moved from there because there was mold in the apartment.    History of heavy alcohol intake? Denies.   History of heavy tobacco use? Denies.    Family history of dementia?   She was adopted Family history is unknown.  Does patient drive? No longer drives  Latest labs April 2025 TSH 2.3, A1c 6.2  Retired from [employer] entry in 1998, "they forced me to retire "  MRI of the brain  (performed on 07/18/2023), but he has been personally reviewed remarkable for cerebral and hippocampal atrophy, mild chronic ischemic changes, no acute findings  Allergies  Allergen Reactions   Shellfish-Derived Products Anaphylaxis   Amoxicillin  Other (See Comments)    GI Issues.   Codeine Sulfate    Hydrochlorothiazide    Iodine    Iohexol       Code: HIVES, Desc: PATIENT STATES SHE BREAKS OUT IN HIVES FROM IV DYE 02/27/08/RM, Onset Date: [id number]    Paroxetine    Penicillins    Prednisone     Red Dye #28 Other (See Comments)    Current Outpatient Medications  Medication Instructions   acetaminophen  (TYLENOL ) 500 mg, As needed   albuterol  (VENTOLIN  HFA) 108 (90 Base) MCG/ACT inhaler TAKE 2 PUFFS BY MOUTH EVERY 6 HOURS AS NEEDED FOR WHEEZE OR SHORTNESS OF BREATH   AMBULATORY NON FORMULARY MEDICATION Folding walker with front wheels.  Use as needed. Disp 1 R26.89   Blood Glucose Calibration (ACCU-CHEK GUIDE CONTROL) LIQD 1 Act, In Vitro, 2 times daily PRN   Blood Glucose Monitoring Suppl (ACCU-CHEK GUIDE ME) w/Device KIT 1 Act, Does not apply, 2 times daily PRN   carvedilol  (COREG ) 3.125 mg, Oral, 2 times daily with meals   diclofenac  Sodium (VOLTAREN ) 4 g, Topical, 4 times daily   estradiol (ESTRACE) 0.1 MG/GM vaginal cream 1 Applicatorful, Daily   FLUoxetine  (PROZAC ) 40 mg, Oral, Daily   glucose blood (ACCU-CHEK GUIDE) test strip 1 each, Other, 2 times daily   Jardiance  10 mg, Oral, Daily before breakfast   levothyroxine  (SYNTHROID ) 50 mcg, Oral, Daily   losartan  (COZAAR ) 100 mg, Oral, Daily   memantine  (NAMENDA  XR) 28 mg, Oral, Daily   nitroGLYCERIN  (NITROSTAT ) 0.4 mg, Sublingual, Every 5 min PRN   rosuvastatin  (CRESTOR ) 10 mg,  Oral, Daily   spironolactone  (ALDACTONE ) 25 mg, Oral, Daily     VITALS:   Vitals:   07/23/23 1259 07/23/23 1311  BP: (!) 149/82 (!) 135/90  Pulse: 78   Resp: 20   SpO2: 98%   Weight: 162 lb (73.5 kg)   Height: 4\' 11"  (1.499 m)       PHYSICAL EXAM   HEENT:  Normocephalic, atraumatic. The mucous membranes are moist. The superficial temporal arteries are without ropiness or tenderness. Cardiovascular: Regular rate and rhythm, soft 1 out of 6 systolic murmur. Lungs: Bilateral expiratory rhonchi no wheezing today. Neck: There are no carotid bruits noted bilaterally.  NEUROLOGICAL:    07/23/2023    4:00 PM  Montreal Cognitive Assessment   Visuospatial/ Executive (0/5) 2  Naming (0/3) 2  Attention: Read list of digits (0/2) 2  Attention: Read list of letters (0/1) 1  Attention: Serial 7 subtraction starting at 100 (0/3) 3  Language: Repeat phrase (0/2) 1  Language : Fluency (0/1) 0  Abstraction (0/2) 1  Delayed Recall (0/5) 4  Orientation (0/6) 6  Total 22  Adjusted Score (based on education) 22       11/29/2014    1:20 PM  MMSE - Mini Mental State Exam  Orientation to time 5  Orientation to Place 5  Registration 3  Attention/ Calculation 5  Recall 3  Language- name 2 objects 2  Language- repeat 1  Language- follow 3 step command 3  Language- read & follow direction 1  Write a sentence 1  Copy design 1  Total score 30     Orientation:  Alert and oriented to person, not to place and time (she even gives the appropriate address and ZIP Code). No aphasia or dysarthria. Fund of knowledge is appropriate.  Recent memory impaired, remote memory unremarkable.  Attention and concentration are reduced.  Able to name objects and unable to repeat phrases 1/2. Delayed recall 4/5 Cranial nerves: There is good facial symmetry.  Flat affect, becomes distracted with any visual or auditory stimulation that catches her attention.  Extraocular muscles are intact and visual fields are  full to confrontational testing. Speech is fluent and clear, no tongue deviation. Hearing is intact to conversational tone. Tone: Tone is good throughout. Sensation: Sensation is intact to light touch and pinprick throughout. Vibration is intact at the bilateral big toe. Coordination: The patient has no difficulty with RAM's or FNF bilaterally. Normal finger to nose  Motor: Strength is 5/5 in the bilateral upper and lower extremities. There is no pronator drift. There are no fasciculations noted. DTR's: Deep tendon reflexes are 2/4 .  Plantar responses are downgoing bilaterally. Gait and Station: The patient is able to ambulate with difficulty.  Gait is cautious and narrow.      Thank you for allowing us  the opportunity to participate in the care of this nice patient. Please do not hesitate to contact us  for any questions or concerns.   Total time spent on today's visit was 60 minutes dedicated to this patient today, preparing to see patient, examining the patient, ordering tests and/or medications and counseling the patient, documenting clinical information in the EHR or other health record, independently interpreting results and communicating results to the patient/family, discussing treatment and goals, answering patient's questions and coordinating care.  Cc:  Arcadio Knuckles, MD  [place] 07/23/2023 4:40 PM

## 2023-07-29 ENCOUNTER — Ambulatory Visit: Payer: Self-pay

## 2023-07-29 NOTE — Telephone Encounter (Signed)
 Copied from CRM [id number]. Topic: Clinical - Medical Advice >> Jul 29, 2023  4:14 PM Alyse July wrote: Reason for CRM: Patient called requesting a call back from [facility] pertaining to Fatigue. Contact number: [phone]

## 2023-07-29 NOTE — Telephone Encounter (Signed)
 This RN attempted to contact patient. Answering machine picked up twice but no answer by patient. Will forward to CAL for further follow up due to end of business day.

## 2023-07-30 NOTE — Telephone Encounter (Signed)
 Patient states that she is fatigue. When she gets up she had to sit on the side of the bed for about 10 mins before she can place her feet. She is having issues with her walking due to the fatigue and she doesn't have any energy at all. She Wants to know what she needs to do about this. She also wants to know about her potassium because she was taken off of her potassium medication. She wants to know if there is something she can take for her to have some energy.

## 2023-08-02 ENCOUNTER — Telehealth: Payer: Self-pay | Admitting: Physician Assistant

## 2023-08-02 NOTE — Telephone Encounter (Signed)
 Patient has questions about why she needs this testing with Dr Donavon Fudge. She states that she was told that everything was ok when she saw Abraham Hoffmann. She wants to speak to someone before she schedules with [facility]

## 2023-08-02 NOTE — Telephone Encounter (Signed)
 Called patient and informed her that per Abraham Hoffmann she wants her to have a Neuropsychology for clarity for a formal diagnosis. Patient verbalized understanding and is aware someone from the front will contact her for an appointment.

## 2023-08-05 ENCOUNTER — Telehealth: Payer: Self-pay | Admitting: Internal Medicine

## 2023-08-05 NOTE — Telephone Encounter (Signed)
 Copied from CRM [id number]. Topic: Clinical - Medical Advice >> Aug 05, 2023  3:48 PM Earnestine Goes B wrote: Reason for CRM: pt called requesting to speak with jaz. Please call pt back at [phone]

## 2023-08-06 NOTE — Telephone Encounter (Signed)
 Spoke with the patient and advised her that I did send her message to Dr. Rochelle Chu and I am still waiting on a reply. I also advised her that he is on vacation and the Provider covering his inbasket may respond before he does. She gave a vernal understanding.

## 2023-08-13 DIAGNOSIS — N644 Mastodynia: Secondary | ICD-10-CM | POA: Diagnosis not present

## 2023-09-03 ENCOUNTER — Other Ambulatory Visit (HOSPITAL_BASED_OUTPATIENT_CLINIC_OR_DEPARTMENT_OTHER): Payer: Self-pay

## 2023-09-03 ENCOUNTER — Ambulatory Visit (HOSPITAL_BASED_OUTPATIENT_CLINIC_OR_DEPARTMENT_OTHER): Payer: PPO | Admitting: Family

## 2023-09-03 ENCOUNTER — Other Ambulatory Visit (HOSPITAL_BASED_OUTPATIENT_CLINIC_OR_DEPARTMENT_OTHER)

## 2023-09-03 VITALS — BP 138/80 | HR 70 | Ht 59.0 in | Wt 163.0 lb

## 2023-09-03 DIAGNOSIS — R55 Syncope and collapse: Secondary | ICD-10-CM | POA: Diagnosis not present

## 2023-09-03 DIAGNOSIS — I10 Essential (primary) hypertension: Secondary | ICD-10-CM

## 2023-09-03 DIAGNOSIS — I5032 Chronic diastolic (congestive) heart failure: Secondary | ICD-10-CM | POA: Diagnosis not present

## 2023-09-03 DIAGNOSIS — Z6832 Body mass index (BMI) 32.0-32.9, adult: Secondary | ICD-10-CM | POA: Diagnosis not present

## 2023-09-03 DIAGNOSIS — R5381 Other malaise: Secondary | ICD-10-CM | POA: Diagnosis not present

## 2023-09-03 DIAGNOSIS — I2510 Atherosclerotic heart disease of native coronary artery without angina pectoris: Secondary | ICD-10-CM | POA: Diagnosis not present

## 2023-09-03 MED ORDER — FUROSEMIDE 20 MG PO TABS
ORAL_TABLET | ORAL | 0 refills | Status: DC
  Filled 2023-09-03: qty 3, 3d supply, fill #0

## 2023-09-03 NOTE — Patient Instructions (Addendum)
 Medication Instructions:   START Furosemide 20mg  daily for 3 days to help get rid of excess fluid  *If you need a refill on your cardiac medications before your next appointment, please call your pharmacy*  Testing/Procedures:  Your physician has recommended that you wear a Zio monitor.   This monitor is a medical device that records the heart's electrical activity. Doctors most often use these monitors to diagnose arrhythmias. Arrhythmias are problems with the speed or rhythm of the heartbeat. The monitor is a small device applied to your chest. You can wear one while you do your normal daily activities. While wearing this monitor if you have any symptoms to push the button and record what you felt. Once you have worn this monitor for the period of time provider prescribed (Usually 14 days), you will return the monitor device in the postage paid box. Once it is returned they will download the data collected and provide us  with a report which the provider will then review and we will call you with those results. Important tips:  Avoid showering during the first 24 hours of wearing the monitor. Avoid excessive sweating to help maximize wear time. Do not submerge the device, no hot tubs, and no swimming pools. Keep any lotions or oils away from the patch. After 24 hours you may shower with the patch on. Take brief showers with your back facing the shower head.  Do not remove patch once it has been placed because that will interrupt data and decrease adhesive wear time. Push the button when you have any symptoms and write down what you were feeling. Once you have completed wearing your monitor, remove and place into box which has postage paid and place in your outgoing mailbox.  If for some reason you have misplaced your box then call our office and we can provide another box and/or mail it off for you.  Follow-Up: Your next appointment:   2-3 month(s)  Provider:   Maudine Sos, MD,  Slater Duncan, NP, or Neomi Banks, NP    Other Instructions  We have placed referral to Healthy Weight & Wellness as well as Nutrition & Diabetes.   Recommend checking blood pressure once per day for 2 weeks and keeping a log. We will call in 2 weekst o check in on BP.       Let me know if you want referral to physical therapy International aid/development worker) at [facility].   Exercises to do While Sitting Warm-up Before starting other exercises: Sit up as straight as you can. Have your knees bent at 90 degrees, which is the shape of the capital letter L. Keep your feet flat on the floor. Sit at the front edge of your chair, if you can. Pull in (tighten) the muscles in your abdomen and stretch your spine and neck as straight as you can. Hold this position for a few minutes. Breathe in and out evenly. Try to concentrate on your breathing, and relax your mind.  Stretching Exercise A: Arm stretch Hold your arms out straight in front of your body. Bend your hands at the wrist with your fingers pointing up, as if signaling someone to stop. Notice the slight tension in your forearms as you hold the position. Keeping your arms out and your hands bent, rotate your hands outward as far as you can and hold this stretch. Aim to have your thumbs pointing up and your pinkie fingers pointing down. Slowly repeat arm stretches for one minute as tolerated. Exercise  B: Leg stretch If you can move your legs, try to draw letters on the floor with the toes of your foot. Write your name with one foot. Write your name with the toes of your other foot. Slowly repeat the movements for one minute as tolerated. Exercise C: Reach for the sky Reach your hands as far over your head as you can to stretch your spine. Move your hands and arms as if you are climbing a rope. Slowly repeat the movements for one minute as tolerated.  Range of motion exercises Exercise A: Shoulder roll Let your arms hang loosely at your  sides. Lift just your shoulders up toward your ears, then let them relax back down. When your shoulders feel loose, rotate your shoulders in backward and forward circles. Do shoulder rolls slowly for one minute as tolerated. Exercise B: March in place As if you are marching, pump your arms and lift your legs up and down. Lift your knees as high as you can. If you are unable to lift your knees, just pump your arms and move your ankles and feet up and down. March in place for one minute as tolerated. Exercise C: Seated jumping jacks Let your arms hang down straight. Keeping your arms straight, lift them up over your head. Aim to point your fingers to the ceiling. While you lift your arms, straighten your legs and slide your heels along the floor to your sides, as wide as you can. As you bring your arms back down to your sides, slide your legs back together. If you are unable to use your legs, just move your arms. Slowly repeat seated jumping jacks for one minute as tolerated.  Strengthening exercises Exercise A: Shoulder squeeze Hold your arms straight out from your body to your sides, with your elbows bent and your fists pointed at the ceiling. Keeping your arms in the bent position, move them forward so your elbows and forearms meet in front of your face. Open your arms back out as wide as you can with your elbows still bent, until you feel your shoulder blades squeezing together. Hold for 5 seconds. Slowly repeat the movements forward and backward for one minute as tolerated.

## 2023-09-03 NOTE — Progress Notes (Unsigned)
 Cardiology Office Note:  .   Date:  09/03/2023  ID:  [name], DOB 04-28-1940, MRN [id number] PCP: Arcadio Knuckles, MD  [facility] Providers Cardiologist:  Maudine Sos, MD    History of Present Illness: .   [name] is a 83 y.o. female with a hx of HTN, HLD, Dm2, hypothyroidism, OSA not on CPAP, junctional bradycardia, HFpEF.   Initially seen by Dr. Yancy Heller in 07/2011 for dyspnea at rest with Myoview , PFT unremarkable. RHC 09/2011 normal R heart pressure. Echo 08/2020 LVEF 60-65%, mild LVH, gr1dd, aortic sclerosis without stenosis, trivial MR.   Seen by Dr. Theodis Fiscal 07/2022 with intermittent dyspnea on exertion.  Spironolactone  added for blood pressure control.  Coronary CTA with calcium  score 19.1 placing her in the 50th percentile for age/sex.  Echocardiogram normal LVEF 55 to 60%, no RWMA, grade 1 diastolic dysfunction, aortic sclerosis without stenosis.  Admitted 12/2022 for junctional bradycardia.  The day prior to admission for cardiac CT she took metoprolol  tart to 100 mg, held her carvedilol , took her regular diltiazem .  She went to the ED for her and in the 30s.  Atropine  and IV glucagon  were administered.  Carvedilol  diltiazem  held on discharge.  Losartan  spironolactone  resumed.  She wore live monitor for 4 days with heart rate 64-136 bpm.  Lasting by [name], NP 01/22/2023 doing well from cardiac perspective with improvement in exertional dyspnea and no recurrent bradycardia.  She was on 1 L of O2 since hospital discharge and encouraged to follow-up with pulmonology.  She presents today for follow-up. Pleasant lady who resides at [facility]. She notes since November she has had COVID, RSV, and PNA. Feels her breathing is back to baseline and only dyspnea with more than usual activity. She is waling in her building for exercise with her 2L O2 if she needs.  Monitors BP at home periodically with reassuring readings. ***  Last Saturday while walking to  Bible study in her building (resides in [place]) passed out in the hallway and landed on the floor. She had no prodrome (no palpitations, lightheadedness). She is uncertain how long she lost consciousness for, anticipates 2 minutes. She declined EMS at that time.   She notes over the last 4-6 weeks she will get very weak and feel she has no energy. Not associated with any pain.   Interested in nutrition and weight loss.   Requests referral to healthy weight and wellness.  She has transportation for medical visits on Tuesdays/Thursdays  Weight up 10 pounds from clinic visit 4 months ago.   Does not feel recovered from fall hospitalization then subsequent COVID/RSV.   Reports difficulty falling asleep. She can rest duringt he day but cannot sleep at night.   Ha goal to lose 30 pounds  More memory concern ***   ROS: Please see the history of present illness.    All other systems reviewed and are negative.   Studies Reviewed: .           Risk Assessment/Calculations:             Physical Exam:   VS:  BP 138/80 (BP Location: Right Arm)   Pulse 70   Ht 4' 11 (1.499 m)   Wt 163 lb (73.9 kg)   SpO2 97%   BMI 32.92 kg/m    Wt Readings from Last 3 Encounters:  09/03/23 163 lb (73.9 kg)  07/23/23 162 lb (73.5 kg)  07/02/23 159 lb (72.1 kg)    GEN:  Well nourished, well developed in no acute distress NECK: No JVD; No carotid bruits CARDIAC: RRR, no murmurs, rubs, gallops RESPIRATORY:  Clear to auscultation without rales, wheezing or rhonchi  ABDOMEN: Soft, non-tender, non-distended EXTREMITIES:  No edema; No deformity   ASSESSMENT AND PLAN: .    Junctional bradycardia- No recurrent bradycardia since discontinuation of AV nodal blocking agents (Carvedilol , Diltiazem ).   Mild nonobstructive coronary artery disease/HLD, LDL goal less than 70- Stable with no anginal symptoms. No indication for ischemic evaluation.  GDMT Coreg , Jardiance , Rosuvastatin .   Chronic  diastolic heart failure- Euvolemic and well compensated on exam. GDMT includes Coreg  3.125mg  BID, Jardiance  10mg  daily, Losartan  100mg  daily, Spironolactone  25mg  daily. No indication for loop diuretic.   Hypertension- BP well controlled. Continue current antihypertensive regimen Coreg  3.125mg  BID, Losartan  100mg  daily, Spironolactone  25mg  daily. .Discussed to monitor BP at home at least 2 hours after medications and sitting for 5-10 minutes.   DOE - Stable compared to previous. On home O2. Reports recent COVID, RSV, PNA. Some congestion on expiration but no increased dyspnea nor fever. Encouraged to to schedule overdue follow up with pulmonology.        Dispo: follow up in *** months  Signed, Clearnce Curia, NP

## 2023-09-04 ENCOUNTER — Other Ambulatory Visit (HOSPITAL_BASED_OUTPATIENT_CLINIC_OR_DEPARTMENT_OTHER): Payer: Self-pay

## 2023-09-04 ENCOUNTER — Other Ambulatory Visit (HOSPITAL_BASED_OUTPATIENT_CLINIC_OR_DEPARTMENT_OTHER): Payer: Self-pay | Admitting: Family

## 2023-09-04 ENCOUNTER — Encounter (INDEPENDENT_AMBULATORY_CARE_PROVIDER_SITE_OTHER): Payer: Self-pay

## 2023-09-04 DIAGNOSIS — I5032 Chronic diastolic (congestive) heart failure: Secondary | ICD-10-CM

## 2023-09-07 ENCOUNTER — Encounter (HOSPITAL_BASED_OUTPATIENT_CLINIC_OR_DEPARTMENT_OTHER): Payer: Self-pay | Admitting: Family

## 2023-09-10 ENCOUNTER — Telehealth: Payer: Self-pay | Admitting: Physician Assistant

## 2023-09-10 NOTE — Telephone Encounter (Signed)
 Pt wants to know why she needs this longer testing in December if Camie gave her a good report. She also wants to make sure our records was sent to Debby Molt.

## 2023-09-10 NOTE — Telephone Encounter (Signed)
 Left message to call office to discuss further testing.

## 2023-09-11 NOTE — Telephone Encounter (Signed)
 I advised to keep testing for December 2025.

## 2023-09-16 ENCOUNTER — Other Ambulatory Visit: Payer: Self-pay

## 2023-09-16 ENCOUNTER — Other Ambulatory Visit (HOSPITAL_COMMUNITY): Payer: Self-pay

## 2023-09-16 ENCOUNTER — Other Ambulatory Visit: Payer: Self-pay | Admitting: Internal Medicine

## 2023-09-16 DIAGNOSIS — E118 Type 2 diabetes mellitus with unspecified complications: Secondary | ICD-10-CM

## 2023-09-16 DIAGNOSIS — I10 Essential (primary) hypertension: Secondary | ICD-10-CM

## 2023-09-16 DIAGNOSIS — I5032 Chronic diastolic (congestive) heart failure: Secondary | ICD-10-CM

## 2023-09-17 ENCOUNTER — Other Ambulatory Visit (HOSPITAL_COMMUNITY): Payer: Self-pay

## 2023-09-17 ENCOUNTER — Other Ambulatory Visit: Payer: Self-pay

## 2023-09-17 MED ORDER — LOSARTAN POTASSIUM 100 MG PO TABS
100.0000 mg | ORAL_TABLET | Freq: Every day | ORAL | 0 refills | Status: DC
  Filled 2023-09-17: qty 90, 90d supply, fill #0

## 2023-09-18 ENCOUNTER — Other Ambulatory Visit: Payer: Self-pay

## 2023-09-24 DIAGNOSIS — R55 Syncope and collapse: Secondary | ICD-10-CM | POA: Diagnosis not present

## 2023-09-30 ENCOUNTER — Other Ambulatory Visit: Payer: Self-pay | Admitting: Internal Medicine

## 2023-09-30 DIAGNOSIS — I10 Essential (primary) hypertension: Secondary | ICD-10-CM

## 2023-09-30 DIAGNOSIS — I5032 Chronic diastolic (congestive) heart failure: Secondary | ICD-10-CM

## 2023-10-01 ENCOUNTER — Telehealth: Payer: Self-pay

## 2023-10-01 ENCOUNTER — Ambulatory Visit: Admitting: Internal Medicine

## 2023-10-01 ENCOUNTER — Ambulatory Visit: Admitting: Family Medicine

## 2023-10-01 ENCOUNTER — Ambulatory Visit: Payer: Self-pay

## 2023-10-01 VITALS — BP 120/70 | HR 78 | Temp 98.0°F | Ht 59.0 in | Wt 160.0 lb

## 2023-10-01 VITALS — BP 120/70 | HR 78 | Ht 59.0 in

## 2023-10-01 DIAGNOSIS — M25561 Pain in right knee: Secondary | ICD-10-CM

## 2023-10-01 DIAGNOSIS — F02B0 Dementia in other diseases classified elsewhere, moderate, without behavioral disturbance, psychotic disturbance, mood disturbance, and anxiety: Secondary | ICD-10-CM

## 2023-10-01 DIAGNOSIS — E118 Type 2 diabetes mellitus with unspecified complications: Secondary | ICD-10-CM

## 2023-10-01 DIAGNOSIS — I10 Essential (primary) hypertension: Secondary | ICD-10-CM | POA: Diagnosis not present

## 2023-10-01 DIAGNOSIS — M170 Bilateral primary osteoarthritis of knee: Secondary | ICD-10-CM

## 2023-10-01 DIAGNOSIS — M25562 Pain in left knee: Secondary | ICD-10-CM | POA: Diagnosis not present

## 2023-10-01 DIAGNOSIS — G8929 Other chronic pain: Secondary | ICD-10-CM

## 2023-10-01 DIAGNOSIS — I5032 Chronic diastolic (congestive) heart failure: Secondary | ICD-10-CM

## 2023-10-01 MED ORDER — TRIAMCINOLONE ACETONIDE 32 MG IX SRER
32.0000 mg | Freq: Once | INTRA_ARTICULAR | Status: AC
  Administered 2023-10-01: 32 mg via INTRA_ARTICULAR

## 2023-10-01 NOTE — Progress Notes (Unsigned)
 Subjective:  Patient ID: [name], female    DOB: January 15, 1941  Age: 83 y.o. MRN: [id number]  CC: Hypertension, Diabetes, and Congestive Heart Failure   HPI [name] presents for f/up ----  Discussed the use of AI scribe software for clinical note transcription with the patient, who gave verbal consent to proceed.    Outpatient Medications Prior to Visit  Medication Sig Dispense Refill   acetaminophen  (TYLENOL ) 500 MG tablet Take 500 mg by mouth as needed for mild pain.     albuterol  (VENTOLIN  HFA) 108 (90 Base) MCG/ACT inhaler TAKE 2 PUFFS BY MOUTH EVERY 6 HOURS AS NEEDED FOR WHEEZE OR SHORTNESS OF BREATH 8.5 each 0   AMBULATORY NON FORMULARY MEDICATION Folding walker with front wheels.  Use as needed. Disp 1 R26.89 1 each 0   Blood Glucose Calibration (ACCU-CHEK GUIDE CONTROL) LIQD 1 Act by In Vitro route 2 (two) times daily as needed. 1 each 3   Blood Glucose Monitoring Suppl (ACCU-CHEK GUIDE ME) w/Device KIT 1 Act by Does not apply route 2 (two) times daily as needed. 2 kit 2   carvedilol  (COREG ) 3.125 MG tablet Take 1 tablet (3.125 mg total) by mouth 2 (two) times daily with a meal. 180 tablet 0   clobetasol  ointment (TEMOVATE ) 0.05 % Apply topically as needed (irritation).     diclofenac  Sodium (VOLTAREN ) 1 % GEL Apply 4 g topically 4 (four) times daily. 100 g 5   empagliflozin  (JARDIANCE ) 10 MG TABS tablet Take 1 tablet (10 mg total) by mouth daily before breakfast. 90 tablet 0   estradiol (ESTRACE) 0.1 MG/GM vaginal cream Place 1 Applicatorful vaginally daily.     FLUoxetine  (PROZAC ) 40 MG capsule Take 1 capsule (40 mg total) by mouth daily. 90 capsule 0   furosemide  (LASIX ) 20 MG tablet Take one tablet daily in the morning for 3 days 3 tablet 0   glucose blood (ACCU-CHEK GUIDE) test strip 1 each by Other route 2 (two) times daily. 300 each 1   levothyroxine  (SYNTHROID ) 50 MCG tablet Take 1 tablet (50 mcg total) by mouth daily. 100 tablet 0   losartan  (COZAAR ) 100  MG tablet Take 1 tablet (100 mg total) by mouth daily. 90 tablet 0   memantine  (NAMENDA  XR) 28 MG CP24 24 hr capsule Take 1 capsule (28 mg total) by mouth daily. 90 capsule 1   nitroGLYCERIN  (NITROSTAT ) 0.4 MG SL tablet Place 1 tablet (0.4 mg total) under the tongue every 5 (five) minutes as needed for chest pain. 50 tablet 0   rosuvastatin  (CRESTOR ) 10 MG tablet Take 1 tablet (10 mg total) by mouth daily. 100 tablet 0   spironolactone  (ALDACTONE ) 25 MG tablet Take 1 tablet (25 mg total) by mouth daily. 90 tablet 0   No facility-administered medications prior to visit.    ROS Review of Systems  Constitutional:  Negative for appetite change, chills, diaphoresis, fatigue and fever.  HENT: Negative.    Respiratory: Negative.  Negative for cough, chest tightness, shortness of breath and wheezing.   Cardiovascular:  Negative for chest pain, palpitations and leg swelling.  Gastrointestinal:  Negative for abdominal pain, constipation, diarrhea, nausea and vomiting.  Genitourinary: Negative.  Negative for difficulty urinating.  Musculoskeletal: Negative.   Skin: Negative.   Neurological: Negative.  Negative for dizziness and weakness.  Hematological:  Negative for adenopathy. Does not bruise/bleed easily.  Psychiatric/Behavioral:  Positive for confusion and decreased concentration.     Objective:  BP 120/70 (BP Location: Left Arm, Patient  Position: Sitting, Cuff Size: Large)   Pulse 78   Temp 98 F (36.7 C) (Oral)   Ht 4' 11 (1.499 m)   Wt 160 lb (72.6 kg)   SpO2 96%   BMI 32.32 kg/m   BP Readings from Last 3 Encounters:  10/01/23 120/70  10/01/23 120/70  09/03/23 138/80    Wt Readings from Last 3 Encounters:  10/01/23 160 lb (72.6 kg)  09/03/23 163 lb (73.9 kg)  07/23/23 162 lb (73.5 kg)    Physical Exam Vitals reviewed.  Constitutional:      Appearance: Normal appearance.  HENT:     Mouth/Throat:     Mouth: Mucous membranes are moist.  Eyes:     General: No scleral  icterus.    Conjunctiva/sclera: Conjunctivae normal.  Cardiovascular:     Rate and Rhythm: Normal rate and regular rhythm.     Heart sounds: No murmur heard.    No friction rub. No gallop.  Pulmonary:     Effort: Pulmonary effort is normal.     Breath sounds: No stridor. No wheezing, rhonchi or rales.  Abdominal:     General: Abdomen is protuberant.     Palpations: There is no mass.     Tenderness: There is no abdominal tenderness. There is no guarding.     Hernia: No hernia is present.  Musculoskeletal:        General: Normal range of motion.     Cervical back: Neck supple.     Right lower leg: No edema.     Left lower leg: No edema.  Lymphadenopathy:     Cervical: No cervical adenopathy.  Skin:    General: Skin is warm and dry.  Neurological:     General: No focal deficit present.     Mental Status: She is alert. Mental status is at baseline.     Lab Results  Component Value Date   WBC 9.5 03/12/2023   HGB 14.4 03/12/2023   HCT 43.7 03/12/2023   PLT 256.0 03/12/2023   GLUCOSE 101 (H) 07/02/2023   CHOL 272 (H) 11/29/2022   TRIG 97.0 11/29/2022   HDL 108.20 11/29/2022   LDLDIRECT 120.6 11/24/2012   LDLCALC 145 (H) 11/29/2022   ALT 24 03/12/2023   AST 17 03/12/2023   NA 141 07/02/2023   K 3.9 07/02/2023   CL 107 07/02/2023   CREATININE 0.80 07/02/2023   BUN 16 07/02/2023   CO2 27 07/02/2023   TSH 2.34 07/02/2023   INR 1.0 11/20/2021   HGBA1C 6.2 07/02/2023   MICROALBUR 1.6 07/02/2023    MR Brain Wo Contrast Result Date: 07/23/2023 CLINICAL DATA:  Headache, new onset (Age >= 51y).  Memory loss. EXAM: MRI HEAD WITHOUT CONTRAST TECHNIQUE: Multiplanar, multiecho pulse sequences of the brain and surrounding structures were obtained without intravenous contrast. COMPARISON:  Head CT 11/20/2021 and MRI 10/31/2020 FINDINGS: Brain: There is no evidence of an acute infarct, intracranial hemorrhage, mass, midline shift, or extra-axial fluid collection. T2 hyperintensities  in the cerebral white matter bilaterally have mildly progressed and are nonspecific but compatible with mild chronic small vessel ischemic disease. Mild to moderate cerebral atrophy is unchanged and most notable in the right greater than left perisylvian regions. Vascular: Major intracranial vascular flow voids are preserved. Skull and upper cervical spine: Unremarkable bone marrow signal. Sinuses/Orbits: Bilateral cataract extraction. Paranasal sinuses and mastoid air cells are clear. Other: None. IMPRESSION: 1. No acute intracranial abnormality. 2. Mild chronic small vessel ischemic disease and mild to moderate  cerebral atrophy. Electronically Signed   By: Dasie Hamburg M.D.   On: 07/23/2023 13:04    Assessment & Plan:  There are no diagnoses linked to this encounter.   Follow-up: No follow-ups on file.  Debby Molt, MD

## 2023-10-01 NOTE — Progress Notes (Signed)
 I, [name] am a scribe for Dr. Artist Lloyd, MD.  [name] is a 83 y.o. female who presents to [facility] at [facility] today for exacerbation of her bilateral knee pain. Patient was last seen by Dr. Lloyd on 05/14/23 and was given bilat Zilretta  injections.  Today, pt reports it is time for her shots. The left knee is really giving her an issue today. Wants to know if her knees are bone on bone.   Dx imaging: 08/08/20 R knee XR             01/16/20 L knee XR  Pertinent review of systems: No fevers or chills  Relevant historical information: Hypertension and diabetes   Exam:  BP 120/70   Pulse 78   Ht 4' 11 (1.499 m)   SpO2 96%   BMI 32.92 kg/m  General: Well Developed, well nourished, and in no acute distress.   MSK: Knees bilaterally mild effusion normal-appearing otherwise normal motion.    Lab and Radiology Results   Zilretta  injection bilateral knee Procedure: Real-time Ultrasound Guided Injection of left knee joint superior lateral patellar space Device: Philips Affiniti 50G Images permanently stored and available for review in PACS Verbal informed consent obtained.  Discussed risks and benefits of procedure. Warned about infection, hyperglycemia bleeding, damage to structures among others. Patient expresses understanding and agreement Time-out conducted.   Noted no overlying erythema, induration, or other signs of local infection.   Skin prepped in a sterile fashion.   Local anesthesia: Topical Ethyl chloride.   With sterile technique and under real time ultrasound guidance: Zilretta  32 mg injected into knee joint. Fluid seen entering the joint capsule.   Completed without difficulty   Advised to call if fevers/chills, erythema, induration, drainage, or persistent bleeding.   Images permanently stored and available for review in the ultrasound unit.  Impression: Technically successful ultrasound guided injection.   Procedure:  Real-time Ultrasound Guided Injection of right knee joint superior lateral patellar space Device: Philips Affiniti 50G Images permanently stored and available for review in PACS Verbal informed consent obtained.  Discussed risks and benefits of procedure. Warned about infection, hyperglycemia bleeding, damage to structures among others. Patient expresses understanding and agreement Time-out conducted.   Noted no overlying erythema, induration, or other signs of local infection.   Skin prepped in a sterile fashion.   Local anesthesia: Topical Ethyl chloride.   With sterile technique and under real time ultrasound guidance: Zilretta  32 mg injected into knee joint. Fluid seen entering the joint capsule.   Completed without difficulty   Advised to call if fevers/chills, erythema, induration, drainage, or persistent bleeding.   Images permanently stored and available for review in the ultrasound unit.  Impression: Technically successful ultrasound guided injection.  Lot number: [id number]     Assessment and Plan: 83 y.o. female with bilateral knee pain due to DJD.  We do not have updated x-rays so it is hard to know the status of her knee arthritis.  We could check but decided against getting new x-rays today.  Previous already injection lasted almost 5 months.  Plan to recheck when her pain returns and anticipate proceeding with Zilretta  injection at that time.  We can do this injection as frequently as every 3 months.   PDMP not reviewed this encounter. Orders Placed This Encounter  Procedures   US  LIMITED JOINT SPACE STRUCTURES LOW BILAT(NO LINKED CHARGES)    Reason for Exam (SYMPTOM  OR DIAGNOSIS REQUIRED):   bilateral  knee pain    Preferred imaging location?:   [facility] ordered this encounter  Medications   Triamcinolone  Acetonide (ZILRETTA ) intra-articular injection 32 mg   Triamcinolone  Acetonide (ZILRETTA ) intra-articular injection 32 mg      Discussed warning signs or symptoms. Please see discharge instructions. Patient expresses understanding.   The above documentation has been reviewed and is accurate and complete Artist Lloyd, M.D.

## 2023-10-01 NOTE — Patient Instructions (Signed)
 Thank you for coming in today.   You received an injection today. Seek immediate medical attention if the joint becomes red, extremely painful, or is oozing fluid.

## 2023-10-01 NOTE — Telephone Encounter (Signed)
 Patient re-ran for Zilretta  10/01/23. Case number: [id number].

## 2023-10-02 ENCOUNTER — Encounter: Payer: Self-pay | Admitting: Internal Medicine

## 2023-10-02 DIAGNOSIS — R55 Syncope and collapse: Secondary | ICD-10-CM

## 2023-10-02 NOTE — Patient Instructions (Signed)

## 2023-10-02 NOTE — Telephone Encounter (Signed)
 Medication needs to be ordered. Last injection was 10/01/23.  Zilretta  for bilateral knees approved.  No prior authorization needed. Medical notes must be submitted with the claim. Include medical necessity, diagnosis and all clinicals. A referral is required from PCP Dr.Thomas Jones. This must be submitted with the claim. Patient has a Medicare Advantage HMO plan with an effective date of 03/20/2023. Plan follows Medicare guidelines. Patient responsibility for [phone] (Zilretta ) will be 20% with the remaining covered at 80% by the payer at the contracted rate. CPT code 79388 is covered at 100% by the payer at the contracted rate with no patient responsibility. Deductibles do not apply to these services. Specialist office visits if billed are covered after a $15 copay. Patient has an out of pocket maximum of $2500 and has accumulated $181.90. If out of pocket is met, coverage goes to 100% and copay will no longer apply.  Case ID: [id number] Exp: 04/03/2024

## 2023-10-02 NOTE — Telephone Encounter (Signed)
 Noted.  Scheduled 10/14.

## 2023-10-03 ENCOUNTER — Ambulatory Visit (HOSPITAL_BASED_OUTPATIENT_CLINIC_OR_DEPARTMENT_OTHER): Payer: Self-pay | Admitting: Family

## 2023-10-08 ENCOUNTER — Other Ambulatory Visit (HOSPITAL_COMMUNITY): Payer: Self-pay

## 2023-10-08 MED ORDER — CARVEDILOL 3.125 MG PO TABS
3.1250 mg | ORAL_TABLET | Freq: Two times a day (BID) | ORAL | 0 refills | Status: DC
Start: 2023-10-08 — End: 2023-11-01
  Filled 2023-10-08: qty 180, 90d supply, fill #0

## 2023-10-15 ENCOUNTER — Telehealth: Payer: Self-pay

## 2023-10-15 NOTE — Telephone Encounter (Signed)
 Order has been faxed back to [facility].

## 2023-10-22 ENCOUNTER — Other Ambulatory Visit: Payer: Self-pay | Admitting: Internal Medicine

## 2023-10-22 ENCOUNTER — Other Ambulatory Visit (HOSPITAL_COMMUNITY): Payer: Self-pay

## 2023-10-22 DIAGNOSIS — F3342 Major depressive disorder, recurrent, in full remission: Secondary | ICD-10-CM

## 2023-10-23 ENCOUNTER — Other Ambulatory Visit (HOSPITAL_COMMUNITY): Payer: Self-pay

## 2023-10-23 ENCOUNTER — Other Ambulatory Visit: Payer: Self-pay

## 2023-10-23 ENCOUNTER — Other Ambulatory Visit: Payer: Self-pay | Admitting: Internal Medicine

## 2023-10-23 DIAGNOSIS — I5032 Chronic diastolic (congestive) heart failure: Secondary | ICD-10-CM

## 2023-10-23 MED ORDER — FLUOXETINE HCL 40 MG PO CAPS
40.0000 mg | ORAL_CAPSULE | Freq: Every day | ORAL | 0 refills | Status: DC
  Filled 2023-10-23: qty 90, 90d supply, fill #0

## 2023-10-23 NOTE — Telephone Encounter (Signed)
 Last OV 10/01/23 Next OV 12/31/23  Last refill 07/03/23 Qty #90/0

## 2023-10-24 MED ORDER — NITROGLYCERIN 0.4 MG SL SUBL
0.4000 mg | SUBLINGUAL_TABLET | SUBLINGUAL | 0 refills | Status: DC | PRN
  Filled 2024-02-01: qty 50, 18d supply, fill #0

## 2023-10-24 NOTE — Telephone Encounter (Signed)
 Last OV 10/01/23 Next OV 12/31/23  Last refill 04/10/23 Qty #50/0

## 2023-10-25 ENCOUNTER — Other Ambulatory Visit (HOSPITAL_COMMUNITY): Payer: Self-pay

## 2023-10-29 ENCOUNTER — Telehealth: Payer: Self-pay

## 2023-10-29 NOTE — Telephone Encounter (Signed)
 Spoke with patient regarding order for Concentrator from [facility] . Per Dr.Olalere patient has been scheduled for a f/u 11/26/2023.Patient's voice was understanding.Noting else further needed.

## 2023-11-01 ENCOUNTER — Other Ambulatory Visit (HOSPITAL_COMMUNITY): Payer: Self-pay

## 2023-11-01 ENCOUNTER — Other Ambulatory Visit: Payer: Self-pay | Admitting: Internal Medicine

## 2023-11-01 DIAGNOSIS — E118 Type 2 diabetes mellitus with unspecified complications: Secondary | ICD-10-CM

## 2023-11-01 DIAGNOSIS — E039 Hypothyroidism, unspecified: Secondary | ICD-10-CM

## 2023-11-01 DIAGNOSIS — I10 Essential (primary) hypertension: Secondary | ICD-10-CM

## 2023-11-01 DIAGNOSIS — F3342 Major depressive disorder, recurrent, in full remission: Secondary | ICD-10-CM

## 2023-11-01 DIAGNOSIS — I5032 Chronic diastolic (congestive) heart failure: Secondary | ICD-10-CM

## 2023-11-01 DIAGNOSIS — E785 Hyperlipidemia, unspecified: Secondary | ICD-10-CM

## 2023-11-06 ENCOUNTER — Other Ambulatory Visit: Payer: Self-pay

## 2023-11-06 ENCOUNTER — Other Ambulatory Visit (HOSPITAL_COMMUNITY): Payer: Self-pay

## 2023-11-06 MED ORDER — LOSARTAN POTASSIUM 100 MG PO TABS
100.0000 mg | ORAL_TABLET | Freq: Every day | ORAL | 0 refills | Status: AC
Start: 2023-11-06 — End: ?
  Filled 2023-11-06 – 2024-01-03 (×2): qty 90, 90d supply, fill #0

## 2023-11-06 MED ORDER — CARVEDILOL 3.125 MG PO TABS
3.1250 mg | ORAL_TABLET | Freq: Two times a day (BID) | ORAL | 0 refills | Status: DC
  Filled 2023-11-06 – 2023-12-30 (×2): qty 180, 90d supply, fill #0

## 2023-11-06 MED ORDER — FLUOXETINE HCL 40 MG PO CAPS
40.0000 mg | ORAL_CAPSULE | Freq: Every day | ORAL | 0 refills | Status: AC
  Filled 2023-11-06 – 2024-02-01 (×2): qty 90, 90d supply, fill #0

## 2023-11-06 MED ORDER — LEVOTHYROXINE SODIUM 50 MCG PO TABS
50.0000 ug | ORAL_TABLET | Freq: Every day | ORAL | 0 refills | Status: AC
Start: 2023-11-06 — End: ?
  Filled 2023-11-06: qty 100, 100d supply, fill #0

## 2023-11-06 MED ORDER — ROSUVASTATIN CALCIUM 10 MG PO TABS
10.0000 mg | ORAL_TABLET | Freq: Every day | ORAL | 0 refills | Status: AC
  Filled 2023-11-06: qty 100, 100d supply, fill #0

## 2023-11-06 MED ORDER — EMPAGLIFLOZIN 10 MG PO TABS
10.0000 mg | ORAL_TABLET | Freq: Every day | ORAL | 0 refills | Status: DC
  Filled 2023-11-06: qty 90, 90d supply, fill #0

## 2023-11-06 MED ORDER — SPIRONOLACTONE 25 MG PO TABS
25.0000 mg | ORAL_TABLET | Freq: Every day | ORAL | 0 refills | Status: DC
  Filled 2023-11-06: qty 90, 90d supply, fill #0

## 2023-11-11 NOTE — Progress Notes (Deleted)
 Office: [phone]  /  Fax: [phone]   Initial Visit    [name] was seen in clinic today to evaluate for obesity. [name] is interested in losing weight to improve overall health and reduce the risk of weight related complications. [name] presents today to review program treatment options, initial physical assessment, and evaluation.     [name] was referred by: Specialist- Cardiology  When asked what else they would like to accomplish? [name] states: {EMHopetoaccomplish:28304::Adopt a healthier eating pattern and lifestyle,Improve energy levels and physical activity,Improve existing medical conditions,Improve quality of life}  When asked how has your weight affected you? [name] states: {EMWeightAffected:28305}  Weight history: ***  Highest weight: ***  Some associated conditions: {EMSomeConditions:28306}  Contributing factors: {EMcontributingfactors:28307}  Weight promoting medications identified: {EMWeightpromotingrx:28308}  Prior weight loss attempts: {emweightlossprograms:31590::None}  Current nutrition plan: {EMNutritionplan:28309::None}  Current level of physical activity: {EMcurrentPA:28310::None}  Current or previous pharmacotherapy: {EM previousRx:28311}  Response to medication: {EMResponsetomedication:28312}   Past medical history includes:   Past Medical History:  Diagnosis Date   Allergic rhinitis    Anxiety    Arthritis    Chronic cough    Dr Darlean, onset 11/2007, Sinus CT July 7,2010>neg, Resolved 10/2008 on ppi two times a day   Colon polyp    Depression    Diabetes mellitus type II    diet   Dyslipidemia    Hiatal hernia    Hypertension    Hypothyroidism    Lung nodule    Obesity (BMI 30-39.9)    Pneumonia    Sleep apnea    Dr Corrie CPAP 2009     Objective    There were no vitals taken for this visit. [name] was weighed on the bioimpedance scale: There is no height or weight on file to calculate BMI.  Body Fat%:***, Visceral Fat  Rating:***, Weight trend over the last 12 months: {emweighttrend:28333}  General:  Alert, oriented and cooperative. Patient is in no acute distress.  Respiratory: Normal respiratory effort, no problems with respiration noted   Gait: able to ambulate independently  Mental Status: Normal mood and affect. Normal behavior. Normal judgment and thought content.   DIAGNOSTIC DATA REVIEWED:  BMET    Component Value Date/Time   NA 141 07/02/2023 1357   K 3.9 07/02/2023 1357   CL 107 07/02/2023 1357   CO2 27 07/02/2023 1357   GLUCOSE 101 (H) 07/02/2023 1357   BUN 16 07/02/2023 1357   CREATININE 0.80 07/02/2023 1357   CALCIUM  9.1 07/02/2023 1357   GFRNONAA >60 02/24/2023 1547   GFRAA  06/28/2009 0500    >60        The eGFR has been calculated using the MDRD equation. This calculation has not been validated in all clinical situations. eGFR's persistently <60 mL/min signify possible Chronic Kidney Disease.   Lab Results  Component Value Date   HGBA1C 6.2 07/02/2023   HGBA1C 6.3 (H) 02/19/2007   No results found for: INSULIN  CBC    Component Value Date/Time   WBC 9.5 03/12/2023 1118   RBC 4.69 03/12/2023 1118   HGB 14.4 03/12/2023 1118   HCT 43.7 03/12/2023 1118   PLT 256.0 03/12/2023 1118   MCV 93.3 03/12/2023 1118   MCH 30.0 02/24/2023 1547   MCHC 33.0 03/12/2023 1118   RDW 16.8 (H) 03/12/2023 1118   Iron/TIBC/Ferritin/ %Sat    Component Value Date/Time   IRON 70 05/22/2021 1420   TIBC 338.8 05/22/2021 1420   FERRITIN 25.6 05/22/2021 1420   IRONPCTSAT 20.7 05/22/2021  1420   Lipid Panel     Component Value Date/Time   CHOL 272 (H) 11/29/2022 1349   TRIG 97.0 11/29/2022 1349   HDL 108.20 11/29/2022 1349   CHOLHDL 3 11/29/2022 1349   VLDL 19.4 11/29/2022 1349   LDLCALC 145 (H) 11/29/2022 1349   LDLDIRECT 120.6 11/24/2012 1710   Hepatic Function Panel     Component Value Date/Time   PROT 7.4 03/12/2023 1118   ALBUMIN 4.1 03/12/2023 1118   AST 17 03/12/2023  1118   ALT 24 03/12/2023 1118   ALKPHOS 71 03/12/2023 1118   BILITOT 0.7 03/12/2023 1118   BILIDIR 0.1 03/12/2023 1118      Component Value Date/Time   TSH 2.34 07/02/2023 1357     Assessment and Plan   Type II diabetes mellitus with manifestations (HCC)  Acquired hypothyroidism  OBSTRUCTIVE SLEEP APNEA  Gastroesophageal reflux disease, unspecified whether esophagitis present  GAD (generalized anxiety disorder)  Hyperlipidemia with target LDL less than 100  Recurrent major depressive disorder, in full remission (HCC)  Chronic pain of both knees  Psychophysiological insomnia  Chronic heart failure with preserved ejection fraction (HCC)  OAB (overactive bladder)  Moderate late onset Alzheimer's dementia without behavioral disturbance, psychotic disturbance, mood disturbance, or anxiety (HCC)  Chronic hypoxemic respiratory failure (HCC)    Assessment and Plan Assessment & Plan         Obesity Treatment / Action Plan:  {EMobesityactionplanscribe:28314::Patient will work on garnering support from family and friends to begin weight loss journey.,Will work on eliminating or reducing the presence of highly palatable, calorie dense foods in the home.,Will complete provided nutritional and psychosocial assessment questionnaire before the next appointment.,Will be scheduled for indirect calorimetry to determine resting energy expenditure in a fasting state.  This will allow us  to create a reduced calorie, high-protein meal plan to promote loss of fat mass while preserving muscle mass.,Counseled on the health benefits of losing 5%-15% of total body weight.,Was counseled on nutritional approaches to weight loss and benefits of reducing processed foods and consuming plant-based foods and high quality protein as part of nutritional weight management.,Was counseled on pharmacotherapy and role as an adjunct in weight management. }  Obesity Education Performed  Today:  [name] was weighed on the bioimpedance scale and results were discussed and documented in the synopsis.  We discussed obesity as a disease and the importance of a more detailed evaluation of all the factors contributing to the disease.  We discussed the importance of long term lifestyle changes which include nutrition, exercise and behavioral modifications as well as the importance of customizing this to her specific health and social needs.  We discussed the benefits of reaching a healthier weight to alleviate the symptoms of existing conditions and reduce the risks of the biomechanical, metabolic and psychological effects of obesity.  We reviewed the four pillars of obesity medicine and importance of using a multimodal approach.  We reviewed the basic principles in weight management.   [name] appears to be in the action stage of change and states they are ready to start intensive lifestyle modifications and behavioral modifications.  I have spent *** minutes in the care of the patient today including: {NUMBER 1-10:22536} minutes before the visit reviewing and preparing the chart. *** minutes face-to-face {emfacetoface:32598::assessing and reviewing listed medical problems as outlined in obesity care plan,providing nutritional and behavioral counseling on topics outlined in the obesity care plan,independently interpreting test results and goals of care, as described in assessment and plan,reviewing and discussing  biometric information and progress} {NUMBER 1-10:22536} minutes after the visit updating chart and documentation of encounter.  Reviewed by clinician on day of visit: allergies, medications, problem list, medical history, surgical history, family history, social history, and previous encounter notes pertinent to obesity diagnosis.   Lucas Parker, MD

## 2023-11-12 ENCOUNTER — Institutional Professional Consult (permissible substitution) (INDEPENDENT_AMBULATORY_CARE_PROVIDER_SITE_OTHER): Admitting: Physician Assistant

## 2023-11-12 DIAGNOSIS — E039 Hypothyroidism, unspecified: Secondary | ICD-10-CM

## 2023-11-12 DIAGNOSIS — G4733 Obstructive sleep apnea (adult) (pediatric): Secondary | ICD-10-CM

## 2023-11-12 DIAGNOSIS — E118 Type 2 diabetes mellitus with unspecified complications: Secondary | ICD-10-CM

## 2023-11-12 DIAGNOSIS — N3281 Overactive bladder: Secondary | ICD-10-CM

## 2023-11-12 DIAGNOSIS — F411 Generalized anxiety disorder: Secondary | ICD-10-CM

## 2023-11-12 DIAGNOSIS — J9611 Chronic respiratory failure with hypoxia: Secondary | ICD-10-CM

## 2023-11-12 DIAGNOSIS — E785 Hyperlipidemia, unspecified: Secondary | ICD-10-CM

## 2023-11-12 DIAGNOSIS — K219 Gastro-esophageal reflux disease without esophagitis: Secondary | ICD-10-CM

## 2023-11-12 DIAGNOSIS — F3342 Major depressive disorder, recurrent, in full remission: Secondary | ICD-10-CM

## 2023-11-12 DIAGNOSIS — F5104 Psychophysiologic insomnia: Secondary | ICD-10-CM

## 2023-11-12 DIAGNOSIS — G8929 Other chronic pain: Secondary | ICD-10-CM

## 2023-11-12 DIAGNOSIS — F02B0 Dementia in other diseases classified elsewhere, moderate, without behavioral disturbance, psychotic disturbance, mood disturbance, and anxiety: Secondary | ICD-10-CM

## 2023-11-12 DIAGNOSIS — I5032 Chronic diastolic (congestive) heart failure: Secondary | ICD-10-CM

## 2023-11-26 ENCOUNTER — Ambulatory Visit: Admitting: Pulmonary Disease

## 2023-11-28 ENCOUNTER — Ambulatory Visit (INDEPENDENT_AMBULATORY_CARE_PROVIDER_SITE_OTHER): Admitting: Nurse Practitioner

## 2023-11-28 ENCOUNTER — Institutional Professional Consult (permissible substitution) (INDEPENDENT_AMBULATORY_CARE_PROVIDER_SITE_OTHER): Admitting: Nurse Practitioner

## 2023-11-28 ENCOUNTER — Encounter (INDEPENDENT_AMBULATORY_CARE_PROVIDER_SITE_OTHER): Payer: Self-pay | Admitting: Nurse Practitioner

## 2023-11-28 VITALS — BP 153/83 | HR 68 | Temp 97.6°F | Ht <= 58 in | Wt 156.0 lb

## 2023-11-28 DIAGNOSIS — E1169 Type 2 diabetes mellitus with other specified complication: Secondary | ICD-10-CM

## 2023-11-28 DIAGNOSIS — I10 Essential (primary) hypertension: Secondary | ICD-10-CM

## 2023-11-28 DIAGNOSIS — E1122 Type 2 diabetes mellitus with diabetic chronic kidney disease: Secondary | ICD-10-CM

## 2023-11-28 DIAGNOSIS — N182 Chronic kidney disease, stage 2 (mild): Secondary | ICD-10-CM | POA: Diagnosis not present

## 2023-11-28 DIAGNOSIS — Z6832 Body mass index (BMI) 32.0-32.9, adult: Secondary | ICD-10-CM | POA: Diagnosis not present

## 2023-11-28 DIAGNOSIS — G301 Alzheimer's disease with late onset: Secondary | ICD-10-CM | POA: Diagnosis not present

## 2023-11-28 DIAGNOSIS — F02B0 Dementia in other diseases classified elsewhere, moderate, without behavioral disturbance, psychotic disturbance, mood disturbance, and anxiety: Secondary | ICD-10-CM | POA: Diagnosis not present

## 2023-11-28 DIAGNOSIS — E66811 Obesity, class 1: Secondary | ICD-10-CM | POA: Diagnosis not present

## 2023-11-28 DIAGNOSIS — I130 Hypertensive heart and chronic kidney disease with heart failure and stage 1 through stage 4 chronic kidney disease, or unspecified chronic kidney disease: Secondary | ICD-10-CM | POA: Diagnosis not present

## 2023-11-28 DIAGNOSIS — E039 Hypothyroidism, unspecified: Secondary | ICD-10-CM | POA: Diagnosis not present

## 2023-11-28 DIAGNOSIS — I5032 Chronic diastolic (congestive) heart failure: Secondary | ICD-10-CM

## 2023-11-28 DIAGNOSIS — E7800 Pure hypercholesterolemia, unspecified: Secondary | ICD-10-CM | POA: Diagnosis not present

## 2023-11-28 DIAGNOSIS — Z0289 Encounter for other administrative examinations: Secondary | ICD-10-CM

## 2023-11-28 NOTE — Progress Notes (Addendum)
 Office: [phone]  /  Fax: [phone]   Initial Visit    [name] was seen in clinic today to evaluate for obesity. She is interested in losing weight to improve overall health and reduce the risk of weight related complications. She presents today to review program treatment options, initial physical assessment, and evaluation.      [name] lives at [facility]. She is followed by cardiology Reche Finder NP with last visit 09/03/2023, she referred Arlene to our clinic for medical weight loss management: Junctional bradycardia- No recurrent bradycardia since discontinuation of Diltiazem . Tolerating low dose Coreg  3.125mg  BID. Would not further increase dose. Monitor with periodic EKG.   Mild nonobstructive coronary artery disease/HLD, LDL goal less than 70- Stable with no anginal symptoms. No indication for ischemic evaluation.  GDMT Coreg  3.125mg  BID, Jardiance  10mg  dialy, Rosuvastatin  10mg  daily.   Chronic diastolic heart failure- Weight up 10 lbs over 4 months, concern for volume overload. Rx Lasix  20mg  daily x 3 days.SABRA  GDMT includes Coreg  3.125mg  BID, Jardiance  10mg  daily, Losartan  100mg  daily, Spironolactone  25mg  daily.   Hypertension- BP mildly elevated in setting of volume overload. 3 day course Lasix , as above. Continue current antihypertensive regimen Coreg  3.125mg  BID, Losartan  100mg  daily, Spironolactone  25mg  daily. .Discussed to monitor BP at home at least 2 hours after medications and sitting for 5-10 minutes.   Syncope - Episode 2 weeks ago with no prodrome. She did not seek EMS or ED evaluation. 14 day ZIO placed in clinic to rule out arrhythmia as contributory.  BMI 32 - Goal to lose 30 pounds. Referral placed to [facility] per her request.  Physical deconditioning - politely declines referral to PT at her ALF. She will contact us  if interested. Provided handout on seated exercises to increase conditioning.   Memory impairment - concern for worsening  memory impairment compared to prior based on discussion. Will route to neurology and PCP for their input. She is on Namernda XR 28mg  QD She did have a zio monitor for 14 days which showed mostly NSR but there were 17 episodes of SVT- she was advised to continue carvedilol  3.125 mg BID She also has hypothyroidism and is currently managed with Levothyroxine  50 mcg every day- therapeutic on this dose. She has Type 2 DM currently managed with jardiance  10 mg every day - last A1c showed prediabetic range, A1c has been as high as 6.8. Last GFR 67.82- stage 2 CKD.  Lab Results  Component Value Date   HGBA1C 6.2 07/02/2023    [name] noticed weight gain after the birth of her children.  Did weight watchers in 1970 and lost 18 pounds.  She has noticed gradual increase in weight throughout her adulthood.   She was referred by: Specialist- Cardiology  When asked what else they would like to accomplish? She states: Adopt a healthier eating pattern and lifestyle, Improve energy levels and physical activity, Improve existing medical conditions, Improve quality of life, and Improve appearance  When asked how has your weight affected you? She states: Contributed to medical problems, Contributed to orthopedic problems or mobility issues, Having fatigue, and Having poor endurance  Weight history: As an adult  Highest weight: 180  Some associated conditions: Hypertension, Arthritis:both knees, Hyperlipidemia, OSA, Diabetes, ASCVD, Heart Failure, and Vitamin D Deficiency  Contributing factors: disruption of circadian rhythm / sleep disordered breathing, use of obesogenic medications: Beta-blockers, Psychotropic medications, and Contraceptives or hormonal therapy, chronic skipping of meals, mental health problems, menopause, and multiple weight loss  attempts in the past  Weight promoting medications identified: Beta-blockers, Psychotropic medications, Anticholinergics, and Contraceptives or hormonal  therapy  Prior weight loss attempts: Weight Watchers- lost 18 pounds 1970  Current nutrition plan: None  Current level of physical activity: None- plans to use rollator to walk the halls.   Current or previous pharmacotherapy: Phentermine  Response to medication: Had side effects so it was discontinued   Past medical history includes:   Past Medical History:  Diagnosis Date   Allergic rhinitis    Anxiety    Arthritis    Chronic cough    Dr Darlean, onset 11/2007, Sinus CT July 7,2010>neg, Resolved 10/2008 on ppi two times a day   Colon polyp    Depression    Diabetes mellitus type II    diet   Dyslipidemia    Hiatal hernia    Hypertension    Hypothyroidism    Lung nodule    Obesity (BMI 30-39.9)    Pneumonia    Sleep apnea    Dr Corrie CPAP 2009     Objective    BP (!) 153/83   Pulse 68   Temp 97.6 F (36.4 C)   Ht 4' 9 (1.448 m)   Wt 156 lb (70.8 kg)   SpO2 95%   BMI 33.76 kg/m  She was weighed on the bioimpedance scale: Body mass index is 33.76 kg/m.  Body Fat%:50.9, Visceral Fat Rating:17,   General:  Alert, oriented and cooperative. Patient is in no acute distress.  Respiratory: Normal respiratory effort, no problems with respiration noted                              Gait: able to ambulate independently  Mental Status: Normal mood and affect. Normal behavior. Normal judgment and thought content.   DIAGNOSTIC DATA REVIEWED:  Last metabolic panel Lab Results  Component Value Date   GLUCOSE 101 (H) 07/02/2023   NA 141 07/02/2023   K 3.9 07/02/2023   CL 107 07/02/2023   CO2 27 07/02/2023   BUN 16 07/02/2023   CREATININE 0.80 07/02/2023   GFR 67.82 07/02/2023   CALCIUM  9.1 07/02/2023   PROT 7.4 03/12/2023   ALBUMIN 4.1 03/12/2023   BILITOT 0.7 03/12/2023   ALKPHOS 71 03/12/2023   AST 17 03/12/2023   ALT 24 03/12/2023   ANIONGAP 9 02/24/2023     Lab Results  Component Value Date   HGBA1C 6.2 07/02/2023   HGBA1C 6.3 (H) 02/19/2007   No  results found for: INSULIN  CBC    Component Value Date/Time   WBC 9.5 03/12/2023 1118   RBC 4.69 03/12/2023 1118   HGB 14.4 03/12/2023 1118   HCT 43.7 03/12/2023 1118   PLT 256.0 03/12/2023 1118   MCV 93.3 03/12/2023 1118   MCH 30.0 02/24/2023 1547   MCHC 33.0 03/12/2023 1118   RDW 16.8 (H) 03/12/2023 1118   Iron/TIBC/Ferritin/ %Sat    Component Value Date/Time   IRON 70 05/22/2021 1420   TIBC 338.8 05/22/2021 1420   FERRITIN 25.6 05/22/2021 1420   IRONPCTSAT 20.7 05/22/2021 1420   Lipid Panel     Component Value Date/Time   CHOL 272 (H) 11/29/2022 1349   TRIG 97.0 11/29/2022 1349   HDL 108.20 11/29/2022 1349   CHOLHDL 3 11/29/2022 1349   VLDL 19.4 11/29/2022 1349   LDLCALC 145 (H) 11/29/2022 1349   LDLDIRECT 120.6 11/24/2012 1710  Component Value Date/Time   TSH 2.34 07/02/2023 1357     Assessment and Plan   Essential hypertension  Chronic heart failure with preserved ejection fraction (HCC)  Acquired hypothyroidism  Type 2 diabetes mellitus with stage 2 chronic kidney disease, without long-term current use of insulin  (HCC)  Moderate late onset Alzheimer's dementia without behavioral disturbance, psychotic disturbance, mood disturbance, or anxiety (HCC)  Type 2 diabetes mellitus with hypercholesterolemia (HCC)  Class 1 obesity with serious comorbidity and body mass index (BMI) of 32.0 to 32.9 in adult, unspecified obesity type     Assessment and Plan Assessment & Plan         Obesity Treatment / Action Plan:  Patient will work on garnering support from family and friends to begin weight loss journey. Will work on eliminating or reducing the presence of highly palatable, calorie dense foods in the home. Will complete provided nutritional and psychosocial assessment questionnaire before the next appointment. Will be scheduled for indirect calorimetry to determine resting energy expenditure in a fasting state.  This will allow us  to create  a reduced calorie, high-protein meal plan to promote loss of fat mass while preserving muscle mass. Counseled on the health benefits of losing 5%-15% of total body weight. Was counseled on nutritional approaches to weight loss and benefits of reducing processed foods and consuming plant-based foods and high quality protein as part of nutritional weight management. Was counseled on pharmacotherapy and role as an adjunct in weight management.   Obesity Education Performed Today:  She was weighed on the bioimpedance scale and results were discussed and documented in the synopsis.  We discussed obesity as a disease and the importance of a more detailed evaluation of all the factors contributing to the disease.  We discussed the importance of long term lifestyle changes which include nutrition, exercise and behavioral modifications as well as the importance of customizing this to her specific health and social needs.  We discussed the benefits of reaching a healthier weight to alleviate the symptoms of existing conditions and reduce the risks of the biomechanical, metabolic and psychological effects of obesity.  We reviewed the four pillars of obesity medicine and importance of using a multimodal approach.  We reviewed the basic principles in weight management.   Sophiah Lakatos appears to be in the action stage of change and states they are ready to start intensive lifestyle modifications and behavioral modifications.  I have spent 59 minutes in the care of the patient today including: 9 minutes before the visit reviewing and preparing the chart. 47 minutes face-to-face assessing and reviewing listed medical problems as outlined in obesity care plan, providing nutritional and behavioral counseling on topics outlined in the obesity care plan, independently interpreting test results and goals of care, as described in assessment and plan, reviewing and discussing biometric information and progress,  reviewing latest PCP notes and specialist consultations, and managing referral  3 minutes after the visit updating chart and documentation of encounter.  Reviewed by clinician on day of visit: allergies, medications, problem list, medical history, surgical history, family history, social history, and previous encounter notes pertinent to obesity diagnosis.   Marcelyn Ruppe ANP-C

## 2023-11-29 ENCOUNTER — Telehealth: Payer: Self-pay

## 2023-11-29 NOTE — Telephone Encounter (Signed)
 Order has been faxed back to [facility] on 11/28/23

## 2023-12-03 ENCOUNTER — Encounter (HOSPITAL_BASED_OUTPATIENT_CLINIC_OR_DEPARTMENT_OTHER): Payer: Self-pay | Admitting: Family

## 2023-12-03 ENCOUNTER — Ambulatory Visit (HOSPITAL_BASED_OUTPATIENT_CLINIC_OR_DEPARTMENT_OTHER): Admitting: Family

## 2023-12-03 VITALS — BP 132/88 | HR 68 | Resp 19 | Ht <= 58 in | Wt 163.0 lb

## 2023-12-03 DIAGNOSIS — Z87898 Personal history of other specified conditions: Secondary | ICD-10-CM | POA: Diagnosis not present

## 2023-12-03 DIAGNOSIS — I25118 Atherosclerotic heart disease of native coronary artery with other forms of angina pectoris: Secondary | ICD-10-CM | POA: Diagnosis not present

## 2023-12-03 DIAGNOSIS — I5032 Chronic diastolic (congestive) heart failure: Secondary | ICD-10-CM | POA: Diagnosis not present

## 2023-12-03 DIAGNOSIS — I10 Essential (primary) hypertension: Secondary | ICD-10-CM

## 2023-12-03 DIAGNOSIS — E785 Hyperlipidemia, unspecified: Secondary | ICD-10-CM | POA: Diagnosis not present

## 2023-12-03 DIAGNOSIS — R001 Bradycardia, unspecified: Secondary | ICD-10-CM | POA: Diagnosis not present

## 2023-12-03 NOTE — Progress Notes (Signed)
 Cardiology Office Note:  .   Date:  12/03/2023  ID:  Junella Domke, DOB [birth year], MRN [id number] PCP: Joshua Debby CROME, MD  [facility] Providers Cardiologist:  Annabella Scarce, MD    History of Present Illness: .   Summers Daniely is a 83 y.o. female with a hx of HTN, HLD, Dm2, hypothyroidism, OSA not on CPAP, junctional bradycardia, HFpEF.   Initially seen by Dr. Lillette in 07/2011 for dyspnea at rest with Myoview , PFT unremarkable. RHC 09/2011 normal R heart pressure. Echo 08/2020 LVEF 60-65%, mild LVH, gr1dd, aortic sclerosis without stenosis, trivial MR.   Seen by Dr. Scarce 07/2022 with intermittent dyspnea on exertion.  Spironolactone  added for blood pressure control.  Coronary CTA with calcium  score 19.1 placing her in the 50th percentile for age/sex.  Echocardiogram normal LVEF 55 to 60%, no RWMA, grade 1 diastolic dysfunction, aortic sclerosis without stenosis.  Admitted 12/2022 for junctional bradycardia.  The day prior to admission for cardiac CT she took metoprolol  tart to 100 mg, held her carvedilol , took her regular diltiazem .  She went to the ED for her and in the 30s.  Atropine  and IV glucagon  were administered.  Carvedilol  diltiazem  held on discharge.  Losartan  spironolactone  resumed.  She wore live monitor for 4 days with heart rate 64-136 bpm.  She was last seen 09/03/23. Referred to [facility] as well as [facility]. Her weigh twas up 10 lbs, lasix  20mg  daily x 3 days prescribed. Due to syncope, she wore a monitor with predominantly normal sinus rhythm average heart rate 75 bpm with 17 episodes of SVT up to 18.1 seconds. Due to history of junctional bradycardia, Coreg  3.125mg  BID continued. There were no significant pauses or heart block by her EKG.  Presents today for follow up. Weight stable from previous. Feeling overall well since last seen. No recurrent syncope. No chest pain. She has home oxygen which she is using intermittently  at night. She reports sleeping okay at night and no different without her home oxygen.   ROS: Please see the history of present illness.    All other systems reviewed and are negative.   Studies Reviewed: .          Cardiac Studies & Procedures   ______________________________________________________________________________________________     ECHOCARDIOGRAM  ECHOCARDIOGRAM COMPLETE 01/01/2023  Narrative ECHOCARDIOGRAM REPORT    Patient Name:   JONA ERKKILA Date of Exam: 01/01/2023 Medical Rec #:  [id number]      Height:       59.0 in Accession #:    [id number]     Weight:       157.0 lb Date of Birth:  1940/04/28      BSA:          1.664 m Patient Age:    82 years       BP:           126/84 mmHg Patient Gender: F              HR:           67 bpm. Exam Location:  Outpatient  Procedure: 2D Echo, 3D Echo, Color Doppler, Cardiac Doppler and Strain Analysis  Indications:    Dyspnea  History:        Patient has prior history of Echocardiogram examinations, most recent 08/26/2020. Risk Factors:Hypertension, Dyslipidemia, Former Smoker and Diabetes. Mild late onset Alzheimers dementia.  Sonographer:    Orvil Holmes RDCS Referring Phys: [id number] [facility] [place]  IMPRESSIONS   1. Left ventricular ejection fraction, by estimation, is 55 to 60%. The left ventricle has normal function. The left ventricle has no regional wall motion abnormalities. Left ventricular diastolic parameters are consistent with Grade I diastolic dysfunction (impaired relaxation). 2. Right ventricular systolic function is normal. The right ventricular size is normal. Tricuspid regurgitation signal is inadequate for assessing PA pressure. 3. The mitral valve is grossly normal. Trivial mitral valve regurgitation. 4. The aortic valve is tricuspid. Aortic valve regurgitation is not visualized. Aortic valve sclerosis/calcification is present, without any evidence of aortic stenosis. 5. The inferior vena  cava is normal in size with greater than 50% respiratory variability, suggesting right atrial pressure of 3 mmHg.  Comparison(s): Changes from prior study are noted. 08/26/2020: LVEF 60-65%, grade 1 DD, aortic valve sclerosis.  FINDINGS Left Ventricle: Left ventricular ejection fraction, by estimation, is 55 to 60%. The left ventricle has normal function. The left ventricle has no regional wall motion abnormalities. The left ventricular internal cavity size was normal in size. There is no left ventricular hypertrophy. Left ventricular diastolic parameters are consistent with Grade I diastolic dysfunction (impaired relaxation).  Right Ventricle: The right ventricular size is normal. No increase in right ventricular wall thickness. Right ventricular systolic function is normal. Tricuspid regurgitation signal is inadequate for assessing PA pressure.  Left Atrium: Left atrial size was normal in size.  Right Atrium: Right atrial size was normal in size.  Pericardium: There is no evidence of pericardial effusion.  Mitral Valve: The mitral valve is grossly normal. Trivial mitral valve regurgitation.  Tricuspid Valve: The tricuspid valve is normal in structure. Tricuspid valve regurgitation is not demonstrated.  Aortic Valve: The aortic valve is tricuspid. Aortic valve regurgitation is not visualized. Aortic valve sclerosis/calcification is present, without any evidence of aortic stenosis.  Pulmonic Valve: The pulmonic valve was normal in structure. Pulmonic valve regurgitation is not visualized.  Aorta: The aortic root and ascending aorta are structurally normal, with no evidence of dilitation.  Venous: The inferior vena cava is normal in size with greater than 50% respiratory variability, suggesting right atrial pressure of 3 mmHg.  IAS/Shunts: No atrial level shunt detected by color flow Doppler.   LEFT VENTRICLE PLAX 2D LVIDd:         3.30 cm LVIDs:         2.14 cm LV PW:         0.82  cm LV IVS:        0.88 cm LVOT diam:     1.80 cm   3D Volume EF: LVOT Area:     2.54 cm  3D EF:        54 % LV EDV:       75 ml LV ESV:       35 ml LV SV:        41 ml  RIGHT VENTRICLE RV Basal diam:  3.60 cm RV Mid diam:    2.83 cm RV S prime:     9.90 cm/s TAPSE (M-mode): 2.6 cm  LEFT ATRIUM           Index        RIGHT ATRIUM           Index LA diam:      3.20 cm 1.92 cm/m   RA Area:     12.50 cm LA Vol (A2C): 35.0 ml 21.03 ml/m  RA Volume:   29.60 ml  17.79 ml/m  AORTA Ao Root diam: 2.70  cm  MITRAL VALVE MV Area (PHT): 4.06 cm       SHUNTS MV Decel Time: 187 msec       Systemic Diam: 1.80 cm MR Peak grad:    124.1 mmHg MR Mean grad:    94.0 mmHg MR Vmax:         557.00 cm/s MR Vmean:        463.0 cm/s MR PISA:         5.09 cm MR PISA Eff ROA: 35 mm MR PISA Radius:  0.90 cm MV E velocity: 89.60 cm/s MV A velocity: 95.90 cm/s MV E/A ratio:  0.93  Vinie Maxcy MD Electronically signed by Vinie Maxcy MD Signature Date/Time: 01/01/2023/3:07:38 PM    Final    MONITORS  LONG TERM MONITOR (3-14 DAYS) 09/25/2023  Narrative 14 Day Zio Monitor  Quality: Fair.  Baseline artifact. Predominant rhythm: sinus rhythm Average heart rate: 75 bpm Max heart rate: 101 bpm Min heart rate: 59 bpm Pauses >2.5 seconds: none  Rare (<1%) PACs and PVCs. 17 episodes of SVT and atrial tachycardia.  Longest episode 18.1 sec.  Tiffany C. Raford, MD, [facility], [place] 10/02/2023 2:55 PM   CT SCANS  CT CORONARY MORPH W/CTA COR W/SCORE 12/20/2022  Addendum 01/12/2023  5:24 PM ADDENDUM REPORT: 01/12/2023 17:21  EXAM: OVER-READ INTERPRETATION  CT CHEST  The following report is an over-read performed by radiologist Dr. Selinda Saas [facility] on 01/12/2023. This over-read does not include interpretation of cardiac or coronary anatomy or pathology. The coronary CTA interpretation by the cardiologist is attached.  COMPARISON:  11/20/2021 chest radiograph.   09/06/2011 chest CT.  FINDINGS: Cardiovascular: Top-normal heart size. No significant pericardial effusion/thickening. Atherosclerotic nonaneurysmal thoracic aorta. Normal caliber pulmonary arteries. No central pulmonary emboli.  Mediastinum/Nodes: Mildly patulous thoracic esophagus with mild layering fluid and debris. No pathologically enlarged mediastinal or hilar lymph nodes.  Lungs/Pleura: No pneumothorax. No pleural effusion. No acute consolidative airspace disease, lung masses or significant pulmonary nodules.  Upper abdomen: Mildly hypervascular 1.1 cm focus in the small spleen (series 14/image 43), potentially stable as seen on series 2/image 14 on 06/17/2013 CT abdomen study, although difficult to compare given different phases of contrast. Chronic hyperdense foci along the margin of the esophagogastric junction presumably from prior hiatal hernia repair.  Musculoskeletal: No aggressive appearing focal osseous lesions. Marked thoracic spondylosis.  IMPRESSION: 1. Mildly patulous thoracic esophagus with mild layering fluid and debris, suggesting chronic esophageal dysmotility. 2. Mildly hypervascular 1.1 cm focus in the small spleen, potentially stable as seen on 06/17/2013 CT abdomen study, although difficult to compare given different phases of contrast. This is most likely a benign lesion such as a hemangioma. No follow-up imaging recommended. 3.  Aortic Atherosclerosis (ICD10-I70.0).   Electronically Signed By: Selinda DELENA Blue M.D. On: 01/12/2023 17:21  Narrative HISTORY: Dyspnea on exertion (DOE)  EXAM: Cardiac/Coronary  CT  TECHNIQUE: The patient was scanned on a [employer].  PROTOCOL: A non-contrast, gated CT scan was obtained with axial slices of 3 mm through the heart for calcium  scoring. Calcium  scoring was performed using the Agatston method. A 120 kV prospective, gated, contrast cardiac scan was obtained. Gantry rotation speed was 250  msecs and collimation was 0.6 mm. Two sublingual nitroglycerin  tablets (0.8 mg) were given. The 3D data set was reconstructed in 5% intervals of the 35-75% of the R-R cycle. Diastolic phases were analyzed on a dedicated workstation using MPR, MIP, and VRT modes. The patient received 95 cc of contrast.  FINDINGS: Image quality: Poor.  Artifact: Moderate (slab and breathing artifacts).  Coronary artery calcification score:  Left main: 0  Left anterior descending artery: 53.7  Left circumflex artery: 16.5  Right coronary artery: 21.9  Total coronary calcium  score of 92.1, places the patient at the 58th percentile for age and sex matched control.  Coronary arteries: Normal coronary origins.  Right dominance.  Left Main Coronary Artery: Normal caliber vessel, originates from the left coronary cusp, bifurcates to form a left anterior descending artery and a left circumflex artery. There is no plaque or stenosis.  Left Anterior Descending Coronary Artery: Normal caliber vessel, reaches the apex, gives off 2 diagonal branches. The ostial LAD contains minimal calcified plaque (0-24%) and minimal luminal irregularities within proximal to mid segment. Mid to apical segments are patent overall but accuracy limited due to artifact.  First Diagonal branch: Patent.  Second Diagonal branch: Patent.  Left Circumflex Artery: Normal caliber vessel, non-dominant, travels within the atrioventricular groove and tapers off. It gives off 1 patent obtuse marginal branch. The proximal LCX contains mild mixed plaque (25-49%) and mid to distal vessel tapers off.  First Obtuse Marginal branch: Normal caliber, large size, overall patent.  Right Coronary Artery: Dominant vessel, originates from the right coronary cusp, terminates as a PDA and right posterolateral branch. RCA is grossly patent with minimal luminal irregularities due to soft and calcified plaque, but overall accuracy is limited  due to artifacts. PDA is not well visualized.  Left Atrium: Grossly normal in size with no left atrial appendage filling defect.  Left Ventricle: Grossly normal in size. There are no stigmata of prior infarction. There is no abnormal filling defect.  Pulmonary arteries: No proximal filling defect.  Pulmonary veins: Normal pulmonary venous drainage (three veins on the right and two veins on the left).  Aorta: Normal size, 27.5 mm at the mid ascending aorta (level of the PA bifurcation) measured double oblique. Aortic atherosclerosis. No dissection.  Pericardium: Normal thickness with no significant effusion or calcium  present.  Cardiac valves: The aortic valve is trileaflet without significant calcification. The mitral valve is normal structure without significant calcification.  Extra-cardiac findings: See attached radiology report for non-cardiac structures.  IMPRESSION: 1. Coronary calcium  score of 92.1. This was 58th percentile for age-, sex, and race-matched controls.  2. Normal coronary origin with right dominance.  3. Aortic atherosclerosis.  4. CAD-RADS = 2 Mild non-obstructive CAD.  5. Presence of both slab and breathing artifacts may effect the overall accuracy of the study. The study did not pass the quality control protocol for it to be sent to Heart Flow for plaque analysis, roadmap, or CT-FFR.  RECOMMENDATIONS:  Consider non-atherosclerotic causes of chest pain. Consider preventive therapy and risk factor modification.  Electronically Signed: By: Madonna Large D.O. On: 12/20/2022 14:40     ______________________________________________________________________________________________      Risk Assessment/Calculations:             Physical Exam:   VS:  BP 132/88 (BP Location: Left Arm, Patient Position: Sitting, Cuff Size: Normal)   Pulse 68   Resp 19   Ht 4' 9 (1.448 m)   Wt 163 lb (73.9 kg)   SpO2 96%   BMI 35.27 kg/m    Wt Readings  from Last 3 Encounters:  12/03/23 163 lb (73.9 kg)  11/28/23 156 lb (70.8 kg)  10/01/23 160 lb (72.6 kg)    GEN: Well nourished, well developed in no acute distress NECK: No JVD; No carotid bruits  CARDIAC: RRR, no murmurs, rubs, gallops RESPIRATORY:  Clear to auscultation without rales, wheezing or rhonchi  ABDOMEN: Soft, non-tender, non-distended EXTREMITIES:  No edema; No deformity   ASSESSMENT AND PLAN: .    Junctional bradycardia- No recurrent bradycardia since discontinuation of Diltiazem . Tolerating low dose Coreg  3.125mg  BID. Would not further increase dose. Monitor with periodic EKG.   Mild nonobstructive coronary artery disease/HLD, LDL goal less than 70- Stable with no anginal symptoms. No indication for ischemic evaluation.  GDMT Coreg  3.125mg  BID, Jardiance  10mg  dialy, Rosuvastatin  10mg  daily.   Chronic diastolic heart failure- Euvolemic and well compensated on exam.   GDMT includes Coreg  3.125mg  BID, Jardiance  10mg  daily, Losartan  100mg  daily, Spironolactone  25mg  daily.   Hypertension- BP reasonably controlled in clinic. Continue current antihypertensive regimen Coreg  3.125mg  BID, Losartan  100mg  daily, Spironolactone  25mg  daily. .Discussed to monitor BP at home at least 2 hours after medications and sitting for 5-10 minutes. Due to history of syncope, will not further intensify regimen.   Syncope - Recent 14 day ZIO with no significant arrhythmias. No recurrence. No indication for further workup at this time.   BMI 35 - Goal to lose 30 pounds. Established with [facility]       Dispo: follow up in 6 months  Signed, Reche GORMAN Finder, NP

## 2023-12-03 NOTE — Patient Instructions (Signed)
 Medication Instructions:  Continue your current medications  *If you need a refill on your cardiac medications before your next appointment, please call your pharmacy*  Follow-Up: At [facility], you and your health needs are our priority.  As part of our continuing mission to provide you with exceptional heart care, our providers are all part of one team.  This team includes your primary Cardiologist (physician) and Advanced Practice Providers or APPs (Physician Assistants and Nurse Practitioners) who all work together to provide you with the care you need, when you need it.  Your next appointment:   6 month(s)  Provider:   Annabella Scarce, MD    We recommend signing up for the patient portal called MyChart.  Sign up information is provided on this After Visit Summary.  MyChart is used to connect with patients for Virtual Visits (Telemedicine).  Patients are able to view lab/test results, encounter notes, upcoming appointments, etc.  Non-urgent messages can be sent to your provider as well.   To learn more about what you can do with MyChart, go to [email or web address].   Other Instructions  Recommend calling in January to schedule March appointments.   Your heart monitor looked good!  Recommend scheduling a follow up with pulmonology Dr. Neda before returning the home oxygen.  Phone: [phone]

## 2023-12-05 ENCOUNTER — Other Ambulatory Visit: Payer: Self-pay | Admitting: Nurse Practitioner

## 2023-12-05 DIAGNOSIS — N644 Mastodynia: Secondary | ICD-10-CM

## 2023-12-05 DIAGNOSIS — N952 Postmenopausal atrophic vaginitis: Secondary | ICD-10-CM | POA: Diagnosis not present

## 2023-12-05 DIAGNOSIS — B372 Candidiasis of skin and nail: Secondary | ICD-10-CM | POA: Diagnosis not present

## 2023-12-19 ENCOUNTER — Encounter

## 2023-12-19 ENCOUNTER — Other Ambulatory Visit

## 2023-12-21 ENCOUNTER — Other Ambulatory Visit (HOSPITAL_COMMUNITY): Payer: Self-pay

## 2023-12-23 ENCOUNTER — Other Ambulatory Visit (HOSPITAL_COMMUNITY): Payer: Self-pay

## 2023-12-23 ENCOUNTER — Other Ambulatory Visit: Payer: Self-pay

## 2023-12-26 ENCOUNTER — Ambulatory Visit (INDEPENDENT_AMBULATORY_CARE_PROVIDER_SITE_OTHER): Admitting: Nurse Practitioner

## 2023-12-30 ENCOUNTER — Other Ambulatory Visit (HOSPITAL_COMMUNITY): Payer: Self-pay

## 2023-12-30 ENCOUNTER — Other Ambulatory Visit: Payer: Self-pay

## 2023-12-31 ENCOUNTER — Ambulatory Visit: Payer: Self-pay

## 2023-12-31 ENCOUNTER — Ambulatory Visit: Admitting: Family Medicine

## 2023-12-31 ENCOUNTER — Ambulatory Visit: Payer: Self-pay | Admitting: Internal Medicine

## 2023-12-31 ENCOUNTER — Ambulatory Visit: Admitting: Internal Medicine

## 2023-12-31 ENCOUNTER — Encounter: Payer: Self-pay | Admitting: Internal Medicine

## 2023-12-31 VITALS — BP 132/88 | HR 68 | Ht <= 58 in | Wt 162.0 lb

## 2023-12-31 VITALS — BP 148/84 | HR 67 | Temp 98.1°F | Resp 16 | Ht <= 58 in | Wt 162.2 lb

## 2023-12-31 DIAGNOSIS — I10 Essential (primary) hypertension: Secondary | ICD-10-CM

## 2023-12-31 DIAGNOSIS — G8929 Other chronic pain: Secondary | ICD-10-CM

## 2023-12-31 DIAGNOSIS — Z23 Encounter for immunization: Secondary | ICD-10-CM

## 2023-12-31 DIAGNOSIS — G301 Alzheimer's disease with late onset: Secondary | ICD-10-CM | POA: Diagnosis not present

## 2023-12-31 DIAGNOSIS — M25562 Pain in left knee: Secondary | ICD-10-CM | POA: Diagnosis not present

## 2023-12-31 DIAGNOSIS — E118 Type 2 diabetes mellitus with unspecified complications: Secondary | ICD-10-CM

## 2023-12-31 DIAGNOSIS — M170 Bilateral primary osteoarthritis of knee: Secondary | ICD-10-CM

## 2023-12-31 DIAGNOSIS — E785 Hyperlipidemia, unspecified: Secondary | ICD-10-CM

## 2023-12-31 DIAGNOSIS — M25561 Pain in right knee: Secondary | ICD-10-CM | POA: Diagnosis not present

## 2023-12-31 DIAGNOSIS — F02A0 Dementia in other diseases classified elsewhere, mild, without behavioral disturbance, psychotic disturbance, mood disturbance, and anxiety: Secondary | ICD-10-CM | POA: Diagnosis not present

## 2023-12-31 DIAGNOSIS — E039 Hypothyroidism, unspecified: Secondary | ICD-10-CM

## 2023-12-31 LAB — URINALYSIS, ROUTINE W REFLEX MICROSCOPIC
Bilirubin Urine: NEGATIVE
Hgb urine dipstick: NEGATIVE
Ketones, ur: NEGATIVE
Leukocytes,Ua: NEGATIVE
Nitrite: NEGATIVE
RBC / HPF: NONE SEEN (ref 0–?)
Specific Gravity, Urine: 1.015 (ref 1.000–1.030)
Total Protein, Urine: NEGATIVE
Urine Glucose: >=1000 — AB
Urobilinogen, UA: 1 (ref 0.0–1.0)
pH: 7 (ref 5.0–8.0)

## 2023-12-31 LAB — CBC WITH DIFFERENTIAL/PLATELET
Basophils Absolute: 0.1 K/uL (ref 0.0–0.1)
Basophils Relative: 1 % (ref 0.0–3.0)
Eosinophils Absolute: 0.1 K/uL (ref 0.0–0.7)
Eosinophils Relative: 1.1 % (ref 0.0–5.0)
HCT: 40.3 % (ref 36.0–46.0)
Hemoglobin: 13.3 g/dL (ref 12.0–15.0)
Lymphocytes Relative: 27.5 % (ref 12.0–46.0)
Lymphs Abs: 1.9 K/uL (ref 0.7–4.0)
MCHC: 33 g/dL (ref 30.0–36.0)
MCV: 93.7 fl (ref 78.0–100.0)
Monocytes Absolute: 0.7 K/uL (ref 0.1–1.0)
Monocytes Relative: 10.5 % (ref 3.0–12.0)
Neutro Abs: 4.1 K/uL (ref 1.4–7.7)
Neutrophils Relative %: 59.9 % (ref 43.0–77.0)
Platelets: 215 K/uL (ref 150.0–400.0)
RBC: 4.3 Mil/uL (ref 3.87–5.11)
RDW: 15.2 % (ref 11.5–15.5)
WBC: 6.8 K/uL (ref 4.0–10.5)

## 2023-12-31 LAB — HEMOGLOBIN A1C: Hgb A1c MFr Bld: 6.2 % (ref 4.6–6.5)

## 2023-12-31 LAB — LIPID PANEL
Cholesterol: 175 mg/dL (ref 0–200)
HDL: 84.4 mg/dL (ref >39.00)
LDL Cholesterol: 77 mg/dL (ref 0–99)
NonHDL: 90.53
Total CHOL/HDL Ratio: 2
Triglycerides: 67 mg/dL (ref 0.0–149.0)
VLDL: 13.4 mg/dL (ref 0.0–40.0)

## 2023-12-31 LAB — BASIC METABOLIC PANEL WITH GFR
BUN: 14 mg/dL (ref 6–23)
CO2: 26 meq/L (ref 19–32)
Calcium: 9 mg/dL (ref 8.4–10.5)
Chloride: 106 meq/L (ref 96–112)
Creatinine, Ser: 0.83 mg/dL (ref 0.40–1.20)
GFR: 64.67 mL/min (ref >60.00)
Glucose, Bld: 97 mg/dL (ref 70–99)
Potassium: 3.4 meq/L — ABNORMAL LOW (ref 3.5–5.1)
Sodium: 140 meq/L (ref 135–145)

## 2023-12-31 LAB — HEPATIC FUNCTION PANEL
ALT: 20 U/L (ref 0–35)
AST: 20 U/L (ref 0–37)
Albumin: 4.3 g/dL (ref 3.5–5.2)
Alkaline Phosphatase: 61 U/L (ref 39–117)
Bilirubin, Direct: 0.2 mg/dL (ref 0.0–0.3)
Total Bilirubin: 0.8 mg/dL (ref 0.2–1.2)
Total Protein: 7 g/dL (ref 6.0–8.3)

## 2023-12-31 LAB — TSH: TSH: 6.49 u[IU]/mL — ABNORMAL HIGH (ref 0.35–5.50)

## 2023-12-31 MED ORDER — COVID-19 MRNA VAC-TRIS(PFIZER) 30 MCG/0.3ML IM SUSY: 0.3000 mL | PREFILLED_SYRINGE | Freq: Once | INTRAMUSCULAR | 0 refills | Status: AC

## 2023-12-31 MED ORDER — TORSEMIDE 20 MG PO TABS: 20.0000 mg | ORAL_TABLET | Freq: Every day | ORAL | 0 refills | Status: AC

## 2023-12-31 MED ORDER — TRIAMCINOLONE ACETONIDE 32 MG IX SRER
32.0000 mg | Freq: Once | INTRA_ARTICULAR | Status: AC
  Administered 2023-12-31: 32 mg via INTRA_ARTICULAR

## 2023-12-31 MED ORDER — MEMANTINE HCL ER 28 MG PO CP24
28.0000 mg | ORAL_CAPSULE | Freq: Every day | ORAL | 1 refills | Status: AC
  Filled 2024-03-30 – 2024-04-06 (×2): qty 90, 90d supply, fill #0

## 2023-12-31 MED ORDER — AREXVY 120 MCG/0.5ML IM SUSR: 0.5000 mL | Freq: Once | INTRAMUSCULAR | 0 refills | Status: AC

## 2023-12-31 NOTE — Patient Instructions (Signed)
 Hypertension, Adult High blood pressure (hypertension) is when the force of blood pumping through the arteries is too strong. The arteries are the blood vessels that carry blood from the heart throughout the body. Hypertension forces the heart to work harder to pump blood and may cause arteries to become narrow or stiff. Untreated or uncontrolled hypertension can lead to a heart attack, heart failure, a stroke, kidney disease, and other problems. A blood pressure reading consists of a higher number over a lower number. Ideally, your blood pressure should be below 120/80. The first ("top") number is called the systolic pressure. It is a measure of the pressure in your arteries as your heart beats. The second ("bottom") number is called the diastolic pressure. It is a measure of the pressure in your arteries as the heart relaxes. What are the causes? The exact cause of this condition is not known. There are some conditions that result in high blood pressure. What increases the risk? Certain factors may make you more likely to develop high blood pressure. Some of these risk factors are under your control, including: Smoking. Not getting enough exercise or physical activity. Being overweight. Having too much fat, sugar, calories, or salt (sodium) in your diet. Drinking too much alcohol. Other risk factors include: Having a personal history of heart disease, diabetes, high cholesterol, or kidney disease. Stress. Having a family history of high blood pressure and high cholesterol. Having obstructive sleep apnea. Age. The risk increases with age. What are the signs or symptoms? High blood pressure may not cause symptoms. Very high blood pressure (hypertensive crisis) may cause: Headache. Fast or irregular heartbeats (palpitations). Shortness of breath. Nosebleed. Nausea and vomiting. Vision changes. Severe chest pain, dizziness, and seizures. How is this diagnosed? This condition is diagnosed by  measuring your blood pressure while you are seated, with your arm resting on a flat surface, your legs uncrossed, and your feet flat on the floor. The cuff of the blood pressure monitor will be placed directly against the skin of your upper arm at the level of your heart. Blood pressure should be measured at least twice using the same arm. Certain conditions can cause a difference in blood pressure between your right and left arms. If you have a high blood pressure reading during one visit or you have normal blood pressure with other risk factors, you may be asked to: Return on a different day to have your blood pressure checked again. Monitor your blood pressure at home for 1 week or longer. If you are diagnosed with hypertension, you may have other blood or imaging tests to help your health care provider understand your overall risk for other conditions. How is this treated? This condition is treated by making healthy lifestyle changes, such as eating healthy foods, exercising more, and reducing your alcohol intake. You may be referred for counseling on a healthy diet and physical activity. Your health care provider may prescribe medicine if lifestyle changes are not enough to get your blood pressure under control and if: Your systolic blood pressure is above 130. Your diastolic blood pressure is above 80. Your personal target blood pressure may vary depending on your medical conditions, your age, and other factors. Follow these instructions at home: Eating and drinking  Eat a diet that is high in fiber and potassium, and low in sodium, added sugar, and fat. An example of this eating plan is called the DASH diet. DASH stands for Dietary Approaches to Stop Hypertension. To eat this way: Eat  plenty of fresh fruits and vegetables. Try to fill one half of your plate at each meal with fruits and vegetables. Eat whole grains, such as whole-wheat pasta, brown rice, or whole-grain bread. Fill about one  fourth of your plate with whole grains. Eat or drink low-fat dairy products, such as skim milk or low-fat yogurt. Avoid fatty cuts of meat, processed or cured meats, and poultry with skin. Fill about one fourth of your plate with lean proteins, such as fish, chicken without skin, beans, eggs, or tofu. Avoid pre-made and processed foods. These tend to be higher in sodium, added sugar, and fat. Reduce your daily sodium intake. Many people with hypertension should eat less than 1,500 mg of sodium a day. Do not drink alcohol if: Your health care provider tells you not to drink. You are pregnant, may be pregnant, or are planning to become pregnant. If you drink alcohol: Limit how much you have to: 0-1 drink a day for women. 0-2 drinks a day for men. Know how much alcohol is in your drink. In the [place] one drink equals one 12 oz bottle of beer (355 mL), one 5 oz glass of wine (148 mL), or one 1 oz glass of hard liquor (44 mL). Lifestyle  Work with your health care provider to maintain a healthy body weight or to lose weight. Ask what an ideal weight is for you. Get at least 30 minutes of exercise that causes your heart to beat faster (aerobic exercise) most days of the week. Activities may include walking, swimming, or biking. Include exercise to strengthen your muscles (resistance exercise), such as Pilates or lifting weights, as part of your weekly exercise routine. Try to do these types of exercises for 30 minutes at least 3 days a week. Do not use any products that contain nicotine or tobacco. These products include cigarettes, chewing tobacco, and vaping devices, such as e-cigarettes. If you need help quitting, ask your health care provider. Monitor your blood pressure at home as told by your health care provider. Keep all follow-up visits. This is important. Medicines Take over-the-counter and prescription medicines only as told by your health care provider. Follow directions carefully. Blood  pressure medicines must be taken as prescribed. Do not skip doses of blood pressure medicine. Doing this puts you at risk for problems and can make the medicine less effective. Ask your health care provider about side effects or reactions to medicines that you should watch for. Contact a health care provider if you: Think you are having a reaction to a medicine you are taking. Have headaches that keep coming back (recurring). Feel dizzy. Have swelling in your ankles. Have trouble with your vision. Get help right away if you: Develop a severe headache or confusion. Have unusual weakness or numbness. Feel faint. Have severe pain in your chest or abdomen. Vomit repeatedly. Have trouble breathing. These symptoms may be an emergency. Get help right away. Call 911. Do not wait to see if the symptoms will go away. Do not drive yourself to the hospital. Summary Hypertension is when the force of blood pumping through your arteries is too strong. If this condition is not controlled, it may put you at risk for serious complications. Your personal target blood pressure may vary depending on your medical conditions, your age, and other factors. For most people, a normal blood pressure is less than 120/80. Hypertension is treated with lifestyle changes, medicines, or a combination of both. Lifestyle changes include losing weight, eating a healthy,  low-sodium diet, exercising more, and limiting alcohol. This information is not intended to replace advice given to you by your health care provider. Make sure you discuss any questions you have with your health care provider. Document Revised: 01/10/2021 Document Reviewed: 01/10/2021 Elsevier Patient Education  2024 [employer].

## 2023-12-31 NOTE — Progress Notes (Signed)
 Subjective:  Patient ID: [name], female    DOB: [birth year]  Age: 83 y.o. MRN: [id number]  CC: Medical Management of Chronic Issues (3 month follow up. Discuss memory. Clear mucus since last October that she wants to discuss. Its waking her up in the middle of the night. ), Hypertension, Hypothyroidism, Hyperlipidemia, and Diabetes   HPI [name] presents for f/up ---  Discussed the use of AI scribe software for clinical note transcription with the patient, who gave verbal consent to proceed.  History of Present Illness [name] is an 83 year old female who presents with chronic cough and mucus production.  She has experienced a chronic cough with thick white mucus production since October of the previous year, following an episode of RSV, COVID-19, and pneumonia. There is no yellow or green discoloration in the mucus. She denies shortness of breath and chest pain. She used nitroglycerin  about a month ago, which provided relief at that time.  She is currently taking a diuretic and was not aware of any leg swelling until it was pointed out to her. No recent headaches, which she previously associated with high blood pressure.  She is planning to attend a nutritional program at the end of December. She has issues with constipation and reports that Miralax is not effective for her.  She mentions playing Mahjong as part of her activities.     Outpatient Medications Prior to Visit  Medication Sig Dispense Refill   acetaminophen  (TYLENOL ) 500 MG tablet Take 500 mg by mouth as needed for mild pain.     albuterol  (VENTOLIN  HFA) 108 (90 Base) MCG/ACT inhaler TAKE 2 PUFFS BY MOUTH EVERY 6 HOURS AS NEEDED FOR WHEEZE OR SHORTNESS OF BREATH 8.5 each 0   AMBULATORY NON FORMULARY MEDICATION Folding walker with front wheels.  Use as needed. Disp 1 R26.89 1 each 0   Blood Glucose Calibration (ACCU-CHEK GUIDE CONTROL) LIQD 1 Act by In Vitro route 2 (two) times daily as  needed. 1 each 3   Blood Glucose Monitoring Suppl (ACCU-CHEK GUIDE ME) w/Device KIT 1 Act by Does not apply route 2 (two) times daily as needed. 2 kit 2   carvedilol  (COREG ) 3.125 MG tablet Take 1 tablet (3.125 mg total) by mouth 2 (two) times daily with a meal. 180 tablet 0   clobetasol  ointment (TEMOVATE ) 0.05 % Apply topically as needed (irritation).     diclofenac  Sodium (VOLTAREN ) 1 % GEL Apply 4 g topically 4 (four) times daily. 100 g 5   empagliflozin  (JARDIANCE ) 10 MG TABS tablet Take 1 tablet (10 mg total) by mouth daily before breakfast. 90 tablet 0   estradiol (ESTRACE) 0.1 MG/GM vaginal cream Place 1 Applicatorful vaginally daily.     FLUoxetine  (PROZAC ) 40 MG capsule Take 1 capsule (40 mg total) by mouth daily. 90 capsule 0   glucose blood (ACCU-CHEK GUIDE) test strip 1 each by Other route 2 (two) times daily. 300 each 1   levothyroxine  (SYNTHROID ) 50 MCG tablet Take 1 tablet (50 mcg total) by mouth daily. 100 tablet 0   losartan  (COZAAR ) 100 MG tablet Take 1 tablet (100 mg total) by mouth daily. 90 tablet 0   nitroGLYCERIN  (NITROSTAT ) 0.4 MG SL tablet Place 1 tablet (0.4 mg total) under the tongue every 5 (five) minutes as needed for chest pain. 50 tablet 0   rosuvastatin  (CRESTOR ) 10 MG tablet Take 1 tablet (10 mg total) by mouth daily. 100 tablet 0   memantine  (NAMENDA  XR)  28 MG CP24 24 hr capsule Take 1 capsule (28 mg total) by mouth daily. 90 capsule 1   spironolactone  (ALDACTONE ) 25 MG tablet Take 1 tablet (25 mg total) by mouth daily. (Patient not taking: Reported on 12/31/2023) 90 tablet 0   furosemide  (LASIX ) 20 MG tablet Take one tablet daily in the morning for 3 days (Patient not taking: Reported on 12/31/2023) 3 tablet 0   No facility-administered medications prior to visit.    ROS Review of Systems  Constitutional:  Negative for appetite change, chills, diaphoresis, fatigue and fever.  HENT: Negative.    Respiratory:  Positive for cough. Negative for chest tightness,  shortness of breath and wheezing.   Cardiovascular:  Positive for leg swelling. Negative for chest pain and palpitations.  Gastrointestinal:  Positive for constipation. Negative for abdominal pain, diarrhea, nausea and vomiting.  Endocrine: Negative.   Genitourinary: Negative.  Negative for difficulty urinating, dysuria and hematuria.  Musculoskeletal:  Positive for arthralgias.  Skin: Negative.   Neurological: Negative.  Negative for dizziness and weakness.  Hematological:  Negative for adenopathy. Does not bruise/bleed easily.  Psychiatric/Behavioral:  Positive for confusion, decreased concentration, dysphoric mood and sleep disturbance. The patient is nervous/anxious.     Objective:  BP (!) 148/84 (BP Location: Left Arm, Patient Position: Sitting, Cuff Size: Normal)   Pulse 67   Temp 98.1 F (36.7 C) (Oral)   Resp 16   Ht 4' 9 (1.448 m)   Wt 162 lb 3.2 oz (73.6 kg)   SpO2 95%   BMI 35.10 kg/m   BP Readings from Last 3 Encounters:  12/31/23 (!) 148/84  12/31/23 132/88  12/03/23 132/88    Wt Readings from Last 3 Encounters:  12/31/23 162 lb 3.2 oz (73.6 kg)  12/31/23 162 lb (73.5 kg)  12/03/23 163 lb (73.9 kg)    Physical Exam Vitals reviewed.  Constitutional:      Appearance: Normal appearance.  HENT:     Nose: Nose normal.     Mouth/Throat:     Mouth: Mucous membranes are moist.  Eyes:     General: No scleral icterus.    Conjunctiva/sclera: Conjunctivae normal.  Cardiovascular:     Rate and Rhythm: Normal rate and regular rhythm.     Pulses: Normal pulses.     Heart sounds: No murmur heard.    No friction rub. No gallop.     Comments: EKG--- NSR, 68 bpm No LVH, Q waves, or ST/T wave changes  unchanged Pulmonary:     Effort: Pulmonary effort is normal.     Breath sounds: No stridor. No wheezing, rhonchi or rales.  Abdominal:     General: Abdomen is flat.     Palpations: There is no mass.     Tenderness: There is no abdominal tenderness. There is no  guarding.     Hernia: No hernia is present.  Musculoskeletal:        General: No swelling.     Cervical back: Neck supple.     Right lower leg: Pitting Edema (trace) present.     Left lower leg: Pitting Edema (trace) present.  Lymphadenopathy:     Cervical: No cervical adenopathy.  Skin:    General: Skin is warm and dry.  Neurological:     General: No focal deficit present.     Mental Status: She is alert.  Psychiatric:        Mood and Affect: Mood normal.        Behavior: Behavior normal.  Lab Results  Component Value Date   WBC 6.8 12/31/2023   HGB 13.3 12/31/2023   HCT 40.3 12/31/2023   PLT 215.0 12/31/2023   GLUCOSE 97 12/31/2023   CHOL 175 12/31/2023   TRIG 67.0 12/31/2023   HDL 84.40 12/31/2023   LDLDIRECT 120.6 11/24/2012   LDLCALC 77 12/31/2023   ALT 20 12/31/2023   AST 20 12/31/2023   NA 140 12/31/2023   K 3.4 (L) 12/31/2023   CL 106 12/31/2023   CREATININE 0.83 12/31/2023   BUN 14 12/31/2023   CO2 26 12/31/2023   TSH 6.49 (H) 12/31/2023   INR 1.0 11/20/2021   HGBA1C 6.2 12/31/2023   MICROALBUR 1.6 07/02/2023    MR Brain Wo Contrast Result Date: 07/23/2023 CLINICAL DATA:  Headache, new onset (Age >= 51y).  Memory loss. EXAM: MRI HEAD WITHOUT CONTRAST TECHNIQUE: Multiplanar, multiecho pulse sequences of the brain and surrounding structures were obtained without intravenous contrast. COMPARISON:  Head CT 11/20/2021 and MRI 10/31/2020 FINDINGS: Brain: There is no evidence of an acute infarct, intracranial hemorrhage, mass, midline shift, or extra-axial fluid collection. T2 hyperintensities in the cerebral white matter bilaterally have mildly progressed and are nonspecific but compatible with mild chronic small vessel ischemic disease. Mild to moderate cerebral atrophy is unchanged and most notable in the right greater than left perisylvian regions. Vascular: Major intracranial vascular flow voids are preserved. Skull and upper cervical spine: Unremarkable bone  marrow signal. Sinuses/Orbits: Bilateral cataract extraction. Paranasal sinuses and mastoid air cells are clear. Other: None. IMPRESSION: 1. No acute intracranial abnormality. 2. Mild chronic small vessel ischemic disease and mild to moderate cerebral atrophy. Electronically Signed   By: Dasie Hamburg M.D.   On: 07/23/2023 13:04    Assessment & Plan:  Type II diabetes mellitus with manifestations (HCC)- Blood sugar is well controlled. -     HM Diabetes Foot Exam -     Hemoglobin A1c; Future -     Urinalysis, Routine w reflex microscopic; Future  Mild late onset Alzheimer's dementia without behavioral disturbance, psychotic disturbance, mood disturbance, or anxiety (HCC) -     Memantine  HCl ER; Take 1 capsule (28 mg total) by mouth daily.  Dispense: 90 capsule; Refill: 1  Need for immunization against influenza -     Flu vaccine HIGH DOSE PF(Fluzone Trivalent)  Essential hypertension- BP is not at goal. EKG is negative for LVH. Will upgrade to a better loop diuretic. -     EKG 12-Lead -     Basic metabolic panel with GFR; Future -     CBC with Differential/Platelet; Future -     Urinalysis, Routine w reflex microscopic; Future -     Hepatic function panel; Future -     Torsemide; Take 1 tablet (20 mg total) by mouth daily.  Dispense: 90 tablet; Refill: 0  Hyperlipidemia with target LDL less than 100 -     Lipid panel; Future -     TSH; Future -     Hepatic function panel; Future  Acquired hypothyroidism- She is euthyroid. -     TSH; Future  Other orders -     Arexvy; Inject 0.5 mLs into the muscle once for 1 dose.  Dispense: 0.5 mL; Refill: 0 -     COVID-19 mRNA Vac-TriS(Pfizer); Inject 0.3 mLs into the muscle once for 1 dose.  Dispense: 0.3 mL; Refill: 0     Follow-up: Return in about 3 months (around 04/01/2024).  Debby Molt, MD

## 2023-12-31 NOTE — Patient Instructions (Signed)
Thank you for coming in today.   Call or go to the ER if you develop a large red swollen joint with extreme pain or oozing puss.    Return in 3 months.    

## 2023-12-31 NOTE — Progress Notes (Signed)
 LILLETTE Ileana Collet, PhD, LAT, ATC acting as a scribe for Artist Lloyd, MD.  [name] is a 83 y.o. female who presents to [facility] at [facility] today for exacerbation of her bilateral knee pain. Patient was last seen by Dr. Lloyd on 10/01/23 and was given repeat bilat Zilretta  injections.   Today, pt reports bilat knee pain has worsened over the last couple weeks. She notes pretty good relief from prior Zilretta  injections  Dx imaging: 08/08/20 R knee XR             01/16/20 L knee XR  Pertinent review of systems: No fevers or chills  Relevant historical information: Hypertension   Exam:  BP 132/88   Pulse 68   Ht 4' 9 (1.448 m)   Wt 162 lb (73.5 kg)   SpO2 96%   BMI 35.06 kg/m  General: Well Developed, well nourished, and in no acute distress.   MSK: Bilateral knees mild effusion normal-appearing otherwise normal motion.    Lab and Radiology Results   Zilretta  injection bilateral knee Procedure: Real-time Ultrasound Guided Injection of right knee joint superior lateral patellar space Device: Philips Affiniti 50G Images permanently stored and available for review in PACS Verbal informed consent obtained.  Discussed risks and benefits of procedure. Warned about infection, hyperglycemia bleeding, damage to structures among others. Patient expresses understanding and agreement Time-out conducted.   Noted no overlying erythema, induration, or other signs of local infection.   Skin prepped in a sterile fashion.   Local anesthesia: Topical Ethyl chloride.   With sterile technique and under real time ultrasound guidance: Zilretta  32 mg injected into knee joint. Fluid seen entering the joint capsule.   Completed without difficulty   Advised to call if fevers/chills, erythema, induration, drainage, or persistent bleeding.   Images permanently stored and available for review in the ultrasound unit.  Impression: Technically successful ultrasound guided  injection.   Procedure: Real-time Ultrasound Guided Injection of left knee joint superior lateral patellar space Device: Philips Affiniti 50G Images permanently stored and available for review in PACS Verbal informed consent obtained.  Discussed risks and benefits of procedure. Warned about infection, hyperglycemia bleeding, damage to structures among others. Patient expresses understanding and agreement Time-out conducted.   Noted no overlying erythema, induration, or other signs of local infection.   Skin prepped in a sterile fashion.   Local anesthesia: Topical Ethyl chloride.   With sterile technique and under real time ultrasound guidance: Zilretta  32 mg injected into knee joint. Fluid seen entering the joint capsule.   Completed without difficulty   Advised to call if fevers/chills, erythema, induration, drainage, or persistent bleeding.   Images permanently stored and available for review in the ultrasound unit.  Impression: Technically successful ultrasound guided injection.  Lot number: [id number]      Assessment and Plan: 83 y.o. female with bilateral knee pain due to DJD.  Plan for Zilretta  injections today.  Recheck in about 3 months or return sooner if needed.  Consider repeat injection at that visit if needed.   PDMP not reviewed this encounter. Orders Placed This Encounter  Procedures   US  LIMITED JOINT SPACE STRUCTURES LOW BILAT(NO LINKED CHARGES)    Reason for Exam (SYMPTOM  OR DIAGNOSIS REQUIRED):   bilateral knee pain    Preferred imaging location?:   [facility] ordered this encounter  Medications   Triamcinolone  Acetonide (ZILRETTA ) intra-articular injection 32 mg   Triamcinolone  Acetonide (ZILRETTA ) intra-articular injection  32 mg     Discussed warning signs or symptoms. Please see discharge instructions. Patient expresses understanding.   The above documentation has been reviewed and is accurate and complete Artist Lloyd,  M.D.

## 2024-01-02 ENCOUNTER — Ambulatory Visit: Admitting: Pulmonary Disease

## 2024-01-02 ENCOUNTER — Telehealth: Payer: Self-pay

## 2024-01-02 NOTE — Telephone Encounter (Signed)
 Copied from CRM #[id number]. Topic: Clinical - Order For Equipment >> Jan 01, 2024 11:53 AM Rilla NOVAK wrote: Reason for CRM: Patient states Dr Neda can can't cancel her oxygen, but she would like a small concentrator.  She states she uses [employer]. And would like to order or rent the smaller one.  Please call patient to follow up [phone].   ----------------------------------------------------------------------- From previous Reason for Contact - Scheduling: Patient/patient representative is calling to schedule an appointment. Refer to attachments for appointment information.  Dr Neda, is this okay?

## 2024-01-03 ENCOUNTER — Other Ambulatory Visit (HOSPITAL_COMMUNITY): Payer: Self-pay

## 2024-01-03 ENCOUNTER — Other Ambulatory Visit: Payer: Self-pay

## 2024-01-03 NOTE — Telephone Encounter (Signed)
 I am not very clear about what is needed.  If she needs a new order for oxygen supplementation, one can be provided  She also has not been seen in the office for about a year, she does need a follow-up appointment

## 2024-01-06 NOTE — Telephone Encounter (Signed)
 Patient was scheduled for 03/26/2024. NFN

## 2024-01-07 ENCOUNTER — Ambulatory Visit
Admission: RE | Admit: 2024-01-07 | Discharge: 2024-01-07 | Disposition: A | Source: Ambulatory Visit | Attending: Nurse Practitioner

## 2024-01-07 ENCOUNTER — Ambulatory Visit

## 2024-01-07 DIAGNOSIS — N644 Mastodynia: Secondary | ICD-10-CM

## 2024-01-07 DIAGNOSIS — R928 Other abnormal and inconclusive findings on diagnostic imaging of breast: Secondary | ICD-10-CM | POA: Diagnosis not present

## 2024-01-09 ENCOUNTER — Ambulatory Visit (INDEPENDENT_AMBULATORY_CARE_PROVIDER_SITE_OTHER): Admitting: Nurse Practitioner

## 2024-01-16 ENCOUNTER — Ambulatory Visit

## 2024-01-25 DIAGNOSIS — J9611 Chronic respiratory failure with hypoxia: Secondary | ICD-10-CM | POA: Diagnosis not present

## 2024-01-30 ENCOUNTER — Telehealth (HOSPITAL_BASED_OUTPATIENT_CLINIC_OR_DEPARTMENT_OTHER): Payer: Self-pay

## 2024-01-30 NOTE — Telephone Encounter (Signed)
 CMN received for oxygen supplies signed by provider and faxed confirmation received sent to [facility] medical equipment ([employer])

## 2024-02-01 ENCOUNTER — Other Ambulatory Visit: Payer: Self-pay | Admitting: Internal Medicine

## 2024-02-01 ENCOUNTER — Other Ambulatory Visit (HOSPITAL_COMMUNITY): Payer: Self-pay

## 2024-02-01 DIAGNOSIS — I5032 Chronic diastolic (congestive) heart failure: Secondary | ICD-10-CM

## 2024-02-03 ENCOUNTER — Ambulatory Visit (INDEPENDENT_AMBULATORY_CARE_PROVIDER_SITE_OTHER): Admitting: Family Medicine

## 2024-02-03 ENCOUNTER — Ambulatory Visit

## 2024-02-03 ENCOUNTER — Other Ambulatory Visit: Payer: Self-pay

## 2024-02-03 VITALS — BP 124/84 | HR 93 | Ht <= 58 in | Wt 161.0 lb

## 2024-02-03 DIAGNOSIS — M170 Bilateral primary osteoarthritis of knee: Secondary | ICD-10-CM

## 2024-02-03 DIAGNOSIS — M546 Pain in thoracic spine: Secondary | ICD-10-CM | POA: Diagnosis not present

## 2024-02-03 DIAGNOSIS — M25561 Pain in right knee: Secondary | ICD-10-CM

## 2024-02-03 DIAGNOSIS — M1710 Unilateral primary osteoarthritis, unspecified knee: Secondary | ICD-10-CM | POA: Diagnosis not present

## 2024-02-03 DIAGNOSIS — M25562 Pain in left knee: Secondary | ICD-10-CM | POA: Diagnosis not present

## 2024-02-03 DIAGNOSIS — G8929 Other chronic pain: Secondary | ICD-10-CM

## 2024-02-03 DIAGNOSIS — M47814 Spondylosis without myelopathy or radiculopathy, thoracic region: Secondary | ICD-10-CM | POA: Diagnosis not present

## 2024-02-03 DIAGNOSIS — Z9181 History of falling: Secondary | ICD-10-CM

## 2024-02-03 DIAGNOSIS — M419 Scoliosis, unspecified: Secondary | ICD-10-CM | POA: Diagnosis not present

## 2024-02-03 LAB — COMPREHENSIVE METABOLIC PANEL WITH GFR
ALT: 33 U/L (ref 0–35)
AST: 20 U/L (ref 0–37)
Albumin: 4 g/dL (ref 3.5–5.2)
Alkaline Phosphatase: 54 U/L (ref 39–117)
BUN: 25 mg/dL — ABNORMAL HIGH (ref 6–23)
CO2: 23 meq/L (ref 19–32)
Calcium: 8.9 mg/dL (ref 8.4–10.5)
Chloride: 105 meq/L (ref 96–112)
Creatinine, Ser: 1.06 mg/dL (ref 0.40–1.20)
GFR: 48.19 mL/min — ABNORMAL LOW (ref >60.00)
Glucose, Bld: 122 mg/dL — ABNORMAL HIGH (ref 70–99)
Potassium: 3.4 meq/L — ABNORMAL LOW (ref 3.5–5.1)
Sodium: 140 meq/L (ref 135–145)
Total Bilirubin: 0.8 mg/dL (ref 0.2–1.2)
Total Protein: 6.7 g/dL (ref 6.0–8.3)

## 2024-02-03 LAB — CK: Total CK: 167 U/L (ref 17–177)

## 2024-02-03 NOTE — Progress Notes (Signed)
 Gina Ileana Collet, PhD, LAT, ATC acting as a scribe for Artist Lloyd, MD.  [name] is a 83 y.o. female who presents to [facility] at [facility] today for multiple arthralgias/myalgias. Pt was last seen by Dr. Lloyd on 12/31/23 for bilat knee pain.  Today, pt reports she suffered a fall Thursday night in her bathroom and laid on the floor for 12 hours until help arrived. Pt locates pain to all over her body; bilat knees, thoracic back/L periscapular region, bilat shoulders. She notes she did not hit her head. She thinks her arms are sore from trying to crawl across the floor.    Pertinent review of systems: No fevers or chills  Relevant historical information: Hypertension and diabetes.   Exam:  BP 124/84   Pulse 93   Ht 4' 9 (1.448 m)   Wt 161 lb (73 kg)   SpO2 98%   BMI 34.84 kg/m  General: Well Developed, well nourished, and in no acute distress.   MSK: Knees bilaterally tender to palpation.  Normal motion intact strength.  Slow gait using a walker to ambulate.    Lab and Radiology Results No results found for this or any previous visit (from the past 72 hours). DG Thoracic Spine W/Swimmers Result Date: 02/03/2024 CLINICAL DATA:  Thoracic back pain. Patient is status post fall on January 30, 2024. EXAM: THORACIC SPINE - 3 VIEWS COMPARISON:  Lumbar x-ray Aug 08, 2020 FINDINGS: There is no evidence of thoracic spine fracture. Scoliosis. Moderate degenerative joint changes with narrow intervertebral spaces and anterior spurring noted throughout the thoracic spine. Prior cholecystectomy. IMPRESSION: No acute fracture or dislocation. Degenerative joint changes of thoracic spine. Electronically Signed   By: Craig Farr M.D.   On: 02/03/2024 11:08  I, Artist Lloyd, personally (independently) visualized and performed the interpretation of the images attached in this note.   X-ray images bilateral knees obtained today personally independently  interpreted. Moderate degenerative changes.  No acute fractures. Await formal radiology review   Assessment and Plan: 83 y.o. female with acute exacerbation of knee and upper back and arm pain after a fall resulting in 12 hours on the floor before she was rescued.  This injury occurred Thursday, November 13.  Plan today for x-rays as above and metabolic assessment looking for rhabdomyolysis.  We talked about pain management.  She would like to avoid opiates noting that it causes her to have confusion and lethargy.  Tylenol  is not sufficient.  Ideally we would use some sort of NSAID like meloxicam but I would like to check her kidney function first.  Plan to recheck next week.  If significantly worsening will consider the emergency room.   PDMP reviewed during this encounter. Orders Placed This Encounter  Procedures   DG Knee AP/LAT W/Sunrise Left    Standing Status:   Future    Number of Occurrences:   1    Expiration Date:   03/04/2024    Reason for Exam (SYMPTOM  OR DIAGNOSIS REQUIRED):   bilateral knee pain    Preferred imaging location?:   [facility]   DG Knee AP/LAT W/Sunrise Right    Standing Status:   Future    Number of Occurrences:   1    Expiration Date:   03/04/2024    Reason for Exam (SYMPTOM  OR DIAGNOSIS REQUIRED):   bilateral knee pain    Preferred imaging location?:   [facility] Thoracic Spine W/Swimmers  Standing Status:   Future    Number of Occurrences:   1    Expiration Date:   02/02/2025    Reason for Exam (SYMPTOM  OR DIAGNOSIS REQUIRED):   thoracic back pain    Preferred imaging location?:   [facility]   Comprehensive metabolic panel with GFR    Osteoporis    Standing Status:   Future    Number of Occurrences:   1    Expiration Date:   02/02/2025   CK (Creatine Kinase)   No orders of the defined types were placed in this encounter.    Discussed warning signs or symptoms. Please see discharge instructions. Patient  expresses understanding.   The above documentation has been reviewed and is accurate and complete Artist Lloyd, M.D.

## 2024-02-03 NOTE — Patient Instructions (Addendum)
 Thank you for coming in today.   Please get labs today before you leave   Check back next week

## 2024-02-04 ENCOUNTER — Other Ambulatory Visit (HOSPITAL_COMMUNITY): Payer: Self-pay

## 2024-02-04 ENCOUNTER — Ambulatory Visit: Payer: Self-pay | Admitting: Family Medicine

## 2024-02-04 ENCOUNTER — Other Ambulatory Visit: Payer: Self-pay

## 2024-02-04 MED ORDER — MELOXICAM 15 MG PO TABS
15.0000 mg | ORAL_TABLET | Freq: Every day | ORAL | 0 refills | Status: DC | PRN
  Filled 2024-02-04: qty 15, 15d supply, fill #0

## 2024-02-04 MED ORDER — EMPAGLIFLOZIN 10 MG PO TABS
10.0000 mg | ORAL_TABLET | Freq: Every day | ORAL | 0 refills | Status: AC
  Filled 2024-02-04: qty 90, 90d supply, fill #0

## 2024-02-04 NOTE — Progress Notes (Signed)
 Thoracic spine x-ray shows some arthritis changes.  No broken bones are visible.

## 2024-02-04 NOTE — Progress Notes (Signed)
 Kidney function looks okay.  I have prescribed meloxicam which could be quite helpful for pain.  Use it sparingly daily as needed along with Tylenol .

## 2024-02-05 NOTE — Progress Notes (Signed)
 Right knee x-ray no fractures.

## 2024-02-05 NOTE — Progress Notes (Signed)
Left knee x-ray shows no fractures.

## 2024-02-11 ENCOUNTER — Ambulatory Visit: Admitting: Family Medicine

## 2024-02-11 NOTE — Progress Notes (Deleted)
   LILLETTE Ileana Collet, PhD, LAT, ATC acting as a scribe for Artist Lloyd, MD.  Chea Bartko is a 83 y.o. female who presents to [facility] at [facility] today for f/u bilat knee pain. Pt was last seen by Dr. Lloyd on 02/03/24 after suffering a fall on the 13th. Imaging and labs were obtained and she was advised to go to the ED if anything worsened.  Today, pt reports ***  Pertinent review of systems: ***  Relevant historical information: ***   Exam:  There were no vitals taken for this visit. General: Well Developed, well nourished, and in no acute distress.   MSK: ***    Lab and Radiology Results No results found for this or any previous visit (from the past 72 hours). No results found.     Assessment and Plan: 83 y.o. female with ***   PDMP not reviewed this encounter. No orders of the defined types were placed in this encounter.  No orders of the defined types were placed in this encounter.    Discussed warning signs or symptoms. Please see discharge instructions. Patient expresses understanding.   ***

## 2024-02-12 NOTE — Telephone Encounter (Signed)
 Patient called back to get result notes. Went over all the result notes attached to this message. Patient stated that she understood. Wanted everyone to know that she loves us  and to have a Happy Thanksgiving and Merry Christmas.

## 2024-02-15 ENCOUNTER — Other Ambulatory Visit: Payer: Self-pay | Admitting: Internal Medicine

## 2024-02-15 DIAGNOSIS — I5032 Chronic diastolic (congestive) heart failure: Secondary | ICD-10-CM

## 2024-02-17 ENCOUNTER — Telehealth: Payer: Self-pay | Admitting: Physician Assistant

## 2024-02-17 ENCOUNTER — Other Ambulatory Visit (HOSPITAL_COMMUNITY): Payer: Self-pay

## 2024-02-17 MED ORDER — NITROGLYCERIN 0.4 MG SL SUBL: 0.4000 mg | SUBLINGUAL_TABLET | SUBLINGUAL | 0 refills | Status: AC | PRN

## 2024-02-17 NOTE — Telephone Encounter (Signed)
 [name](Self)   PH: [phone]  Reason for call: LVM stating that she took a bad fall on Nov. 13, 2025, and would like a call back.

## 2024-02-17 NOTE — Telephone Encounter (Signed)
 Tried to call patient, phone just rang. If she calls back could you get further information regading this. I will call back in clinic now.

## 2024-02-17 NOTE — Telephone Encounter (Signed)
 I left detailed message to call the office back regarding falls.

## 2024-02-18 ENCOUNTER — Ambulatory Visit: Payer: Self-pay

## 2024-02-18 ENCOUNTER — Institutional Professional Consult (permissible substitution): Admitting: Psychology

## 2024-02-19 ENCOUNTER — Ambulatory Visit

## 2024-02-25 ENCOUNTER — Encounter: Admitting: Psychology

## 2024-02-27 ENCOUNTER — Ambulatory Visit (INDEPENDENT_AMBULATORY_CARE_PROVIDER_SITE_OTHER)

## 2024-02-27 VITALS — Ht <= 58 in | Wt 161.0 lb

## 2024-02-27 DIAGNOSIS — H9193 Unspecified hearing loss, bilateral: Secondary | ICD-10-CM

## 2024-02-27 DIAGNOSIS — Z0000 Encounter for general adult medical examination without abnormal findings: Secondary | ICD-10-CM | POA: Diagnosis not present

## 2024-02-27 NOTE — Patient Instructions (Signed)
 [name],  Thank you for taking the time for your Medicare Wellness Visit. I appreciate your continued commitment to your health goals. Please review the care plan we discussed, and feel free to reach out if I can assist you further.  Please note that Annual Wellness Visits do not include a physical exam. Some assessments may be limited, especially if the visit was conducted virtually. If needed, we may recommend an in-person follow-up with your provider.  Ongoing Care Seeing your primary care provider every 3 to 6 months helps us  monitor your health and provide consistent, personalized care.   Referrals If a referral was made during today's visit and you haven't received any updates within two weeks, please contact the referred provider directly to check on the status.  Recommended Screenings:  Health Maintenance  Topic Date Due   Eye exam for diabetics  01/04/2023   COVID-19 Vaccine (5 - 2025-26 season) 11/18/2023   Medicare Annual Wellness Visit  02/04/2024   DTaP/Tdap/Td vaccine (2 - Tdap) 07/01/2024*   Yearly kidney health urinalysis for diabetes  07/01/2024   Complete foot exam   12/30/2024   Yearly kidney function blood test for diabetes  02/02/2025   Pneumococcal Vaccine for age over 71  Completed   Flu Shot  Completed   Osteoporosis screening with Bone Density Scan  Completed   Zoster (Shingles) Vaccine  Completed   Meningitis B Vaccine  Aged Out  *Topic was postponed. The date shown is not the original due date.       02/27/2024   10:55 AM  Advanced Directives  Does Patient Have a Medical Advance Directive? Yes  Type of Estate Agent of [place];Living will  Does patient want to make changes to medical advance directive? Yes (Inpatient - patient requests chaplain consult to change a medical advance directive)  Copy of Healthcare Power of Attorney in Chart? No - copy requested    Vision: Annual vision screenings are recommended for early  detection of glaucoma, cataracts, and diabetic retinopathy. These exams can also reveal signs of chronic conditions such as diabetes and high blood pressure.  Dental: Annual dental screenings help detect early signs of oral cancer, gum disease, and other conditions linked to overall health, including heart disease and diabetes.

## 2024-02-27 NOTE — Progress Notes (Addendum)
 Chief Complaint  Patient presents with   Medicare Wellness     Subjective:   [name] is a 83 y.o. female who presents for a Medicare Annual Wellness Visit.  Visit info / Clinical Intake: Medicare Wellness Visit Type:: Subsequent Annual Wellness Visit Persons participating in visit and providing information:: patient Medicare Wellness Visit Mode:: Telephone If telephone:: video declined Since this visit was completed virtually, some vitals may be partially provided or unavailable. Missing vitals are due to the limitations of the virtual format.: Documented vitals are patient reported If Telephone or Video please confirm:: I connected with patient using audio/video enable telemedicine. I verified patient identity with two identifiers, discussed telehealth limitations, and patient agreed to proceed. Patient Location:: Home Provider Location:: Office Interpreter Needed?: No Pre-visit prep was completed: yes AWV questionnaire completed by patient prior to visit?: no Living arrangements:: (!) lives alone Patient's Overall Health Status Rating: good Typical amount of pain: none Does pain affect daily life?: no Are you currently prescribed opioids?: no  Dietary Habits and Nutritional Risks How many meals a day?: 3 Eats fruit and vegetables daily?: yes Most meals are obtained by: having others provide food [facility]) In the last 2 weeks, have you had any of the following?: none Diabetic:: (!) yes Any non-healing wounds?: no How often do you check your BS?: as needed Would you like to be referred to a Nutritionist or for Diabetic Management? : no  Functional Status Activities of Daily Living (to include ambulation/medication): (!) Needs Assist Feeding: Independent Dressing/Grooming: Needs assistance (CNA at [facility]) Bathing: Needs assistance (CNA at [facility]) Toileting: Independent Transfer:  Independent Ambulation: Independent with device- listed below Home Assistive Devices/Equipment: Johna Finder (specify Type); Eyeglasses; Dentures (specify type); Oxygen (oxygen - 2-4liters prn) Medication Administration: Independent Home Management (perform basic housework or laundry): Needs assistance (comment) ([facility]) Manage your own finances?: yes Primary transportation is: facility / other Concerns about vision?: no *vision screening is required for [facility]* Concerns about hearing?: (!) yes (referral to [facility]) Uses hearing aids?: no Hear whispered voice?: yes  Fall Screening Falls in the past year?: 1 Number of falls in past year: 1 (2) Was there an injury with Fall?: 0 Fall Risk Category Calculator: 2 Patient Fall Risk Level: Moderate Fall Risk  Fall Risk Patient at Risk for Falls Due to: Impaired balance/gait; History of fall(s) Fall risk Follow up: Falls evaluation completed; Falls prevention discussed  Home and Transportation Safety: All rugs have non-skid backing?: N/A, no rugs All stairs or steps have railings?: N/A, no stairs Grab bars in the bathtub or shower?: yes Have non-skid surface in bathtub or shower?: yes Good home lighting?: yes Regular seat belt use?: yes Hospital stays in the last year:: no  Cognitive Assessment Difficulty concentrating, remembering, or making decisions? : no Will 6CIT or Mini Cog be Completed: yes What year is it?: 0 points What month is it?: 0 points Give patient an address phrase to remember (5 components): [address] [place], [place] About what time is it?: 0 points Count backwards from 20 to 1: 0 points Say the months of the year in reverse: 0 points Repeat the address phrase from earlier: 0 points 6 CIT Score: 0 points  Advance Directives (For Healthcare) Does Patient Have a Medical Advance Directive?: Yes Does patient want to make changes to medical advance directive?: Yes (Inpatient -  patient requests chaplain consult to change a medical advance directive)  Type of Advance Directive: Healthcare Power of [place]; Living will Copy of Healthcare Power of Attorney in Chart?: No - copy requested Copy of Living Will in Chart?: No - copy requested  Reviewed/Updated  Reviewed/Updated: Reviewed All (Medical, Surgical, Family, Medications, Allergies, Care Teams, Patient Goals)    Allergies (verified) Shellfish protein-containing drug products, Amoxicillin , Codeine sulfate, Hydrochlorothiazide, Iodine, Iohexol , Paroxetine, Penicillins, Prednisone , and Red dye #28 (non-screening)   Current Medications (verified) Outpatient Encounter Medications as of 02/27/2024  Medication Sig   acetaminophen  (TYLENOL ) 500 MG tablet Take 500 mg by mouth as needed for mild pain.   albuterol  (VENTOLIN  HFA) 108 (90 Base) MCG/ACT inhaler TAKE 2 PUFFS BY MOUTH EVERY 6 HOURS AS NEEDED FOR WHEEZE OR SHORTNESS OF BREATH   AMBULATORY NON FORMULARY MEDICATION Folding walker with front wheels.  Use as needed. Disp 1 R26.89   Blood Glucose Calibration (ACCU-CHEK GUIDE CONTROL) LIQD 1 Act by In Vitro route 2 (two) times daily as needed.   Blood Glucose Monitoring Suppl (ACCU-CHEK GUIDE ME) w/Device KIT 1 Act by Does not apply route 2 (two) times daily as needed.   carvedilol  (COREG ) 3.125 MG tablet Take 1 tablet (3.125 mg total) by mouth 2 (two) times daily with a meal.   clobetasol  ointment (TEMOVATE ) 0.05 % Apply topically as needed (irritation).   diclofenac  Sodium (VOLTAREN ) 1 % GEL Apply 4 g topically 4 (four) times daily.   empagliflozin  (JARDIANCE ) 10 MG TABS tablet Take 1 tablet (10 mg total) by mouth daily before breakfast.   estradiol (ESTRACE) 0.1 MG/GM vaginal cream Place 1 Applicatorful vaginally daily.   FLUoxetine  (PROZAC ) 40 MG capsule Take 1 capsule (40 mg total) by mouth daily.   glucose blood (ACCU-CHEK GUIDE) test strip 1 each by Other route 2 (two) times daily.   levothyroxine   (SYNTHROID ) 50 MCG tablet Take 1 tablet (50 mcg total) by mouth daily.   losartan  (COZAAR ) 100 MG tablet Take 1 tablet (100 mg total) by mouth daily.   meloxicam  (MOBIC ) 15 MG tablet Take 1 tablet (15 mg total) by mouth daily as needed for pain.   memantine  (NAMENDA  XR) 28 MG CP24 24 hr capsule Take 1 capsule (28 mg total) by mouth daily.   nitroGLYCERIN  (NITROSTAT ) 0.4 MG SL tablet Place 1 tablet (0.4 mg total) under the tongue every 5 (five) minutes as needed for chest pain.   rosuvastatin  (CRESTOR ) 10 MG tablet Take 1 tablet (10 mg total) by mouth daily.   torsemide  (DEMADEX ) 20 MG tablet Take 1 tablet (20 mg total) by mouth daily.   spironolactone  (ALDACTONE ) 25 MG tablet Take 1 tablet (25 mg total) by mouth daily. (Patient not taking: Reported on 02/27/2024)   No facility-administered encounter medications on file as of 02/27/2024.    History: Past Medical History:  Diagnosis Date   Allergic rhinitis    Anxiety    Arthritis    Chronic cough    Dr Darlean, onset 11/2007, Sinus CT July 7,2010>neg, Resolved 10/2008 on ppi two times a day   Colon polyp    Depression    Diabetes mellitus type II    diet   Dyslipidemia    Hiatal hernia    Hypertension    Hypothyroidism    Lung nodule    Obesity (BMI 30-39.9)    Pneumonia    Sleep apnea    Dr Corrie CPAP 2009   Past Surgical History:  Procedure Laterality Date   ABDOMINAL HYSTERECTOMY     APPENDECTOMY     BLADDER  SUSPENSION     BREAST EXCISIONAL BIOPSY Left    over 30 years ago   CHOLECYSTECTOMY     1998 in [place]    EYE SURGERY  12/18/2011   and 12/13; cataract surgery   Left breast lumpectomy     NISSEN FUNDOPLICATION     March '11 Dr Sheldon   RECTOCELE REPAIR     TONSILLECTOMY     x 2   VENTRAL HERNIA REPAIR     Family History  Problem Relation Age of Onset   Stroke Mother 19   Lung cancer Father    Breast cancer Neg Hx    Social History   Occupational History   Occupation: retired engineer, petroleum  Tobacco  Use   Smoking status: Former    Current packs/day: 0.00    Average packs/day: 1.5 packs/day for 25.0 years (37.5 ttl pk-yrs)    Types: Cigarettes    Start date: 10/28/1956    Quit date: 10/28/1981    Years since quitting: 42.3   Smokeless tobacco: Never  Vaping Use   Vaping status: Never Used  Substance and Sexual Activity   Alcohol use: No   Drug use: No   Sexual activity: Not Currently   Tobacco Counseling Counseling given: Yes  SDOH Screenings   Food Insecurity: No Food Insecurity (02/27/2024)  Housing: Unknown (02/27/2024)  Transportation Needs: No Transportation Needs (02/27/2024)  Utilities: Not At Risk (02/27/2024)  Alcohol Screen: Low Risk (02/04/2023)  Depression (PHQ2-9): Low Risk (02/27/2024)  Financial Resource Strain: Low Risk (02/04/2023)  Physical Activity: Inactive (02/27/2024)  Social Connections: Moderately Integrated (02/27/2024)  Stress: No Stress Concern Present (02/27/2024)  Tobacco Use: Medium Risk (02/27/2024)  Health Literacy: Adequate Health Literacy (02/27/2024)   See flowsheets for full screening details  Depression Screen PHQ 2 & 9 Depression Scale- Over the past 2 weeks, how often have you been bothered by any of the following problems? Little interest or pleasure in doing things: 0 Feeling down, depressed, or hopeless (PHQ Adolescent also includes...irritable): 0 PHQ-2 Total Score: 0 Trouble falling or staying asleep, or sleeping too much: 0 Feeling tired or having little energy: 0 Poor appetite or overeating (PHQ Adolescent also includes...weight loss): 0 Feeling bad about yourself - or that you are a failure or have let yourself or your family down: 0 Trouble concentrating on things, such as reading the newspaper or watching television (PHQ Adolescent also includes...like school work): 0 Moving or speaking so slowly that other people could have noticed. Or the opposite - being so fidgety or restless that you have been moving around a lot  more than usual: 3 (uses a walker) Thoughts that you would be better off dead, or of hurting yourself in some way: 0 PHQ-9 Total Score: 3 If you checked off any problems, how difficult have these problems made it for you to do your work, take care of things at home, or get along with other people?: Not difficult at all  Depression Treatment Depression Interventions/Treatment : EYV7-0 Score <4 Follow-up Not Indicated     Goals Addressed               This Visit's Progress     Patient Stated (pt-stated)        Patient stated she plans to continue prevent falls and take meds daily             Objective:    Today's Vitals   02/27/24 1053  Weight: 161 lb (73 kg)  Height: 4' 8.5 (1.435  m)   Body mass index is 35.46 kg/m.  Hearing/Vision screen Hearing Screening - Comments:: Referral to Audiologist Vision Screening - Comments:: Wears rx glasses - up to date with routine eye exams with Lamarr Burkitt  Immunizations and Health Maintenance Health Maintenance  Topic Date Due   OPHTHALMOLOGY EXAM  01/04/2023   COVID-19 Vaccine (5 - 2025-26 season) 11/18/2023   DTaP/Tdap/Td (2 - Tdap) 07/01/2024 (Originally 04/26/2019)   Diabetic kidney evaluation - Urine ACR  07/01/2024   FOOT EXAM  12/30/2024   Diabetic kidney evaluation - eGFR measurement  02/02/2025   Medicare Annual Wellness (AWV)  02/26/2025   Pneumococcal Vaccine: 50+ Years  Completed   Influenza Vaccine  Completed   Bone Density Scan  Completed   Zoster Vaccines- Shingrix   Completed   Meningococcal B Vaccine  Aged Out        Assessment/Plan:  This is a routine wellness examination for Hugh.  Referral to [facility] - pt c/o difficulty hearing  Patient Care Team: Joshua Debby CROME, MD as PCP - General (Internal Medicine) Raford Riggs, MD as PCP - Cardiology (Cardiology)  I have personally reviewed and noted the following in the patients chart:   Medical and social history Use of alcohol, tobacco  or illicit drugs  Current medications and supplements including opioid prescriptions. Functional ability and status Nutritional status Physical activity Advanced directives List of other physicians Hospitalizations, surgeries, and ER visits in previous 12 months Vitals Screenings to include cognitive, depression, and falls Referrals and appointments  Orders Placed This Encounter  Procedures   Ambulatory referral to Audiology    Referral Priority:   Routine    Referral Type:   Audiology Exam    Referral Reason:   Specialty Services Required    Referred to Provider:   Helane Darryle LABOR, AUD    Number of Visits Requested:   1   In addition, I have reviewed and discussed with patient certain preventive protocols, quality metrics, and best practice recommendations. A written personalized care plan for preventive services as well as general preventive health recommendations were provided to patient.   Verdie CHRISTELLA Saba, CMA   02/27/2024   Return in 1 year (on 02/26/2025).  After Visit Summary: (MyChart) Due to this being a telephonic visit, the after visit summary with patients personalized plan was offered to patient via MyChart   Nurse Notes: Appointment(s) made: (scheduled 2026 AWV/CPE appts.)

## 2024-03-17 ENCOUNTER — Ambulatory Visit (INDEPENDENT_AMBULATORY_CARE_PROVIDER_SITE_OTHER): Payer: Self-pay | Admitting: Nurse Practitioner

## 2024-03-24 ENCOUNTER — Telehealth: Payer: Self-pay

## 2024-03-24 NOTE — Telephone Encounter (Signed)
 Zilretta  benfits ran for bilateral knee case ID [phone] [phone]

## 2024-03-24 NOTE — Telephone Encounter (Signed)
 Done and new year note made

## 2024-03-24 NOTE — Telephone Encounter (Signed)
 Re-auth Zilretta , BILAT knees

## 2024-03-25 NOTE — Telephone Encounter (Signed)
 Had to precertify through covermymeds   (KeyBETHA GRASS) - [id number]

## 2024-03-26 ENCOUNTER — Ambulatory Visit: Admitting: Pulmonary Disease

## 2024-03-27 ENCOUNTER — Telehealth: Payer: Self-pay

## 2024-03-27 ENCOUNTER — Other Ambulatory Visit (HOSPITAL_COMMUNITY): Payer: Self-pay

## 2024-03-27 NOTE — Telephone Encounter (Signed)
 Scheduled 04/02/24  ZILRETTA  authorized bilateral knee PRE CERT REQUIRED Patient responsible for 20% coinsurance Copay $15 Deductible does not apply OOP MAX $3200 has met $0 Once OOP has been met coverage goes to 100% and copay will no longer apply AUTHORIZATION # [id number] 03/25/24-03/25/25

## 2024-03-27 NOTE — Telephone Encounter (Signed)
 Copied from CRM [id number]. Topic: Clinical - Medication Question >> Mar 27, 2024  4:03 PM Viola F wrote: Reason for CRM: Patient requested a call back from [facility] regarding the Memantine  28MG /medication that helps with Alzheimer's and dementia - please call her at [phone]

## 2024-03-29 ENCOUNTER — Other Ambulatory Visit: Payer: Self-pay | Admitting: Internal Medicine

## 2024-03-29 DIAGNOSIS — I5032 Chronic diastolic (congestive) heart failure: Secondary | ICD-10-CM

## 2024-03-29 DIAGNOSIS — I10 Essential (primary) hypertension: Secondary | ICD-10-CM

## 2024-03-30 ENCOUNTER — Other Ambulatory Visit (HOSPITAL_COMMUNITY): Payer: Self-pay

## 2024-03-30 ENCOUNTER — Other Ambulatory Visit: Payer: Self-pay

## 2024-03-30 NOTE — Telephone Encounter (Signed)
 Noted

## 2024-03-30 NOTE — Telephone Encounter (Signed)
 Unable to reach patient. LMTRC

## 2024-03-31 ENCOUNTER — Other Ambulatory Visit: Payer: Self-pay

## 2024-03-31 ENCOUNTER — Ambulatory Visit (INDEPENDENT_AMBULATORY_CARE_PROVIDER_SITE_OTHER): Payer: Self-pay | Admitting: Nurse Practitioner

## 2024-03-31 ENCOUNTER — Ambulatory Visit: Payer: Medicare (Managed Care) | Attending: Audiologist | Admitting: Audiologist

## 2024-04-01 ENCOUNTER — Other Ambulatory Visit: Payer: Self-pay

## 2024-04-01 ENCOUNTER — Other Ambulatory Visit (HOSPITAL_COMMUNITY): Payer: Self-pay

## 2024-04-01 MED ORDER — CARVEDILOL 3.125 MG PO TABS
3.1250 mg | ORAL_TABLET | Freq: Two times a day (BID) | ORAL | 0 refills | Status: AC
  Filled 2024-04-01: qty 180, 90d supply, fill #0

## 2024-04-02 ENCOUNTER — Encounter: Payer: Self-pay | Admitting: Internal Medicine

## 2024-04-02 ENCOUNTER — Encounter: Payer: Self-pay | Admitting: Family Medicine

## 2024-04-02 ENCOUNTER — Ambulatory Visit: Admitting: Family Medicine

## 2024-04-02 ENCOUNTER — Ambulatory Visit: Payer: Medicare (Managed Care) | Admitting: Internal Medicine

## 2024-04-02 VITALS — BP 128/84 | HR 78 | Temp 98.3°F | Resp 16 | Wt 162.0 lb

## 2024-04-02 VITALS — Ht <= 58 in

## 2024-04-02 DIAGNOSIS — E1122 Type 2 diabetes mellitus with diabetic chronic kidney disease: Secondary | ICD-10-CM | POA: Diagnosis not present

## 2024-04-02 DIAGNOSIS — E11610 Type 2 diabetes mellitus with diabetic neuropathic arthropathy: Secondary | ICD-10-CM | POA: Diagnosis not present

## 2024-04-02 DIAGNOSIS — I5032 Chronic diastolic (congestive) heart failure: Secondary | ICD-10-CM

## 2024-04-02 DIAGNOSIS — G8929 Other chronic pain: Secondary | ICD-10-CM | POA: Diagnosis not present

## 2024-04-02 DIAGNOSIS — I10 Essential (primary) hypertension: Secondary | ICD-10-CM

## 2024-04-02 DIAGNOSIS — M25562 Pain in left knee: Secondary | ICD-10-CM

## 2024-04-02 DIAGNOSIS — F02B0 Dementia in other diseases classified elsewhere, moderate, without behavioral disturbance, psychotic disturbance, mood disturbance, and anxiety: Secondary | ICD-10-CM | POA: Diagnosis not present

## 2024-04-02 DIAGNOSIS — M170 Bilateral primary osteoarthritis of knee: Secondary | ICD-10-CM

## 2024-04-02 DIAGNOSIS — R42 Dizziness and giddiness: Secondary | ICD-10-CM

## 2024-04-02 DIAGNOSIS — G301 Alzheimer's disease with late onset: Secondary | ICD-10-CM | POA: Diagnosis not present

## 2024-04-02 DIAGNOSIS — J9611 Chronic respiratory failure with hypoxia: Secondary | ICD-10-CM | POA: Diagnosis not present

## 2024-04-02 DIAGNOSIS — M25561 Pain in right knee: Secondary | ICD-10-CM

## 2024-04-02 DIAGNOSIS — R0602 Shortness of breath: Secondary | ICD-10-CM

## 2024-04-02 DIAGNOSIS — N1832 Chronic kidney disease, stage 3b: Secondary | ICD-10-CM | POA: Diagnosis not present

## 2024-04-02 LAB — URINALYSIS, ROUTINE W REFLEX MICROSCOPIC
Bilirubin Urine: NEGATIVE
Hgb urine dipstick: NEGATIVE
Ketones, ur: NEGATIVE
Leukocytes,Ua: NEGATIVE
Nitrite: NEGATIVE
RBC / HPF: NONE SEEN
Specific Gravity, Urine: 1.01 (ref 1.000–1.030)
Total Protein, Urine: NEGATIVE
Urine Glucose: >=1000 — AB
Urobilinogen, UA: 0.2 (ref 0.0–1.0)
WBC, UA: NONE SEEN
pH: 6.5 (ref 5.0–8.0)

## 2024-04-02 LAB — BASIC METABOLIC PANEL WITH GFR
BUN: 14 mg/dL (ref 6–23)
CO2: 25 meq/L (ref 19–32)
Calcium: 9.9 mg/dL (ref 8.4–10.5)
Chloride: 101 meq/L (ref 96–112)
Creatinine, Ser: 1.09 mg/dL (ref 0.40–1.20)
GFR: 46.55 mL/min — ABNORMAL LOW
Glucose, Bld: 104 mg/dL — ABNORMAL HIGH (ref 70–99)
Potassium: 3.7 meq/L (ref 3.5–5.1)
Sodium: 141 meq/L (ref 135–145)

## 2024-04-02 LAB — CBC WITH DIFFERENTIAL/PLATELET
Basophils Absolute: 0 K/uL (ref 0.0–0.1)
Basophils Relative: 0.4 % (ref 0.0–3.0)
Eosinophils Absolute: 0.1 K/uL (ref 0.0–0.7)
Eosinophils Relative: 1 % (ref 0.0–5.0)
HCT: 43 % (ref 36.0–46.0)
Hemoglobin: 14.4 g/dL (ref 12.0–15.0)
Lymphocytes Relative: 23.8 % (ref 12.0–46.0)
Lymphs Abs: 1.9 K/uL (ref 0.7–4.0)
MCHC: 33.6 g/dL (ref 30.0–36.0)
MCV: 93.3 fl (ref 78.0–100.0)
Monocytes Absolute: 0.9 K/uL (ref 0.1–1.0)
Monocytes Relative: 11.4 % (ref 3.0–12.0)
Neutro Abs: 5 K/uL (ref 1.4–7.7)
Neutrophils Relative %: 63.4 % (ref 43.0–77.0)
Platelets: 245 K/uL (ref 150.0–400.0)
RBC: 4.61 Mil/uL (ref 3.87–5.11)
RDW: 14.7 % (ref 11.5–15.5)
WBC: 7.9 K/uL (ref 4.0–10.5)

## 2024-04-02 LAB — HEMOGLOBIN A1C: Hgb A1c MFr Bld: 6.4 % (ref 4.6–6.5)

## 2024-04-02 NOTE — Progress Notes (Signed)
 "  Subjective:  Patient ID: [name], female    DOB: 1941/01/24  Age: 84 y.o. MRN: [id number]  CC: Hypertension, Hypothyroidism, Diabetes, and Osteoarthritis   HPI [name] presents for f/up ---   Discussed the use of AI scribe software for clinical note transcription with the patient, who gave verbal consent to proceed.  History of Present Illness [name] is an 84 year old female who presents with shortness of breath and a productive cough.  She has been experiencing shortness of breath today, which is a new symptom for her. There is no associated chest pain. She recalls a fall on January 30, 2024, after which x-rays were reportedly normal. She also has a history of sciatica with pain radiating down her leg.  She has had a productive cough for over a year, producing clear, heavy mucus. This symptom began after a hospitalization over a year ago. She denies any blood in the sputum and describes the mucus as thick and heavy, similar to when one has pneumonia.  She mentions being prescribed a new diuretic, which she refers to as a 'water pill', and expresses uncertainty about the need for nitroglycerin . She has received RSV and COVID vaccinations.     Outpatient Medications Prior to Visit  Medication Sig Dispense Refill   acetaminophen  (TYLENOL ) 500 MG tablet Take 500 mg by mouth as needed for mild pain.     albuterol  (VENTOLIN  HFA) 108 (90 Base) MCG/ACT inhaler TAKE 2 PUFFS BY MOUTH EVERY 6 HOURS AS NEEDED FOR WHEEZE OR SHORTNESS OF BREATH 8.5 each 0   AMBULATORY NON FORMULARY MEDICATION Folding walker with front wheels.  Use as needed. Disp 1 R26.89 1 each 0   Blood Glucose Calibration (ACCU-CHEK GUIDE CONTROL) LIQD 1 Act by In Vitro route 2 (two) times daily as needed. 1 each 3   Blood Glucose Monitoring Suppl (ACCU-CHEK GUIDE ME) w/Device KIT 1 Act by Does not apply route 2 (two) times daily as needed. 2 kit 2   carvedilol  (COREG ) 3.125 MG tablet Take 1 tablet  (3.125 mg total) by mouth 2 (two) times daily with a meal. 180 tablet 0   clobetasol  ointment (TEMOVATE ) 0.05 % Apply topically as needed (irritation).     diclofenac  Sodium (VOLTAREN ) 1 % GEL Apply 4 g topically 4 (four) times daily. 100 g 5   empagliflozin  (JARDIANCE ) 10 MG TABS tablet Take 1 tablet (10 mg total) by mouth daily before breakfast. 90 tablet 0   estradiol (ESTRACE) 0.1 MG/GM vaginal cream Place 1 Applicatorful vaginally daily.     FLUoxetine  (PROZAC ) 40 MG capsule Take 1 capsule (40 mg total) by mouth daily. 90 capsule 0   glucose blood (ACCU-CHEK GUIDE) test strip 1 each by Other route 2 (two) times daily. 300 each 1   levothyroxine  (SYNTHROID ) 50 MCG tablet Take 1 tablet (50 mcg total) by mouth daily. 100 tablet 0   losartan  (COZAAR ) 100 MG tablet Take 1 tablet (100 mg total) by mouth daily. 90 tablet 0   meloxicam  (MOBIC ) 15 MG tablet Take 1 tablet (15 mg total) by mouth daily as needed for pain. 15 tablet 0   memantine  (NAMENDA  XR) 28 MG CP24 24 hr capsule Take 1 capsule (28 mg total) by mouth daily. 90 capsule 1   nitroGLYCERIN  (NITROSTAT ) 0.4 MG SL tablet Place 1 tablet (0.4 mg total) under the tongue every 5 (five) minutes as needed for chest pain. 50 tablet 0   rosuvastatin  (CRESTOR ) 10 MG tablet Take 1 tablet (10  mg total) by mouth daily. 100 tablet 0   torsemide  (DEMADEX ) 20 MG tablet Take 1 tablet (20 mg total) by mouth daily. 90 tablet 0   spironolactone  (ALDACTONE ) 25 MG tablet Take 1 tablet (25 mg total) by mouth daily. (Patient not taking: Reported on 04/02/2024) 90 tablet 0   No facility-administered medications prior to visit.    ROS Review of Systems  Constitutional:  Positive for fatigue. Negative for appetite change, chills and diaphoresis.  HENT: Negative.    Respiratory:  Positive for cough and shortness of breath. Negative for chest tightness and wheezing.   Cardiovascular:  Negative for chest pain, palpitations and leg swelling.  Gastrointestinal:  Negative.  Negative for abdominal pain, diarrhea, nausea and vomiting.  Genitourinary: Negative.  Negative for difficulty urinating and dysuria.  Musculoskeletal:  Positive for arthralgias and gait problem. Negative for myalgias and neck pain.  Neurological:  Positive for dizziness, tremors, weakness and light-headedness. Negative for seizures and headaches.  Psychiatric/Behavioral:  Positive for confusion and decreased concentration. Negative for behavioral problems, dysphoric mood, sleep disturbance and suicidal ideas.     Objective:  BP 128/84 (BP Location: Right Arm, Patient Position: Sitting, Cuff Size: Normal)   Pulse 78   Temp 98.3 F (36.8 C) (Oral)   Resp 16   Wt 162 lb (73.5 kg)   SpO2 95%   BMI 35.68 kg/m   BP Readings from Last 3 Encounters:  04/02/24 128/84  02/03/24 124/84  12/31/23 (!) 148/84    Wt Readings from Last 3 Encounters:  04/02/24 162 lb (73.5 kg)  02/27/24 161 lb (73 kg)  02/03/24 161 lb (73 kg)    Physical Exam Vitals reviewed.  Constitutional:      General: She is not in acute distress.    Appearance: She is ill-appearing (in a wheelchair). She is not toxic-appearing or diaphoretic.  HENT:     Mouth/Throat:     Mouth: Mucous membranes are moist.  Eyes:     General: No scleral icterus.    Conjunctiva/sclera: Conjunctivae normal.  Cardiovascular:     Rate and Rhythm: Normal rate and regular rhythm.     Heart sounds: No murmur heard.    No friction rub. No gallop.     Comments: EKG--- NSR, 70 bpm  Inferior infarct pattern is not new No LVH Unchanged   Pulmonary:     Effort: Pulmonary effort is normal.     Breath sounds: No stridor. No wheezing, rhonchi or rales.  Abdominal:     General: Abdomen is protuberant.     Palpations: There is no hepatomegaly, splenomegaly or mass.     Tenderness: There is no abdominal tenderness. There is no guarding.     Hernia: No hernia is present.  Musculoskeletal:     Cervical back: Neck supple.      Right lower leg: No edema.     Left lower leg: No edema.  Lymphadenopathy:     Cervical: No cervical adenopathy.  Skin:    General: Skin is warm and dry.  Neurological:     Mental Status: She is alert. Mental status is at baseline.  Psychiatric:        Attention and Perception: She is inattentive.        Mood and Affect: Mood and affect normal.        Speech: Speech is delayed and tangential.        Behavior: Behavior normal. Behavior is cooperative.        Thought Content:  Thought content normal.        Cognition and Memory: Cognition is impaired. Memory is impaired. She exhibits impaired recent memory and impaired remote memory.        Judgment: Judgment normal.     Lab Results  Component Value Date   WBC 7.9 04/02/2024   HGB 14.4 04/02/2024   HCT 43.0 04/02/2024   PLT 245.0 04/02/2024   GLUCOSE 104 (H) 04/02/2024   CHOL 175 12/31/2023   TRIG 67.0 12/31/2023   HDL 84.40 12/31/2023   LDLDIRECT 120.6 11/24/2012   LDLCALC 77 12/31/2023   ALT 33 02/03/2024   AST 20 02/03/2024   NA 141 04/02/2024   K 3.7 04/02/2024   CL 101 04/02/2024   CREATININE 1.09 04/02/2024   BUN 14 04/02/2024   CO2 25 04/02/2024   TSH 6.49 (H) 12/31/2023   INR 1.0 11/20/2021   HGBA1C 6.4 04/02/2024   MICROALBUR 1.6 07/02/2023    MM 3D DIAGNOSTIC MAMMOGRAM BILATERAL BREAST Result Date: 01/07/2024 CLINICAL DATA:  Patient presents with bilateral breast pain localized along the lateral aspects of both breasts. No reported lumps. EXAM: DIGITAL DIAGNOSTIC BILATERAL MAMMOGRAM WITH TOMOSYNTHESIS AND CAD TECHNIQUE: Bilateral digital diagnostic mammography and breast tomosynthesis was performed. The images were evaluated with computer-aided detection. COMPARISON:  Previous exam(s). ACR Breast Density Category c: The breasts are heterogeneously dense, which may obscure small masses. FINDINGS: There are no suspicious masses, areas of significant asymmetry, areas of architectural distortion or suspicious  calcifications. IMPRESSION: 1. No evidence of breast malignancy. RECOMMENDATION: Screening mammogram in one year.(Code:SM-B-01Y) I have discussed the findings and recommendations with the patient. If applicable, a reminder letter will be sent to the patient regarding the next appointment. BI-RADS CATEGORY  1: Negative. Electronically Signed   By: Alm Parkins M.D.   On: 01/07/2024 13:49    Estimated Creatinine Clearance: 31.5 mL/min (by C-G formula based on SCr of 1.09 mg/dL).   Assessment & Plan:   SOB (shortness of breath)- EKG is reassuring. -     EKG 12-Lead  Chronic heart failure with preserved ejection fraction (HCC)- No signs of fluid excess. -     CBC with Differential/Platelet; Future  Chronic hypoxemic respiratory failure (HCC) -     CBC with Differential/Platelet; Future -     Ambulatory referral to Hospice  Moderate late onset Alzheimer's dementia without behavioral disturbance, psychotic disturbance, mood disturbance, or anxiety (HCC) -     Ambulatory referral to Hospice  Essential hypertension- BP is well controlled. -     Basic metabolic panel with GFR; Future -     Urinalysis, Routine w reflex microscopic; Future -     CBC with Differential/Platelet; Future  Type 2 diabetes mellitus with diabetic neuropathic arthropathy, without long-term current use of insulin  (HCC)- Blood sugar is well controlled. -     Hemoglobin A1c; Future  Stage 3b chronic kidney disease (HCC)- Will avoid nephrotoxic agents      Follow-up: Return in about 3 months (around 07/01/2024).  Debby Molt, MD "

## 2024-04-02 NOTE — Patient Instructions (Signed)

## 2024-04-02 NOTE — Progress Notes (Signed)
"       ° °  I, Leotis Batter, CMA acting as a scribe for Artist Lloyd, MD.  [name] is a 84 y.o. female who presents to [facility] at [facility] today for exacerbation of her bilateral knee pain. Pt was last seen by Dr. Lloyd on 02/03/24 after suffering a fall and being stranded on the bathroom floor.  Last bilat Zilretta  injections, 12/31/23.  She notes she has been feeling poorly since then but worsened this morning.  She feels dizzy and lightheaded.  She denies chest pain.  She has an appointment immediately following this appointment with her primary care provider.   Dx imaging: 02/03/24 R & L knee XR 08/08/20 R knee XR             01/16/20 L knee XR  Pertinent review of systems: Lightheadedness and dizziness.  Relevant historical information: Hypertension.  Chronic heart failure with preserved ejection fracture.  Chronic respiratory failure diabetes.   Exam:  Ht 4' 8.5 (1.435 m)   BMI 35.46 kg/m  General: Anxious appearing woman in no acute distress. Heart and lungs: Slight increased respiratory rate.  No increased work of breathing.  Able to complete sentences.   MSK: Antalgic gait using a walker to ambulate.    Lab and Radiology Results No results found for this or any previous visit (from the past 72 hours). No results found.     Assessment and Plan: 84 y.o. female with fatigue lightheadedness and dizziness.  Patient originally was scheduled today for evaluation of her knees and potential Zilretta  injections.  After discussion we will not proceed with Zilretta  steroid injections and we will reschedule that for next week.  Patient will attend her appoint with her primary care provider immediately following this visit in the same building.   PDMP not reviewed this encounter. No orders of the defined types were placed in this encounter.  No orders of the defined types were placed in this encounter.    Discussed warning signs or symptoms. Please see  discharge instructions. Patient expresses understanding.   The above documentation has been reviewed and is accurate and complete Artist Lloyd, M.D.   "

## 2024-04-03 DIAGNOSIS — N1832 Chronic kidney disease, stage 3b: Secondary | ICD-10-CM | POA: Insufficient documentation

## 2024-04-03 DIAGNOSIS — E11610 Type 2 diabetes mellitus with diabetic neuropathic arthropathy: Secondary | ICD-10-CM | POA: Insufficient documentation

## 2024-04-03 NOTE — Telephone Encounter (Signed)
"  Patient was seen in office yesterday.  "

## 2024-04-06 ENCOUNTER — Other Ambulatory Visit (HOSPITAL_COMMUNITY): Payer: Self-pay

## 2024-04-06 ENCOUNTER — Other Ambulatory Visit: Payer: Self-pay

## 2024-04-06 NOTE — Progress Notes (Signed)
 "    Dementia likely due to Alzheimer's disease   [name] is a very pleasant 84 y.o. RH female with a history of  hypertension, hyperlipidemia, depression, DM2, CHF with chronic hypoxemic respiratory failure CKD,, hypothyroidism, OSA, chronic pain, HOH, junctional bradycardia, and a diagnosis of mild late onset Alzheimer's dementia while living in [place]  3 years ago.  She is seen today in follow up for memory loss. Patient is currently on memantine  28 mg XR daily tolerating it well.   Patient was last seen on 07/23/2023. Memory is quite stable, with MMSE 29/30.  No indication to add a second agent such as ACHI at this time, especially with her prior history of junctional bradycardia but with normal current pulse.  If memory decline continues, then we will start Aricept  at the low dose with close monitoring.  Patient is able to participate on ADLs, no longer drives. Mood is good. This patient is here with her niece who supplements the history.  Previous records as well as any outside records available were reviewed prior to todays visit.   Follow up in 6 months Continue to memantine  XR 28 mg daily as PCP, side effects discussed Recommend good control of cardiovascular risk factors.   Continue to control mood as per PCP   Discussed the use of AI scribe software for clinical note transcription with the patient, who gave verbal consent to proceed.  History of Present Illness [name] is an 84 year old female who presents with memory loss  She has not noticed significant changes in her memory since her last visit in May of the previous year. She is currently taking memantine  28 mg XR daily for memory support. She lives independently in independent living at [facility].  She often sleeps during the day and wakes up at night. No frequent dreams, nightmares, hallucinations, or seizures. She occasionally experiences irritability and depression, which she attributes to aging, and prefers  limited social interaction, opting instead to engage in reading, writing, and playing games like Mahjong and solitaire.  On November 13th, she experienced a mechanical fall and was on the floor for twelve hours before receiving assistance.  No LOC or any head injuries.  She did not sustain any fractures but visited her orthopedic doctor five days later for knee pain related to arthritis, where she received injections for her knees. She expresses fear of showering since her fall, preferring to wash up instead.  She uses oxygen  occasionally at night and manages her own medications and finances. Her appetite remains strong, and she uses yogurt to help swallow her pills, as water often causes them to come back up. She occasionally experiences sinus headaches but denies any recent strokes or stroke symptoms. For chest discomfort, she takes nitroglycerin . No frequent tremors.  She has some urine incontinence and uses pull-ups as a precaution against accidents, with no chronic bowel issues.  No longer drives.  Initial visit 07/23/2023 How long did patient have memory difficulties?  I think for about 2 years when she thought it was due to old age .  She was diagnosed in [place]  about 2 years ago, but since having COVID, RSV and pneumonia in October 2024, she reports mild worsening of her symptoms. Patient has some difficulty remembering new information, recent conversations and names of people but was again, she attributes it to being 84 years old.  She enjoys praying and listening to music.   I am and only child so I am used to entertaining myself-she  says repeats oneself?  Endorsed Disoriented when walking into a room?  Denies   Leaving objects in unusual places?   Denies.  Wandering behavior? Denies.   Any personality changes, or depression, anxiety?  Has moments of irritability, and she admits to feeling depressed, feeling old .  She avoids social interaction despite living in [place], eating  meals in her room rather than spending some time with other residents which irritates her. Hallucinations or paranoia?  Denies.   Seizures? Denies.    Any sleep changes?  Sleeps well .  She reports (and is laughing about it) occasional wet dreams.  Denies  REM behavior or sleepwalking.   Sleep apnea? Denies, but  uses O2 at night .   Any hygiene concerns?  Denies.   Independent of bathing and dressing?  Needs assistance getting dressed. Who is in charge of the medications?  Patient is in charge Who is in charge of the finances?   Patient is in charge    Any changes in appetite?   Her appetite is too good, I am always hungry.    Patient have trouble swallowing?  Denies.   Does the patient cook?  No   Any headaches?  Sometimes , she attributes it to sinus headache on the left frontal area, there is no nausea or vomiting, no photophobia, this is not frequent or debilitating. Chronic back pain?  Denies.   Ambulates with difficulty? Denies, does not like to walk outside because of allergies Recent falls or head injuries?  Denies  Any tremors?  Denies stroke like symptoms?  Denies.   Any tremors?  Denies.   Any anosmia?  Denies.   Any incontinence of urine? Denies. Uses diapers because they give it  to me, everyone wears pull-ups  Any bowel dysfunction? Denies.      Patient lives at [facility].  While living in [place] , she states that she moved from there because there was mold in the apartment.    History of heavy alcohol intake? Denies.   History of heavy tobacco use? Denies.   Family history of dementia?   She was adopted Family history is unknown.  Does patient drive? No longer drives   Latest labs April 2025 TSH 2.3, A1c 6.2   Retired from [employer] entry in 1998, they forced me to retire    MRI of the brain  (performed on 07/18/2023), but he has been personally reviewed remarkable for cerebral and hippocampal atrophy, mild chronic ischemic changes, no acute  findings         04/07/2024   12:00 PM 11/29/2014    1:20 PM  MMSE - Mini Mental State Exam  Orientation to time 5 5   Orientation to Place 5 5   Registration 3 3   Attention/ Calculation 5 5   Recall 3 3   Language- name 2 objects 2 2   Language- repeat 1 1  Language- follow 3 step command 3 3   Language- read & follow direction 1 1   Write a sentence 1 1   Copy design 0 1   Total score 29 30      Data saved with a previous flowsheet row definition      07/23/2023    4:00 PM  Montreal Cognitive Assessment   Visuospatial/ Executive (0/5) 2  Naming (0/3) 2  Attention: Read list of digits (0/2) 2  Attention: Read list of letters (0/1) 1  Attention: Serial 7 subtraction starting at 100 (  0/3) 3  Language: Repeat phrase (0/2) 1  Language : Fluency (0/1) 0  Abstraction (0/2) 1  Delayed Recall (0/5) 4  Orientation (0/6) 6  Total 22  Adjusted Score (based on education) 22      Objective:    Neurological Exam:    VITALS:   Vitals:   04/07/24 1123  BP: 138/74  Pulse: 74  Resp: 20  SpO2: 97%  Weight: 166 lb (75.3 kg)  Height: 4' 9 (1.448 m)    GEN:  The patient appears stated age and is in NAD. HEENT:  Normocephalic, atraumatic.   Neurological examination:  General: NAD, well-groomed, appears stated age. Orientation: The patient is alert. Oriented to person, place and not to date Cranial nerves: There is good facial symmetry.flat affect the speech is fluent and clear, tangential at times. No aphasia or dysarthria. Fund of knowledge is appropriate. Recent memory impaired, remote memory fair. Attention and concentration are reduced. Able to name objects and unable to repeat phrases.  Hearing is intact to conversational tone.   Sensation: Sensation is intact to light touch throughout Motor: Strength is at least antigravity x4. DTR's 1/4 in UE/LE     Movement examination:  Tone: There is normal tone in the UE/LE Abnormal movements:  no tremor.  No myoclonus.   No asterixis.   Coordination:  There is no decremation with RAM's. Normal finger to nose  Gait and Station: The patient has no difficulty arising out of a deep-seated chair without the use of the hands. The patient's stride length is short gait is cautious and narrow.    Thank you for allowing us  the opportunity to participate in the care of this nice patient. Please do not hesitate to contact us  for any questions or concerns.   Total time spent on today's visit was 32 minutes dedicated to this patient today, preparing to see patient, examining the patient, ordering tests and/or medications and counseling the patient, documenting clinical information in the EHR or other health record, independently interpreting results and communicating results to the patient/family, discussing treatment and goals, answering patient's questions and coordinating care.  Cc:  Joshua Debby CROME, MD  Camie Sevin 04/07/2024 12:41 PM      "

## 2024-04-07 ENCOUNTER — Ambulatory Visit: Payer: Medicare (Managed Care) | Admitting: Physician Assistant

## 2024-04-07 ENCOUNTER — Telehealth: Payer: Self-pay | Admitting: Family Medicine

## 2024-04-07 ENCOUNTER — Encounter: Payer: Self-pay | Admitting: Physician Assistant

## 2024-04-07 VITALS — BP 138/74 | HR 74 | Resp 20 | Ht <= 58 in | Wt 166.0 lb

## 2024-04-07 DIAGNOSIS — R413 Other amnesia: Secondary | ICD-10-CM

## 2024-04-07 NOTE — Telephone Encounter (Signed)
 Patient called to schedule for injections this Thursday. Confirming patient is able to get injections/ which injections and to confirm the 12:30 appointment is okay. FYI

## 2024-04-07 NOTE — Patient Instructions (Addendum)
 August 4 at 11:30  Continue the medicines as prescribed

## 2024-04-07 NOTE — Telephone Encounter (Signed)
 Zilretta  - in stock for visit.

## 2024-04-09 ENCOUNTER — Ambulatory Visit (INDEPENDENT_AMBULATORY_CARE_PROVIDER_SITE_OTHER): Payer: Medicare (Managed Care) | Admitting: Family Medicine

## 2024-04-09 ENCOUNTER — Other Ambulatory Visit: Payer: Self-pay

## 2024-04-09 DIAGNOSIS — M170 Bilateral primary osteoarthritis of knee: Secondary | ICD-10-CM

## 2024-04-09 MED ORDER — TRIAMCINOLONE ACETONIDE 32 MG IX SRER
32.0000 mg | Freq: Once | INTRA_ARTICULAR | Status: AC
  Administered 2024-04-09: 32 mg via INTRA_ARTICULAR

## 2024-04-09 NOTE — Progress Notes (Signed)
" °  Zilretta  injection bilateral knee Procedure: Real-time Ultrasound Guided Injection of right knee joint superior lateral patellar space Device: Philips Affiniti 50G Images permanently stored and available for review in PACS Verbal informed consent obtained.  Discussed risks and benefits of procedure. Warned about infection, hyperglycemia bleeding, damage to structures among others. Patient expresses understanding and agreement Time-out conducted.   Noted no overlying erythema, induration, or other signs of local infection.   Skin prepped in a sterile fashion.   Local anesthesia: Topical Ethyl chloride.   With sterile technique and under real time ultrasound guidance: Zilretta  32 mg injected into knee joint. Fluid seen entering the joint capsule.   Completed without difficulty   Advised to call if fevers/chills, erythema, induration, drainage, or persistent bleeding.   Images permanently stored and available for review in the ultrasound unit.  Impression: Technically successful ultrasound guided injection.   Procedure: Real-time Ultrasound Guided Injection of left knee joint superior lateral patellar space Device: Philips Affiniti 50G Images permanently stored and available for review in PACS Verbal informed consent obtained.  Discussed risks and benefits of procedure. Warned about infection, hyperglycemia bleeding, damage to structures among others. Patient expresses understanding and agreement Time-out conducted.   Noted no overlying erythema, induration, or other signs of local infection.   Skin prepped in a sterile fashion.   Local anesthesia: Topical Ethyl chloride.   With sterile technique and under real time ultrasound guidance: Zilretta  32 mg injected into knee joint. Fluid seen entering the joint capsule.   Completed without difficulty   Advised to call if fevers/chills, erythema, induration, drainage, or persistent bleeding.   Images permanently stored and available for review  in the ultrasound unit.  Impression: Technically successful ultrasound guided injection.  Lot number: [id number]  "

## 2024-04-09 NOTE — Progress Notes (Deleted)
"       ° °  LILLETTE Ileana Collet, PhD, LAT, ATC acting as a scribe for Artist Lloyd, MD.  [name] is a 84 y.o. female who presents to [facility] at [facility] [place] today for exacerbation of her bilateral knee pain. Pt was last seen by Dr. Lloyd on 02/03/24 after suffering a fall and being stranded on the bathroom floor.  Last bilat Zilretta  injections, 12/31/23.  Today, pt reports ***  Dx imaging: 02/03/24 R & L knee XR 08/08/20 R knee XR             01/16/20 L knee XR   Pertinent review of systems: ***  Relevant historical information: ***   Exam:  There were no vitals taken for this visit. General: Well Developed, well nourished, and in no acute distress.   MSK: ***    Lab and Radiology Results No results found for this or any previous visit (from the past 72 hours). No results found.     Assessment and Plan: 84 y.o. female with ***   PDMP not reviewed this encounter. No orders of the defined types were placed in this encounter.  No orders of the defined types were placed in this encounter.    Discussed warning signs or symptoms. Please see discharge instructions. Patient expresses understanding.   ***  "

## 2024-04-09 NOTE — Patient Instructions (Signed)
 Thank you for coming in today.   You received an injection today. Seek immediate medical attention if the joint becomes red, extremely painful, or is oozing fluid.   Check back as needed

## 2024-04-14 ENCOUNTER — Ambulatory Visit (INDEPENDENT_AMBULATORY_CARE_PROVIDER_SITE_OTHER): Admitting: Nurse Practitioner

## 2024-04-15 ENCOUNTER — Ambulatory Visit: Payer: Self-pay

## 2024-04-15 NOTE — Telephone Encounter (Signed)
 Labs were all okay No new abnormalities

## 2024-04-15 NOTE — Telephone Encounter (Signed)
 FYI Only or Action Required?: Action required by provider: request for appointment, clinical question for provider, update on patient condition, and lab or test result follow-up needed.  Patient was last seen in primary care on 04/02/2024 by Joshua Debby CROME, MD.  Called Nurse Triage reporting Neurologic Problem.  Symptoms began a week ago.  Interventions attempted: Nothing.  Symptoms are: gradually worsening.  Triage Disposition: See PCP When Office is Open (Within 3 Days)  Patient/caregiver understands and will follow disposition?: No, wishes to speak with PCP      Reason for Disposition  [1] Numbness or tingling on both sides of body AND [2] is a new symptom present > 24 hours  Answer Assessment - Initial Assessment Questions This RN recommended pt be examined in next 3 days, pt refusing disposition due to transportation, bus only runs Tuesdays and Thursdays, cannot do tomorrow, pt requesting to wait to schedule appt until hear back about A1C results from 1/15 - currently no result note from Dr. Joshua. Pt requesting call back from Ms. Cody. Advised pt call back or seek immediate care if any new or worsening symptoms. Sending message to PCP office for call back to pt with further recommendations.       1. SYMPTOM: What is the main symptom you are concerned about? (e.g., weakness, numbness)     Having tingling in both feet and legs, creating difficulty walking, can still walk since have rolator and cane Pt concerned about A1C results, had been taken off of diabetic med 1-2 years ago, not sure what A1C is now but recent labs completed need feedback from Dr. Joshua  2. ONSET: When did this start? (e.g., minutes, hours, days; while sleeping)     A week ago  3. LAST NORMAL: When was the last time you (the patient) were normal (no symptoms)?     A week ago  4. PATTERN Does this come and go, or has it been constant since it started?  Is it present now?     Comes and  goes  5. CARDIAC SYMPTOMS: Have you had any of the following symptoms: chest pain, difficulty breathing, palpitations?     Denies  6. NEUROLOGIC SYMPTOMS: Have you had any of the following symptoms: headache, dizziness, vision loss, double vision, changes in speech, unsteady on your feet?     Denies  7. OTHER SYMPTOMS: Do you have any other symptoms?     Denies  Denies: Inability to walk, severe weakness New weakness Numbness Changes to speech/awareness Dizziness Vision changes Neck/back pain Chest pain SOB Changes to bowel/bladder Headache  Protocols used: Neurologic Deficit-A-AH

## 2024-04-15 NOTE — Telephone Encounter (Signed)
 Can you please comment on her most recent labs so that I can call and talk to her ?

## 2024-04-17 NOTE — Telephone Encounter (Signed)
 Patient states that she has been having tingling and stinging in bilateral legs and feet she wants to know what this could possibly be?

## 2024-04-17 NOTE — Telephone Encounter (Signed)
 Unable to reach patient. LMTRC

## 2024-04-21 ENCOUNTER — Encounter (INDEPENDENT_AMBULATORY_CARE_PROVIDER_SITE_OTHER): Payer: Self-pay | Admitting: Family Medicine

## 2024-04-21 ENCOUNTER — Ambulatory Visit (INDEPENDENT_AMBULATORY_CARE_PROVIDER_SITE_OTHER): Admitting: Family Medicine

## 2024-04-21 VITALS — BP 170/92 | HR 67 | Temp 98.1°F | Ht <= 58 in | Wt 161.0 lb

## 2024-04-21 DIAGNOSIS — E559 Vitamin D deficiency, unspecified: Secondary | ICD-10-CM

## 2024-04-21 DIAGNOSIS — E1159 Type 2 diabetes mellitus with other circulatory complications: Secondary | ICD-10-CM

## 2024-04-21 DIAGNOSIS — I5032 Chronic diastolic (congestive) heart failure: Secondary | ICD-10-CM

## 2024-04-21 DIAGNOSIS — R7989 Other specified abnormal findings of blood chemistry: Secondary | ICD-10-CM

## 2024-04-21 DIAGNOSIS — E66811 Obesity, class 1: Secondary | ICD-10-CM

## 2024-04-21 DIAGNOSIS — F341 Dysthymic disorder: Secondary | ICD-10-CM

## 2024-04-21 DIAGNOSIS — Z6834 Body mass index (BMI) 34.0-34.9, adult: Secondary | ICD-10-CM

## 2024-04-21 DIAGNOSIS — E1169 Type 2 diabetes mellitus with other specified complication: Secondary | ICD-10-CM

## 2024-04-21 DIAGNOSIS — E1122 Type 2 diabetes mellitus with diabetic chronic kidney disease: Secondary | ICD-10-CM

## 2024-04-21 NOTE — Assessment & Plan Note (Addendum)
 Patient is managed by cardiology.  She is non heart failure medication combination for treatment- beta blocker, jardiance , ARB,.

## 2024-04-22 LAB — COMPREHENSIVE METABOLIC PANEL WITH GFR
ALT: 35 [IU]/L — ABNORMAL HIGH (ref 0–32)
AST: 26 [IU]/L (ref 0–40)
Albumin: 4.4 g/dL (ref 3.7–4.7)
Alkaline Phosphatase: 80 [IU]/L (ref 48–129)
BUN/Creatinine Ratio: 20 (ref 12–28)
BUN: 17 mg/dL (ref 8–27)
Bilirubin Total: 0.8 mg/dL (ref 0.0–1.2)
CO2: 19 mmol/L — ABNORMAL LOW (ref 20–29)
Calcium: 8.9 mg/dL (ref 8.7–10.3)
Chloride: 104 mmol/L (ref 96–106)
Creatinine, Ser: 0.85 mg/dL (ref 0.57–1.00)
Globulin, Total: 2.5 g/dL (ref 1.5–4.5)
Glucose: 78 mg/dL (ref 70–99)
Potassium: 3.9 mmol/L (ref 3.5–5.2)
Sodium: 140 mmol/L (ref 134–144)
Total Protein: 6.9 g/dL (ref 6.0–8.5)
eGFR: 68 mL/min/{1.73_m2}

## 2024-04-22 LAB — TSH: TSH: 5.16 u[IU]/mL — ABNORMAL HIGH (ref 0.450–4.500)

## 2024-04-22 LAB — T4, FREE: Free T4: 1.2 ng/dL (ref 0.82–1.77)

## 2024-04-22 LAB — VITAMIN D 25 HYDROXY (VIT D DEFICIENCY, FRACTURES): Vit D, 25-Hydroxy: 20.2 ng/mL — ABNORMAL LOW (ref 30.0–100.0)

## 2024-04-22 LAB — INSULIN, RANDOM: INSULIN: 9.1 u[IU]/mL (ref 2.6–24.9)

## 2024-04-23 ENCOUNTER — Ambulatory Visit: Admitting: Primary Care

## 2024-04-23 ENCOUNTER — Encounter: Payer: Self-pay | Admitting: Primary Care

## 2024-04-23 VITALS — BP 130/80 | HR 61 | Temp 98.7°F | Ht 59.0 in | Wt 165.2 lb

## 2024-04-23 DIAGNOSIS — J9611 Chronic respiratory failure with hypoxia: Secondary | ICD-10-CM

## 2024-04-23 NOTE — Patient Instructions (Addendum)
 To reduce noise from an oxygen  concentrator, place the unit on a carpeted floor or anti-vibration mat to dampen vibrations, keep it several inches away from walls to prevent sound reflection, and ensure the intake filter is clean. Using longer tubing allows you to move the device to a more distant, well-ventilated area.   Tips to Minimize Oxygen  Concentrator Noise: Vibration Reduction: Place the concentrator on a thick rug, carpet, or rubber mat, rather than hard, resonant surfaces like wood or tile. Optimal Placement: Position the machine at least 1-2 feet away from walls and corners to prevent sound amplification.  Maintenance: Clean the air intake filter regularly (typically weekly) to prevent the motor from working harder and becoming louder.  Sound Barriers: Use acoustic foam or a sound-dampening enclosure, ensuring the unit is not blocked and has proper airflow to prevent overheating.  Longer Tubing: Use longer oxygen  tubing (e.g., 25-50 feet) to move the noisy unit to another room, such as a hallway or laundry room, while keeping it in a well-ventilated area.   _____________________________________________________________________________________  VISIT SUMMARY: Today, we discussed your oxygen  therapy needs and evaluated your current situation. You expressed concerns about the noise of your current oxygen  concentrator and your desire to discontinue oxygen  therapy if possible. We reviewed your oxygen  levels and discussed the next steps to ensure your health and comfort.  YOUR PLAN: -CHRONIC HYPOXEMIC RESPIRATORY FAILURE: Chronic hypoxemic respiratory failure means that your body is not getting enough oxygen , especially during moderate exertion. We need to confirm if you require oxygen  therapy at night. We have ordered an overnight pulse oximetry test to check your oxygen  levels while you sleep. You should continue using oxygen  during moderate exertion. We discussed ways to reduce the noise of  your current concentrator, such as using an anti-vibration mat. If needed, you have a prescription for a portable oxygen  concentrator, but it should not be used at night due to its pulsed oxygen  delivery and noise.  INSTRUCTIONS: Please complete the overnight pulse oximetry test as ordered. Continue using oxygen  during moderate exertion. Consider using an anti-vibration mat to reduce the noise of your current concentrator or placing concentrator in another room or further away from your bed. If you need a portable oxygen  concentrator, use it cautiously and avoid using it at night.  Follow-up 3-6 months with Dr. Neda Mow text generated by [employer].

## 2024-04-23 NOTE — Progress Notes (Unsigned)
 "  @Patient  ID: [name], female    DOB: May 01, 1940, 84 y.o.   MRN: [id number]  No chief complaint on file.   Referring provider: Joshua Debby CROME, MD  HPI: 84 year old female, former smoker. PMH significant for OSA, chronic respiratory failure, HTN, chronic heart failure, GERD, type 2 diabetes, moderate alzheimer's dementia, stage 3b CKD, hyperlipidemia, GAD. Patient of Dr. Neda, last seen in November 2024.   04/23/2024- POC qualification  Followed by our office for mild OSA not on CPAP and chronic respiratory failure. She was last seen in November 2024, during that visit she qualified for supplemental oxygen . She was noted to desaturated in low 80s requiring 4L oxygen  to maintain O2 >90%. Patient presents today for POC qualification.   Discussed the use of AI scribe software for clinical note transcription with the patient, who gave verbal consent to proceed.  History of Present Illness [name] is an 84 year old female who presents for evaluation of her oxygen  therapy needs.  She is seeking to qualify for a portable oxygen  concentrator due to the noise of her current home unit, which she finds disruptive, particularly at night. She does not use oxygen  continuously but wants it available when she overexerts herself, which is not frequent.  Her oxygen  levels drop with moderate exertion. She has not used oxygen  for about a month and a half and is interested in discontinuing it if possible, primarily to stop paying for it. She uses the portable concentrator set at three liters when needed.  She lives in a senior citizen home and mentions that her meals are often brought to her. She does not sleep well at night and tends to sleep during the day.  No current respiratory symptoms, and she denies snoring, waking up gasping or choking.   Allergies[1]  Immunization History  Administered Date(s) Administered   Fluad Quad(high Dose 65+) 01/19/2020   Fluad Trivalent(High Dose 65+)  11/29/2022   INFLUENZA, HIGH DOSE SEASONAL PF 12/04/2013, 01/18/2022, 12/31/2023   Influenza Split 12/15/2010, 01/24/2012   Influenza Whole 01/07/2006, 01/15/2007, 12/18/2007, 12/16/2008, 01/24/2010   Influenza,inj,Quad PF,6+ Mos 12/08/2012   Influenza-Unspecified 01/08/2016, 01/17/2021   Moderna Sars-Covid-2 Vaccination 03/30/2019, 04/27/2019, 01/14/2020   Pfizer(Comirnaty )Fall Seasonal Vaccine 12 years and older 01/22/2023   Pneumococcal Conjugate-13 12/04/2013   Pneumococcal Polysaccharide-23 12/16/2008, 07/24/2021   Td 04/25/2009   Zoster Recombinant(Shingrix ) 08/26/2021, 01/18/2022   Zoster, Live 12/04/2013    Past Medical History:  Diagnosis Date   Allergic rhinitis    Anxiety    Arthritis    Chest pain    Chronic cough    Dr Darlean, onset 11/2007, Sinus CT July 7,2010>neg, Resolved 10/2008 on ppi two times a day   Colon polyp    Depression    Diabetes mellitus type II    diet   Dyslipidemia    Hiatal hernia    Hypertension    Hypothyroidism    Joint pain    Lung nodule    Multiple food allergies    Obesity (BMI 30-39.9)    Pneumonia    Sleep apnea    Dr Corrie CPAP 2009   SOB (shortness of breath)     Tobacco History: Tobacco Use History[2] Counseling given: Not Answered   Outpatient Medications Prior to Visit  Medication Sig Dispense Refill   acetaminophen  (TYLENOL ) 500 MG tablet Take 500 mg by mouth as needed for mild pain.     albuterol  (VENTOLIN  HFA) 108 (90 Base) MCG/ACT inhaler TAKE 2 PUFFS BY  MOUTH EVERY 6 HOURS AS NEEDED FOR WHEEZE OR SHORTNESS OF BREATH 8.5 each 0   AMBULATORY NON FORMULARY MEDICATION Folding walker with front wheels.  Use as needed. Disp 1 R26.89 1 each 0   Blood Glucose Calibration (ACCU-CHEK GUIDE CONTROL) LIQD 1 Act by In Vitro route 2 (two) times daily as needed. 1 each 3   Blood Glucose Monitoring Suppl (ACCU-CHEK GUIDE ME) w/Device KIT 1 Act by Does not apply route 2 (two) times daily as needed. 2 kit 2   carvedilol  (COREG )  3.125 MG tablet Take 1 tablet (3.125 mg total) by mouth 2 (two) times daily with a meal. 180 tablet 0   clobetasol  ointment (TEMOVATE ) 0.05 % Apply topically as needed (irritation).     diclofenac  Sodium (VOLTAREN ) 1 % GEL Apply 4 g topically 4 (four) times daily. 100 g 5   empagliflozin  (JARDIANCE ) 10 MG TABS tablet Take 1 tablet (10 mg total) by mouth daily before breakfast. 90 tablet 0   estradiol (ESTRACE) 0.1 MG/GM vaginal cream Place 1 Applicatorful vaginally daily.     FLUoxetine  (PROZAC ) 40 MG capsule Take 1 capsule (40 mg total) by mouth daily. 90 capsule 0   glucose blood (ACCU-CHEK GUIDE) test strip 1 each by Other route 2 (two) times daily. 300 each 1   levothyroxine  (SYNTHROID ) 50 MCG tablet Take 1 tablet (50 mcg total) by mouth daily. 100 tablet 0   losartan  (COZAAR ) 100 MG tablet Take 1 tablet (100 mg total) by mouth daily. 90 tablet 0   memantine  (NAMENDA  XR) 28 MG CP24 24 hr capsule Take 1 capsule (28 mg total) by mouth daily. 90 capsule 1   nitroGLYCERIN  (NITROSTAT ) 0.4 MG SL tablet Place 1 tablet (0.4 mg total) under the tongue every 5 (five) minutes as needed for chest pain. 50 tablet 0   rosuvastatin  (CRESTOR ) 10 MG tablet Take 1 tablet (10 mg total) by mouth daily. 100 tablet 0   torsemide  (DEMADEX ) 20 MG tablet Take 1 tablet (20 mg total) by mouth daily. (Patient taking differently: Take 20 mg by mouth daily. Prn) 90 tablet 0   No facility-administered medications prior to visit.   Review of Systems  Review of Systems  Constitutional: Negative.   Respiratory: Negative.     Physical Exam  There were no vitals taken for this visit. Physical Exam Constitutional:      Appearance: Normal appearance. She is well-developed.  HENT:     Head: Normocephalic and atraumatic.     Mouth/Throat:     Mouth: Mucous membranes are moist.     Pharynx: Oropharynx is clear.  Eyes:     Pupils: Pupils are equal, round, and reactive to light.  Cardiovascular:     Rate and Rhythm:  Normal rate and regular rhythm.     Heart sounds: Normal heart sounds. No murmur heard. Pulmonary:     Effort: Pulmonary effort is normal. No respiratory distress.     Breath sounds: Normal breath sounds. No wheezing or rhonchi.  Musculoskeletal:        General: Normal range of motion.     Cervical back: Normal range of motion and neck supple.  Skin:    General: Skin is warm and dry.     Findings: No erythema or rash.  Neurological:     General: No focal deficit present.     Mental Status: She is alert and oriented to person, place, and time. Mental status is at baseline.  Psychiatric:  Mood and Affect: Mood normal.        Behavior: Behavior normal.        Thought Content: Thought content normal.        Judgment: Judgment normal.     Lab Results:  CBC    Component Value Date/Time   WBC 7.9 04/02/2024 1207   RBC 4.61 04/02/2024 1207   HGB 14.4 04/02/2024 1207   HCT 43.0 04/02/2024 1207   PLT 245.0 04/02/2024 1207   MCV 93.3 04/02/2024 1207   MCH 30.0 02/24/2023 1547   MCHC 33.6 04/02/2024 1207   RDW 14.7 04/02/2024 1207   LYMPHSABS 1.9 04/02/2024 1207   MONOABS 0.9 04/02/2024 1207   EOSABS 0.1 04/02/2024 1207   BASOSABS 0.0 04/02/2024 1207    BMET    Component Value Date/Time   NA 140 04/21/2024 1504   K 3.9 04/21/2024 1504   CL 104 04/21/2024 1504   CO2 19 (L) 04/21/2024 1504   GLUCOSE 78 04/21/2024 1504   GLUCOSE 104 (H) 04/02/2024 1207   BUN 17 04/21/2024 1504   CREATININE 0.85 04/21/2024 1504   CALCIUM  8.9 04/21/2024 1504   GFRNONAA >60 02/24/2023 1547   GFRAA  06/28/2009 0500    >60        The eGFR has been calculated using the MDRD equation. This calculation has not been validated in all clinical situations. eGFR's persistently <60 mL/min signify possible Chronic Kidney Disease.    BNP    Component Value Date/Time   BNP 362.0 (H) 12/21/2022 2230    ProBNP    Component Value Date/Time   PROBNP 180.0 (H) 09/03/2022 1124     Imaging: US  LIMITED JOINT SPACE STRUCTURES LOW BILAT(NO LINKED CHARGES) Result Date: 04/16/2024 Zilretta  injection bilateral knee Procedure: Real-time Ultrasound Guided Injection of right knee joint superior lateral patellar space Device: Philips Affiniti 50G Images permanently stored and available for review in PACS Verbal informed consent obtained.  Discussed risks and benefits of procedure. Warned about infection, hyperglycemia bleeding, damage to structures among others. Patient expresses understanding and agreement Time-out conducted.  Noted no overlying erythema, induration, or other signs of local infection.  Skin prepped in a sterile fashion.  Local anesthesia: Topical Ethyl chloride.  With sterile technique and under real time ultrasound guidance: Zilretta  32 mg injected into knee joint. Fluid seen entering the joint capsule.  Completed without difficulty  Advised to call if fevers/chills, erythema, induration, drainage, or persistent bleeding.  Images permanently stored and available for review in the ultrasound unit. Impression: Technically successful ultrasound guided injection.   Procedure: Real-time Ultrasound Guided Injection of left knee joint superior lateral patellar space Device: Philips Affiniti 50G Images permanently stored and available for review in PACS Verbal informed consent obtained.  Discussed risks and benefits of procedure. Warned about infection, hyperglycemia bleeding, damage to structures among others. Patient expresses understanding and agreement Time-out conducted.  Noted no overlying erythema, induration, or other signs of local infection.  Skin prepped in a sterile fashion.  Local anesthesia: Topical Ethyl chloride.  With sterile technique and under real time ultrasound guidance: Zilretta  32 mg injected into knee joint. Fluid seen entering the joint capsule.  Completed without difficulty  Advised to call if fevers/chills, erythema, induration, drainage, or persistent  bleeding.  Images permanently stored and available for review in the ultrasound unit. Impression: Technically successful ultrasound guided injection.  Lot number: [id number]      Assessment & Plan:   1. Chronic hypoxemic respiratory failure (HCC) (Primary)  Assessment and  Plan Assessment & Plan Chronic hypoxemic respiratory failure Requiring oxygen  therapy, particularly with moderate exertion. Oxygen  saturation drops to 86-88% with exertion. She desires to discontinue oxygen  therapy due to cost and inconvenience, but requires confirmation of nocturnal oxygen  needs. Portable oxygen  concentrator is unsuitable for nighttime use due to pulsed oxygen  delivery and noise. - Ordered overnight pulse oximetry test to assess nocturnal oxygen  levels. - Advised use of oxygen  with moderate exertion. - Discussed noise reduction strategies for current concentrator, such as using an anti-vibration mat. - Provided prescription for portable oxygen  concentrator if needed, with caution against nighttime use.  Vitals:   04/23/24 1103  BP: 130/80  Pulse: 61  Temp: 98.7 F (37.1 C)  Height: 4' 11 (1.499 m)  Weight: 165 lb 3.2 oz (74.9 kg)  SpO2: 99% Comment: RA  TempSrc: Oral  BMI (Calculated): 33.35   Recording duration: 9 minutes   Gina LELON Ferrari, NP 04/23/2024     [1]  Allergies Allergen Reactions   Shellfish Protein-Containing Drug Products Anaphylaxis   Amoxicillin  Other (See Comments)    GI Issues.   Codeine Sulfate    Hydrochlorothiazide    Iodine    Iohexol       Code: HIVES, Desc: PATIENT STATES SHE BREAKS OUT IN HIVES FROM IV DYE 02/27/08/RM, Onset Date: [id number]    Paroxetine    Penicillins    Prednisone     Red Dye #28 (Non-Screening) Other (See Comments)  [2]  Social History Tobacco Use  Smoking Status Former   Current packs/day: 0.00   Average packs/day: 1.5 packs/day for 25.0 years (37.5 ttl pk-yrs)   Types: Cigarettes   Start date: 10/28/1956   Quit date: 10/28/1981    Years since quitting: 42.5  Smokeless Tobacco Never   "

## 2024-04-28 ENCOUNTER — Ambulatory Visit (INDEPENDENT_AMBULATORY_CARE_PROVIDER_SITE_OTHER): Admitting: Nurse Practitioner

## 2024-05-12 ENCOUNTER — Ambulatory Visit (INDEPENDENT_AMBULATORY_CARE_PROVIDER_SITE_OTHER): Admitting: Family Medicine

## 2024-07-01 ENCOUNTER — Ambulatory Visit: Payer: Medicare (Managed Care) | Admitting: Internal Medicine

## 2024-07-02 ENCOUNTER — Ambulatory Visit: Payer: Medicare (Managed Care) | Admitting: Internal Medicine

## 2024-07-02 ENCOUNTER — Ambulatory Visit: Payer: Medicare (Managed Care) | Admitting: Family Medicine

## 2024-07-28 ENCOUNTER — Ambulatory Visit: Admitting: Pulmonary Disease

## 2024-10-20 ENCOUNTER — Ambulatory Visit: Payer: Self-pay | Admitting: Physician Assistant

## 2025-03-15 ENCOUNTER — Ambulatory Visit

## 2025-03-15 ENCOUNTER — Encounter: Admitting: Internal Medicine
